# Patient Record
Sex: Male | Born: 1945 | Race: White | Hispanic: No | Marital: Married | Smoking: Former smoker
Health system: Southern US, Community
[De-identification: ages and names within clinical notes are randomized; demographics above are authoritative.]

## PROBLEM LIST (undated history)

## (undated) DIAGNOSIS — E291 Testicular hypofunction: Secondary | ICD-10-CM

## (undated) DIAGNOSIS — J449 Chronic obstructive pulmonary disease, unspecified: Secondary | ICD-10-CM

## (undated) DIAGNOSIS — K31819 Angiodysplasia of stomach and duodenum without bleeding: Secondary | ICD-10-CM

## (undated) DIAGNOSIS — R195 Other fecal abnormalities: Secondary | ICD-10-CM

## (undated) DIAGNOSIS — K559 Vascular disorder of intestine, unspecified: Secondary | ICD-10-CM

## (undated) DIAGNOSIS — N2889 Other specified disorders of kidney and ureter: Secondary | ICD-10-CM

## (undated) DIAGNOSIS — H9190 Unspecified hearing loss, unspecified ear: Secondary | ICD-10-CM

## (undated) DIAGNOSIS — I259 Chronic ischemic heart disease, unspecified: Secondary | ICD-10-CM

## (undated) DIAGNOSIS — E669 Obesity, unspecified: Secondary | ICD-10-CM

## (undated) DIAGNOSIS — D649 Anemia, unspecified: Secondary | ICD-10-CM

## (undated) DIAGNOSIS — I4821 Permanent atrial fibrillation: Secondary | ICD-10-CM

## (undated) DIAGNOSIS — D122 Benign neoplasm of ascending colon: Secondary | ICD-10-CM

## (undated) DIAGNOSIS — J189 Pneumonia, unspecified organism: Secondary | ICD-10-CM

## (undated) DIAGNOSIS — Z950 Presence of cardiac pacemaker: Secondary | ICD-10-CM

## (undated) DIAGNOSIS — I2510 Atherosclerotic heart disease of native coronary artery without angina pectoris: Secondary | ICD-10-CM

## (undated) DIAGNOSIS — N289 Disorder of kidney and ureter, unspecified: Secondary | ICD-10-CM

## (undated) DIAGNOSIS — G4730 Sleep apnea, unspecified: Secondary | ICD-10-CM

## (undated) DIAGNOSIS — E042 Nontoxic multinodular goiter: Secondary | ICD-10-CM

## (undated) DIAGNOSIS — E782 Mixed hyperlipidemia: Secondary | ICD-10-CM

## (undated) DIAGNOSIS — I10 Essential (primary) hypertension: Secondary | ICD-10-CM

## (undated) DIAGNOSIS — M1990 Unspecified osteoarthritis, unspecified site: Secondary | ICD-10-CM

## (undated) DIAGNOSIS — H269 Unspecified cataract: Secondary | ICD-10-CM

## (undated) DIAGNOSIS — K279 Peptic ulcer, site unspecified, unspecified as acute or chronic, without hemorrhage or perforation: Secondary | ICD-10-CM

## (undated) DIAGNOSIS — K5520 Angiodysplasia of colon without hemorrhage: Secondary | ICD-10-CM

## (undated) HISTORY — PX: SHOULDER SURGERY: SHX246

## (undated) HISTORY — DX: Chronic obstructive pulmonary disease, unspecified: J44.9

## (undated) HISTORY — DX: Disorder of kidney and ureter, unspecified: N28.9

## (undated) HISTORY — DX: Atherosclerotic heart disease of native coronary artery without angina pectoris: I25.10

## (undated) HISTORY — DX: Testicular hypofunction: E29.1

## (undated) HISTORY — PX: EYE SURGERY: SHX253

## (undated) HISTORY — DX: Benign neoplasm of ascending colon: D12.2

## (undated) HISTORY — PX: CHOLECYSTECTOMY: SHX55

## (undated) HISTORY — DX: Angiodysplasia of stomach and duodenum without bleeding: K31.819

## (undated) HISTORY — PX: ESOPHAGOGASTRODUODENOSCOPY: SHX1529

## (undated) HISTORY — DX: Other fecal abnormalities: R19.5

## (undated) HISTORY — DX: Other specified disorders of kidney and ureter: N28.89

## (undated) HISTORY — DX: Obesity, unspecified: E66.9

## (undated) HISTORY — DX: Essential (primary) hypertension: I10

## (undated) HISTORY — DX: Angiodysplasia of colon without hemorrhage: K55.20

## (undated) HISTORY — DX: Nontoxic multinodular goiter: E04.2

## (undated) HISTORY — DX: Mixed hyperlipidemia: E78.2

## (undated) HISTORY — DX: Peptic ulcer, site unspecified, unspecified as acute or chronic, without hemorrhage or perforation: K27.9

## (undated) HISTORY — PX: KNEE DEBRIDEMENT: SHX1894

## (undated) HISTORY — DX: Vascular disorder of intestine, unspecified: K55.9

## (undated) HISTORY — DX: Chronic ischemic heart disease, unspecified: I25.9

## (undated) HISTORY — PX: TONSILECTOMY, ADENOIDECTOMY, BILATERAL MYRINGOTOMY AND TUBES: SHX2538

## (undated) HISTORY — PX: WISDOM TOOTH EXTRACTION: SHX21

## (undated) HISTORY — DX: Anemia, unspecified: D64.9

## (undated) HISTORY — DX: Permanent atrial fibrillation: I48.21

---

## 2005-12-24 HISTORY — PX: CORONARY ARTERY BYPASS GRAFT: SHX141

## 2007-01-20 ENCOUNTER — Inpatient Hospital Stay (HOSPITAL_BASED_OUTPATIENT_CLINIC_OR_DEPARTMENT_OTHER): Admission: RE | Admit: 2007-01-20 | Discharge: 2007-01-20 | Payer: Self-pay | Admitting: Cardiology

## 2007-01-30 ENCOUNTER — Ambulatory Visit: Payer: Self-pay | Admitting: Cardiothoracic Surgery

## 2007-02-04 ENCOUNTER — Inpatient Hospital Stay (HOSPITAL_COMMUNITY): Admission: RE | Admit: 2007-02-04 | Discharge: 2007-02-11 | Payer: Self-pay | Admitting: Cardiothoracic Surgery

## 2007-02-04 ENCOUNTER — Ambulatory Visit: Payer: Self-pay | Admitting: Cardiothoracic Surgery

## 2007-02-24 ENCOUNTER — Emergency Department (HOSPITAL_COMMUNITY): Admission: EM | Admit: 2007-02-24 | Discharge: 2007-02-24 | Payer: Self-pay | Admitting: Emergency Medicine

## 2007-03-05 ENCOUNTER — Encounter: Admission: RE | Admit: 2007-03-05 | Discharge: 2007-03-05 | Payer: Self-pay | Admitting: Cardiothoracic Surgery

## 2007-03-05 ENCOUNTER — Ambulatory Visit: Payer: Self-pay | Admitting: Cardiothoracic Surgery

## 2008-01-21 ENCOUNTER — Ambulatory Visit: Payer: Self-pay | Admitting: Cardiothoracic Surgery

## 2008-01-28 ENCOUNTER — Observation Stay (HOSPITAL_COMMUNITY): Admission: AD | Admit: 2008-01-28 | Discharge: 2008-01-29 | Payer: Self-pay | Admitting: Cardiothoracic Surgery

## 2008-01-28 ENCOUNTER — Ambulatory Visit: Payer: Self-pay | Admitting: Cardiothoracic Surgery

## 2008-02-19 ENCOUNTER — Ambulatory Visit: Payer: Self-pay | Admitting: Cardiothoracic Surgery

## 2010-05-04 ENCOUNTER — Ambulatory Visit: Payer: Self-pay | Admitting: Cardiovascular Disease

## 2010-05-18 ENCOUNTER — Telehealth: Payer: Self-pay | Admitting: Cardiovascular Disease

## 2010-05-23 ENCOUNTER — Telehealth: Payer: Self-pay | Admitting: Cardiovascular Disease

## 2010-05-30 ENCOUNTER — Telehealth: Payer: Self-pay | Admitting: Cardiovascular Disease

## 2010-08-10 ENCOUNTER — Telehealth (INDEPENDENT_AMBULATORY_CARE_PROVIDER_SITE_OTHER): Payer: Self-pay | Admitting: *Deleted

## 2010-08-10 ENCOUNTER — Ambulatory Visit: Payer: Self-pay | Admitting: Cardiovascular Disease

## 2010-08-14 ENCOUNTER — Telehealth (INDEPENDENT_AMBULATORY_CARE_PROVIDER_SITE_OTHER): Payer: Self-pay | Admitting: *Deleted

## 2010-08-15 ENCOUNTER — Ambulatory Visit: Payer: Self-pay

## 2010-08-15 ENCOUNTER — Encounter (INDEPENDENT_AMBULATORY_CARE_PROVIDER_SITE_OTHER): Payer: Self-pay | Admitting: *Deleted

## 2010-08-15 ENCOUNTER — Encounter: Payer: Self-pay | Admitting: Cardiovascular Disease

## 2010-08-15 ENCOUNTER — Ambulatory Visit: Payer: Self-pay | Admitting: Cardiovascular Disease

## 2010-08-15 ENCOUNTER — Encounter (HOSPITAL_COMMUNITY): Admission: RE | Admit: 2010-08-15 | Discharge: 2010-09-14 | Payer: Self-pay | Admitting: Dermatopathology

## 2010-10-25 ENCOUNTER — Telehealth (INDEPENDENT_AMBULATORY_CARE_PROVIDER_SITE_OTHER): Payer: Self-pay | Admitting: *Deleted

## 2011-01-23 NOTE — Assessment & Plan Note (Signed)
Summary: Cardiology Nuclear Testing  Nuclear Med Background Indications for Stress Test: Evaluation for Ischemia, Graft Patency   History: CABG, Heart Catheterization, Myocardial Perfusion Study  History Comments: '08 [id number], inferior ischemia>CABG x 4  Symptoms: DOE, Palpitations    Nuclear Pre-Procedure Cardiac Risk Factors: History of Smoking, Hypertension, Lipids, Obesity Caffeine/Decaff Intake: None NPO After: 8:00 PM Lungs: Clear IV 0.9% NS with Angio Cath: 22g     IV Site: (R) AC IV Started by: Irven Baltimore, RN Chest Size (in) 52     Height (in): 72 Weight (lb): 258 BMI: 35.12 Tech Comments: Held metoprolol x 24 hours.  Nuclear Med Study 1 or 2 day study:  1 day     Stress Test Type:  Stress Reading MD:  Jenkins Rouge, MD     Referring MD:  Annia Belt, MD Resting Radionuclide:  Technetium 4mTetrofosmin     Resting Radionuclide Dose:  10.8 mCi  Stress Radionuclide:  Technetium 91metrofosmin     Stress Radionuclide Dose:  33 mCi   Stress Protocol Exercise Time (min):  6:00 min     Max HR:  133 bpm     Predicted Max HR:  15353pm  Max Systolic BP: 23912m Hg     Percent Max HR:  85.26 %     METS: 6.6 Rate Pressure Product:  [id number]  Stress Test Technologist:  ShValetta FullerCMA-N     Nuclear Technologist:  StCharlton AmorCNMT  Rest Procedure  Myocardial perfusion imaging was performed at rest 45 minutes following the intravenous administration of Technetium 9952mtrofosmin.  Stress Procedure  The patient exercised for six minutes.  The patient stopped due to significant DOE with a peak O2 Sat of 98%.  He  denied any chest pain.  There were ST-T wave changes, nonsustained V-tach and occasional PVC's with couplets.  He also had a hypertensive response to exercise, 231/88.  Technetium 57m43mrofosmin was injected at peak exercise and myocardial perfusion imaging was performed after a brief delay.  QPS Raw Data Images:  Normal; no motion artifact; normal  heart/lung ratio. Stress Images:  Normal homogeneous uptake in all areas of the myocardium. Rest Images:  Normal homogeneous uptake in all areas of the myocardium. Subtraction (SDS):  Normal Transient Ischemic Dilatation:  0.93  (Normal <1.22)  Lung/Heart Ratio:  0.46  (Normal <0.45)  Quantitative Gated Spect Images QGS EDV:  98 ml QGS ESV:  44 ml QGS EF:  55 % QGS cine images:  normal  Findings Normal nuclear study      Overall Impression  Exercise Capacity: Poor exercise capacity. BP Response: Hypertensive blood pressure response. Clinical Symptoms: Dysnpnea ECG Impression: 1mm 59mloping ST segment changes in lateral leads not thought to be significant Overall Impression: Normal perfusion.  HTN

## 2011-01-23 NOTE — Progress Notes (Signed)
  Stress Test faxed to Christy/Dr.Redding's Office @ [phone] [facility]  October 25, 2010 11:34 AM

## 2011-01-23 NOTE — Progress Notes (Signed)
  Phone Note Call from Patient   Caller: Patient Summary of Call: Patient stopped by office-requested Viagra 100mg  pills instead of 50mg .  States the 50mg  does not do anything.  Uses [facility] in [place]. Initial call taken by: Dessie Coma,  May 23, 2010 1:56 PM

## 2011-01-23 NOTE — Progress Notes (Signed)
  Phone Note Call from Patient   Summary of Call: Patient stopped by office after receiving a message on answering machine.  Spoke with Dr. Freida Busman, potassium level high.  Needs to have repeat BMP done.  Advised to stop taking the additional potassium supplement.

## 2011-01-23 NOTE — Progress Notes (Signed)
----   Converted from flag ---- ---- 08/10/2010 10:49 AM, Charm Rings, CMA, AAMA wrote: Pt has Medicare and [employer], no precert required.  ---- 08/10/2010 10:11 AM, Rhett Bannister  LPN wrote: Exercise Nuclear scheduled for Tuesday 08/15/10 at [facility]. with Dx: 786.09.  Thanks, Anderson Malta ------------------------------

## 2011-01-23 NOTE — Progress Notes (Signed)
  Phone Note Outgoing Call   Call placed by: Dessie Coma,  May 30, 2010 2:24 PM Call placed to: Patient Summary of Call: Patient notified per Dr. Freida Busman, labs showed improvement in kidney function compared to last time. Initial call taken by: Dessie Coma,  May 30, 2010 2:25 PM

## 2011-01-23 NOTE — Progress Notes (Signed)
Summary: Nuclear pre procedure  Phone Note Outgoing Call Call back at [facility] Phone [phone]   Call placed by: Valetta Fuller, [place],  August 14, 2010 4:21 PM Call placed to: Patient Summary of Call: Reviewed information on [employer] Information Sheet (see scanned document for further details).  Spoke with patient's wife.      Nuclear Med Background Indications for Stress Test: Evaluation for Ischemia, Graft Patency   History: CABG, Heart Catheterization, Myocardial Perfusion Study  History Comments: '08 [id number], inferior ischemia>CABG x 4  Symptoms: DOE    Nuclear Pre-Procedure Cardiac Risk Factors: Hypertension, Lipids

## 2011-01-23 NOTE — Letter (Signed)
Summary: Outpatient Coinsurance Notice  Outpatient Coinsurance Notice   Imported By: Marylou Mccoy 08/25/2010 10:41:28  _____________________________________________________________________  External Attachment:    Type:   Image     Comment:   External Document

## 2011-02-08 ENCOUNTER — Ambulatory Visit (INDEPENDENT_AMBULATORY_CARE_PROVIDER_SITE_OTHER): Payer: Medicare Other | Admitting: Cardiovascular Disease

## 2011-02-08 DIAGNOSIS — E7800 Pure hypercholesterolemia, unspecified: Secondary | ICD-10-CM

## 2011-02-08 DIAGNOSIS — I2510 Atherosclerotic heart disease of native coronary artery without angina pectoris: Secondary | ICD-10-CM

## 2011-02-08 DIAGNOSIS — I10 Essential (primary) hypertension: Secondary | ICD-10-CM

## 2011-02-16 DIAGNOSIS — Z0279 Encounter for issue of other medical certificate: Secondary | ICD-10-CM

## 2011-03-16 NOTE — Assessment & Plan Note (Signed)
[facility] OFFICE NOTE  Damione, [place] MAXIMILIEN HAYASHI                       MRN:          [id number] DATE:02/08/2011                            DOB:          1946-04-22   Mr. Luczak is a 65 year old gentleman who is here today for a followup visit.  He has the following problem list:  1. Coronary artery disease status post 4-vessel coronary artery bypass     graft surgery in 2007. 2. Hypertension. 3. Recently diagnosed type 2 diabetes. 4. Hyperlipidemia. 5. Obesity. 6. Erectile dysfunction.  INTERVAL HISTORY:  Mr. Beeney has been doing reasonably well.  Since his last visit, he was diagnosed with type 2 diabetes when he presented with polydipsia.  He was since started on a glipizide and a metformin.  He has been exercising more than before.  He goes to the [facility] 3 times a week, where he does treadmill, swimming, and weightlifting.  He has not had any recent chest pain.  His dyspnea and exercise capacity has improved significantly over the last 6 months.  PHYSICAL EXAMINATION:  VITAL SIGNS:  Weight is 252.4 pounds, blood pressure is 140/82, pulse is 50, oxygen saturation is 98% on room air. NECK:  Reveals no JVD or carotid bruits. LUNGS:  Clear to auscultation. HEART:  Regular rate and rhythm with no gallops or murmurs. ABDOMEN:  Benign, nontender, nondistended. EXTREMITIES:  With no clubbing, cyanosis, or edema.  IMPRESSION: 1. Coronary artery disease status post CABG in 2007:  He is currently     not having any symptoms of angina, heart failure, or arrhythmia.     His most recent stress test was done in August 2011.  Overall, it     was negative for ischemia with an ejection fraction of 55%.  I will     continue with medical therapy at this time. 2. Hypertension:  Blood pressure is reasonably controlled.  He is     being treated with metoprolol 50 mg twice daily,     hydrochlorothiazide 25 mg once daily, and  amlodipine 5 mg once     daily.  He is not on an ACE inhibitor as it caused worsening of     renal function. 3. Hyperlipidemia:  We will continue with pravastatin as well as     Zetia, he is on 80 mg of pravastatin.  His most recent lipid     profile was acceptable except for low HDL.  Hopefully,     this will improve with exercise.  I will plan on repeating his     lipid profile in 6 months.  I will     consider stopping these 2 medications and switching to 1 potent     statin instead such as simvastatin or atorvastatin.  The patient     will follow up in 6 months or earlier if needed.    Kathlyn Sacramento, MD Electronically Signed   MA/MedQ  DD: 02/08/2011  DT: 02/08/2011  Job #: [id number]

## 2011-04-25 ENCOUNTER — Other Ambulatory Visit: Payer: Self-pay | Admitting: *Deleted

## 2011-05-07 ENCOUNTER — Other Ambulatory Visit: Payer: Self-pay | Admitting: *Deleted

## 2011-05-07 MED ORDER — SILDENAFIL CITRATE 50 MG PO TABS: 50.0000 mg | ORAL_TABLET | Freq: Every day | ORAL | Status: DC | PRN

## 2011-05-08 NOTE — Assessment & Plan Note (Signed)
OFFICE VISIT   Lawrence, [name]  DOB:  July 04, 1946                                        February 19, 2008  CHART #:  [id number]   The patient returns to the office today in follow-up after his recent  incisional/ventral hernia repair, status post coronary artery bypass  grafting done on January 29, 2008.  He has done extremely well  postoperatively.  He is very pleased with the result.  He notes there is  marked decrease in tenderness.  He notes that he is sleeping better and  has not needed any pain medication.   PHYSICAL EXAMINATION:  VITAL SIGNS:  Blood pressure 155/97, he did not  take his blood pressure medicine this morning.  Pulse 67, respiratory  rate 18, O2 saturations 98%.  CHEST:  His sternum is healed.  The new incision over the lower portion  of his sternum is completely healed.  In fact it is almost  indistinguishable from his previous sternotomy incision.  On palpation,  the repair is intact.  There is no palpable fascial defect or evidence  of reherniation.   Overall, he is making very good progress postoperatively.  I have warned  him about any heavy lifting for at least several months and also  reminded him to make sure he takes his blood pressure medication as  indicated.  He notes that he is to see Lawrence [name], M.D. in early  June.  I have not made him a return appointment to see, but would be  glad to see him at Dr. Thurman [name] request or p.r.n.   Lanelle Bal, MD  Electronically Signed   EG/MEDQ  D:  02/19/2008  T:  02/20/2008  Job:  [id number]   cc:   Lawrence [name], M.D.

## 2011-05-08 NOTE — Assessment & Plan Note (Signed)
[facility] OFFICE NOTE   Lawrence, Husmann LAJUAN [name]                       MRN:          [id number]  DATE:08/10/2010                            DOB:          November 03, 1946    This is a followup visit.   PROBLEM LIST:  1. Coronary artery disease status post four-vessel coronary artery      bypass graft in 2007.  2. Hypertension.  3. Hyperlipidemia.  4. Obesity.  5. Erectile dysfunction.   INTERIM HISTORY:  [name] is here today for a followup visit.  Overall, he is doing reasonably well.  He denies any chest pain.  However, has been having episodes of exertional dyspnea which happens  with the moderate activities.  He gets occasional palpitations.  There  is no dizziness, presyncope, or syncope.  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  He  has been taking his medications as prescribed.   Medications include:  1. Prilosec 40 mg twice daily.  2. Zetia 10 mg daily.  3. Pravastatin, he is taking both 10 mg at bedtime and 80 mg at      bedtime.  I asked him to stop that 10 mg.  4. Spiriva once daily.  5. Aspirin 81 mg daily.  6. Fish oil 1000 mg once daily.  7. Testosterone.  8. Metoprolol 50 mg twice daily.  9. Hydrochlorothiazide 25 mg once daily.  10.Amlodipine 5 mg once daily.  11.Nitroglycerin as needed which he has not used.  12.Viagra as needed.   ALLERGIES:  No known drug allergies.   PHYSICAL EXAMINATION:  VITAL SIGNS:  Weight is 260.6 pounds, blood  pressure is 138/81, pulse is 55, oxygen saturation is 94% on room air.  NECK:  No JVD or carotid bruits.  LUNGS:  Clear to auscultation.  HEART:  Regular rate and rhythm with no gallops or murmurs.  ABDOMEN:  Benign, nontender, nondistended.  EXTREMITIES:  With no clubbing, cyanosis, or edema.   IMPRESSION:  1. Coronary artery disease status post coronary artery bypass surgery      in 2007.   Currently, he is having symptoms of exertional dyspnea      and will need to rule out underlying ischemia.  Interestingly, he      never had any chest pain at all even before his bypass surgery.      Most of his symptoms were fatigue and dyspnea.  We will thus      proceed with treadmill nuclear stress test.  In the meanwhile, we      will continue with aggressive treatment of risk factors with      aspirin, metoprolol, ACE inhibitor, and pravastatin.  2. Hypertension.  Blood pressure is better controlled today.  He was      actually taken off ramipril due to worsening renal function.  We      will continue with metoprolol, hydrochlorothiazide, and amlodipine.      His most recent creatinine was 1.34 which is an improvement from      before.  3. Hyperlipidemia.  Most recent lipid profile showed a total      cholesterol of 103, HDL of 33, triglyceride 93, and then LDL of 51.      His LDL is at target, however, HDL is low.  I asked him to increase      his exercise.  4. Erectile dysfunction.  Will continue with Viagra 100 mg as needed.      If he does not get good results with this, we might consider      switching him to another medication.     Lawrence Sacramento, MD  Electronically Signed    MA/MedQ  DD: 08/10/2010  DT: 08/11/2010  Job #: [id number]

## 2011-05-08 NOTE — Letter (Signed)
May 04, 2010    Lovette Cliche, M.D.  [address]  [place], [place] [address]   RE:  Lawrence, [name]  MRN:  [id number]  /  DOB:  [birth year]   Dear Dr. Lin Landsman:   I had the pleasure of seeing Lawrence [name] in clinic this morning.  As you  know he is a 65 year old male with a history of coronary artery disease  status post four-vessel bypass surgery in 2007.  The patient is doing  reasonably well from a cardiovascular standpoint.  He is not having any  symptoms consistent with angina, heart failure or arrhythmia.  Unfortunately he has had struggles with his weight and remains obese.  His blood pressure is also elevated.   Today in clinic I have added hydrochlorothiazide 25 mg daily to his  medical regimen.  I will follow up with a BMP and fasting lipid profile  in one weeks' time.  I have encouraged him and advised him on dietary  modifications for weight loss, glucose and cholesterol control.  The  patient also endorses some issues with maintaining an erection.  He has  not used his nitroglycerin since the bypass surgery and is currently not  having any symptoms consistent with angina.  For this reason I think it  reasonably safe to cautiously continue Viagra use, and I have given him  a prescription for Viagra 50 mg to use p.r.n.  He is aware that he  should call 9-1-1 if he develops any chest discomfort during sexual  activity.  I thank you for the referral of this patient and I look  forward to following him along with you.    Sincerely,     Arlee Muslim, MD  Electronically Signed   SGA/MedQ  DD: 05/04/2010  DT: 05/04/2010  Job #: [id number]

## 2011-05-08 NOTE — Op Note (Signed)
NAME:  Lawrence [name], Lawrence [name] NO.:  [id number]   MEDICAL RECORD NO.:  [id number]          PATIENT TYPE:  INP   LOCATION:  2041                         FACILITY:  [facility]   PHYSICIAN:  Lanelle Bal, MD    DATE OF BIRTH:  [birth year]   DATE OF PROCEDURE:  01/28/2008  DATE OF DISCHARGE:  01/29/2008                               OPERATIVE REPORT   PREOPERATIVE DIAGNOSIS:  Incisional/ventral hernia status post coronary  artery bypass grafting.   POSTOPERATIVE DIAGNOSIS:  Incisional/ventral hernia status post coronary  artery bypass grafting.   PROCEDURE:  Repair of incisional hernia with Marlex mesh repair.   SURGEON:  Lanelle Bal, MD   FIRST ASSISTANT:  Darlin Coco, [place]   BRIEF HISTORY:  The patient is a 65 year old male who approximately one  year previously had undergone coronary artery bypass grafting.  Over the  past several months, he had noticed increasing bulge at the lower end of  his sternal incision.  On physical examination, he had an easily  reducible ventral/incisional hernia at the lower pole of the sternal  incision with an easily palpable fascial defect approximately 2 cm.  The  patient had noticed that over several months that he had noticed that it  had been gradually enlarging, and it was becoming uncomfortable for him.  Surgical repair was recommended.  The patient agreed and signed informed  consent.   DESCRIPTION OF PROCEDURE:  The patient underwent general endotracheal  anesthesia without incident.  The skin of the lower chest and upper  abdomen was prepped with Betadine and draped in the usual sterile  manner.  A lower portion of the old incision was excised, and dissection  was carried down through the subcutaneous tissue to the fascial defect.  Obviously, there was no bowel content in the defect.  An identifiable  fascial plane was developed, and then using a portion of Marlex graft  folded to make four layers fit the defect well.   Horizontal mattress 2-0  interrupted Prolene sutures were placed circumferentially around the  fascial defect and through the fascia and secured in place with the  defect closed with mesh.  Overlying fascia was then reapproximated with  interrupted 0 Vicryl sutures.  Subcutaneous tissue closed with running 2-  0 Vicryl and a 3-0 subcuticular stitch in the skin edges.  Dry dressings  were applied.  The patient was awakened and extubated in the operating  room and transferred to the recovery room for postoperative care.  He  tolerated the procedure without complications.  Sponge and needle counts  were reported as correct.  Blood loss was minimal.  No obvious  complications.      Lanelle Bal, MD  Electronically Signed     EG/MEDQ  D:  01/29/2008  T:  01/30/2008  Job:  [id number]   cc:   Ezzard Standing, M.D.

## 2011-05-08 NOTE — Assessment & Plan Note (Signed)
[facility] OFFICE NOTE   Lawrence [name]                       MRN:          [id number]  DATE:05/04/2010                            DOB:          December 15, 1946    CHIEF COMPLAINT:  Coronary artery disease, establishing cardiovascular  care.   HISTORY OF PRESENT ILLNESS:  The patient is a 65 year old white male  with past medical history significant for coronary artery disease status  post four-vessel CABG in 2007, hypertension, hyperlipidemia, obesity who  is presenting to establish cardiovascular care.  The patient states that  since his bypass surgery, he has been getting along quite well.  He has  been struggling with some weight issues but has remained very active.  He denies any episodes of chest discomfort, shortness of breath, change  in dyspnea on exertion or lower extremity edema.  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  He is compliant with his  medications.   PAST MEDICAL HISTORY:  As above in HPI.   SOCIAL HISTORY:  History of tobacco use, but no longer smokes.  Does not  drink significant amount of alcohol.   FAMILY HISTORY:  Negative for premature coronary artery disease.   ALLERGIES:  No known drug allergies.   MEDICATIONS:  1. Aspirin 81 mg daily.  2. Metoprolol succinate 25 b.i.d.  3. Ramipril 10 mg b.i.d.  4. Zetia 10 mg daily.  5. Pravastatin 10 mg every evening.  6. Prilosec 20 mg b.i.d.  7. Fish oil 1000 mg daily.  8. Testosterone.  9. He has prescription for nitroglycerin, but has not had to take it      since his bypass surgery.  10.He also has recently been taking Viagra p.r.n.   REVIEW OF SYSTEMS:  Positive for difficulty in maintaining erection,  although he does state he has a desire and is able to achieve an  erection.  The patient also states that he has been losing inches off  his waist, but has not lost significant amount of  weight.   REVIEW OF SYSTEMS:  As in HPI, otherwise negative.   PHYSICAL EXAMINATION:  VITAL SIGNS:  Blood pressure is 147/87, rechecked  is 149/80; pulse is 64; he weighs 263 pounds; and he is satting 96% on  room air.  GENERAL:  No acute distress.  HEENT:  Normocephalic, atraumatic.  NECK:  Supple.  There is no JVD.  No carotid bruits.  HEART:  Regular rate and rhythm without murmur, rub or gallop.  LUNGS:  Clear bilaterally.  ABDOMEN:  Soft, nontender, nondistended.  There are no bruits  auscultated.  EXTREMITIES:  Without edema.  SKIN:  Warm and dry.  MUSCULOSKELETAL:  5/5 bilateral upper and lower extremity strength.  NEURO:  Nonfocal.   EKG taken today in the office independently reviewed by myself  demonstrates normal sinus rhythm without any significant ST or T-wave  abnormalities.  Review of the patient's recent lab work dated May 4; BMP  showed a sodium of 144, potassium of 4.7, chloride of 104, CO2 of 28,  BUN 24,  creatinine 1.2, glucose 109, white count of 11, hemoglobin 15.6,  hematocrit 46, platelet count 220.   ASSESSMENT AND PLAN:  1. Coronary artery disease.  The patient is not having any symptoms      consistent with angina, heart failure or arrhythmia.  He should      continue on aspirin, beta-blocker, statin, and fish oil.  2. Hyperlipidemia.  We do not have a current fasting lipid profile.      We will order this today.  For now, he should continue on the      pravastatin and Zetia.  3. Hypertension.  The patient's blood pressure is not yet at goal.      Today, we will institute therapy with hydrochlorothiazide 25 mg      daily in addition to the ramipril and metoprolol.  The patient is      encouraged to contact our office if he cannot tolerate the      hydrochlorothiazide for any reason, at which point we can try      calcium channel blocker.  4. Erectile dysfunction.  The patient has not need to take a      nitroglycerin in the past.  He was taking the  Viagra with success      and we will give him a prescription today for a 50 mg tablet.  He      is reminded that this cannot be taken with nitroglycerin that he      were to experience any chest discomfort during sexual activity that      he should call 911.  5. History of tobacco use.  The patient currently no longer uses      tobacco.  At the next visit, we will discuss a one-time screening      ultrasound for abdominal aortic aneurysm.     Arlee Muslim, MD  Electronically Signed    SGA/MedQ  DD: 05/04/2010  DT: 05/05/2010  Job #: [id number]
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
## 2011-05-11 NOTE — Op Note (Signed)
NAME:  Lawrence [name], Lawrence [name] NO.:  [id number]   MEDICAL RECORD NO.:  [id number]          PATIENT TYPE:  INP   LOCATION:  2303                         FACILITY:  [facility]   PHYSICIAN:  Lanelle Bal, MD    DATE OF BIRTH:  17-Apr-1946   DATE OF PROCEDURE:  02/04/2007  DATE OF DISCHARGE:                               OPERATIVE REPORT   PREOPERATIVE DIAGNOSIS:  Coronary occlusive disease.   POSTOPERATIVE DIAGNOSIS:  Coronary occlusive disease.   SURGICAL PROCEDURE:  Coronary artery bypass grafting x4 with a left  internal mammary artery to the left anterior descending coronary artery,  reverse saphenous vein graft to the diagonal coronary artery, reverse  saphenous vein graft to the circumflex, reverse saphenous vein graft to  the posterior descending with right leg endo vein harvesting.   SURGEON:  Lanelle Bal, MD   FIRST ASSISTANT:  Ivin Poot, M.D.   SECOND ASSISTANT:  Suzzanne Cloud, P.A.   BRIEF HISTORY:  Patient is a 65 year old who is followed by Dr. Tollie Eth, who underwent cardiac catheterization.  Was found to have  significant three vessel coronary artery disease, including total  occlusion of his right coronary artery, high grade stenosis of the LAD,  and 50-60% stenosis of the first circumflex.  He also had disease of the  diagonal, greater than 80%.  Because of this, coronary artery bypass  grafting was recommended.  The patient agreed and signed informed  consent.   DESCRIPTION OF PROCEDURE:  With Swan-Ganz and arterial line monitoring  placed, the patient underwent general endotracheal anesthesia without  incidence.  The skin of the chest and legs was prepped with Betadine and  draped in the usual sterile manner.  Using the [employer] endo vein  harvesting system, a vein was harvested from the right thigh and calf.  The left internal mammary artery was dissected down as a pedicle graft.  The distal artery was divided, had good free flow.   The pericardium was  opened.  Overall ventricular function appeared preserved.  The patient  was systemically heparinized.  The ascending aorta and the right atrium  were cannulated.  An aortic root bent cardioplegia needle was introduced  into the ascending aorta.  The patient was placed on cardiopulmonary  bypass at 2.4 L/min/m2.  Sites of anastomosis were selected and  dissected down to the epicardium.  The patient's body temperature was  cooled to 38 degrees.  An aortic cross clamp was applied, and 500 cc of  cold blood potassium cardioplegia was administered with rapid diastolic  arrest of the heart.  Myocardial septal temperature was monitored  throughout the cross clamp.   Attention was turned first to the posterior descending coronary artery,  which was opened and admitted a 1.5 mm probe.  Using running 7-0  Prolene, distal anastomosis was performed.  Attention was then turned to  the circumflex coronary artery, which was opened and admitted a 1.5 mm  probe.  Using a running 8-0 Prolene, the left internal mammary artery  was anastomosed to the left anterior descending coronary artery.  Attention was then turned  to the diagonal coronary artery, which was  opened and admitted a 1 mm probe distally.  Using a running 7-0 Prolene,  distal anastomosis was performed with a segment of reverse saphenous  vein graft.  Attention was turned to the left anterior descending  coronary artery, between the mid and distal third, a vessel was opened  using a running 8-0 Prolene, the left internal mammary artery was  anastomosed to the left anterior descending coronary artery.  With  release of the Edwards bulldog on the mammary artery, there was rise in  myocardial septal temperature.  The bulldog was placed back on the  mammary artery.  Additional cold blood cardioplegia was administered.  With an aortic cross clamp still in place, three punch aortotomies were  performed.  Each of the three vein  grafts were anastomosed to the  ascending aorta.  Air was evacuated from the grafts, and the aortic  cross clamp was removed.  Total cross clamp time was 77 minutes.  The  patient spontaneously converted to a sinus rhythm.  He did require  atrial pacing to increase the rate.  With the body temperature rewarmed  to 37 degrees, he was then ventilated and weaned from cardiopulmonary  bypass without difficulty.  He remained hemodynamically stable.  Total  pump time was 111 minutes.  Atrial and ventricular pacing wires were  applied.  __________ applied.  The pericardium was reapproximated.  The  left pleural tube and single mediastinal drain were left in place.  The  sternum was closed with a #6 stainless steel wire.  The fascia was  closed with an interrupted 0 Vicryl, running 3-0 Vicryl to the  subcutaneous tissue, a 4-0 subcuticular stitch in the skin edges.  Dry  dressings were applied.  Sponge and needle count was reported as correct  at the completion of the procedure.  The patient tolerated the procedure  without obvious complications and was transferred to the surgical  intensive care unit for further postoperative care.      Lanelle Bal, MD  Electronically Signed     EG/MEDQ  D:  02/05/2007  T:  02/05/2007  Job:  [id number]   cc:   Ezzard Standing, M.D.

## 2011-05-11 NOTE — Discharge Summary (Signed)
NAME:  Lawrence [name], Lawrence [name] NO.:  [id number]   MEDICAL RECORD NO.:  [id number]          PATIENT TYPE:  INP   LOCATION:  2040                         FACILITY:  [facility]   PHYSICIAN:  Lanelle Bal, MD    DATE OF BIRTH:  02-13-1946   DATE OF ADMISSION:  02/04/2007  DATE OF DISCHARGE:  02/11/2007                               DISCHARGE SUMMARY   __________ status post coronary artery bypass graft.  Postoperative __________  Paroxysmal atrial fibrillation with rapid ventricular response.  __________.  Hyperlipidemia.  Hypertension.  Peptic ulcer disease.  History of obesity.  Post traumatic stress disorder.  History of tobacco abuse.  Postoperative leukocytosis, negative urine culture (work up still  pending at the time of dictation)  Postoperative acute blood loss anemia.  Postoperative respiratory and metabolic acidosis, improved.  Postoperative acute renal insufficiency with peak creatinine of 1.9  improving to creatinine of 1.36 at time of this dictation.   ALLERGIES:  No known drug allergies.   PROCEDURE:  February 04, 2007 coronary artery bypass grafting times four  using left ventricular mammary arteries to the left anterior descending,  reverse saphenous vein graft to the diagonal, first saphenous vein graft  to the circumflex, reverse saphenous vein graft to  the posterior  descending, left leg endoscopic vein harvest surgery by Dr. Lanelle Bal.   BRIEF HISTORY:  The patient is a 65 year old Caucasian male followed by  Dr. Tollie Eth who underwent cardiac catheterization and was found  to have significant 2-vessel coronary artery disease including total  occlusion of his right coronary artery, high grade stenosis at LAD and  50-60% stenosis of the left circumflex.  He also had disease at the  diagonal greater than 80%.  Because of this coronary artery bypass  grafting was recommended.  He was seen by Dr. Lanelle Bal who  discussed with him  the risks, benefits and alternatives.  The patient  agreed to proceed.   HOSPITAL COURSE:  The patient was electively admitted to [facility] on February 04, 2007 and underwent coronary artery bypass graft  surgery as discussed above.  Postoperatively he was transferred to the  surgical intensive care unit in hemodynamically stable condition.  He  did require IV Dopamine and Neo-Synephrine postoperatively.  There was  no obvious postoperative bleeding and chest tube discontinued in the  usual fashion.  Initially, he also required atrial pacing short term.  He was extubated by postoperative day 2 with EKG showing respiratory and  metabolic acidosis which was treated with __________ bicarbonate and a  decrease in his narcotics.  As well as aggressive pulmonary toilet. On  postoperative day 3 he was felt appropriate to transfer out to the floor  as he had been weaned off his Dopamine.  His respiratory effort was also  improving and his creatinine which had been peaked at 1.9  postoperatively, was now stable at 1.4.  He was now off of his external  pacer maintaining normal sinus rhythm.  At the time of dictation the  patient remains on telemetry unit 2000.  He has made  stable progress  although on postoperative day 6 he had 2 freak episodes of atrial  fibrillation with rapid ventricular response up to 140.  The initial  episode was only for about 20 minutes and he was treated with IV  Lopressor.  His second episode lasted for approximately 90 minutes and  again, IV Lopressor was ordered but was not given as the patient  converted to sinus rhythm on his own.  His beta-blocker therapy was  increased at that time.  The patient also required supplemental oxygen  initially but currently saturations have been around 99% on room air.  He still has bibasilar crackles with a chest x-ray showing no  significant change in his small bowel or lateral effusion, aspirated to  the right with  improving aeration.  His chest x-ray prior to that had  showed less basilar atelectasis versus infiltrate with no significant  change from his chest x-ray on February 10, 2007.  He did have results  that were monitored for postoperative leukocytosis with a white count up  to 17.6000.  He had a urine culture which so far is negative.  There is  no obvious signs of infection.  A followup CBC has been ordered.  It has  continued to rise.  Anticipate starting him on a p.o. antibiotic for  questionable left basilar infiltrate.  His other labs have remained  stable although with mild acute blood loss anemia his hemoglobin and  hematocrit were around 9 and 28 but has not required a postoperative  transfusion.  His creatinine has been stable at 1.3.  He has been  tolerating low dose diuretic for postoperative volume access.  At the  time of dictation his weight is still up approximately 6 kg and he has  1+ ankle edema.  Otherwise, his physical exam shows __________  regular  rate and rhythm.  Lung sounds diminished at the bases.  __________. The  incisions are healing without infection.  His bowel and bladder have  been functioning appropriately.  He has been ambulating for cardiac  rehab and has been making steady progress.  His __________ test remained  stable.  We have been monitoring his blood sugars per cardiac surgery  protocol and his sugars have been __________ maintained sinus rhythm.  His leukocytosis is improving. It is anticipated he will be ready for  discharge home in the next 1-2 days on February 19 or 20, 2008.   At the time of dictation the patient has his CBC pending but his most  recent labs show a white blood count elevated at 17.6, hemoglobin 9.9  and hematocrit 27.7.  Platelet count 268, sodium 137, potassium 4.4,  chloride 104, CO2 29, blood glucose  91, BUN 46, creatinine 1.36. Preoperative liver function test showed a total bilirubin of 1.0,  alkaline phosphatase 89, AST  20, ALT 14 with total protein of 6.4,  __________  3.6.   DISCHARGE MEDICATIONS:  1. Aspirin 325 mg p.o. daily.  2. Lopressor 25 mg p.o. b.i.d.  3. Crestor 10 mg p.o. daily.  4. Prilosec 40 mg p.o. daily p.r.n.  5. Lasix 40 mg daily times one week.  6. Potassium chloride 20 mEq p.o. daily times one week.  7. Oxycodone 5 mg 1-2 tablets p.o. q 4 hours p.r.n. pain.   DISCHARGE INSTRUCTIONS:  He is to avoid driving, heavy lifting more than  10 pounds.  He is encouraged to continue to daily walks __________ .  He  is to follow a low  fat, low salt diet.  He may shower and clean his  incision gently with soap and water __________.  He has been given  information on smoking cessation class.  Instructed to call [phone]  for the class.   FOLLOWUP:  1. He is to have a two week followup with Dr. Tollie Eth and      should have a chest x-ray taken at this      appointment as well.  2. He is to followup with Dr. Lanelle Bal as well as he has      followup with __________ appointment day and time.      Jacinta Shoe, P.A.      Lanelle Bal, MD  Electronically Signed    AWZ/MEDQ  D:  02/10/2007  T:  02/11/2007  Job:  [id number]   cc:   Ezzard Standing, M.D.  Lovette Cliche II, M.D.

## 2011-05-11 NOTE — Consult Note (Signed)
NAME:  Lawrence [name], Lawrence [name] NO.:  [id number]   MEDICAL RECORD NO.:  [id number]          PATIENT TYPE:  EMS   LOCATION:  MAJO                         FACILITY:  [facility]   PHYSICIAN:  Ezzard Standing, M.D.DATE OF BIRTH:  1946-02-08   DATE OF CONSULTATION:  02/24/2007  DATE OF DISCHARGE:                                 CONSULTATION   HISTORY:  A 65 year old male who has a history of coronary artery bypass  grafting several weeks ago.  He had some COPD and some respiratory  distress previously.  He had been discharged home and doing well but had  some mild dizziness when he got in the morning.  He notes a weight loss  from 252 to 219 pounds since he went home, having been on Lasix as well  as potassium.  This morning instead of following his usual routine,  where he would get up and sit on the side of the bed and then have some  help from his wife, he had a nosebleed and then went into the bathroom  quickly.  He called his wife who found him somewhat dizzy and he became  dizzy and near syncopal, although he never did completely loose  consciousness.  He was weak and was assisted back to bed and EMS was  called, and he was transported to the emergency room.  He has been  evaluated by the emergency room physician and a set of laboratory data  was done showing a hemoglobin of 11.3, hematocrit of 33.8, a white count  of 13,800.  Sodium is 138, potassium 5.2, BUN is 53, and a creatinine is  2.02.  His albumin is 3.2.  Lipase was 34.  He has not had any chest  pain or shortness of breath.  He has had significant weakness as noted  previously.  He was brought into the hospital, and he was brought to the  emergency room and has been stable since he has been here, although his  pressure has been borderline low.  His most recent pressures have been  629 systolic.   He is currently on aspirin, metoprolol tartrate, Lasix, potassium,  Micardis, and Lipitor.   PHYSICAL EXAMINATION:   GENERAL:  He is a pleasant male who is sitting up  on the bed wearing and is currently in no acute distress.  VITAL SIGNS:  Blood pressure is 110/70, pulse is currently 56 and  regular.  SKIN:  Warm and dry somewhat pale.  LUNGS:  Clear.  CARDIOVASCULAR:  Showed normal S1 S2.  There was no S3.  ABDOMEN:  Soft and nontender.   LABORATORY:  Noted as above.   EKG shows mild diffuse ST elevation consistent with pericarditis.  Chest  x-ray was clear.  Oxygen saturations were 94-99%.   IMPRESSION:  1. Significant weakness likely due to hypotension and maybe somewhat      dehydrated from over diuresis.  2. Renal insufficiency possibly over diuresis.  3. Recent coronary artery bypass grafting.  4. Chronic obstructive pulmonary disease.  5. History of hypertension.   RECOMMENDATIONS:  The patient clinically is better  and his blood  pressure is better.  I would recommend that he stop his furosemide,  potassium, and Micardis at the present time.  He may continue aspirin,  metoprolol, and Lipitor.  I suspect he is over diuresed and recommend  that he be able to go home.  I will see him back in the office in one  week.      Ezzard Standing, M.D.  Electronically Signed     WST/MEDQ  D:  02/24/2007  T:  02/24/2007  Job:  [id number]   cc:   Lanelle Bal, MD

## 2011-05-11 NOTE — Cardiovascular Report (Signed)
NAME:  Lawrence [name], Lawrence [name] NO.:  [id number]   MEDICAL RECORD NO.:  [id number]          PATIENT TYPE:  OIB   LOCATION:  1963                         FACILITY:  [facility]   PHYSICIAN:  Ezzard Standing, M.D.DATE OF BIRTH:  [birth year]   DATE OF PROCEDURE:  01/20/2007  DATE OF DISCHARGE:                            CARDIAC CATHETERIZATION   HISTORY:  A 65 year old male with hypertension, hyperlipidemia who has  had some mild dyspnea.  He presented with severe coronary calcification  on cardiac CT and has an abnormal Cardiolite stress test with reversible  infero and distal anterior ischemia.   PROCEDURE:  Left heart catheterization with coronary angiograms and left  ventriculogram.   COMMENDS ABOUT PROCEDURE:  The procedure was done in the outpatient  diagnostic laboratory using 4-French catheters.  A 30 mL ventriculogram  was performed and he tolerated the procedure well.  The sheath was  removed with good hemostasis following the procedure.   HEMODYNAMIC DATA:  Aorta post contrast 141/70, LV postcontrast 141/10-  15.   ANGIOGRAPHIC DATA:  Left ventriculogram:  Performed in the 30 degrees  RAO projection.  The aortic valve is normal.  The mitral valve is  normal.  The left ventricle appears normal in size.  The estimated  ejection fraction is 55-60%.   CORONARY ARTERIES:  There is severe heavy calcification involving the  proximal LAD and left coronary system as well as the right coronary  artery.  The left main coronary is calcified with a 20% distal stenosis.  The left anterior descending is heavily calcified proximally with  moderate irregularity.  There is an eccentric 70-90% stenosis from the  midportion of the vessel noted.  A large diagonal branch arises and has  an area of severe 70-80% segmental narrowing in the midportion.  The  circumflex has an intermediate branch that has mild irregularities.  There appears to be a 40% midvessel stenosis in the  circumflex.  The  right coronary artery is completely occluded following a large acute  marginal branch.  The acute marginal branch has a subtotal 99% stenosis  with somewhat slow antegrade flow.  The distal vessel of the right  coronary artery fills by collaterals in the left coronary system.   IMPRESSION:  Significant coronary artery disease with severe stenosis in  the proximal LAD after the diagonal, severe diagonal stenosis, occluded  right coronary artery severe stenosis and acute marginal and moderate  stenosis in circumflex.   RECOMMENDATIONS:  Consideration of coronary bypass grafting, strict risk  factor modification.  Will have interventional cardiologist review the  case but suspect with the diffuseness of the disease and the  calcification it would best be served with bypass grafting.      Ezzard Standing, M.D.  Electronically Signed     WST/MEDQ  D:  01/20/2007  T:  01/20/2007  Job:  [id number]   cc:   Neta Ehlers, M.D.
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
## 2011-05-31 ENCOUNTER — Other Ambulatory Visit: Payer: Self-pay | Admitting: *Deleted

## 2011-05-31 MED ORDER — HYDROCHLOROTHIAZIDE 25 MG PO TABS: 25.0000 mg | ORAL_TABLET | Freq: Every day | ORAL | Status: DC

## 2011-06-19 ENCOUNTER — Encounter: Payer: Self-pay | Admitting: Cardiovascular Disease

## 2011-07-16 ENCOUNTER — Encounter: Payer: Self-pay | Admitting: Cardiovascular Disease

## 2011-07-16 ENCOUNTER — Ambulatory Visit (INDEPENDENT_AMBULATORY_CARE_PROVIDER_SITE_OTHER): Payer: Medicare Other | Admitting: Cardiovascular Disease

## 2011-07-16 VITALS — BP 153/77 | HR 52 | Ht 71.0 in | Wt 268.0 lb

## 2011-07-16 DIAGNOSIS — I10 Essential (primary) hypertension: Secondary | ICD-10-CM | POA: Insufficient documentation

## 2011-07-16 DIAGNOSIS — I2510 Atherosclerotic heart disease of native coronary artery without angina pectoris: Secondary | ICD-10-CM

## 2011-07-16 DIAGNOSIS — E782 Mixed hyperlipidemia: Secondary | ICD-10-CM

## 2011-07-16 NOTE — Assessment & Plan Note (Signed)
The patient is stable overall. He has no symptoms suggestive of angina. Continue medical therapy. I discussed with him the importance of controlling his weight.

## 2011-07-16 NOTE — Assessment & Plan Note (Signed)
He is on pravastatin and Zetia. He did not tolerate Crestor due to myalgia. Continue current medications. This is being managed by his primary care physician.

## 2011-07-16 NOTE — Assessment & Plan Note (Signed)
His blood pressure is mildly elevated. This is likely related to his weight gain. We'll continue to monitor this after his thyroid situation is that settled.

## 2011-07-16 NOTE — Progress Notes (Signed)
HPI  This is a 65 year old gentleman who is here today for a followup visit. He has a history of coronary artery disease status post coronary artery bypass graft surgery in 2008. Overall, he has been doing reasonably well from a cardiac standpoint. He denies any chest pain. His dyspnea is stable. He was recently diagnosed with thyroid nodules. He is supposed to have thyroidectomy. He gained significant weight since last visit which he attributes to the thyroid situation. The nodules are not thought to be cancerous.  Not on File   Current Outpatient Prescriptions on File Prior to Visit  Medication Sig Dispense Refill  . amLODipine (NORVASC) 5 MG tablet Take 5 mg by mouth daily.        Marland Kitchen aspirin 81 MG chewable tablet Chew 81 mg by mouth daily.        . celecoxib (CELEBREX) 200 MG capsule Take 200 mg by mouth 2 (two) times daily as needed.        . Coenzyme Q10 (CO Q-10) 200 MG CAPS Take 2 capsules by mouth daily.        Marland Kitchen ezetimibe (ZETIA) 10 MG tablet Take 10 mg by mouth daily.        . fish oil-omega-3 fatty acids 1000 MG capsule Take 1 capsule by mouth daily.        Marland Kitchen gabapentin (NEURONTIN) 300 MG capsule Take 300 mg by mouth 3 (three) times daily.        Marland Kitchen glipiZIDE (GLUCOTROL) 5 MG tablet Take 5 mg by mouth daily.        . hydrochlorothiazide 25 MG tablet Take 1 tablet (25 mg total) by mouth daily.  30 tablet  4  . metFORMIN (GLUCOPHAGE) 500 MG tablet Take 500 mg by mouth 2 (two) times daily with a meal.        . metoprolol (LOPRESSOR) 50 MG tablet Take 50 mg by mouth 2 (two) times daily.        . nitroGLYCERIN (NITROSTAT) 0.4 MG SL tablet Place 0.4 mg under the tongue every 5 (five) minutes as needed.        Marland Kitchen omeprazole (PRILOSEC) 40 MG capsule 1 capsule by mouth two times daily before meals       . orlistat (ALLI) 60 MG capsule Take 60 mg by mouth 2 (two) times daily.        . pravastatin (PRAVACHOL) 80 MG tablet Take 80 mg by mouth at bedtime.        . sildenafil (VIAGRA) 100 MG tablet  Take 100 mg by mouth as directed.        . sildenafil (VIAGRA) 50 MG tablet Take 50 mg by mouth as directed.        . tacrolimus (PROTOPIC) 0.1 % ointment Apply topically daily as needed.        . tiotropium (SPIRIVA) 18 MCG inhalation capsule Place 18 mcg into inhaler and inhale daily.        . vitamin B-12 (CYANOCOBALAMIN) 1000 MCG tablet Take 1,000 mcg by mouth daily.           Past Medical History  Diagnosis Date  . Chronic ischemic heart disease, unspecified   . Other testicular hypofunction   . Chronic airway obstruction, not elsewhere classified   . Renal insufficiency, mild     hx of  . PUD (peptic ulcer disease)   . Obesity   . PUD (peptic ulcer disease)   . Nontoxic multinodular goiter   . Diabetes mellitus   .  Coronary artery disease   . Hyperlipidemia, mixed   . Essential hypertension, benign      Past Surgical History  Procedure Date  . Cholecystectomy   . Shoulder surgery     left shoulder  . Coronary artery bypass graft 2008    x 4     History reviewed. No pertinent family history.   History   Social History  . Marital Status: Married    Spouse Name: N/A    Number of Children: N/A  . Years of Education: N/A   Occupational History  . Retired    Social History Main Topics  . Smoking status: Former Smoker -- 1.5 packs/day for 30 years    Quit date: 12/24/2005  . Smokeless tobacco: Not on file  . Alcohol Use: Not on file  . Drug Use: Not on file  . Sexually Active: Not on file   Other Topics Concern  . Not on file   Social History Narrative  . No narrative on file       PHYSICAL EXAM   BP 153/77  Pulse 52  Ht 5' 11"  (1.803 m)  Wt 268 lb (121.564 kg)  BMI 37.38 kg/m2  SpO2 95%  Constitutional: He is oriented to person, place, and time. He appears well-developed and well-nourished. No distress.  HENT: No nasal discharge.  Head: Normocephalic and atraumatic.  Eyes: Pupils are equal, round, and reactive to light. Right eye  exhibits no discharge. Left eye exhibits no discharge.  Neck: Normal range of motion. Neck supple. No JVD present. No thyromegaly present.  Cardiovascular: Normal rate, regular rhythm, normal heart sounds and intact distal pulses. Exam reveals no gallop and no friction rub.  No murmur heard.  Pulmonary/Chest: Effort normal and breath sounds normal. No stridor. No respiratory distress. He has no wheezes. He has no rales. He exhibits no tenderness.  Abdominal: Soft. Bowel sounds are normal. He exhibits no distension. There is no tenderness. There is no rebound and no guarding.  Musculoskeletal: Normal range of motion. He exhibits no edema and no tenderness.  Neurological: He is alert and oriented to person, place, and time. Coordination normal.  Skin: Skin is warm and dry. No rash noted. He is not diaphoretic. No erythema. No pallor.  Psychiatric: He has a normal mood and affect. His behavior is normal. Judgment and thought content normal.      EKG: Sinus bradycardia with a heart rate of 51 beats per minute. No significant ST or T wave changes.   ASSESSMENT AND PLAN

## 2011-07-20 ENCOUNTER — Ambulatory Visit: Payer: Medicare Other | Admitting: Endocrinology

## 2011-09-13 LAB — CBC
HCT: 42.7
Hemoglobin: 14.6
MCHC: 34.2
MCV: 89
Platelets: 266
RBC: 4.79
RDW: 13.1
WBC: 12.3 — ABNORMAL HIGH

## 2011-09-13 LAB — BASIC METABOLIC PANEL
BUN: 23
CO2: 26
Calcium: 9
Chloride: 108
Creatinine, Ser: 1.22
GFR calc Af Amer: >60
GFR calc non Af Amer: >60
Glucose, Bld: 108 — ABNORMAL HIGH
Potassium: 4.6
Sodium: 140

## 2011-09-13 LAB — URINALYSIS, ROUTINE W REFLEX MICROSCOPIC
Glucose, UA: NEGATIVE
Hgb urine dipstick: NEGATIVE
Ketones, ur: 15 — AB
Nitrite: NEGATIVE
Protein, ur: NEGATIVE
Specific Gravity, Urine: 1.035 — ABNORMAL HIGH
Urobilinogen, UA: 1
pH: 5.5

## 2011-09-13 LAB — APTT: aPTT: 30

## 2011-09-13 LAB — PROTIME-INR
INR: 1.1
Prothrombin Time: 13.9

## 2011-09-14 LAB — BASIC METABOLIC PANEL
BUN: 22
CO2: 25
Calcium: 8.4
Chloride: 100
Creatinine, Ser: 1.09
GFR calc Af Amer: >60
GFR calc non Af Amer: >60
Glucose, Bld: 98
Potassium: 3.7
Sodium: 131 — ABNORMAL LOW

## 2011-09-14 LAB — CBC
HCT: 38.2 — ABNORMAL LOW
Hemoglobin: 13.1
MCHC: 34.4
MCV: 88.2
Platelets: 221
RBC: 4.33
RDW: 13.5
WBC: 14.4 — ABNORMAL HIGH

## 2012-01-15 DIAGNOSIS — Z951 Presence of aortocoronary bypass graft: Secondary | ICD-10-CM | POA: Diagnosis not present

## 2012-01-15 DIAGNOSIS — E785 Hyperlipidemia, unspecified: Secondary | ICD-10-CM | POA: Diagnosis not present

## 2012-01-15 DIAGNOSIS — E119 Type 2 diabetes mellitus without complications: Secondary | ICD-10-CM | POA: Diagnosis not present

## 2012-01-15 DIAGNOSIS — Z7982 Long term (current) use of aspirin: Secondary | ICD-10-CM | POA: Diagnosis not present

## 2012-01-15 DIAGNOSIS — Z79899 Other long term (current) drug therapy: Secondary | ICD-10-CM | POA: Diagnosis not present

## 2012-01-15 DIAGNOSIS — R296 Repeated falls: Secondary | ICD-10-CM | POA: Diagnosis not present

## 2012-01-15 DIAGNOSIS — I2510 Atherosclerotic heart disease of native coronary artery without angina pectoris: Secondary | ICD-10-CM | POA: Diagnosis not present

## 2012-01-15 DIAGNOSIS — S42009A Fracture of unspecified part of unspecified clavicle, initial encounter for closed fracture: Secondary | ICD-10-CM | POA: Diagnosis not present

## 2012-01-15 DIAGNOSIS — I10 Essential (primary) hypertension: Secondary | ICD-10-CM | POA: Diagnosis not present

## 2012-01-21 DIAGNOSIS — M79609 Pain in unspecified limb: Secondary | ICD-10-CM | POA: Diagnosis not present

## 2012-01-21 DIAGNOSIS — M109 Gout, unspecified: Secondary | ICD-10-CM | POA: Diagnosis not present

## 2012-02-12 DIAGNOSIS — Z6836 Body mass index (BMI) 36.0-36.9, adult: Secondary | ICD-10-CM | POA: Diagnosis not present

## 2012-02-12 DIAGNOSIS — S42023A Displaced fracture of shaft of unspecified clavicle, initial encounter for closed fracture: Secondary | ICD-10-CM | POA: Diagnosis not present

## 2012-03-12 DIAGNOSIS — S42023A Displaced fracture of shaft of unspecified clavicle, initial encounter for closed fracture: Secondary | ICD-10-CM | POA: Diagnosis not present

## 2012-03-14 DIAGNOSIS — M6580 Other synovitis and tenosynovitis, unspecified site: Secondary | ICD-10-CM | POA: Diagnosis not present

## 2012-04-02 DIAGNOSIS — J069 Acute upper respiratory infection, unspecified: Secondary | ICD-10-CM | POA: Diagnosis not present

## 2012-04-02 DIAGNOSIS — I259 Chronic ischemic heart disease, unspecified: Secondary | ICD-10-CM | POA: Diagnosis not present

## 2012-04-22 DIAGNOSIS — E7800 Pure hypercholesterolemia, unspecified: Secondary | ICD-10-CM | POA: Diagnosis not present

## 2012-04-22 DIAGNOSIS — J449 Chronic obstructive pulmonary disease, unspecified: Secondary | ICD-10-CM | POA: Diagnosis not present

## 2012-04-22 DIAGNOSIS — Z23 Encounter for immunization: Secondary | ICD-10-CM | POA: Diagnosis not present

## 2012-04-22 DIAGNOSIS — I10 Essential (primary) hypertension: Secondary | ICD-10-CM | POA: Diagnosis not present

## 2012-04-23 DIAGNOSIS — S42023A Displaced fracture of shaft of unspecified clavicle, initial encounter for closed fracture: Secondary | ICD-10-CM | POA: Diagnosis not present

## 2012-04-30 DIAGNOSIS — J42 Unspecified chronic bronchitis: Secondary | ICD-10-CM | POA: Diagnosis not present

## 2012-04-30 DIAGNOSIS — I259 Chronic ischemic heart disease, unspecified: Secondary | ICD-10-CM | POA: Diagnosis not present

## 2012-05-01 DIAGNOSIS — E119 Type 2 diabetes mellitus without complications: Secondary | ICD-10-CM | POA: Diagnosis not present

## 2012-05-01 DIAGNOSIS — K219 Gastro-esophageal reflux disease without esophagitis: Secondary | ICD-10-CM | POA: Diagnosis not present

## 2012-05-01 DIAGNOSIS — E785 Hyperlipidemia, unspecified: Secondary | ICD-10-CM | POA: Diagnosis not present

## 2012-05-01 DIAGNOSIS — I10 Essential (primary) hypertension: Secondary | ICD-10-CM | POA: Diagnosis not present

## 2012-05-27 DIAGNOSIS — M79609 Pain in unspecified limb: Secondary | ICD-10-CM | POA: Diagnosis not present

## 2012-05-29 DIAGNOSIS — R0602 Shortness of breath: Secondary | ICD-10-CM | POA: Diagnosis not present

## 2012-05-29 DIAGNOSIS — I2510 Atherosclerotic heart disease of native coronary artery without angina pectoris: Secondary | ICD-10-CM | POA: Diagnosis not present

## 2012-06-09 DIAGNOSIS — E119 Type 2 diabetes mellitus without complications: Secondary | ICD-10-CM | POA: Diagnosis not present

## 2012-06-09 DIAGNOSIS — I2789 Other specified pulmonary heart diseases: Secondary | ICD-10-CM | POA: Diagnosis not present

## 2012-06-09 DIAGNOSIS — I10 Essential (primary) hypertension: Secondary | ICD-10-CM | POA: Diagnosis not present

## 2012-06-09 DIAGNOSIS — I739 Peripheral vascular disease, unspecified: Secondary | ICD-10-CM | POA: Diagnosis not present

## 2012-07-02 DIAGNOSIS — R0609 Other forms of dyspnea: Secondary | ICD-10-CM | POA: Diagnosis not present

## 2012-07-03 DIAGNOSIS — G4733 Obstructive sleep apnea (adult) (pediatric): Secondary | ICD-10-CM | POA: Diagnosis not present

## 2012-07-16 DIAGNOSIS — M1990 Unspecified osteoarthritis, unspecified site: Secondary | ICD-10-CM | POA: Diagnosis not present

## 2012-07-16 DIAGNOSIS — J449 Chronic obstructive pulmonary disease, unspecified: Secondary | ICD-10-CM | POA: Diagnosis not present

## 2012-07-16 DIAGNOSIS — H9209 Otalgia, unspecified ear: Secondary | ICD-10-CM | POA: Diagnosis not present

## 2012-07-16 DIAGNOSIS — M79609 Pain in unspecified limb: Secondary | ICD-10-CM | POA: Diagnosis not present

## 2012-08-18 DIAGNOSIS — J449 Chronic obstructive pulmonary disease, unspecified: Secondary | ICD-10-CM | POA: Diagnosis not present

## 2012-08-18 DIAGNOSIS — E782 Mixed hyperlipidemia: Secondary | ICD-10-CM | POA: Diagnosis not present

## 2012-09-29 DIAGNOSIS — I10 Essential (primary) hypertension: Secondary | ICD-10-CM | POA: Diagnosis not present

## 2012-09-29 DIAGNOSIS — I739 Peripheral vascular disease, unspecified: Secondary | ICD-10-CM | POA: Diagnosis not present

## 2012-09-29 DIAGNOSIS — I2789 Other specified pulmonary heart diseases: Secondary | ICD-10-CM | POA: Diagnosis not present

## 2012-09-29 DIAGNOSIS — I2510 Atherosclerotic heart disease of native coronary artery without angina pectoris: Secondary | ICD-10-CM | POA: Diagnosis not present

## 2012-10-01 DIAGNOSIS — D539 Nutritional anemia, unspecified: Secondary | ICD-10-CM | POA: Diagnosis not present

## 2012-10-01 DIAGNOSIS — R1013 Epigastric pain: Secondary | ICD-10-CM | POA: Diagnosis not present

## 2012-10-01 DIAGNOSIS — N529 Male erectile dysfunction, unspecified: Secondary | ICD-10-CM | POA: Diagnosis not present

## 2012-10-01 DIAGNOSIS — M1990 Unspecified osteoarthritis, unspecified site: Secondary | ICD-10-CM | POA: Diagnosis not present

## 2012-10-01 DIAGNOSIS — M109 Gout, unspecified: Secondary | ICD-10-CM | POA: Diagnosis not present

## 2012-10-03 DIAGNOSIS — K219 Gastro-esophageal reflux disease without esophagitis: Secondary | ICD-10-CM | POA: Diagnosis not present

## 2012-10-03 DIAGNOSIS — D509 Iron deficiency anemia, unspecified: Secondary | ICD-10-CM | POA: Diagnosis not present

## 2012-10-05 DIAGNOSIS — G4733 Obstructive sleep apnea (adult) (pediatric): Secondary | ICD-10-CM | POA: Diagnosis not present

## 2012-10-06 DIAGNOSIS — Z951 Presence of aortocoronary bypass graft: Secondary | ICD-10-CM | POA: Diagnosis not present

## 2012-10-06 DIAGNOSIS — D500 Iron deficiency anemia secondary to blood loss (chronic): Secondary | ICD-10-CM | POA: Diagnosis not present

## 2012-10-06 DIAGNOSIS — Z7902 Long term (current) use of antithrombotics/antiplatelets: Secondary | ICD-10-CM | POA: Diagnosis not present

## 2012-10-06 DIAGNOSIS — Z7982 Long term (current) use of aspirin: Secondary | ICD-10-CM | POA: Diagnosis not present

## 2012-10-06 DIAGNOSIS — I2510 Atherosclerotic heart disease of native coronary artery without angina pectoris: Secondary | ICD-10-CM | POA: Diagnosis not present

## 2012-10-06 DIAGNOSIS — K449 Diaphragmatic hernia without obstruction or gangrene: Secondary | ICD-10-CM | POA: Diagnosis not present

## 2012-10-06 DIAGNOSIS — D62 Acute posthemorrhagic anemia: Secondary | ICD-10-CM | POA: Diagnosis not present

## 2012-10-06 DIAGNOSIS — R1013 Epigastric pain: Secondary | ICD-10-CM | POA: Diagnosis not present

## 2012-10-06 DIAGNOSIS — K921 Melena: Secondary | ICD-10-CM | POA: Diagnosis not present

## 2012-10-06 DIAGNOSIS — I10 Essential (primary) hypertension: Secondary | ICD-10-CM | POA: Diagnosis not present

## 2012-10-06 DIAGNOSIS — I252 Old myocardial infarction: Secondary | ICD-10-CM | POA: Diagnosis not present

## 2012-10-06 DIAGNOSIS — E119 Type 2 diabetes mellitus without complications: Secondary | ICD-10-CM | POA: Diagnosis not present

## 2012-10-06 DIAGNOSIS — K2970 Gastritis, unspecified, without bleeding: Secondary | ICD-10-CM | POA: Diagnosis not present

## 2012-10-06 DIAGNOSIS — Z79899 Other long term (current) drug therapy: Secondary | ICD-10-CM | POA: Diagnosis not present

## 2012-10-06 DIAGNOSIS — J449 Chronic obstructive pulmonary disease, unspecified: Secondary | ICD-10-CM | POA: Diagnosis not present

## 2012-10-06 DIAGNOSIS — K219 Gastro-esophageal reflux disease without esophagitis: Secondary | ICD-10-CM | POA: Diagnosis not present

## 2012-10-06 DIAGNOSIS — G4730 Sleep apnea, unspecified: Secondary | ICD-10-CM | POA: Diagnosis not present

## 2012-10-24 DIAGNOSIS — G4733 Obstructive sleep apnea (adult) (pediatric): Secondary | ICD-10-CM | POA: Diagnosis not present

## 2012-10-24 DIAGNOSIS — I10 Essential (primary) hypertension: Secondary | ICD-10-CM | POA: Diagnosis not present

## 2012-10-24 DIAGNOSIS — I2789 Other specified pulmonary heart diseases: Secondary | ICD-10-CM | POA: Diagnosis not present

## 2012-10-24 DIAGNOSIS — J342 Deviated nasal septum: Secondary | ICD-10-CM | POA: Diagnosis not present

## 2012-10-28 DIAGNOSIS — M19049 Primary osteoarthritis, unspecified hand: Secondary | ICD-10-CM | POA: Diagnosis not present

## 2012-11-25 DIAGNOSIS — D509 Iron deficiency anemia, unspecified: Secondary | ICD-10-CM | POA: Diagnosis not present

## 2012-12-01 DIAGNOSIS — R0989 Other specified symptoms and signs involving the circulatory and respiratory systems: Secondary | ICD-10-CM | POA: Diagnosis not present

## 2012-12-01 DIAGNOSIS — K439 Ventral hernia without obstruction or gangrene: Secondary | ICD-10-CM | POA: Diagnosis not present

## 2012-12-01 DIAGNOSIS — N289 Disorder of kidney and ureter, unspecified: Secondary | ICD-10-CM | POA: Diagnosis not present

## 2012-12-01 DIAGNOSIS — K5730 Diverticulosis of large intestine without perforation or abscess without bleeding: Secondary | ICD-10-CM | POA: Diagnosis not present

## 2012-12-01 DIAGNOSIS — R0609 Other forms of dyspnea: Secondary | ICD-10-CM | POA: Diagnosis not present

## 2012-12-01 DIAGNOSIS — R1013 Epigastric pain: Secondary | ICD-10-CM | POA: Diagnosis not present

## 2012-12-01 DIAGNOSIS — D509 Iron deficiency anemia, unspecified: Secondary | ICD-10-CM | POA: Diagnosis not present

## 2012-12-01 DIAGNOSIS — I10 Essential (primary) hypertension: Secondary | ICD-10-CM | POA: Diagnosis not present

## 2012-12-11 ENCOUNTER — Telehealth: Payer: Self-pay | Admitting: Oncology

## 2012-12-11 DIAGNOSIS — D649 Anemia, unspecified: Secondary | ICD-10-CM | POA: Diagnosis not present

## 2012-12-11 NOTE — Telephone Encounter (Signed)
S/W PT IN REF TO NP APPT. ON 12/19/12@1 :00 REFERRING DR Lyndel Safe DX-SEVERE ANEMIA MAILED NP PACKET

## 2012-12-11 NOTE — Telephone Encounter (Signed)
C/D 12/11/12 for appt. 12/19/12

## 2012-12-16 ENCOUNTER — Encounter: Payer: Self-pay | Admitting: Oncology

## 2012-12-16 DIAGNOSIS — D649 Anemia, unspecified: Secondary | ICD-10-CM | POA: Insufficient documentation

## 2012-12-18 DIAGNOSIS — J440 Chronic obstructive pulmonary disease with acute lower respiratory infection: Secondary | ICD-10-CM | POA: Diagnosis not present

## 2012-12-19 ENCOUNTER — Ambulatory Visit (HOSPITAL_BASED_OUTPATIENT_CLINIC_OR_DEPARTMENT_OTHER): Payer: Medicare Other | Admitting: Oncology

## 2012-12-19 ENCOUNTER — Telehealth: Payer: Self-pay | Admitting: Oncology

## 2012-12-19 ENCOUNTER — Ambulatory Visit (HOSPITAL_BASED_OUTPATIENT_CLINIC_OR_DEPARTMENT_OTHER): Payer: Medicare Other

## 2012-12-19 ENCOUNTER — Encounter: Payer: Self-pay | Admitting: Oncology

## 2012-12-19 ENCOUNTER — Other Ambulatory Visit (HOSPITAL_BASED_OUTPATIENT_CLINIC_OR_DEPARTMENT_OTHER): Payer: Medicare Other

## 2012-12-19 VITALS — BP 139/72 | HR 66 | Temp 96.8°F | Resp 20 | Ht 71.0 in | Wt 268.8 lb

## 2012-12-19 DIAGNOSIS — D509 Iron deficiency anemia, unspecified: Secondary | ICD-10-CM

## 2012-12-19 DIAGNOSIS — D649 Anemia, unspecified: Secondary | ICD-10-CM | POA: Diagnosis not present

## 2012-12-19 LAB — CBC & DIFF AND RETIC
BASO%: 0.3 % (ref 0.0–2.0)
HCT: 32.2 % — ABNORMAL LOW (ref 38.4–49.9)
Immature Retic Fract: 26.3 % — ABNORMAL HIGH (ref 3.00–10.60)
MCHC: 30.7 g/dL — ABNORMAL LOW (ref 32.0–36.0)
MONO#: 0.4 10*3/uL (ref 0.1–0.9)
NEUT%: 89.9 % — ABNORMAL HIGH (ref 39.0–75.0)
RBC: 4.04 10*6/uL — ABNORMAL LOW (ref 4.20–5.82)
RDW: 17.3 % — ABNORMAL HIGH (ref 11.0–14.6)
Retic %: 2.46 % — ABNORMAL HIGH (ref 0.80–1.80)
Retic Ct Abs: 99.38 10*3/uL — ABNORMAL HIGH (ref 34.80–93.90)
WBC: 12.1 10*3/uL — ABNORMAL HIGH (ref 4.0–10.3)
lymph#: 0.8 10*3/uL — ABNORMAL LOW (ref 0.9–3.3)

## 2012-12-19 LAB — CHCC SMEAR

## 2012-12-19 MED ORDER — SODIUM CHLORIDE 0.9 % IV SOLN
1020.0000 mg | Freq: Once | INTRAVENOUS | Status: AC
  Administered 2012-12-19: 1020 mg via INTRAVENOUS
  Filled 2012-12-19: qty 34

## 2012-12-19 NOTE — Telephone Encounter (Signed)
gv and printed appt schedule for pt for Jan thru June 2014

## 2012-12-19 NOTE — Progress Notes (Signed)
[facility]  [phone] [phone]     INITIAL HEMATOLOGY CONSULTATION    Referral MD:  Dr. Jackquline Denmark, M.D.  Reason for Referral:  Anemia.   HPI:  [name] is a 66 year-old man with many medical issues.  He did not have anemia in the past.  He was recently found to have anemia by his GI physician.  His EGD recently did not show active bleeding.  He had a reportedly negative colonoscopy by the [facility] in 2012.  He had a reportedly negative CT abdomen (I'm awaiting for report).  Dr. Lyndel Safe has recommended to proceed with repeat colonoscopy and possibly even capsule endoscopsy.  He was kindly referred to the [facility] for evaluation.   He presented to the Clinic for the first time today with his wife.  He reported that for the last few months, he has been feeling tired. He has dizziness, DOE.  He has lost about 20-lb non intentionally.  He has decreased appetite.  He possibly also has ice picca.  He had melena for about 2 weeks in November 2013 which spontaneously resolved.  He has not seen any gross hematuria.   Patient denies fever, headache, visual changes, confusion, drenching night sweats, palpable lymph node swelling, mucositis, odynophagia, dysphagia, nausea vomiting, jaundice, chest pain, palpitation, shortness of breath, dyspnea on exertion, productive cough, gum bleeding, epistaxis, hematemesis, hemoptysis, abdominal pain, abdominal swelling, early satiety, hematochezia, skin rash, spontaneous bleeding, joint swelling, joint pain, heat or cold intolerance, bowel bladder incontinence, back pain, focal motor weakness, paresthesia, depression, suicidal or homicidal ideation, feeling hopelessness.     Past Medical History  Diagnosis Date  . Chronic ischemic heart disease, unspecified   . Other testicular hypofunction   . Chronic airway obstruction, not elsewhere classified   . Renal insufficiency, mild     hx of  . PUD (peptic ulcer  disease)   . Obesity   . Nontoxic multinodular goiter   . Diabetes mellitus     type II, neuropathy,   . Coronary artery disease   . Hyperlipidemia, mixed   . Essential hypertension, benign   . Anemia   . Nodule of kidney     incidentally found on CT abdomen 11/2012.  Pending Urology evaluation.   :    Past Surgical History  Procedure Date  . Cholecystectomy   . Shoulder surgery     left shoulder  . Coronary artery bypass graft 2007    x 4  . Tonsilectomy, adenoidectomy, bilateral myringotomy and tubes   . Esophagogastroduodenoscopy     10/06/2012:  hiatal hernia, moderate gastritis.  2012: Colonoscopy at [facility]:  one polyp.  Due next 2017  :   CURRENT MEDS: Current Outpatient Prescriptions  Medication Sig Dispense Refill  . allopurinol (ZYLOPRIM) 100 MG tablet Take 100 mg by mouth 2 (two) times daily.      Marland Kitchen amLODipine (NORVASC) 5 MG tablet Take 5 mg by mouth daily.        Marland Kitchen aspirin 81 MG chewable tablet Chew 81 mg by mouth daily.        . clopidogrel (PLAVIX) 75 MG tablet Take 75 mg by mouth daily.      Marland Kitchen ipratropium-albuterol (DUONEB) 0.5-2.5 (3) MG/3ML SOLN Take 3 mLs by nebulization every 4 (four) hours as needed.      Marland Kitchen lisinopril (PRINIVIL,ZESTRIL) 5 MG tablet Take 5 mg by mouth daily.      . meloxicam (MOBIC) 15 MG tablet  Take 15 mg by mouth daily. As needed      . metFORMIN (GLUCOPHAGE) 500 MG tablet Take 850 mg by mouth 2 (two) times daily with a meal.       . nebivolol (BYSTOLIC) 5 MG tablet Take 5 mg by mouth daily.      Marland Kitchen omeprazole (PRILOSEC) 40 MG capsule 1 capsule by mouth two times daily before meals       . pravastatin (PRAVACHOL) 80 MG tablet Take 80 mg by mouth at bedtime.        . roflumilast (DALIRESP) 500 MCG TABS tablet Take 500 mcg by mouth daily.      . tacrolimus (PROTOPIC) 0.1 % ointment Apply topically daily as needed.        . tadalafil (CIALIS) 20 MG tablet Take 20 mg by mouth daily as needed.      . tiotropium (SPIRIVA) 18 MCG inhalation  capsule Place 18 mcg into inhaler and inhale daily.        . nitroGLYCERIN (NITROSTAT) 0.4 MG SL tablet Place 0.4 mg under the tongue every 5 (five) minutes as needed.            No Known Allergies:  Family History  Problem Relation Age of Onset  . Cancer Mother     colon  . Cancer Father     esophageal  :  History   Social History  . Marital Status: Married    Spouse Name: N/A    Number of Children: 44  . Years of Education: N/A   Occupational History  . Retired     Medical illustrator; retired Chiropodist   Social History Main Topics  . Smoking status: Former Smoker -- 1.5 packs/day for 30 years    Quit date: 12/24/2005  . Smokeless tobacco: Never Used  . Alcohol Use: No  . Drug Use: No  . Sexually Active: Not on file   Other Topics Concern  . Not on file   Social History Narrative  . No narrative on file  :  REVIEW OF SYSTEM:  The rest of the 14-point review of sytem was negative.   Exam: ECOG 1  General: mildly obese man, in no acute distress.  Eyes:  no scleral icterus.  ENT:  There were no oropharyngeal lesions.  Neck was without thyromegaly.  Lymphatics:  Negative cervical, supraclavicular or axillary adenopathy.  Respiratory: lungs were clear bilaterally without wheezing or crackles.  Cardiovascular:  Regular rate and rhythm, S1/S2, without murmur, rub or gallop.  There was no pedal edema.  GI:  abdomen was soft, flat, nontender, nondistended, without organomegaly.  He deferred rectal exam due to impending colonoscopy next week.  Muscoloskeletal:  no spinal tenderness of palpation of vertebral spine.  Skin exam was without echymosis, petichae.  Neuro exam was nonfocal.  Patient was able to get on and off exam table without assistance.  Gait was normal.  Patient was alerted and oriented.  Attention was good.   Language was appropriate.  Mood was normal without depression.  Speech was not pressured.  Thought content was not tangential.    LABS:  Lab Results   Component Value Date   WBC 12.1* 12/19/2012   HGB 9.9* 12/19/2012   HCT 32.2* 12/19/2012   PLT 210 12/19/2012   GLUCOSE 98 01/29/2008   NA 131* 01/29/2008   K 3.7 01/29/2008   CL 100 01/29/2008   CREATININE 1.09 01/29/2008   BUN 22 01/29/2008   CO2 25 01/29/2008  INR 1.1 01/21/2008    Blood smear review:   I personally reviewed the patient's peripheral blood smear today.  There was anisocytosis.  There was no peripheral blast.  There was no schistocytosis, spherocytosis, target cell, rouleaux formation, tear drop cell.  There was microcytosis and slight increased in central palor.  There was no giant platelets or platelet clumps.     ASSESSMENT AND PLAN:   1.  Gout:  He is on allopurinol. 2.  Hypertension: He is on amlodipine, Lisinopril, nebivolol.  3.  Hyperlipidemia:  He is on pravastatin. 4.  COPD:  On inhalers. 5.  CAD: on aspirin, Plavix, nebivolol, lisinopril; pravastatin. 6.  Microcytic anemia with iron deficiency: - Cause:  Unknown  (malabsorption vs small bowel AVM?)   Pending GI work up.   Reportedly negative EGD and CT scan.  Pending repeat colonoscopy and possibly even capsule endoscopy.  -  Treatment:  *  He could not tolerate oral iron due to severe dyspepsia.   *  I recommend IV iron Feraheme.  I discussed with him a slight chance of infusion reaction.  He expressed informed understanding and wished to receive IV iron today.   7.  Renal nodule:  Incidental finding on CT scan.  Pending Urology work up.   9.  Follow up:   *  Lab check monthly here at the [facility].  *  Return visit in about 6 months.         The length of time of the face-to-face encounter was 45 minutes. More than 50% of time was spent counseling and coordination of care.     Thank you for this referral.

## 2012-12-19 NOTE — Patient Instructions (Addendum)
  FERUMOXYTOL is an iron complex. Iron is used to make healthy red blood cells, which carry oxygen and nutrients throughout the body. This medicine is used to treat iron deficiency anemia in people with chronic kidney disease. This medicine may be used for other purposes; ask your health care provider or pharmacist if you have questions. What should I tell my health care provider before I take this medicine? They need to know if you have any of these conditions: -anemia not caused by low iron levels -high levels of iron in the blood -magnetic resonance imaging (MRI) test scheduled -an unusual or allergic reaction to iron, other medicines, foods, dyes, or preservatives -pregnant or trying to get pregnant -breast-feeding How should I use this medicine? This medicine is for infusion into a vein. It is given by a health care professional in a hospital or clinic setting. Talk to your pediatrician regarding the use of this medicine in children. Special care may be needed. Overdosage: If you think you've taken too much of this medicine contact a poison control center or emergency room at once. Overdosage: If you think you have taken too much of this medicine contact a poison control center or emergency room at once. NOTE: This medicine is only for you. Do not share this medicine with others. What if I miss a dose? It is important not to miss your dose. Call your doctor or health care professional if you are unable to keep an appointment. What may interact with this medicine? This medicine may interact with the following medications: -other iron products This list may not describe all possible interactions. Give your health care provider a list of all the medicines, herbs, non-prescription drugs, or dietary supplements you use. Also tell them if you smoke, drink alcohol, or use illegal drugs. Some items may interact with your medicine. What should I watch for while using this medicine? Visit your  doctor or healthcare professional regularly. Tell your doctor or healthcare professional if your symptoms do not start to get better or if they get worse. You may need blood work done while you are taking this medicine. You may need to follow a special diet. Talk to your doctor. Foods that contain iron include: whole grains/cereals, dried fruits, beans, or peas, leafy green vegetables, and organ meats (liver, kidney). What side effects may I notice from receiving this medicine? Side effects that you should report to your doctor or health care professional as soon as possible: -allergic reactions like skin rash, itching or hives, swelling of the face, lips, or tongue -breathing problems -changes in blood pressure -feeling faint or lightheaded, falls -fever or chills -flushing, sweating, or hot feelings -swelling of the ankles or feet Side effects that usually do not require medical attention (Report these to your doctor or health care professional if they continue or are bothersome.): -diarrhea -headache -nausea, vomiting -stomach pain This list may not describe all possible side effects. Call your doctor for medical advice about side effects. You may report side effects to FDA at 1-800-FDA-1088. Where should I keep my medicine? This drug is given in a hospital or clinic and will not be stored at home. NOTE: This sheet is a summary. It may not cover all possible information. If you have questions about this medicine, talk to your doctor, pharmacist, or health care provider.  2012, Elsevier/Gold Standard. (09/01/2008 9:48:25 PM) 

## 2012-12-19 NOTE — Progress Notes (Signed)
1615 Post 30 minute observation comopleted. VSS. Patient with no complaints and tolerated treatment well. Discharged ambulating with spouse. AVS provided.

## 2012-12-19 NOTE — Patient Instructions (Addendum)
1.  Iron deficiency anemia. 2.  Cause:  Unknown.  Pending GI work up.   3.  Treatment:  *  Continue oral iron as tolerated.  *  I recommend IV iron Feraheme to improve Hgb and your strength faster.   4.  Follow up:   *  Lab check monthly here at the [facility].  *  Return visit in about 6 months.  *  I agree with Dr. Urban Gibson recommendation that if no improvement with iron repletion, we may need to consider CT scan and bone marrow biopsy.

## 2012-12-22 DIAGNOSIS — K5730 Diverticulosis of large intestine without perforation or abscess without bleeding: Secondary | ICD-10-CM | POA: Diagnosis not present

## 2012-12-22 DIAGNOSIS — E119 Type 2 diabetes mellitus without complications: Secondary | ICD-10-CM | POA: Diagnosis not present

## 2012-12-22 DIAGNOSIS — I10 Essential (primary) hypertension: Secondary | ICD-10-CM | POA: Diagnosis not present

## 2012-12-22 DIAGNOSIS — Z7982 Long term (current) use of aspirin: Secondary | ICD-10-CM | POA: Diagnosis not present

## 2012-12-22 DIAGNOSIS — D509 Iron deficiency anemia, unspecified: Secondary | ICD-10-CM | POA: Diagnosis not present

## 2012-12-22 DIAGNOSIS — I252 Old myocardial infarction: Secondary | ICD-10-CM | POA: Diagnosis not present

## 2012-12-22 DIAGNOSIS — D126 Benign neoplasm of colon, unspecified: Secondary | ICD-10-CM | POA: Diagnosis not present

## 2012-12-22 DIAGNOSIS — Z1211 Encounter for screening for malignant neoplasm of colon: Secondary | ICD-10-CM | POA: Diagnosis not present

## 2012-12-22 DIAGNOSIS — K648 Other hemorrhoids: Secondary | ICD-10-CM | POA: Diagnosis not present

## 2012-12-22 DIAGNOSIS — F17200 Nicotine dependence, unspecified, uncomplicated: Secondary | ICD-10-CM | POA: Diagnosis not present

## 2012-12-22 DIAGNOSIS — Z87891 Personal history of nicotine dependence: Secondary | ICD-10-CM | POA: Diagnosis not present

## 2012-12-23 DIAGNOSIS — K31819 Angiodysplasia of stomach and duodenum without bleeding: Secondary | ICD-10-CM | POA: Diagnosis not present

## 2012-12-23 DIAGNOSIS — K921 Melena: Secondary | ICD-10-CM | POA: Diagnosis not present

## 2012-12-23 DIAGNOSIS — D509 Iron deficiency anemia, unspecified: Secondary | ICD-10-CM | POA: Diagnosis not present

## 2012-12-23 DIAGNOSIS — D62 Acute posthemorrhagic anemia: Secondary | ICD-10-CM | POA: Diagnosis not present

## 2012-12-29 DIAGNOSIS — R1901 Right upper quadrant abdominal swelling, mass and lump: Secondary | ICD-10-CM | POA: Diagnosis not present

## 2012-12-29 DIAGNOSIS — N289 Disorder of kidney and ureter, unspecified: Secondary | ICD-10-CM | POA: Diagnosis not present

## 2013-01-03 DIAGNOSIS — G4733 Obstructive sleep apnea (adult) (pediatric): Secondary | ICD-10-CM | POA: Diagnosis not present

## 2013-01-08 DIAGNOSIS — G4733 Obstructive sleep apnea (adult) (pediatric): Secondary | ICD-10-CM | POA: Diagnosis not present

## 2013-01-13 ENCOUNTER — Telehealth: Payer: Self-pay | Admitting: Oncology

## 2013-01-13 NOTE — Telephone Encounter (Signed)
pt wanted later lab due to the weather....Marland KitchenMarland KitchenDone

## 2013-01-14 ENCOUNTER — Institutional Professional Consult (permissible substitution): Payer: Medicare Other | Admitting: Internal Medicine

## 2013-01-16 ENCOUNTER — Other Ambulatory Visit (HOSPITAL_BASED_OUTPATIENT_CLINIC_OR_DEPARTMENT_OTHER): Payer: Medicare Other

## 2013-01-16 ENCOUNTER — Other Ambulatory Visit: Payer: Medicare Other | Admitting: Lab

## 2013-01-16 DIAGNOSIS — D649 Anemia, unspecified: Secondary | ICD-10-CM | POA: Diagnosis not present

## 2013-01-16 DIAGNOSIS — D509 Iron deficiency anemia, unspecified: Secondary | ICD-10-CM | POA: Diagnosis not present

## 2013-01-16 LAB — CBC WITH DIFFERENTIAL/PLATELET
BASO%: 0.5 % (ref 0.0–2.0)
EOS%: 1.4 % (ref 0.0–7.0)
MCHC: 32.8 g/dL (ref 32.0–36.0)
MONO#: 0.9 10*3/uL (ref 0.1–0.9)
RBC: 4.85 10*6/uL (ref 4.20–5.82)
RDW: 24.5 % — ABNORMAL HIGH (ref 11.0–14.6)
WBC: 10.1 10*3/uL (ref 4.0–10.3)
lymph#: 1.5 10*3/uL (ref 0.9–3.3)

## 2013-01-22 ENCOUNTER — Encounter: Payer: Self-pay | Admitting: Internal Medicine

## 2013-01-22 ENCOUNTER — Ambulatory Visit (INDEPENDENT_AMBULATORY_CARE_PROVIDER_SITE_OTHER): Payer: Medicare Other | Admitting: Internal Medicine

## 2013-01-22 VITALS — BP 136/80 | HR 75 | Temp 98.8°F | Ht 72.0 in | Wt 264.8 lb

## 2013-01-22 DIAGNOSIS — J449 Chronic obstructive pulmonary disease, unspecified: Secondary | ICD-10-CM | POA: Insufficient documentation

## 2013-01-22 DIAGNOSIS — I10 Essential (primary) hypertension: Secondary | ICD-10-CM | POA: Diagnosis not present

## 2013-01-22 HISTORY — DX: Chronic obstructive pulmonary disease, unspecified: J44.9

## 2013-01-22 NOTE — Assessment & Plan Note (Signed)
Note on Acei with no obvious problem I can identify at present to preclude it's use  However, worth noting that ACE inhibitors are problematic in  pts with airway complaints because  even experienced pulmonologists can't always distinguish ace effects from copd/asthma.  By themselves they don't actually cause a problem, much like oxygen can't by itself start a fire, but they certainly serve as a powerful catalyst or enhancer for any "fire"  or inflammatory process in the upper airway, be it caused by an ET  tube or more commonly reflux (especially in the obese or pts with known GERD or who are on biphoshonates).    In the era of ARB near equivalency  Would have a low threshold to change to ARB's if his respiratory symptoms become difficult to manage as I really don't think at this point that his copd is more than mild base on the work of [employer] (see copd a/p) though pft's pending at this point.

## 2013-01-22 NOTE — Progress Notes (Signed)
  Subjective:    Patient ID: Lawrence [name], male    DOB: January 26, 1946  MRN: [id number]  HPI  35 yowm quit smoking 2006 @  cabg 100% better p this in terms of breathing s cough or need for resp meds  at  [facility] about 260 referred 01/22/2013 by Dr Lin Landsman for copd eval  01/22/2013 1st pulmonary cc fatigue > sob much better since received treatment for anemia and really can't name any activity he's prevented from doing due to sob at this point on duoneb qid and spiriva.( which he admits to stopping 10 days prior to OV  s adverse effect on breathing)  No obvious daytime variabilty or assoc chronic cough or cp or chest tightness, subjective wheeze overt sinus or hb symptoms. No unusual exp hx or h/o childhood pna/ asthma or premature birth to his knowledge.   Sleeping ok without nocturnal  or early am exacerbation  of respiratory  c/o's or need for noct saba. Also denies any obvious fluctuation of symptoms with weather or environmental changes or other aggravating or alleviating factors except as outlined above    Review of Systems  Constitutional: Positive for appetite change and unexpected weight change. Negative for fever.  HENT: Positive for ear pain and sore throat. Negative for nosebleeds, congestion, rhinorrhea, sneezing, trouble swallowing, dental problem, postnasal drip and sinus pressure.   Eyes: Negative for redness and itching.  Respiratory: Positive for shortness of breath. Negative for cough, chest tightness and wheezing.   Cardiovascular: Negative for palpitations and leg swelling.  Gastrointestinal: Negative for nausea and vomiting.  Genitourinary: Negative for dysuria.  Musculoskeletal: Negative for joint swelling.  Skin: Negative for rash.  Neurological: Negative for headaches.  Hematological: Does not bruise/bleed easily.  Psychiatric/Behavioral: Negative for dysphoric mood. The patient is not nervous/anxious.        Objective:   Physical Exam  amb obese wm nad Wt Readings from  Last 3 Encounters:  01/22/13 264 lb 12.8 oz (120.112 kg)  12/19/12 268 lb 12.8 oz (121.927 kg)  07/16/11 268 lb (121.564 kg)   HEENT mild turbinate edema.  Oropharynx no thrush or excess pnd or cobblestoning.  No JVD or cervical adenopathy. Mild accessory muscle hypertrophy. Trachea midline, nl thryroid. Chest was hyperinflated by percussion with diminished breath sounds and moderate increased exp time without wheeze. Hoover sign positive at mid inspiration. Regular rate and rhythm without murmur gallop or rub or increase P2 or edema.  Abd: no hsm, nl excursion. Ext warm without cyanosis or clubbing.         Assessment & Plan:

## 2013-01-22 NOTE — Patient Instructions (Addendum)
Ok to leave off spiriva  Ok to use duoneb up to 4 x per day if trouble breathing, otherwise don't use it at all  [facility] to get back to regular activity  Please schedule a follow up office visit in 4 weeks, sooner if needed with pft's

## 2013-01-22 NOTE — Assessment & Plan Note (Signed)
  When respiratory symptoms begin or become refractory well after a patient reports complete smoking cessation,  Especially when this wasn't the case while they were smoking or after quitting, which is what this pt reports, a red flag is raised based on the work of Dr Kris Mouton which states:  if you quit smoking when your best day FEV1 is still well preserved it is highly unlikely you will progress to severe disease.  That is to say, once the smoking stops,  the symptoms should not suddenly erupt or markedly worsen.  If so, the differential diagnosis should include  obesity/deconditioning,  LPR/Reflux/Aspiration syndromes,  occult CHF, or  especially side effect of medications commonly used in this population - esp ACE inhibitors and non-specific beta blockers.  In his case it appears the anemia and obesity deconditioning also played a role but in any case he is so much better now he can't actually name a desired activity he's prevented from doing due to sob and so until he returns for pft's rec just use the duoneb qid prn rather than as a maint rx (the reverse of a therapeutic trial) and leave off the spiriva since he isn't using it consistently anyway.

## 2013-01-27 ENCOUNTER — Other Ambulatory Visit: Payer: Self-pay | Admitting: Oncology

## 2013-02-13 ENCOUNTER — Other Ambulatory Visit (HOSPITAL_BASED_OUTPATIENT_CLINIC_OR_DEPARTMENT_OTHER): Payer: Medicare Other | Admitting: Lab

## 2013-02-13 ENCOUNTER — Telehealth: Payer: Self-pay | Admitting: *Deleted

## 2013-02-13 DIAGNOSIS — D649 Anemia, unspecified: Secondary | ICD-10-CM

## 2013-02-13 DIAGNOSIS — D509 Iron deficiency anemia, unspecified: Secondary | ICD-10-CM | POA: Diagnosis not present

## 2013-02-13 LAB — CBC WITH DIFFERENTIAL/PLATELET
BASO%: 0.9 % (ref 0.0–2.0)
Basophils Absolute: 0.1 10*3/uL (ref 0.0–0.1)
HCT: 38.3 % — ABNORMAL LOW (ref 38.4–49.9)
HGB: 12.8 g/dL — ABNORMAL LOW (ref 13.0–17.1)
MONO#: 1 10*3/uL — ABNORMAL HIGH (ref 0.1–0.9)
NEUT%: 75.9 % — ABNORMAL HIGH (ref 39.0–75.0)
RDW: 19.9 % — ABNORMAL HIGH (ref 11.0–14.6)
WBC: 10.4 10*3/uL — ABNORMAL HIGH (ref 4.0–10.3)
lymph#: 1.3 10*3/uL (ref 0.9–3.3)

## 2013-02-13 NOTE — Telephone Encounter (Signed)
Message copied by Wende Mott on Fri Feb 13, 2013  3:02 PM ------      Message from: Lawrence [name]      Created: Fri Feb 13, 2013 12:24 PM       Please call pt.  His Hgb is stable.  Continue observation.  Thanks. ------

## 2013-03-10 ENCOUNTER — Ambulatory Visit (INDEPENDENT_AMBULATORY_CARE_PROVIDER_SITE_OTHER): Payer: Medicare Other | Admitting: Internal Medicine

## 2013-03-10 ENCOUNTER — Encounter: Payer: Self-pay | Admitting: Internal Medicine

## 2013-03-10 VITALS — BP 136/80 | HR 72 | Temp 97.9°F | Ht 70.0 in | Wt 264.0 lb

## 2013-03-10 DIAGNOSIS — J449 Chronic obstructive pulmonary disease, unspecified: Secondary | ICD-10-CM

## 2013-03-10 LAB — PULMONARY FUNCTION TEST

## 2013-03-10 MED ORDER — IPRATROPIUM-ALBUTEROL 20-100 MCG/ACT IN AERS: 1.0000 | INHALATION_SPRAY | Freq: Four times a day (QID) | RESPIRATORY_TRACT | Status: DC

## 2013-03-10 NOTE — Patient Instructions (Addendum)
You do not have significant copd and very unlikely you ever will unless you start smoking again.  The major finding on your lung function tests is the effect of your abdomen on diaphragm which will only improve with improve with weight loss.  Only use your albuterol/ipatropium as a rescue medication (plan A is combivent plan B is your duoneb,  Plan C = Call the doctor-me )  to be used if you can't catch your breath by resting or doing a relaxed purse lip breathing pattern. The less you use it, the better it will work when you need it. You could use it up to every 4 hours while waiting appt).   If you develop worse cough we will need to consider to taking you off lisinopril at some point.

## 2013-03-10 NOTE — Assessment & Plan Note (Signed)
-   PFTs 03/10/2013 FEV1  2.61 (86%) ratio 76 and no better p B2 with DLCO 66 corrects to 86%  He does not have significant copd and never will.  I reviewed the Flethcher curve with patient that basically indicates  if you quit smoking when your best day FEV1 is still well preserved it is highly unlikely you will progress to severe disease and informed the patient there was no medication on the market that has proven to change the curve or the likelihood of progression.  Therefore stopping smoking and maintaining abstinence is the most important aspect of care, not choice of inhalers or for that matter, doctors.    Ok to change to prn combivent - See instructions for specific recommendations which were reviewed directly with the patient who was given a copy with highlighter outlining the key components.   NB  When respiratory symptoms begin or become refractory well after a patient reports complete smoking cessation,  Especially when this wasn't the case while they were smoking, a red flag is raised based on the work of Dr Kris Mouton which states:  if you quit smoking when your best day FEV1 is still well preserved it is highly unlikely you will progress to severe disease.  That is to say, once the smoking stops,  the symptoms should not suddenly erupt or markedly worsen.  If so, the differential diagnosis should include  obesity/deconditioning,  LPR/Reflux/Aspiration syndromes,  occult CHF, or  especially side effect of medications commonly used in this population, esp acei, which he is on but tolerating well so far.  Obesity/deconditioning also a conern long term.

## 2013-03-10 NOTE — Assessment & Plan Note (Signed)
Target wt for BMI < 30  = 209 - pft's 03/10/2013 with ERV 39%  Reviewed impact of obesity on ERV,  A classic finding on his pft's.

## 2013-03-10 NOTE — Progress Notes (Signed)
PFT done today. 

## 2013-03-10 NOTE — Progress Notes (Signed)
  Subjective:    Patient ID: Lawrence [name], male    DOB: [birth year]  MRN: [id number]   Brief patient profile:  76 yowm quit smoking 2006 @  cabg 100% better p this in terms of breathing s cough or need for resp meds  at  [facility] about 260 referred 01/22/2013 by Dr Lin Landsman for copd eval.  01/22/2013 1st pulmonary cc fatigue > sob much better since received treatment for anemia and really can't name any activity he's prevented from doing due to sob at this point on duoneb qid and spiriva.( which he admits to stopping 10 days prior to OV  s adverse effect on breathing). rec Ok to leave off spiriva Ok to use duoneb up to 4 x per day if trouble breathing, otherwise don't use it at all [facility] to get back to regular activity   03/10/2013 f/u ov/Oris Calmes cc better overall off spiriva, avg use of duoneb 2-3 x per week when "overdoes it", seems to help. Minimal cough > white mucus, comes up better p neb - not really limited from any desired activity   No obvious daytime variabilty or assoc c cp or chest tightness, subjective wheeze overt sinus or hb symptoms. No unusual exp hx or h/o childhood pna/ asthma or premature birth to his knowledge.   Sleeping ok on cpap without nocturnal  or early am exacerbation  of respiratory  c/o's or need for noct saba. Also denies any obvious fluctuation of symptoms with weather or environmental changes or other aggravating or alleviating factors except as outlined above      ROS  The following are not active complaints unless bolded sore throat, dysphagia, dental problems, itching, sneezing,  nasal congestion or excess/ purulent secretions, ear ache,   fever, chills, sweats, unintended wt loss, pleuritic or exertional cp, hemoptysis,  orthopnea pnd or leg swelling, presyncope, palpitations, heartburn, abdominal pain, anorexia, nausea, vomiting, diarrhea  or change in bowel or urinary habits, change in stools or urine, dysuria,hematuria,  rash, arthralgias, visual complaints, headache,  numbness weakness or ataxia or problems with walking or coordination,  change in mood/affect or memory.        Objective:   Physical Exam  amb obese wm nad 03/10/2013       264 Wt Readings from Last 3 Encounters:  01/22/13 264 lb 12.8 oz (120.112 kg)  12/19/12 268 lb 12.8 oz (121.927 kg)  07/16/11 268 lb (121.564 kg)   HEENT mild turbinate edema.  Oropharynx no thrush or excess pnd or cobblestoning.  No JVD or cervical adenopathy. Mild accessory muscle hypertrophy. Trachea midline, nl thryroid. Chest was min hyperinflated by percussion with slt diminished breath sounds and no sign increased exp time without wheeze. Hoover sign positive at end of inspiration. Regular rate and rhythm without murmur gallop or rub or increase P2 or edema.  Abd: no hsm, nl excursion. Ext warm without cyanosis or clubbing.    cxr per Dr Lin Landsman ok w/in last sev months       Assessment & Plan:

## 2013-03-13 ENCOUNTER — Other Ambulatory Visit (HOSPITAL_BASED_OUTPATIENT_CLINIC_OR_DEPARTMENT_OTHER): Payer: Medicare Other | Admitting: Lab

## 2013-03-13 DIAGNOSIS — D509 Iron deficiency anemia, unspecified: Secondary | ICD-10-CM | POA: Diagnosis not present

## 2013-03-13 DIAGNOSIS — D649 Anemia, unspecified: Secondary | ICD-10-CM

## 2013-03-13 LAB — CBC WITH DIFFERENTIAL/PLATELET
Basophils Absolute: 0.1 10*3/uL (ref 0.0–0.1)
EOS%: 1.5 % (ref 0.0–7.0)
Eosinophils Absolute: 0.1 10*3/uL (ref 0.0–0.5)
HCT: 38.2 % — ABNORMAL LOW (ref 38.4–49.9)
HGB: 12.7 g/dL — ABNORMAL LOW (ref 13.0–17.1)
MCH: 29.2 pg (ref 27.2–33.4)
MCV: 88 fL (ref 79.3–98.0)
NEUT#: 7.4 10*3/uL — ABNORMAL HIGH (ref 1.5–6.5)
NEUT%: 75 % (ref 39.0–75.0)
RDW: 16.1 % — ABNORMAL HIGH (ref 11.0–14.6)
lymph#: 1.2 10*3/uL (ref 0.9–3.3)

## 2013-03-13 LAB — IRON AND TIBC
%SAT: 15 % — ABNORMAL LOW (ref 20–55)
Iron: 51 ug/dL (ref 42–165)
TIBC: 351 ug/dL (ref 215–435)
UIBC: 300 ug/dL (ref 125–400)

## 2013-03-17 ENCOUNTER — Telehealth: Payer: Self-pay | Admitting: *Deleted

## 2013-03-17 NOTE — Telephone Encounter (Signed)
Spoke w/ wife, gave lab results per Dr. Gaylyn Rong and informed anemia and iron improved since his past iron treatment.  Will mail labs to pt as requested.  Wife verbalized understanding.

## 2013-03-31 ENCOUNTER — Encounter: Payer: Self-pay | Admitting: Internal Medicine

## 2013-04-06 DIAGNOSIS — I10 Essential (primary) hypertension: Secondary | ICD-10-CM | POA: Diagnosis not present

## 2013-04-06 DIAGNOSIS — D485 Neoplasm of uncertain behavior of skin: Secondary | ICD-10-CM | POA: Diagnosis not present

## 2013-04-08 ENCOUNTER — Telehealth: Payer: Self-pay | Admitting: *Deleted

## 2013-04-08 NOTE — Telephone Encounter (Signed)
Patient called stated that Dr. Gaylyn Rong called him and canceled lab appt for tomorrow. Desk RN notified. JWM

## 2013-04-10 ENCOUNTER — Other Ambulatory Visit: Payer: Medicare Other | Admitting: Lab

## 2013-04-15 DIAGNOSIS — E785 Hyperlipidemia, unspecified: Secondary | ICD-10-CM | POA: Diagnosis not present

## 2013-04-15 DIAGNOSIS — I739 Peripheral vascular disease, unspecified: Secondary | ICD-10-CM | POA: Diagnosis not present

## 2013-04-15 DIAGNOSIS — E119 Type 2 diabetes mellitus without complications: Secondary | ICD-10-CM | POA: Diagnosis not present

## 2013-04-15 DIAGNOSIS — G4733 Obstructive sleep apnea (adult) (pediatric): Secondary | ICD-10-CM | POA: Diagnosis not present

## 2013-04-15 DIAGNOSIS — I10 Essential (primary) hypertension: Secondary | ICD-10-CM | POA: Diagnosis not present

## 2013-04-15 DIAGNOSIS — I2789 Other specified pulmonary heart diseases: Secondary | ICD-10-CM | POA: Diagnosis not present

## 2013-05-08 ENCOUNTER — Other Ambulatory Visit: Payer: Medicare Other | Admitting: Lab

## 2013-05-27 ENCOUNTER — Telehealth: Payer: Self-pay | Admitting: Oncology

## 2013-06-05 ENCOUNTER — Other Ambulatory Visit: Payer: Medicare Other | Admitting: Lab

## 2013-06-05 ENCOUNTER — Ambulatory Visit: Payer: Medicare Other | Admitting: Oncology

## 2013-06-09 ENCOUNTER — Ambulatory Visit (HOSPITAL_BASED_OUTPATIENT_CLINIC_OR_DEPARTMENT_OTHER): Payer: Medicare Other | Admitting: Oncology

## 2013-06-09 ENCOUNTER — Encounter: Payer: Self-pay | Admitting: Oncology

## 2013-06-09 ENCOUNTER — Telehealth: Payer: Self-pay | Admitting: Oncology

## 2013-06-09 ENCOUNTER — Other Ambulatory Visit (HOSPITAL_BASED_OUTPATIENT_CLINIC_OR_DEPARTMENT_OTHER): Payer: Medicare Other | Admitting: Lab

## 2013-06-09 VITALS — BP 152/66 | HR 59 | Temp 97.5°F | Resp 18 | Ht 70.0 in | Wt 263.7 lb

## 2013-06-09 DIAGNOSIS — D649 Anemia, unspecified: Secondary | ICD-10-CM | POA: Diagnosis not present

## 2013-06-09 DIAGNOSIS — D509 Iron deficiency anemia, unspecified: Secondary | ICD-10-CM | POA: Diagnosis not present

## 2013-06-09 LAB — CBC WITH DIFFERENTIAL/PLATELET
BASO%: 1.4 % (ref 0.0–2.0)
Basophils Absolute: 0.1 10*3/uL (ref 0.0–0.1)
EOS%: 2.6 % (ref 0.0–7.0)
HCT: 37.6 % — ABNORMAL LOW (ref 38.4–49.9)
HGB: 12.4 g/dL — ABNORMAL LOW (ref 13.0–17.1)
LYMPH%: 17.2 % (ref 14.0–49.0)
MCH: 28.4 pg (ref 27.2–33.4)
MCHC: 33.1 g/dL (ref 32.0–36.0)
MCV: 85.7 fL (ref 79.3–98.0)
MONO%: 12.3 % (ref 0.0–14.0)
NEUT%: 66.5 % (ref 39.0–75.0)
Platelets: 229 10*3/uL (ref 140–400)

## 2013-06-09 LAB — COMPREHENSIVE METABOLIC PANEL (CC13)
Albumin: 3.4 g/dL — ABNORMAL LOW (ref 3.5–5.0)
Alkaline Phosphatase: 136 U/L (ref 40–150)
BUN: 30.4 mg/dL — ABNORMAL HIGH (ref 7.0–26.0)
Calcium: 9.2 mg/dL (ref 8.4–10.4)
Chloride: 108 mEq/L — ABNORMAL HIGH (ref 98–107)
Glucose: 235 mg/dl — ABNORMAL HIGH (ref 70–99)
Potassium: 4.6 mEq/L (ref 3.5–5.1)
Sodium: 140 mEq/L (ref 136–145)
Total Protein: 6.7 g/dL (ref 6.4–8.3)

## 2013-06-09 LAB — IRON AND TIBC: TIBC: 378 ug/dL (ref 215–435)

## 2013-06-09 NOTE — Patient Instructions (Signed)
Results for SHERVIN, CYPERT (MRN [id number]) as of 06/09/2013 13:08  Ref. Range 06/09/2013 12:18  WBC No range found 8.7  RBC No range found 4.38  Hemoglobin No range found 12.4 (L)  HCT No range found 37.6 (L)  MCV No range found 85.7  MCH Latest Range: 27.2-33.4 pg 28.4  MCHC No range found 33.1  RDW No range found 15.8 (H)  Platelets No range found 229

## 2013-06-09 NOTE — Progress Notes (Signed)
[facility]  Telephone:([phone]) [phone] [phone]   OFFICE PROGRESS NOTE   Cc:  Durenda Hurt F., MD  DIAGNOSIS: Iron deficiency anemia  PAST THERAPY: Feraheme 1,020 mg IV given on 12/19/2012  CURRENT THERAPY: Watchful observation  INTERVAL HISTORY: Lawrence [name] 67 y.o. male returns for routine followup with his wife. Received IV iron in December 2013 and had a significant improvement in his fatigue, dizziness, and dyspnea on exertion. States that he continues to do well. Denies chest pain, shortness of breath, abdominal pain, nausea, vomiting. He no longer has ice pica. Denies epistaxis, hemoptysis, hematemesis, hematuria, melena, and hematochezia.  Past Medical History  Diagnosis Date  . Chronic ischemic heart disease, unspecified   . Other testicular hypofunction   . Chronic airway obstruction, not elsewhere classified   . Renal insufficiency, mild     hx of  . PUD (peptic ulcer disease)   . Obesity   . Nontoxic multinodular goiter   . Diabetes mellitus     type II, neuropathy,   . Coronary artery disease   . Hyperlipidemia, mixed   . Essential hypertension, benign   . Anemia   . Nodule of kidney     incidentally found on CT abdomen 11/2012.  Pending Urology evaluation.     Past Surgical History  Procedure Laterality Date  . Cholecystectomy    . Shoulder surgery      left shoulder  . Coronary artery bypass graft  2007    x 4  . Tonsilectomy, adenoidectomy, bilateral myringotomy and tubes    . Esophagogastroduodenoscopy      10/06/2012:  hiatal hernia, moderate gastritis.  2012: Colonoscopy at [facility]:  one polyp.  Due next 2017    Current Outpatient Prescriptions  Medication Sig Dispense Refill  . metFORMIN (GLUCOPHAGE) 850 MG tablet Take 850 mg by mouth 2 (two) times daily with a meal.      . allopurinol (ZYLOPRIM) 100 MG tablet Take 100 mg by mouth 2 (two) times daily.      Marland Kitchen amLODipine (NORVASC) 5 MG tablet Take 5 mg by mouth daily.         . clopidogrel (PLAVIX) 75 MG tablet Take 75 mg by mouth daily.      Marland Kitchen gabapentin (NEURONTIN) 300 MG capsule Take 300 mg by mouth 2 (two) times daily. 4 tablets twice a day      . Ipratropium-Albuterol (COMBIVENT RESPIMAT) 20-100 MCG/ACT AERS respimat Inhale 1 puff into the lungs every 6 (six) hours.  1 Inhaler  11  . ipratropium-albuterol (DUONEB) 0.5-2.5 (3) MG/3ML SOLN Take 3 mLs by nebulization every 4 (four) hours as needed.      Marland Kitchen lisinopril (PRINIVIL,ZESTRIL) 5 MG tablet Take 5 mg by mouth daily.      . meloxicam (MOBIC) 15 MG tablet Take 15 mg by mouth daily. As needed      . nebivolol (BYSTOLIC) 5 MG tablet Take 5 mg by mouth daily.      . nitroGLYCERIN (NITROSTAT) 0.4 MG SL tablet Place 0.4 mg under the tongue every 5 (five) minutes as needed.        Marland Kitchen omeprazole (PRILOSEC) 40 MG capsule 20 mg. 1 capsule by mouth two times daily before meals      . pravastatin (PRAVACHOL) 80 MG tablet Take 80 mg by mouth at bedtime.        . tacrolimus (PROTOPIC) 0.1 % ointment Apply topically daily as needed.        . tadalafil (  CIALIS) 20 MG tablet Take 20 mg by mouth daily as needed.       No current facility-administered medications for this visit.    ALLERGIES:  has No Known Allergies.  REVIEW OF SYSTEMS:  The rest of the 14-point review of system was negative.   Filed Vitals:   06/09/13 1310  BP: 152/66  Pulse: 59  Temp: 97.5 F (36.4 C)  Resp: 18   Wt Readings from Last 3 Encounters:  06/09/13 263 lb 11.2 oz (119.614 kg)  03/10/13 264 lb (119.75 kg)  01/22/13 264 lb 12.8 oz (120.112 kg)   ECOG Performance status: 1  PHYSICAL EXAMINATION:   General:  well-nourished in no acute distress.  Eyes:  no scleral icterus.  ENT:  There were no oropharyngeal lesions.  Neck was without thyromegaly.  Lymphatics:  Negative cervical, supraclavicular or axillary adenopathy.  Respiratory: lungs were clear bilaterally without wheezing or crackles.  Cardiovascular:  Regular rate and rhythm,  S1/S2, without murmur, rub or gallop.  There was no pedal edema.  GI:  abdomen was soft, flat, nontender, nondistended, without organomegaly.  Muscoloskeletal:  no spinal tenderness of palpation of vertebral spine.  Skin exam was without echymosis, petichae.  Neuro exam was nonfocal.  Patient was able to get on and off exam table without assistance.  Gait was normal.  Patient was alerted and oriented.  Attention was good.   Language was appropriate.  Mood was normal without depression.  Speech was not pressured.  Thought content was not tangential.     LABORATORY/RADIOLOGY DATA:  Lab Results  Component Value Date   WBC 8.7 06/09/2013   HGB 12.4* 06/09/2013   HCT 37.6* 06/09/2013   PLT 229 06/09/2013   GLUCOSE 235* 06/09/2013   ALKPHOS 136 06/09/2013   ALT 14 06/09/2013   AST 19 06/09/2013   NA 140 06/09/2013   K 4.6 06/09/2013   CL 108* 06/09/2013   CREATININE 1.4* 06/09/2013   BUN 30.4* 06/09/2013   CO2 21* 06/09/2013   INR 1.1 01/21/2008    ASSESSMENT AND PLAN:   1. Gout: He is on allopurinol.  2. Hypertension: He is on amlodipine, Lisinopril, nebivolol.  3. Hyperlipidemia: He is on pravastatin.  4. COPD: On inhalers.  5. CAD: on aspirin, Plavix, nebivolol, lisinopril; pravastatin.  6. Microcytic anemia with iron deficiency:  - Cause: Unknown (malabsorption vs small bowel AVM?) Pending GI work up. Reportedly negative EGD and CT scan. Capsule endoscopy was negative.  - Treatment: Status post Feraheme with significant improvement in his hemoglobin. Iron studies are pending today. He could not tolerate oral iron due to severe dyspepsia. Recommend continued observation. We will check a CBC every other month. 7. Renal nodule: Incidental finding on CT scan. Pending Urology work up.  8. Follow up: Lab every other month and a return visit in about 6 months.      The length of time of the face-to-face encounter was 15 minutes. More than 50% of time was spent counseling and coordination of care.

## 2013-06-09 NOTE — Telephone Encounter (Signed)
gvv and printed appt sched and avs for pt.

## 2013-06-11 ENCOUNTER — Telehealth: Payer: Self-pay | Admitting: *Deleted

## 2013-06-11 ENCOUNTER — Other Ambulatory Visit: Payer: Self-pay | Admitting: Oncology

## 2013-06-11 ENCOUNTER — Telehealth: Payer: Self-pay | Admitting: Oncology

## 2013-06-11 ENCOUNTER — Telehealth: Payer: Self-pay

## 2013-06-11 NOTE — Telephone Encounter (Signed)
Per staff message and POF I have scheduled appts.  JMW  

## 2013-06-11 NOTE — Telephone Encounter (Signed)
s.w. pt and advised on 6.25.14 appt....ok and aware

## 2013-06-11 NOTE — Telephone Encounter (Signed)
Message copied by Kallie Locks on Thu Jun 11, 2013  4:39 PM ------      Message from: Myrtis Ser      Created: Thu Jun 11, 2013  1:27 PM       Dorene Grebe            Can you please let patient know that Dr Gaylyn Rong wants him to receive IV iron again. Ferritin was 15. I have sent POF for scheduling to call him with appt.            Thanks      ----- Message -----         From: Exie Parody, MD         Sent: 06/11/2013  11:32 AM           To: Myrtis Ser, NP            Please arrange for him to get IV Feraheme again.  Thanks.       ------

## 2013-06-12 ENCOUNTER — Telehealth: Payer: Self-pay

## 2013-06-12 NOTE — Telephone Encounter (Signed)
Message copied by Kallie Locks on Fri Jun 12, 2013  9:12 AM ------      Message from: Clenton Pare R      Created: Thu Jun 11, 2013  1:27 PM       Dorene Grebe            Can you please let patient know that Dr Gaylyn Rong wants him to receive IV iron again. Ferritin was 15. I have sent POF for scheduling to call him with appt.            Thanks      ----- Message -----         From: Exie Parody, MD         Sent: 06/11/2013  11:32 AM           To: Myrtis Ser, NP            Please arrange for him to get IV Feraheme again.  Thanks.       ------

## 2013-06-17 ENCOUNTER — Ambulatory Visit (HOSPITAL_BASED_OUTPATIENT_CLINIC_OR_DEPARTMENT_OTHER): Payer: Medicare Other

## 2013-06-17 VITALS — BP 162/67 | HR 65 | Temp 98.0°F | Resp 20

## 2013-06-17 DIAGNOSIS — D649 Anemia, unspecified: Secondary | ICD-10-CM

## 2013-06-17 DIAGNOSIS — D509 Iron deficiency anemia, unspecified: Secondary | ICD-10-CM

## 2013-06-17 MED ORDER — SODIUM CHLORIDE 0.9 % IV SOLN
Freq: Once | INTRAVENOUS | Status: AC
  Administered 2013-06-17: 16:00:00 via INTRAVENOUS

## 2013-06-17 MED ORDER — SODIUM CHLORIDE 0.9 % IV SOLN
1020.0000 mg | Freq: Once | INTRAVENOUS | Status: AC
  Administered 2013-06-17: 1020 mg via INTRAVENOUS
  Filled 2013-06-17: qty 34

## 2013-06-17 NOTE — Patient Instructions (Addendum)
Ferumoxytol injection What is this medicine? FERUMOXYTOL is an iron complex. Iron is used to make healthy red blood cells, which carry oxygen and nutrients throughout the body. This medicine is used to treat iron deficiency anemia in people with chronic kidney disease. This medicine may be used for other purposes; ask your health care provider or pharmacist if you have questions. What should I tell my health care provider before I take this medicine? They need to know if you have any of these conditions: -anemia not caused by low iron levels -high levels of iron in the blood -magnetic resonance imaging (MRI) test scheduled -an unusual or allergic reaction to iron, other medicines, foods, dyes, or preservatives -pregnant or trying to get pregnant -breast-feeding How should I use this medicine? This medicine is for infusion into a vein. It is given by a health care professional in a hospital or clinic setting. Talk to your pediatrician regarding the use of this medicine in children. Special care may be needed. Overdosage: If you think you've taken too much of this medicine contact a poison control center or emergency room at once. Overdosage: If you think you have taken too much of this medicine contact a poison control center or emergency room at once. NOTE: This medicine is only for you. Do not share this medicine with others. What if I miss a dose? It is important not to miss your dose. Call your doctor or health care professional if you are unable to keep an appointment. What may interact with this medicine? This medicine may interact with the following medications: -other iron products This list may not describe all possible interactions. Give your health care provider a list of all the medicines, herbs, non-prescription drugs, or dietary supplements you use. Also tell them if you smoke, drink alcohol, or use illegal drugs. Some items may interact with your medicine. What should I watch  for while using this medicine? Visit your doctor or healthcare professional regularly. Tell your doctor or healthcare professional if your symptoms do not start to get better or if they get worse. You may need blood work done while you are taking this medicine. You may need to follow a special diet. Talk to your doctor. Foods that contain iron include: whole grains/cereals, dried fruits, beans, or peas, leafy green vegetables, and organ meats (liver, kidney). What side effects may I notice from receiving this medicine? Side effects that you should report to your doctor or health care professional as soon as possible: -allergic reactions like skin rash, itching or hives, swelling of the face, lips, or tongue -breathing problems -changes in blood pressure -feeling faint or lightheaded, falls -fever or chills -flushing, sweating, or hot feelings -swelling of the ankles or feet Side effects that usually do not require medical attention (Report these to your doctor or health care professional if they continue or are bothersome.): -diarrhea -headache -nausea, vomiting -stomach pain This list may not describe all possible side effects. Call your doctor for medical advice about side effects. You may report side effects to FDA at 1-800-FDA-1088. Where should I keep my medicine? This drug is given in a hospital or clinic and will not be stored at home. NOTE: This sheet is a summary. It may not cover all possible information. If you have questions about this medicine, talk to your doctor, pharmacist, or health care provider.  2012, [employer]vier/Gold Standard. (09/01/2008 9:48:25 PM) 

## 2013-06-18 ENCOUNTER — Ambulatory Visit: Payer: Medicare Other

## 2013-08-04 ENCOUNTER — Other Ambulatory Visit: Payer: Medicare Other

## 2013-09-14 ENCOUNTER — Telehealth: Payer: Self-pay | Admitting: Internal Medicine

## 2013-09-14 NOTE — Telephone Encounter (Signed)
, °

## 2013-09-15 ENCOUNTER — Telehealth: Payer: Self-pay | Admitting: Oncology

## 2013-09-15 ENCOUNTER — Other Ambulatory Visit (HOSPITAL_BASED_OUTPATIENT_CLINIC_OR_DEPARTMENT_OTHER): Payer: Medicare Other | Admitting: Lab

## 2013-09-15 DIAGNOSIS — D649 Anemia, unspecified: Secondary | ICD-10-CM | POA: Diagnosis not present

## 2013-09-15 LAB — CBC WITH DIFFERENTIAL/PLATELET
Basophils Absolute: 0.1 10*3/uL (ref 0.0–0.1)
Eosinophils Absolute: 0.1 10*3/uL (ref 0.0–0.5)
HGB: 14.1 g/dL (ref 13.0–17.1)
MCV: 90.5 fL (ref 79.3–98.0)
MONO#: 0.9 10*3/uL (ref 0.1–0.9)
MONO%: 9 % (ref 0.0–14.0)
NEUT#: 7.2 10*3/uL — ABNORMAL HIGH (ref 1.5–6.5)
RBC: 4.61 10*6/uL (ref 4.20–5.82)
RDW: 15.2 % — ABNORMAL HIGH (ref 11.0–14.6)
WBC: 10 10*3/uL (ref 4.0–10.3)
lymph#: 1.6 10*3/uL (ref 0.9–3.3)

## 2013-09-15 NOTE — Telephone Encounter (Signed)
pt had labs today.Marland KitchenMarland KitchenMarland KitchenMarland Kitchenper Dr. Fawn Kirk pt needs to com every other mth....deleted OCT appt.Marland KitchenMarland Kitchen

## 2013-09-25 ENCOUNTER — Telehealth: Payer: Self-pay | Admitting: Medical Oncology

## 2013-09-25 NOTE — Telephone Encounter (Signed)
Pt called to inform him that his Hgb was normal 09/15/13. We will continue to observe him. He voiced understanding.

## 2013-09-29 ENCOUNTER — Other Ambulatory Visit: Payer: Medicare Other

## 2013-11-09 DIAGNOSIS — I10 Essential (primary) hypertension: Secondary | ICD-10-CM | POA: Diagnosis not present

## 2013-11-09 DIAGNOSIS — I2789 Other specified pulmonary heart diseases: Secondary | ICD-10-CM | POA: Diagnosis not present

## 2013-11-09 DIAGNOSIS — E785 Hyperlipidemia, unspecified: Secondary | ICD-10-CM | POA: Diagnosis not present

## 2013-11-09 DIAGNOSIS — G4733 Obstructive sleep apnea (adult) (pediatric): Secondary | ICD-10-CM | POA: Diagnosis not present

## 2013-11-09 DIAGNOSIS — I739 Peripheral vascular disease, unspecified: Secondary | ICD-10-CM | POA: Diagnosis not present

## 2013-11-09 DIAGNOSIS — E119 Type 2 diabetes mellitus without complications: Secondary | ICD-10-CM | POA: Diagnosis not present

## 2013-11-24 ENCOUNTER — Telehealth: Payer: Self-pay | Admitting: Internal Medicine

## 2013-11-24 ENCOUNTER — Telehealth: Payer: Self-pay | Admitting: *Deleted

## 2013-11-24 ENCOUNTER — Other Ambulatory Visit (HOSPITAL_BASED_OUTPATIENT_CLINIC_OR_DEPARTMENT_OTHER): Payer: Medicare Other | Admitting: Lab

## 2013-11-24 ENCOUNTER — Ambulatory Visit (HOSPITAL_BASED_OUTPATIENT_CLINIC_OR_DEPARTMENT_OTHER): Payer: Medicare Other | Admitting: Internal Medicine

## 2013-11-24 ENCOUNTER — Other Ambulatory Visit: Payer: Self-pay | Admitting: Internal Medicine

## 2013-11-24 VITALS — BP 160/65 | HR 63 | Temp 97.7°F | Resp 18 | Ht 70.0 in | Wt 267.2 lb

## 2013-11-24 DIAGNOSIS — D649 Anemia, unspecified: Secondary | ICD-10-CM

## 2013-11-24 DIAGNOSIS — I2510 Atherosclerotic heart disease of native coronary artery without angina pectoris: Secondary | ICD-10-CM

## 2013-11-24 DIAGNOSIS — J449 Chronic obstructive pulmonary disease, unspecified: Secondary | ICD-10-CM

## 2013-11-24 DIAGNOSIS — D509 Iron deficiency anemia, unspecified: Secondary | ICD-10-CM

## 2013-11-24 DIAGNOSIS — N289 Disorder of kidney and ureter, unspecified: Secondary | ICD-10-CM | POA: Diagnosis not present

## 2013-11-24 DIAGNOSIS — I10 Essential (primary) hypertension: Secondary | ICD-10-CM | POA: Diagnosis not present

## 2013-11-24 LAB — CBC WITH DIFFERENTIAL/PLATELET
Basophils Absolute: 0.1 10*3/uL (ref 0.0–0.1)
EOS%: 2.3 % (ref 0.0–7.0)
Eosinophils Absolute: 0.2 10*3/uL (ref 0.0–0.5)
HCT: 40 % (ref 38.4–49.9)
HGB: 13.1 g/dL (ref 13.0–17.1)
MCH: 30.7 pg (ref 27.2–33.4)
MCV: 93.5 fL (ref 79.3–98.0)
MONO%: 8.1 % (ref 0.0–14.0)
NEUT#: 7.4 10*3/uL — ABNORMAL HIGH (ref 1.5–6.5)
NEUT%: 76 % — ABNORMAL HIGH (ref 39.0–75.0)
RBC: 4.28 10*6/uL (ref 4.20–5.82)
RDW: 14.1 % (ref 11.0–14.6)

## 2013-11-24 LAB — COMPREHENSIVE METABOLIC PANEL (CC13)
Albumin: 3.6 g/dL (ref 3.5–5.0)
Alkaline Phosphatase: 148 U/L (ref 40–150)
BUN: 23.5 mg/dL (ref 7.0–26.0)
Calcium: 9.3 mg/dL (ref 8.4–10.4)
Chloride: 105 mEq/L (ref 98–109)
Creatinine: 1.3 mg/dL (ref 0.7–1.3)
Glucose: 397 mg/dl — ABNORMAL HIGH (ref 70–140)
Potassium: 4.4 mEq/L (ref 3.5–5.1)

## 2013-11-24 LAB — IRON AND TIBC CHCC
%SAT: 17 % — ABNORMAL LOW (ref 20–55)
Iron: 58 ug/dL (ref 42–163)
TIBC: 339 ug/dL (ref 202–409)
UIBC: 280 ug/dL (ref 117–376)

## 2013-11-24 LAB — FERRITIN CHCC: Ferritin: 74 ng/mL (ref 22–316)

## 2013-11-24 MED ORDER — FERUMOXYTOL INJECTION 510 MG/17 ML
510.0000 mg | Freq: Once | INTRAVENOUS | Status: AC
  Administered 2013-11-24: 510 mg via INTRAVENOUS
  Filled 2013-11-24: qty 17

## 2013-11-24 NOTE — Patient Instructions (Signed)
Ferumoxytol injection What is this medicine? FERUMOXYTOL is an iron complex. Iron is used to make healthy red blood cells, which carry oxygen and nutrients throughout the body. This medicine is used to treat iron deficiency anemia in people with chronic kidney disease. This medicine may be used for other purposes; ask your health care provider or pharmacist if you have questions. COMMON BRAND NAME(S): Feraheme  What should I tell my health care provider before I take this medicine? They need to know if you have any of these conditions: -anemia not caused by low iron levels -high levels of iron in the blood -magnetic resonance imaging (MRI) test scheduled -an unusual or allergic reaction to iron, other medicines, foods, dyes, or preservatives -pregnant or trying to get pregnant -breast-feeding How should I use this medicine? This medicine is for injection into a vein. It is given by a health care professional in a hospital or clinic setting. Talk to your pediatrician regarding the use of this medicine in children. Special care may be needed. Overdosage: If you think you've taken too much of this medicine contact a poison control center or emergency room at once. Overdosage: If you think you have taken too much of this medicine contact a poison control center or emergency room at once. NOTE: This medicine is only for you. Do not share this medicine with others. What if I miss a dose? It is important not to miss your dose. Call your doctor or health care professional if you are unable to keep an appointment. What may interact with this medicine? This medicine may interact with the following medications: -other iron products This list may not describe all possible interactions. Give your health care provider a list of all the medicines, herbs, non-prescription drugs, or dietary supplements you use. Also tell them if you smoke, drink alcohol, or use illegal drugs. Some items may interact with your  medicine. What should I watch for while using this medicine? Visit your doctor or healthcare professional regularly. Tell your doctor or healthcare professional if your symptoms do not start to get better or if they get worse. You may need blood work done while you are taking this medicine. You may need to follow a special diet. Talk to your doctor. Foods that contain iron include: whole grains/cereals, dried fruits, beans, or peas, leafy green vegetables, and organ meats (liver, kidney). What side effects may I notice from receiving this medicine? Side effects that you should report to your doctor or health care professional as soon as possible: -allergic reactions like skin rash, itching or hives, swelling of the face, lips, or tongue -breathing problems -changes in blood pressure -feeling faint or lightheaded, falls -fever or chills -flushing, sweating, or hot feelings -swelling of the ankles or feet Side effects that usually do not require medical attention (Report these to your doctor or health care professional if they continue or are bothersome.): -diarrhea -headache -nausea, vomiting -stomach pain This list may not describe all possible side effects. Call your doctor for medical advice about side effects. You may report side effects to FDA at 1-800-FDA-1088. Where should I keep my medicine? This drug is given in a hospital or clinic and will not be stored at home. NOTE: This sheet is a summary. It may not cover all possible information. If you have questions about this medicine, talk to your doctor, pharmacist, or health care provider.  2014, Elsevier/Gold Standard. (2012-07-25 15:23:36) Ferritin This is done to test for anemia. Anemia occurs when the amount  of hemoglobin (found in the red blood cells) drops below normal. Hemoglobin is necessary for the transportation of oxygen throughout the body. Blood tests may show a variety of common, treatable abnormalities that can lead to  problems associated with anemia. Iron deficiency anemia is the most common of the anemias. It is usually due to bleeding. In women, iron deficiency may be due to heavy menstrual periods. In older women and in men, the bleeding is usually from disease of the intestines. In children and in pregnant women, the body needs more iron. Iron deficiency may be due to simply not eating enough iron in the diet. Iron deficiency may also result from some extreme diets. Treatment of iron deficiency usually involves iron supplements. In older women and in men, there is usually some further testing needed to determine why the person is iron deficient.  PREPARATION FOR TEST A blood sample is obtained by inserting a needle into a vein in the arm. NORMAL FINDINGS Male: 12-300ng/ml or 12-300  g/L (SI units) Male:  10-150 ng/ml or 10-150  g/L (SI units) Children/adolescent:  Newborn: 25-200 ng/ml  1 month: 200-600 ng/ml  2-5 months: 50-200 ng/ml  6 months-15 years: 7-142 ng/ml Ranges for normal findings may vary among different laboratories and hospitals. You should always check with your doctor after having lab work or other tests done to discuss the meaning of your test results and whether your values are considered within normal limits. MEANING OF TEST  Your caregiver will go over the test results with you and discuss the importance and meaning of your results, as well as treatment options and the need for additional tests if necessary. OBTAINING THE TEST RESULTS  It is your responsibility to obtain your test results. Ask the lab or department performing the test when and how you will get your results. Document Released: 01/02/2005 Document Revised: 03/03/2012 Document Reviewed: 11/19/2008 [facility] Patient Information 2014 [place], [place]. Anemia, Nonspecific Anemia is a condition in which the concentration of red blood cells or hemoglobin in the blood is below normal. Hemoglobin is a substance in red blood  cells that carries oxygen to the tissues of the body. Anemia results in not enough oxygen reaching these tissues.  CAUSES  Common causes of anemia include:   Excessive bleeding. Bleeding may be internal or external. This includes excessive bleeding from periods (in women) or from the intestine.   Poor nutrition.   Chronic kidney, thyroid, and liver disease.  Bone marrow disorders that decrease red blood cell production.  Cancer and treatments for cancer.  HIV, AIDS, and their treatments.  Spleen problems that increase red blood cell destruction.  Blood disorders.  Excess destruction of red blood cells due to infection, medicines, and autoimmune disorders. SIGNS AND SYMPTOMS   Minor weakness.   Dizziness.   Headache.  Palpitations.   Shortness of breath, especially with exercise.   Paleness.  Cold sensitivity.  Indigestion.  Nausea.  Difficulty sleeping.  Difficulty concentrating. Symptoms may occur suddenly or they may develop slowly.  DIAGNOSIS  Additional blood tests are often needed. These help your health care provider determine the best treatment. Your health care provider will check your stool for blood and look for other causes of blood loss.  TREATMENT  Treatment varies depending on the cause of the anemia. Treatment can include:   Supplements of iron, vitamin B12, or folic acid.   Hormone medicines.   A blood transfusion. This may be needed if blood loss is severe.   Hospitalization.  This may be needed if there is significant continual blood loss.   Dietary changes.  Spleen removal. HOME CARE INSTRUCTIONS Keep all follow-up appointments. It often takes many weeks to correct anemia, and having your health care provider check on your condition and your response to treatment is very important. SEEK IMMEDIATE MEDICAL CARE IF:   You develop extreme weakness, shortness of breath, or chest pain.   You become dizzy or have trouble  concentrating.  You develop heavy vaginal bleeding.   You develop a rash.   You have bloody or black, tarry stools.   You faint.   You vomit up blood.   You vomit repeatedly.   You have abdominal pain.  You have a fever or persistent symptoms for more than 2 3 days.   You have a fever and your symptoms suddenly get worse.   You are dehydrated.  MAKE SURE YOU:  Understand these instructions.  Will watch your condition.  Will get help right away if you are not doing well or get worse. Document Released: 01/17/2005 Document Revised: 08/12/2013 Document Reviewed: 06/05/2013 [facility] Patient Information 2014 [place], [place].

## 2013-11-24 NOTE — Telephone Encounter (Signed)
Gave pt appt for lab and MD, emailed michelle regarding IV Iron after MD vsiit

## 2013-11-24 NOTE — Telephone Encounter (Signed)
Per staff message and POF I have scheduled appts.  JMW  

## 2013-11-25 ENCOUNTER — Telehealth: Payer: Self-pay | Admitting: Internal Medicine

## 2013-11-25 NOTE — Progress Notes (Signed)
[facility] OFFICE PROGRESS NOTE  Noni Saupe., MD [address] [place] [address]  DIAGNOSIS: Anemia - Plan: CBC with Differential, Comprehensive metabolic panel, Ferritin, Iron and TIBC, ferumoxytol (FERAHEME) injection 510 mg  Coronary artery disease  Morbid obesity  COPD (chronic obstructive pulmonary disease)  Chief Complaint  Patient presents with  . Anemia    PAST THERAPY: Feraheme 1,020 mg IV given on 12/19/2012, 06/17/2013  CURRENT THERAPY: Watchful observation.  INTERVAL HISTORY: Lawrence [name] 66 y.o. male returns for routine followup for his anemia with his wife Lawrence [name]. Received IV iron in December 2013 and on 05/28/2013 and had a significant improvement in his fatigue, dizziness, and dyspnea on exertion. States that he continues to do well but feeling more tired and states "I feel I might need somemore iron."  He denies chest pain, shortness of breath, abdominal pain, nausea,  vomiting. He no longer has ice pica. Denies epistaxis, hemoptysis, hematemesis, hematuria, melena, and hematochezia.   MEDICAL HISTORY: Past Medical History  Diagnosis Date  . Chronic ischemic heart disease, unspecified   . Other testicular hypofunction   . Chronic airway obstruction, not elsewhere classified   . Renal insufficiency, mild     hx of  . PUD (peptic ulcer disease)   . Obesity   . Nontoxic multinodular goiter   . Diabetes mellitus     type II, neuropathy,   . Coronary artery disease   . Hyperlipidemia, mixed   . Essential hypertension, benign   . Anemia   . Nodule of kidney     incidentally found on CT abdomen 11/2012.  Pending Urology evaluation.     INTERIM HISTORY: has Coronary artery disease; Hyperlipidemia, mixed; Essential hypertension, benign; Anemia; COPD (chronic obstructive pulmonary disease); and Morbid obesity on his problem list.    ALLERGIES:  has No Known Allergies.  MEDICATIONS: has a current medication list which includes  the following prescription(s): allopurinol, amlodipine, clopidogrel, gabapentin, glipizide, ipratropium-albuterol, ipratropium-albuterol, lisinopril, meloxicam, metformin, nebivolol, nitroglycerin, omeprazole, pravastatin, tacrolimus, and tadalafil.  SURGICAL HISTORY:  Past Surgical History  Procedure Laterality Date  . Cholecystectomy    . Shoulder surgery      left shoulder  . Coronary artery bypass graft  2007    x 4  . Tonsilectomy, adenoidectomy, bilateral myringotomy and tubes    . Esophagogastroduodenoscopy      10/06/2012:  hiatal hernia, moderate gastritis.  2012: Colonoscopy at [facility]:  one polyp.  Due next 2017    REVIEW OF SYSTEMS:   Constitutional: Denies fevers, chills or abnormal weight loss Eyes: Denies blurriness of vision Ears, nose, mouth, throat, and face: Denies mucositis or sore throat Respiratory: Denies cough, dyspnea or wheezes Cardiovascular: Denies palpitation, chest discomfort or lower extremity swelling Gastrointestinal:  Denies nausea, heartburn or change in bowel habits Skin: Denies abnormal skin rashes Lymphatics: Denies new lymphadenopathy or easy bruising Neurological:Denies numbness, tingling or new weaknesses Behavioral/Psych: Mood is stable, no new changes  All other systems were reviewed with the patient and are negative.  PHYSICAL EXAMINATION: ECOG PERFORMANCE STATUS: 0 - Asymptomatic  Blood pressure 160/65, pulse 63, temperature 97.7 F (36.5 C), temperature source Oral, resp. rate 18, height 5\' 10"  (1.778 m), weight 267 lb 3.2 oz (121.201 kg).  GENERAL:alert, no distress and comfortable; Morbidly obese but full ambulatory.  SKIN: skin color, texture, turgor are normal, no rashes or significant lesions EYES: normal, Conjunctiva are pink and non-injected, sclera clear OROPHARYNX:no exudate, no erythema and lips, buccal mucosa, and tongue  normal  NECK: supple, thyroid normal size, non-tender, without nodularity LYMPH:  no palpable  lymphadenopathy in the cervical, axillary or supraclavicular LUNGS: clear to auscultation and percussion with normal breathing effort HEART: regular rate & rhythm and no murmurs and no lower extremity edema ABDOMEN:abdomen soft, non-tender and normal bowel sounds Musculoskeletal:no cyanosis of digits and no clubbing  NEURO: alert & oriented x 3 with fluent speech, no focal motor/sensory deficits  LABORATORY DATA: Results for orders placed in visit on 11/24/13 (from the past 48 hour(s))  CBC WITH DIFFERENTIAL     Status: Abnormal   Collection Time    11/24/13 12:22 PM      Result Value Range   WBC 9.8  4.0 - 10.3 10e3/uL   NEUT# 7.4 (*) 1.5 - 6.5 10e3/uL   HGB 13.1  13.0 - 17.1 g/dL   HCT 29.5  62.1 - 30.8 %   Platelets 194  140 - 400 10e3/uL   MCV 93.5  79.3 - 98.0 fL   MCH 30.7  27.2 - 33.4 pg   MCHC 32.8  32.0 - 36.0 g/dL   RBC 6.57  8.46 - 9.62 10e6/uL   RDW 14.1  11.0 - 14.6 %   lymph# 1.3  0.9 - 3.3 10e3/uL   MONO# 0.8  0.1 - 0.9 10e3/uL   Eosinophils Absolute 0.2  0.0 - 0.5 10e3/uL   Basophils Absolute 0.1  0.0 - 0.1 10e3/uL   NEUT% 76.0 (*) 39.0 - 75.0 %   LYMPH% 12.9 (*) 14.0 - 49.0 %   MONO% 8.1  0.0 - 14.0 %   EOS% 2.3  0.0 - 7.0 %   BASO% 0.7  0.0 - 2.0 %  FERRITIN [facility]     Status: None   Collection Time    11/24/13 12:22 PM      Result Value Range   Ferritin 74  22 - 316 ng/ml  IRON AND TIBC [facility]     Status: Abnormal   Collection Time    11/24/13 12:22 PM      Result Value Range   Iron 58  42 - 163 ug/dL   TIBC 952  841 - 324 ug/dL   UIBC 401  027 - 253 ug/dL   %SAT 17 (*) 20 - 55 %  COMPREHENSIVE METABOLIC PANEL ([facility]13)     Status: Abnormal   Collection Time    11/24/13 12:22 PM      Result Value Range   Sodium 140  136 - 145 mEq/L   Potassium 4.4  3.5 - 5.1 mEq/L   Chloride 105  98 - 109 mEq/L   CO2 23  22 - 29 mEq/L   Glucose 397 (*) 70 - 140 mg/dl   BUN 66.4  7.0 - 40.3 mg/dL   Creatinine 1.3  0.7 - 1.3 mg/dL   Total Bilirubin 4.74  0.20 - 1.20  mg/dL   Alkaline Phosphatase 148  40 - 150 U/L   AST 26  5 - 34 U/L   ALT 17  0 - 55 U/L   Total Protein 6.8  6.4 - 8.3 g/dL   Albumin 3.6  3.5 - 5.0 g/dL   Calcium 9.3  8.4 - 25.9 mg/dL   Anion Gap 11  3 - 11 mEq/L    Labs:  Lab Results  Component Value Date   WBC 9.8 11/24/2013   HGB 13.1 11/24/2013   HCT 40.0 11/24/2013   MCV 93.5 11/24/2013   PLT 194 11/24/2013   NEUTROABS 7.4*  11/24/2013      Chemistry      Component Value Date/Time   NA 140 11/24/2013 1222   NA 131* 01/29/2008 0340   K 4.4 11/24/2013 1222   K 3.7 01/29/2008 0340   CL 108* 06/09/2013 1218   CL 100 01/29/2008 0340   CO2 23 11/24/2013 1222   CO2 25 01/29/2008 0340   BUN 23.5 11/24/2013 1222   BUN 22 01/29/2008 0340   CREATININE 1.3 11/24/2013 1222   CREATININE 1.09 01/29/2008 0340      Component Value Date/Time   CALCIUM 9.3 11/24/2013 1222   CALCIUM 8.4 01/29/2008 0340   ALKPHOS 148 11/24/2013 1222   AST 26 11/24/2013 1222   ALT 17 11/24/2013 1222   BILITOT 0.64 11/24/2013 1222     Basic Metabolic Panel:  Recent Labs Lab 11/24/13 1222  NA 140  K 4.4  CO2 23  GLUCOSE 397*  BUN 23.5  CREATININE 1.3  CALCIUM 9.3   GFR Estimated Creatinine Clearance: 71 ml/min (by C-G formula based on Cr of 1.3). Liver Function Tests:  Recent Labs Lab 11/24/13 1222  AST 26  ALT 17  ALKPHOS 148  BILITOT 0.64  PROT 6.8  ALBUMIN 3.6   CBC:  Recent Labs Lab 11/24/13 1222  WBC 9.8  NEUTROABS 7.4*  HGB 13.1  HCT 40.0  MCV 93.5  PLT 194   Anemia work up  Recent Labs  11/24/13 1222  FERRITIN 74  TIBC 339  IRON 58   Microbiology No results found for this or any previous visit (from the past 240 hour(s)).  Studies:  No results found.   RADIOGRAPHIC STUDIES: No results found.  ASSESSMENT: Lawrence [name] 67 y.o. male with a history of Anemia - Plan: CBC with Differential, Comprehensive metabolic panel, Ferritin, Iron and TIBC, ferumoxytol (FERAHEME) injection 510 mg  Coronary artery disease  Morbid  obesity  COPD (chronic obstructive pulmonary disease)   PLAN:  1. Gout: He is on allopurinol.  2. Hypertension: He is on amlodipine, Lisinopril, nebivolol.  3. Hyperlipidemia: He is on pravastatin.  4. COPD: On inhalers.  5. CAD: on aspirin, Plavix, nebivolol, lisinopril; pravastatin.  6. Microcytic anemia with iron deficiency:  - Cause: Unknown (malabsorption vs small bowel AVM?) GI work up unrevealing [name] far. Reportedly negative EGD and CT scan. Capsule endoscopy was negative.  - Treatment: Status post Feraheme with significant improvement in his hemoglobin. Iron studies are pending with mild decrease.  We gave him 510 mg of feraheme in the clinic.  Time spent setting up and arranging for his feraheme based on his symptoms of fatigue. He could not tolerate oral iron due to severe dyspepsia. We will check a CBC every other month.  7. Renal nodule: Incidental finding on CT scan. Pending Urology work up.  8. Follow up: Lab every other month (locally and results faxed to me) and a return visit in about 6 months.   All questions were answered. The patient knows to call the clinic with any problems, questions or concerns. We can certainly see the patient much sooner if necessary.  I spent 25 minutes counseling the patient face to face. The total time spent in the appointment was 40 minutes.    Cassy Sprowl, MD 11/25/2013 4:47 PM

## 2013-11-25 NOTE — Telephone Encounter (Signed)
Called pt and left mjessage regarding IV Iron on 12/5 and June 2015

## 2013-11-26 ENCOUNTER — Telehealth: Payer: Self-pay | Admitting: *Deleted

## 2013-11-26 NOTE — Telephone Encounter (Signed)
Patient's wife called regarding his appt for Friday. Per the desk RN the appt can be canceled. Treatment was given with MD on 12/3

## 2013-11-27 ENCOUNTER — Ambulatory Visit: Payer: Medicare Other

## 2013-12-21 DIAGNOSIS — I2789 Other specified pulmonary heart diseases: Secondary | ICD-10-CM | POA: Diagnosis not present

## 2013-12-25 DIAGNOSIS — D239 Other benign neoplasm of skin, unspecified: Secondary | ICD-10-CM | POA: Diagnosis not present

## 2013-12-25 DIAGNOSIS — M109 Gout, unspecified: Secondary | ICD-10-CM | POA: Diagnosis not present

## 2013-12-25 DIAGNOSIS — Z125 Encounter for screening for malignant neoplasm of prostate: Secondary | ICD-10-CM | POA: Diagnosis not present

## 2013-12-25 DIAGNOSIS — G4730 Sleep apnea, unspecified: Secondary | ICD-10-CM | POA: Diagnosis not present

## 2013-12-25 DIAGNOSIS — IMO0001 Reserved for inherently not codable concepts without codable children: Secondary | ICD-10-CM | POA: Diagnosis not present

## 2013-12-25 DIAGNOSIS — I10 Essential (primary) hypertension: Secondary | ICD-10-CM | POA: Diagnosis not present

## 2013-12-29 DIAGNOSIS — D485 Neoplasm of uncertain behavior of skin: Secondary | ICD-10-CM | POA: Diagnosis not present

## 2013-12-29 DIAGNOSIS — D239 Other benign neoplasm of skin, unspecified: Secondary | ICD-10-CM | POA: Diagnosis not present

## 2014-02-15 DIAGNOSIS — D539 Nutritional anemia, unspecified: Secondary | ICD-10-CM | POA: Diagnosis not present

## 2014-03-29 DIAGNOSIS — N529 Male erectile dysfunction, unspecified: Secondary | ICD-10-CM | POA: Diagnosis not present

## 2014-03-29 DIAGNOSIS — I10 Essential (primary) hypertension: Secondary | ICD-10-CM | POA: Diagnosis not present

## 2014-03-29 DIAGNOSIS — D539 Nutritional anemia, unspecified: Secondary | ICD-10-CM | POA: Diagnosis not present

## 2014-03-29 DIAGNOSIS — J309 Allergic rhinitis, unspecified: Secondary | ICD-10-CM | POA: Diagnosis not present

## 2014-05-05 ENCOUNTER — Telehealth: Payer: Self-pay | Admitting: Internal Medicine

## 2014-05-05 NOTE — Telephone Encounter (Signed)
returned pt call and lvm advised on first available appt...advised pt to call back to r/s

## 2014-05-20 DIAGNOSIS — E785 Hyperlipidemia, unspecified: Secondary | ICD-10-CM | POA: Diagnosis not present

## 2014-05-20 DIAGNOSIS — I2789 Other specified pulmonary heart diseases: Secondary | ICD-10-CM | POA: Diagnosis not present

## 2014-05-20 DIAGNOSIS — E119 Type 2 diabetes mellitus without complications: Secondary | ICD-10-CM | POA: Diagnosis not present

## 2014-05-20 DIAGNOSIS — G4733 Obstructive sleep apnea (adult) (pediatric): Secondary | ICD-10-CM | POA: Diagnosis not present

## 2014-05-20 DIAGNOSIS — I10 Essential (primary) hypertension: Secondary | ICD-10-CM | POA: Diagnosis not present

## 2014-05-25 ENCOUNTER — Telehealth: Payer: Self-pay | Admitting: Internal Medicine

## 2014-05-25 ENCOUNTER — Ambulatory Visit: Payer: Medicare Other

## 2014-05-25 ENCOUNTER — Other Ambulatory Visit (HOSPITAL_BASED_OUTPATIENT_CLINIC_OR_DEPARTMENT_OTHER): Payer: Medicare Other

## 2014-05-25 ENCOUNTER — Ambulatory Visit (HOSPITAL_BASED_OUTPATIENT_CLINIC_OR_DEPARTMENT_OTHER): Payer: Medicare Other | Admitting: Internal Medicine

## 2014-05-25 VITALS — BP 172/78 | HR 52 | Temp 97.0°F | Resp 18 | Ht 70.0 in | Wt 262.9 lb

## 2014-05-25 DIAGNOSIS — D649 Anemia, unspecified: Secondary | ICD-10-CM

## 2014-05-25 DIAGNOSIS — D509 Iron deficiency anemia, unspecified: Secondary | ICD-10-CM

## 2014-05-25 DIAGNOSIS — I10 Essential (primary) hypertension: Secondary | ICD-10-CM | POA: Diagnosis not present

## 2014-05-25 DIAGNOSIS — N289 Disorder of kidney and ureter, unspecified: Secondary | ICD-10-CM | POA: Insufficient documentation

## 2014-05-25 HISTORY — DX: Disorder of kidney and ureter, unspecified: N28.9

## 2014-05-25 LAB — COMPREHENSIVE METABOLIC PANEL (CC13)
ALBUMIN: 3.9 g/dL (ref 3.5–5.0)
ALK PHOS: 110 U/L (ref 40–150)
ALT: 15 U/L (ref 0–55)
AST: 21 U/L (ref 5–34)
Anion Gap: 14 mEq/L — ABNORMAL HIGH (ref 3–11)
BILIRUBIN TOTAL: 0.86 mg/dL (ref 0.20–1.20)
BUN: 22.8 mg/dL (ref 7.0–26.0)
CO2: 18 mEq/L — ABNORMAL LOW (ref 22–29)
CREATININE: 1.2 mg/dL (ref 0.7–1.3)
Calcium: 9.6 mg/dL (ref 8.4–10.4)
Chloride: 106 mEq/L (ref 98–109)
Glucose: 102 mg/dl (ref 70–140)
POTASSIUM: 5 meq/L (ref 3.5–5.1)
Sodium: 139 mEq/L (ref 136–145)
Total Protein: 7.2 g/dL (ref 6.4–8.3)

## 2014-05-25 LAB — CBC WITH DIFFERENTIAL/PLATELET
BASO%: 0.9 % (ref 0.0–2.0)
Basophils Absolute: 0.1 10*3/uL (ref 0.0–0.1)
EOS ABS: 0.4 10*3/uL (ref 0.0–0.5)
EOS%: 3.3 % (ref 0.0–7.0)
HCT: 45.5 % (ref 38.4–49.9)
HGB: 15.1 g/dL (ref 13.0–17.1)
LYMPH#: 1.3 10*3/uL (ref 0.9–3.3)
LYMPH%: 11.6 % — ABNORMAL LOW (ref 14.0–49.0)
MCH: 29.6 pg (ref 27.2–33.4)
MCHC: 33.1 g/dL (ref 32.0–36.0)
MCV: 89.5 fL (ref 79.3–98.0)
MONO#: 1.2 10*3/uL — ABNORMAL HIGH (ref 0.1–0.9)
MONO%: 10.9 % (ref 0.0–14.0)
NEUT%: 73.3 % (ref 39.0–75.0)
NEUTROS ABS: 8.4 10*3/uL — AB (ref 1.5–6.5)
Platelets: 196 10*3/uL (ref 140–400)
RBC: 5.09 10*6/uL (ref 4.20–5.82)
RDW: 15.2 % — AB (ref 11.0–14.6)
WBC: 11.4 10*3/uL — ABNORMAL HIGH (ref 4.0–10.3)

## 2014-05-25 LAB — IRON AND TIBC CHCC
%SAT: 18 % — AB (ref 20–55)
IRON: 65 ug/dL (ref 42–163)
TIBC: 355 ug/dL (ref 202–409)
UIBC: 290 ug/dL (ref 117–376)

## 2014-05-25 LAB — FERRITIN CHCC: FERRITIN: 70 ng/mL (ref 22–316)

## 2014-05-25 NOTE — Progress Notes (Signed)
[place] OFFICE PROGRESS NOTE  Lin Landsman [name]., MD [address] [place] [address]  DIAGNOSIS: Anemia - Plan: CBC with Differential, Comprehensive metabolic panel (Cmet) - [facility], Ferritin, Iron and TIBC [facility], CT Abdomen Pelvis W Contrast  Renal lesion - Plan: Ambulatory referral to Urology, CT Abdomen Pelvis W Contrast  Chief Complaint  Patient presents with  . Anemia    PAST THERAPY: Feraheme 1,020 mg IV given on 12/19/2012, 06/17/2013, 11/24/2013  CURRENT THERAPY: Watchful observation.  INTERVAL HISTORY: Lawrence [name] 68 y.o. male returns for routine followup for his anemia. Received IV iron in December 2013 and on 05/28/2013, 11/24/2013 and had a significant improvement in his fatigue, dizziness, and dyspnea on exertion. He has been more active in the gym.  He reports some intended weight lost. He denies chest pain, shortness of breath, abdominal pain, nausea,  vomiting. He no longer has ice pica. Denies epistaxis, hemoptysis, hematemesis, hematuria, melena, and hematochezia. He uses CPAP at night.  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  He also notes that he is working out three times a week.   MEDICAL HISTORY: Past Medical History  Diagnosis Date  . Chronic ischemic heart disease, unspecified   . Other testicular hypofunction   . Chronic airway obstruction, not elsewhere classified   . Renal insufficiency, mild     hx of  . PUD (peptic ulcer disease)   . Obesity   . Nontoxic multinodular goiter   . Diabetes mellitus     type II, neuropathy,   . Coronary artery disease   . Hyperlipidemia, mixed   . Essential hypertension, benign   . Anemia   . Nodule of kidney     incidentally found on CT abdomen 11/2012.  Pending Urology evaluation.     INTERIM HISTORY: has Coronary artery disease; Hyperlipidemia, mixed; Essential hypertension, benign; Anemia; COPD (chronic obstructive pulmonary disease); Morbid obesity; and Renal lesion on  his problem list.    ALLERGIES:  has No Known Allergies.  MEDICATIONS: has a current medication list which includes the following prescription(s): allopurinol, amlodipine, clopidogrel, gabapentin, glipizide, ipratropium-albuterol, ipratropium-albuterol, lisinopril, meloxicam, metformin, nebivolol, nitroglycerin, omeprazole, pravastatin, tacrolimus, and tadalafil.  SURGICAL HISTORY:  Past Surgical History  Procedure Laterality Date  . Cholecystectomy    . Shoulder surgery      left shoulder  . Coronary artery bypass graft  2007    x 4  . Tonsilectomy, adenoidectomy, bilateral myringotomy and tubes    . Esophagogastroduodenoscopy      10/06/2012:  hiatal hernia, moderate gastritis.  2012: Colonoscopy at [facility]:  one polyp.  Due next 2017    REVIEW OF SYSTEMS:   Constitutional: Denies fevers, chills or abnormal weight loss Eyes: Denies blurriness of vision Ears, nose, mouth, throat, and face: Denies mucositis or sore throat Respiratory: Denies cough, dyspnea or wheezes Cardiovascular: Denies palpitation, chest discomfort or lower extremity swelling Gastrointestinal:  Denies nausea, heartburn or change in bowel habits Skin: Denies abnormal skin rashes Lymphatics: Denies new lymphadenopathy or easy bruising Neurological:Denies numbness, tingling or new weaknesses Behavioral/Psych: Mood is stable, no new changes  All other systems were reviewed with the patient and are negative.  PHYSICAL EXAMINATION: ECOG PERFORMANCE STATUS: 0 - Asymptomatic  Blood pressure 172/78, pulse 52, temperature 97 F (36.1 C), temperature source Oral, resp. rate 18, height 5\' 10"  (1.778 m), weight 262 lb 14.4 oz (119.251 kg).  GENERAL:alert, no distress and comfortable; Morbidly obese but full ambulatory.  SKIN: skin color,  texture, turgor are normal, no rashes or significant lesions EYES: normal, Conjunctiva are pink and non-injected, sclera clear OROPHARYNX:no exudate, no erythema and lips, buccal mucosa,  and tongue normal  NECK: supple, thyroid normal size, non-tender, without nodularity LYMPH:  no palpable lymphadenopathy in the cervical, axillary or supraclavicular LUNGS: clear to auscultation and percussion with normal breathing effort HEART: regular rate & rhythm and no murmurs and no lower extremity edema ABDOMEN:abdomen soft, non-tender and normal bowel sounds Musculoskeletal:no cyanosis of digits and no clubbing  NEURO: alert & oriented x 3 with fluent speech, no focal motor/sensory deficits  LABORATORY DATA: Results for orders placed in visit on 05/25/14 (from the past 48 hour(s))  CBC WITH DIFFERENTIAL     Status: Abnormal   Collection Time    05/25/14  9:51 AM      Result Value Ref Range   WBC 11.4 (*) 4.0 - 10.3 10e3/uL   NEUT# 8.4 (*) 1.5 - 6.5 10e3/uL   HGB 15.1  13.0 - 17.1 g/dL   HCT 45.5  38.4 - 49.9 %   Platelets 196  140 - 400 10e3/uL   MCV 89.5  79.3 - 98.0 fL   MCH 29.6  27.2 - 33.4 pg   MCHC 33.1  32.0 - 36.0 g/dL   RBC 5.09  4.20 - 5.82 10e6/uL   RDW 15.2 (*) 11.0 - 14.6 %   lymph# 1.3  0.9 - 3.3 10e3/uL   MONO# 1.2 (*) 0.1 - 0.9 10e3/uL   Eosinophils Absolute 0.4  0.0 - 0.5 10e3/uL   Basophils Absolute 0.1  0.0 - 0.1 10e3/uL   NEUT% 73.3  39.0 - 75.0 %   LYMPH% 11.6 (*) 14.0 - 49.0 %   MONO% 10.9  0.0 - 14.0 %   EOS% 3.3  0.0 - 7.0 %   BASO% 0.9  0.0 - 2.0 %  FERRITIN [facility]     Status: None   Collection Time    05/25/14  9:51 AM      Result Value Ref Range   Ferritin 70  22 - 316 ng/ml  IRON AND TIBC [facility]     Status: Abnormal   Collection Time    05/25/14  9:51 AM      Result Value Ref Range   Iron 65  42 - 163 ug/dL   TIBC 355  202 - 409 ug/dL   UIBC 290  117 - 376 ug/dL   %SAT 18 (*) 20 - 55 %  COMPREHENSIVE METABOLIC PANEL (WE99)     Status: Abnormal   Collection Time    05/25/14  9:51 AM      Result Value Ref Range   Sodium 139  136 - 145 mEq/L   Potassium 5.0  3.5 - 5.1 mEq/L   Chloride 106  98 - 109 mEq/L   CO2 18 (*) 22 - 29 mEq/L    Glucose 102  70 - 140 mg/dl   BUN 22.8  7.0 - 26.0 mg/dL   Creatinine 1.2  0.7 - 1.3 mg/dL   Total Bilirubin 0.86  0.20 - 1.20 mg/dL   Alkaline Phosphatase 110  40 - 150 U/L   AST 21  5 - 34 U/L   ALT 15  0 - 55 U/L   Total Protein 7.2  6.4 - 8.3 g/dL   Albumin 3.9  3.5 - 5.0 g/dL   Calcium 9.6  8.4 - 10.4 mg/dL   Anion Gap 14 (*) 3 - 11 mEq/L    Labs:  Lab Results  Component Value Date   WBC 11.4* 05/25/2014   HGB 15.1 05/25/2014   HCT 45.5 05/25/2014   MCV 89.5 05/25/2014   PLT 196 05/25/2014   NEUTROABS 8.4* 05/25/2014      Chemistry      Component Value Date/Time   NA 139 05/25/2014 0951   NA 131* 01/29/2008 0340   K 5.0 05/25/2014 0951   K 3.7 01/29/2008 0340   CL 108* 06/09/2013 1218   CL 100 01/29/2008 0340   CO2 18* 05/25/2014 0951   CO2 25 01/29/2008 0340   BUN 22.8 05/25/2014 0951   BUN 22 01/29/2008 0340   CREATININE 1.2 05/25/2014 0951   CREATININE 1.09 01/29/2008 0340      Component Value Date/Time   CALCIUM 9.6 05/25/2014 0951   CALCIUM 8.4 01/29/2008 0340   ALKPHOS 110 05/25/2014 0951   AST 21 05/25/2014 0951   ALT 15 05/25/2014 0951   BILITOT 0.86 05/25/2014 0951     Basic Metabolic Panel:  Recent Labs Lab 05/25/14 0951  NA 139  K 5.0  CO2 18*  GLUCOSE 102  BUN 22.8  CREATININE 1.2  CALCIUM 9.6   GFR Estimated Creatinine Clearance: 76.3 ml/min (by C-G formula based on Cr of 1.2). Liver Function Tests:  Recent Labs Lab 05/25/14 0951  AST 21  ALT 15  ALKPHOS 110  BILITOT 0.86  PROT 7.2  ALBUMIN 3.9   CBC:  Recent Labs Lab 05/25/14 0951  WBC 11.4*  NEUTROABS 8.4*  HGB 15.1  HCT 45.5  MCV 89.5  PLT 196   Anemia work up  Recent Labs  05/25/14 0951  FERRITIN 70  TIBC 355  IRON 65   Microbiology No results found for this or any previous visit (from the past 240 hour(s)).  Studies:  No results found.   RADIOGRAPHIC STUDIES: No results found.  ASSESSMENT: Lawrence [name] 68 y.o. male with a history of Anemia - Plan: CBC with Differential, Comprehensive  metabolic panel (Cmet) - [facility], Ferritin, Iron and TIBC [facility], CT Abdomen Pelvis W Contrast  Renal lesion - Plan: Ambulatory referral to Urology, CT Abdomen Pelvis W Contrast   PLAN:  1. Gout: He is on allopurinol.  2. Hypertension: He is on amlodipine, Lisinopril, nebivolol.  3. Hyperlipidemia: He is on pravastatin.  4. COPD: On inhalers.  5. CAD: on aspirin, Plavix, nebivolol, lisinopril; pravastatin.  6. Microcytic anemia with iron deficiency:  - Cause: Unknown (malabsorption vs small bowel AVM?) GI work up unrevealing so far. Reportedly negative EGD and CT scan. Capsule endoscopy was negative.  - Treatment: Status post Feraheme with significant improvement in his hemoglobin. Iron studies are as noted above.  We gave him 510 mg of feraheme in the clinic six ago.  He cannot tolerate oral iron due to severe dyspepsia. We will check a CBC every three months.  7. Renal nodule: Incidental finding on CT scan. Referral to urology and repeat CT of abdomen and pelvis to ensure stability 8. Follow up: Lab every three months (locally and results faxed to me) and a return visit in about 6 months.   All questions were answered. The patient knows to call the clinic with any problems, questions or concerns. We can certainly see the patient much sooner if necessary.  I spent 15 minutes counseling the patient face to face. The total time spent in the appointment was 25 minutes.    Concha Norway, MD 05/25/2014 1:31 PM

## 2014-05-25 NOTE — Telephone Encounter (Signed)
, °

## 2014-05-27 DIAGNOSIS — N529 Male erectile dysfunction, unspecified: Secondary | ICD-10-CM | POA: Diagnosis not present

## 2014-05-27 DIAGNOSIS — N289 Disorder of kidney and ureter, unspecified: Secondary | ICD-10-CM | POA: Diagnosis not present

## 2014-05-28 ENCOUNTER — Encounter (HOSPITAL_COMMUNITY): Payer: Self-pay

## 2014-05-28 ENCOUNTER — Ambulatory Visit (HOSPITAL_COMMUNITY)
Admission: RE | Admit: 2014-05-28 | Discharge: 2014-05-28 | Disposition: A | Discharge status: Home / Self Care | Payer: Medicare Other | Source: Ambulatory Visit | Attending: Internal Medicine | Admitting: Internal Medicine

## 2014-05-28 DIAGNOSIS — D649 Anemia, unspecified: Secondary | ICD-10-CM

## 2014-05-28 DIAGNOSIS — N281 Cyst of kidney, acquired: Secondary | ICD-10-CM | POA: Diagnosis not present

## 2014-05-28 DIAGNOSIS — K4090 Unilateral inguinal hernia, without obstruction or gangrene, not specified as recurrent: Secondary | ICD-10-CM | POA: Insufficient documentation

## 2014-05-28 DIAGNOSIS — K439 Ventral hernia without obstruction or gangrene: Secondary | ICD-10-CM | POA: Diagnosis not present

## 2014-05-28 DIAGNOSIS — N289 Disorder of kidney and ureter, unspecified: Secondary | ICD-10-CM | POA: Diagnosis not present

## 2014-05-28 MED ORDER — IOHEXOL 300 MG/ML  SOLN
100.0000 mL | Freq: Once | INTRAMUSCULAR | Status: AC | PRN
  Administered 2014-05-28: 100 mL via INTRAVENOUS

## 2014-06-07 DIAGNOSIS — I10 Essential (primary) hypertension: Secondary | ICD-10-CM | POA: Diagnosis not present

## 2014-06-07 DIAGNOSIS — J029 Acute pharyngitis, unspecified: Secondary | ICD-10-CM | POA: Diagnosis not present

## 2014-06-07 DIAGNOSIS — N289 Disorder of kidney and ureter, unspecified: Secondary | ICD-10-CM | POA: Diagnosis not present

## 2014-06-07 DIAGNOSIS — K140 Glossitis: Secondary | ICD-10-CM | POA: Diagnosis not present

## 2014-07-05 DIAGNOSIS — N529 Male erectile dysfunction, unspecified: Secondary | ICD-10-CM | POA: Insufficient documentation

## 2014-07-05 DIAGNOSIS — N281 Cyst of kidney, acquired: Secondary | ICD-10-CM

## 2014-07-05 DIAGNOSIS — Q619 Cystic kidney disease, unspecified: Secondary | ICD-10-CM | POA: Diagnosis not present

## 2014-07-05 HISTORY — DX: Cyst of kidney, acquired: N28.1

## 2014-07-05 HISTORY — DX: Male erectile dysfunction, unspecified: N52.9

## 2014-09-17 DIAGNOSIS — M1990 Unspecified osteoarthritis, unspecified site: Secondary | ICD-10-CM | POA: Diagnosis not present

## 2014-09-17 DIAGNOSIS — R609 Edema, unspecified: Secondary | ICD-10-CM | POA: Diagnosis not present

## 2014-09-17 DIAGNOSIS — I10 Essential (primary) hypertension: Secondary | ICD-10-CM | POA: Diagnosis not present

## 2014-09-24 DIAGNOSIS — E1165 Type 2 diabetes mellitus with hyperglycemia: Secondary | ICD-10-CM | POA: Diagnosis not present

## 2014-09-24 DIAGNOSIS — D649 Anemia, unspecified: Secondary | ICD-10-CM | POA: Diagnosis not present

## 2014-09-24 DIAGNOSIS — M109 Gout, unspecified: Secondary | ICD-10-CM | POA: Diagnosis not present

## 2014-09-24 DIAGNOSIS — I10 Essential (primary) hypertension: Secondary | ICD-10-CM | POA: Diagnosis not present

## 2014-11-03 DIAGNOSIS — E1165 Type 2 diabetes mellitus with hyperglycemia: Secondary | ICD-10-CM | POA: Diagnosis not present

## 2014-11-03 DIAGNOSIS — I10 Essential (primary) hypertension: Secondary | ICD-10-CM | POA: Diagnosis not present

## 2014-11-03 DIAGNOSIS — M109 Gout, unspecified: Secondary | ICD-10-CM | POA: Diagnosis not present

## 2014-11-17 ENCOUNTER — Telehealth: Payer: Self-pay | Admitting: Hematology

## 2014-11-17 NOTE — Telephone Encounter (Signed)
Lm to confirm appt for 12/09/14. Mailed cal.

## 2014-11-24 ENCOUNTER — Other Ambulatory Visit: Payer: Medicare Other

## 2014-11-24 ENCOUNTER — Ambulatory Visit: Payer: Medicare Other

## 2014-11-29 DIAGNOSIS — E785 Hyperlipidemia, unspecified: Secondary | ICD-10-CM | POA: Diagnosis not present

## 2014-11-29 DIAGNOSIS — R0602 Shortness of breath: Secondary | ICD-10-CM | POA: Diagnosis not present

## 2014-11-29 DIAGNOSIS — Z951 Presence of aortocoronary bypass graft: Secondary | ICD-10-CM | POA: Diagnosis not present

## 2014-11-29 DIAGNOSIS — I739 Peripheral vascular disease, unspecified: Secondary | ICD-10-CM | POA: Diagnosis not present

## 2014-11-29 DIAGNOSIS — E119 Type 2 diabetes mellitus without complications: Secondary | ICD-10-CM | POA: Diagnosis not present

## 2014-11-29 DIAGNOSIS — I272 Other secondary pulmonary hypertension: Secondary | ICD-10-CM | POA: Diagnosis not present

## 2014-11-29 DIAGNOSIS — I10 Essential (primary) hypertension: Secondary | ICD-10-CM | POA: Diagnosis not present

## 2014-11-29 DIAGNOSIS — G4733 Obstructive sleep apnea (adult) (pediatric): Secondary | ICD-10-CM | POA: Diagnosis not present

## 2014-12-01 ENCOUNTER — Telehealth: Payer: Self-pay | Admitting: Hematology

## 2014-12-01 NOTE — Telephone Encounter (Signed)
Called and r/s appt for 12/09/14 to 12/28/14 pt confirm. Mailed new cal.

## 2014-12-09 ENCOUNTER — Other Ambulatory Visit: Payer: Medicare Other

## 2014-12-09 ENCOUNTER — Ambulatory Visit: Payer: Medicare Other | Admitting: Hematology

## 2014-12-09 DIAGNOSIS — I272 Other secondary pulmonary hypertension: Secondary | ICD-10-CM | POA: Diagnosis not present

## 2014-12-09 DIAGNOSIS — I2510 Atherosclerotic heart disease of native coronary artery without angina pectoris: Secondary | ICD-10-CM | POA: Diagnosis not present

## 2014-12-09 DIAGNOSIS — E785 Hyperlipidemia, unspecified: Secondary | ICD-10-CM | POA: Diagnosis not present

## 2014-12-09 DIAGNOSIS — Z0000 Encounter for general adult medical examination without abnormal findings: Secondary | ICD-10-CM | POA: Diagnosis not present

## 2014-12-09 DIAGNOSIS — I10 Essential (primary) hypertension: Secondary | ICD-10-CM | POA: Diagnosis not present

## 2014-12-23 DIAGNOSIS — Z0000 Encounter for general adult medical examination without abnormal findings: Secondary | ICD-10-CM | POA: Diagnosis not present

## 2014-12-23 DIAGNOSIS — M7672 Peroneal tendinitis, left leg: Secondary | ICD-10-CM | POA: Diagnosis not present

## 2014-12-23 DIAGNOSIS — M779 Enthesopathy, unspecified: Secondary | ICD-10-CM | POA: Diagnosis not present

## 2014-12-23 DIAGNOSIS — I10 Essential (primary) hypertension: Secondary | ICD-10-CM | POA: Diagnosis not present

## 2014-12-23 DIAGNOSIS — E1142 Type 2 diabetes mellitus with diabetic polyneuropathy: Secondary | ICD-10-CM | POA: Diagnosis not present

## 2014-12-23 DIAGNOSIS — E785 Hyperlipidemia, unspecified: Secondary | ICD-10-CM | POA: Diagnosis not present

## 2014-12-27 ENCOUNTER — Other Ambulatory Visit: Payer: Self-pay | Admitting: *Deleted

## 2014-12-27 DIAGNOSIS — D509 Iron deficiency anemia, unspecified: Secondary | ICD-10-CM

## 2014-12-28 ENCOUNTER — Other Ambulatory Visit (HOSPITAL_BASED_OUTPATIENT_CLINIC_OR_DEPARTMENT_OTHER): Payer: Medicare Other

## 2014-12-28 ENCOUNTER — Telehealth: Payer: Self-pay | Admitting: Hematology

## 2014-12-28 ENCOUNTER — Telehealth: Payer: Self-pay | Admitting: *Deleted

## 2014-12-28 ENCOUNTER — Encounter: Payer: Self-pay | Admitting: Hematology

## 2014-12-28 ENCOUNTER — Ambulatory Visit (HOSPITAL_BASED_OUTPATIENT_CLINIC_OR_DEPARTMENT_OTHER): Payer: Medicare Other | Admitting: Hematology

## 2014-12-28 VITALS — BP 172/77 | HR 53 | Temp 97.6°F | Resp 20 | Ht 70.0 in | Wt 270.5 lb

## 2014-12-28 DIAGNOSIS — E785 Hyperlipidemia, unspecified: Secondary | ICD-10-CM

## 2014-12-28 DIAGNOSIS — I10 Essential (primary) hypertension: Secondary | ICD-10-CM | POA: Diagnosis not present

## 2014-12-28 DIAGNOSIS — N289 Disorder of kidney and ureter, unspecified: Secondary | ICD-10-CM | POA: Diagnosis not present

## 2014-12-28 DIAGNOSIS — J449 Chronic obstructive pulmonary disease, unspecified: Secondary | ICD-10-CM | POA: Diagnosis not present

## 2014-12-28 DIAGNOSIS — D509 Iron deficiency anemia, unspecified: Secondary | ICD-10-CM

## 2014-12-28 DIAGNOSIS — M109 Gout, unspecified: Secondary | ICD-10-CM | POA: Diagnosis not present

## 2014-12-28 HISTORY — DX: Iron deficiency anemia, unspecified: D50.9

## 2014-12-28 LAB — CBC WITH DIFFERENTIAL/PLATELET
BASO%: 0.5 % (ref 0.0–2.0)
BASOS ABS: 0 10*3/uL (ref 0.0–0.1)
EOS%: 2.5 % (ref 0.0–7.0)
Eosinophils Absolute: 0.2 10*3/uL (ref 0.0–0.5)
HEMATOCRIT: 42.3 % (ref 38.4–49.9)
HGB: 13.7 g/dL (ref 13.0–17.1)
LYMPH%: 13.8 % — AB (ref 14.0–49.0)
MCH: 29.4 pg (ref 27.2–33.4)
MCHC: 32.4 g/dL (ref 32.0–36.0)
MCV: 90.8 fL (ref 79.3–98.0)
MONO#: 1 10*3/uL — AB (ref 0.1–0.9)
MONO%: 11 % (ref 0.0–14.0)
NEUT#: 6.3 10*3/uL (ref 1.5–6.5)
NEUT%: 72.2 % (ref 39.0–75.0)
PLATELETS: 208 10*3/uL (ref 140–400)
RBC: 4.66 10*6/uL (ref 4.20–5.82)
RDW: 14.7 % — ABNORMAL HIGH (ref 11.0–14.6)
WBC: 8.8 10*3/uL (ref 4.0–10.3)
lymph#: 1.2 10*3/uL (ref 0.9–3.3)

## 2014-12-28 LAB — COMPREHENSIVE METABOLIC PANEL (CC13)
ALBUMIN: 3.6 g/dL (ref 3.5–5.0)
ALK PHOS: 141 U/L (ref 40–150)
ALT: 23 U/L (ref 0–55)
AST: 22 U/L (ref 5–34)
Anion Gap: 10 mEq/L (ref 3–11)
BUN: 29.5 mg/dL — ABNORMAL HIGH (ref 7.0–26.0)
CO2: 26 meq/L (ref 22–29)
Calcium: 9.8 mg/dL (ref 8.4–10.4)
Chloride: 105 mEq/L (ref 98–109)
Creatinine: 1.4 mg/dL — ABNORMAL HIGH (ref 0.7–1.3)
EGFR: 52 mL/min/{1.73_m2} — ABNORMAL LOW (ref >90)
GLUCOSE: 180 mg/dL — AB (ref 70–140)
Potassium: 4.8 mEq/L (ref 3.5–5.1)
SODIUM: 140 meq/L (ref 136–145)
TOTAL PROTEIN: 6.9 g/dL (ref 6.4–8.3)
Total Bilirubin: 0.5 mg/dL (ref 0.20–1.20)

## 2014-12-28 LAB — IRON AND TIBC CHCC
%SAT: 15 % — ABNORMAL LOW (ref 20–55)
Iron: 52 ug/dL (ref 42–163)
TIBC: 348 ug/dL (ref 202–409)
UIBC: 296 ug/dL (ref 117–376)

## 2014-12-28 LAB — FERRITIN CHCC: Ferritin: 61 ng/ml (ref 22–316)

## 2014-12-28 NOTE — Progress Notes (Signed)
[place] OFFICE PROGRESS NOTE  [name]., MD [place] [address]  DIAGNOSIS: Anemia, iron deficiency  CHIEF COMPLAIN: Follow up  PAST THERAPY: Feraheme 1,020 mg IV given on 12/19/2012, 06/17/2013, 11/24/2013  CURRENT THERAPY: Watchful observation.  INTERVAL HISTORY: Lawrence [name] 69 y.o. male returns for routine followup for his anemia. Received IV iron in December 2013 and on 05/28/2013, 11/24/2013 and had a significant improvement in his fatigue, dizziness, and dyspnea on exertion.  tochezia.   He noticed black dark stools since 2 month ago, no diarrhea, it happened 3-4 times intermittently, lasts  2-3 days then resolved. It recurred twice 2 days ago, no dizziness, he again noticed (+) fatigue, but able to function well at home. No chest pain or dyspnea or other symptoms.  He denied any bleeding episodes including hematochezia, melana, hemoptysis, hematuria or epitaxis. No mucosal bleeding or easy bruising.    MEDICAL HISTORY: Past Medical History  Diagnosis Date  . Chronic ischemic heart disease, unspecified   . Other testicular hypofunction   . Chronic airway obstruction, not elsewhere classified   . Renal insufficiency, mild     hx of  . PUD (peptic ulcer disease)   . Obesity   . Nontoxic multinodular goiter   . Diabetes mellitus     type II, neuropathy,   . Coronary artery disease   . Hyperlipidemia, mixed   . Essential hypertension, benign   . Anemia   . Nodule of kidney     incidentally found on CT abdomen 11/2012.  Pending Urology evaluation.     INTERIM HISTORY: has Coronary artery disease; Hyperlipidemia, mixed; Essential hypertension, benign; Anemia; COPD (chronic obstructive pulmonary disease); Morbid obesity; Renal lesion; and Anemia, iron deficiency on his problem list.    ALLERGIES:  has No Known Allergies.  MEDICATIONS: has a current medication list which includes the following prescription(s): glucose blood,  ipratropium-albuterol, metformin, nebivolol, allopurinol, clopidogrel, gabapentin, glipizide, ipratropium-albuterol, nitroglycerin, omeprazole, pravastatin, and tacrolimus.  SURGICAL HISTORY:  Past Surgical History  Procedure Laterality Date  . Cholecystectomy    . Shoulder surgery      left shoulder  . Coronary artery bypass graft  2007    x 4  . Tonsilectomy, adenoidectomy, bilateral myringotomy and tubes    . Esophagogastroduodenoscopy      10/06/2012:  hiatal hernia, moderate gastritis.  2012: Colonoscopy at [facility]:  one polyp.  Due next 2017    REVIEW OF SYSTEMS:   Constitutional: Denies fevers, chills or abnormal weight loss Eyes: Denies blurriness of vision Ears, nose, mouth, throat, and face: Denies mucositis or sore throat Respiratory: Denies cough, dyspnea or wheezes Cardiovascular: Denies palpitation, chest discomfort or lower extremity swelling Gastrointestinal:  Denies nausea, heartburn or change in bowel habits Skin: Denies abnormal skin rashes Lymphatics: Denies new lymphadenopathy or easy bruising Neurological:Denies numbness, tingling or new weaknesses Behavioral/Psych: Mood is stable, no new changes  All other systems were reviewed with the patient and are negative.  PHYSICAL EXAMINATION: ECOG PERFORMANCE STATUS: 0 - Asymptomatic  Blood pressure 172/77, pulse 53, temperature 97.6 F (36.4 C), temperature source Oral, resp. rate 20, height 5' 10"  (1.778 m), weight 270 lb 8 oz (122.698 kg).  GENERAL:alert, no distress and comfortable; Morbidly obese but full ambulatory.  SKIN: skin color, texture, turgor are normal, no rashes or significant lesions EYES: normal, Conjunctiva are pink and non-injected, sclera clear OROPHARYNX:no exudate, no erythema and lips, buccal mucosa, and tongue normal  NECK: supple, thyroid normal size,  non-tender, without nodularity LYMPH:  no palpable lymphadenopathy in the cervical, axillary or supraclavicular LUNGS: clear to auscultation  and percussion with normal breathing effort HEART: regular rate & rhythm and no murmurs and no lower extremity edema ABDOMEN:abdomen soft, non-tender and normal bowel sounds Musculoskeletal:no cyanosis of digits and no clubbing  NEURO: alert & oriented x 3 with fluent speech, no focal motor/sensory deficits  LABORATORY DATA: CBC Latest Ref Rng 12/28/2014 05/25/2014 11/24/2013  WBC 4.0 - 10.3 10e3/uL 8.8 11.4(H) 9.8  Hemoglobin 13.0 - 17.1 g/dL 13.7 15.1 13.1  Hematocrit 38.4 - 49.9 % 42.3 45.5 40.0  Platelets 140 - 400 10e3/uL 208 196 194    CMP Latest Ref Rng 12/28/2014 05/25/2014 11/24/2013  Glucose 70 - 140 mg/dl 180(H) 102 397(H)  BUN 7.0 - 26.0 mg/dL 29.5(H) 22.8 23.5  Creatinine 0.7 - 1.3 mg/dL 1.4(H) 1.2 1.3  Sodium 136 - 145 mEq/L 140 139 140  Potassium 3.5 - 5.1 mEq/L 4.8 5.0 4.4  Chloride 98 - 107 mEq/L - - -  CO2 22 - 29 mEq/L 26 18(L) 23  Calcium 8.4 - 10.4 mg/dL 9.8 9.6 9.3  Total Protein 6.4 - 8.3 g/dL 6.9 7.2 6.8  Total Bilirubin 0.20 - 1.20 mg/dL 0.50 0.86 0.64  Alkaline Phos 40 - 150 U/L 141 110 148  AST 5 - 34 U/L 22 21 26   ALT 0 - 55 U/L 23 15 17      Studies:  No results found.   RADIOGRAPHIC STUDIES: No results found.  ASSESSMENT: Lawrence [name] 69 y.o. male with a history of Anemia, iron deficiency   PLAN:  1. Microcytic anemia with iron deficiency:  - Cause: Unknown (malabsorption vs small bowel AVM?) GI work up unrevealing so far. Reportedly negative EGD and CT scan. Capsule endoscopy was negative.  - Treatment: Status post Feraheme with significant improvement in his hemoglobin. -he now has intermittent black stool and H/H slightly lower than before, possible GI bleeding and iron deficiency again  -I encourage him to call Dr. Lyndel Safe for repeated endoscopy  -If ferritin low, will set up iv feraheme. He prefers IV iron, but agrees to take maintenance oral iron if needed    2. Gout: He is on allopurinol.   3.  Hypertension: He is on amlodipine,  Lisinopril, nebivolol.   4. Hyperlipidemia: He is on pravastatin.   5. COPD: On inhalers.   6. CAD: on aspirin, Plavix, nebivolol, lisinopril; pravastatin.  .  7. Renal nodule: Incidental finding on CT scan. Referral to urology and repeat CT of abdomen and pelvis to ensure stability  8. Follow up: Lab every three months (locally and results faxed to me) and a return visit in about 6 months.    Plan -I will call you tomorrow for iron study result, and set up  IV feraheme if needed  -RTC 3 monthwith outside lab (CBC, CMP, FERRITIN, iron+TIBC) -call your GI Dr. Lyndel Safe for endoscopy  All questions were answered. The patient knows to call the clinic with any problems, questions or concerns. We can certainly see the patient much sooner if necessary.  I spent 15 minutes counseling the patient face to face. The total time spent in the appointment was 25 minutes.    Truitt Merle, MD 12/28/2014 10:31 AM

## 2014-12-28 NOTE — Telephone Encounter (Signed)
Spoke with pt at home and informed pt re:  Ferritin level  61 today.  Pt does not need IV Iron as per Dr. Burr Medico.  Pt voiced understanding.

## 2014-12-28 NOTE — Telephone Encounter (Signed)
Pt confirmed MD visit per 01/05 POF, gave pt AVS..... KJ, pt was given a prescription by MD to have labs done by PCP before coming back in April...Marland KitchenMarland Kitchen

## 2014-12-30 DIAGNOSIS — E785 Hyperlipidemia, unspecified: Secondary | ICD-10-CM | POA: Diagnosis not present

## 2014-12-30 DIAGNOSIS — E119 Type 2 diabetes mellitus without complications: Secondary | ICD-10-CM | POA: Diagnosis not present

## 2014-12-30 DIAGNOSIS — I739 Peripheral vascular disease, unspecified: Secondary | ICD-10-CM | POA: Diagnosis not present

## 2014-12-30 DIAGNOSIS — I10 Essential (primary) hypertension: Secondary | ICD-10-CM | POA: Diagnosis not present

## 2014-12-30 DIAGNOSIS — Z951 Presence of aortocoronary bypass graft: Secondary | ICD-10-CM | POA: Diagnosis not present

## 2014-12-30 DIAGNOSIS — I272 Other secondary pulmonary hypertension: Secondary | ICD-10-CM | POA: Diagnosis not present

## 2014-12-30 DIAGNOSIS — G4733 Obstructive sleep apnea (adult) (pediatric): Secondary | ICD-10-CM | POA: Diagnosis not present

## 2015-01-06 DIAGNOSIS — R0602 Shortness of breath: Secondary | ICD-10-CM | POA: Diagnosis not present

## 2015-01-06 DIAGNOSIS — I272 Other secondary pulmonary hypertension: Secondary | ICD-10-CM | POA: Diagnosis not present

## 2015-01-06 DIAGNOSIS — I2510 Atherosclerotic heart disease of native coronary artery without angina pectoris: Secondary | ICD-10-CM | POA: Diagnosis not present

## 2015-01-18 DIAGNOSIS — Z79899 Other long term (current) drug therapy: Secondary | ICD-10-CM | POA: Diagnosis not present

## 2015-01-18 DIAGNOSIS — R5383 Other fatigue: Secondary | ICD-10-CM | POA: Diagnosis not present

## 2015-01-18 DIAGNOSIS — M109 Gout, unspecified: Secondary | ICD-10-CM | POA: Diagnosis not present

## 2015-01-18 DIAGNOSIS — I10 Essential (primary) hypertension: Secondary | ICD-10-CM | POA: Diagnosis not present

## 2015-02-22 DIAGNOSIS — I509 Heart failure, unspecified: Secondary | ICD-10-CM | POA: Diagnosis not present

## 2015-02-22 DIAGNOSIS — I2510 Atherosclerotic heart disease of native coronary artery without angina pectoris: Secondary | ICD-10-CM | POA: Diagnosis not present

## 2015-03-01 DIAGNOSIS — E784 Other hyperlipidemia: Secondary | ICD-10-CM | POA: Diagnosis not present

## 2015-03-01 DIAGNOSIS — I10 Essential (primary) hypertension: Secondary | ICD-10-CM | POA: Diagnosis not present

## 2015-03-28 ENCOUNTER — Other Ambulatory Visit: Payer: Self-pay | Admitting: *Deleted

## 2015-03-28 DIAGNOSIS — D509 Iron deficiency anemia, unspecified: Secondary | ICD-10-CM

## 2015-03-28 DIAGNOSIS — D649 Anemia, unspecified: Secondary | ICD-10-CM

## 2015-03-29 ENCOUNTER — Telehealth: Payer: Self-pay | Admitting: Hematology

## 2015-03-29 ENCOUNTER — Ambulatory Visit (HOSPITAL_BASED_OUTPATIENT_CLINIC_OR_DEPARTMENT_OTHER): Payer: Medicare Other | Admitting: Hematology

## 2015-03-29 ENCOUNTER — Encounter: Payer: Self-pay | Admitting: Hematology

## 2015-03-29 ENCOUNTER — Telehealth: Payer: Self-pay | Admitting: *Deleted

## 2015-03-29 ENCOUNTER — Other Ambulatory Visit (HOSPITAL_BASED_OUTPATIENT_CLINIC_OR_DEPARTMENT_OTHER): Payer: Medicare Other

## 2015-03-29 VITALS — BP 183/68 | HR 51 | Temp 97.7°F | Resp 22 | Ht 70.0 in | Wt 273.8 lb

## 2015-03-29 DIAGNOSIS — M109 Gout, unspecified: Secondary | ICD-10-CM | POA: Diagnosis not present

## 2015-03-29 DIAGNOSIS — J449 Chronic obstructive pulmonary disease, unspecified: Secondary | ICD-10-CM | POA: Diagnosis not present

## 2015-03-29 DIAGNOSIS — E785 Hyperlipidemia, unspecified: Secondary | ICD-10-CM | POA: Diagnosis not present

## 2015-03-29 DIAGNOSIS — I10 Essential (primary) hypertension: Secondary | ICD-10-CM | POA: Diagnosis not present

## 2015-03-29 DIAGNOSIS — D509 Iron deficiency anemia, unspecified: Secondary | ICD-10-CM

## 2015-03-29 DIAGNOSIS — D649 Anemia, unspecified: Secondary | ICD-10-CM

## 2015-03-29 LAB — COMPREHENSIVE METABOLIC PANEL (CC13)
ALBUMIN: 3.5 g/dL (ref 3.5–5.0)
ALK PHOS: 136 U/L (ref 40–150)
ALT: 17 U/L (ref 0–55)
ANION GAP: 11 meq/L (ref 3–11)
AST: 20 U/L (ref 5–34)
BILIRUBIN TOTAL: 0.63 mg/dL (ref 0.20–1.20)
BUN: 25.8 mg/dL (ref 7.0–26.0)
CALCIUM: 8.9 mg/dL (ref 8.4–10.4)
CO2: 24 meq/L (ref 22–29)
CREATININE: 1.2 mg/dL (ref 0.7–1.3)
Chloride: 104 mEq/L (ref 98–109)
EGFR: 60 mL/min/{1.73_m2} — AB (ref >90)
Glucose: 239 mg/dl — ABNORMAL HIGH (ref 70–140)
Potassium: 4.4 mEq/L (ref 3.5–5.1)
SODIUM: 139 meq/L (ref 136–145)
Total Protein: 6.5 g/dL (ref 6.4–8.3)

## 2015-03-29 LAB — CBC WITH DIFFERENTIAL/PLATELET
BASO%: 0.6 % (ref 0.0–2.0)
Basophils Absolute: 0.1 10*3/uL (ref 0.0–0.1)
EOS%: 3.5 % (ref 0.0–7.0)
Eosinophils Absolute: 0.3 10*3/uL (ref 0.0–0.5)
HCT: 39.4 % (ref 38.4–49.9)
HGB: 12.8 g/dL — ABNORMAL LOW (ref 13.0–17.1)
LYMPH%: 11.7 % — ABNORMAL LOW (ref 14.0–49.0)
MCH: 29.4 pg (ref 27.2–33.4)
MCHC: 32.5 g/dL (ref 32.0–36.0)
MCV: 90.4 fL (ref 79.3–98.0)
MONO#: 1 10*3/uL — ABNORMAL HIGH (ref 0.1–0.9)
MONO%: 10.9 % (ref 0.0–14.0)
NEUT#: 6.6 10*3/uL — ABNORMAL HIGH (ref 1.5–6.5)
NEUT%: 73.3 % (ref 39.0–75.0)
Platelets: 167 10*3/uL (ref 140–400)
RBC: 4.36 10*6/uL (ref 4.20–5.82)
RDW: 15.1 % — AB (ref 11.0–14.6)
WBC: 9 10*3/uL (ref 4.0–10.3)
lymph#: 1.1 10*3/uL (ref 0.9–3.3)

## 2015-03-29 LAB — IRON AND TIBC CHCC
%SAT: 13 % — ABNORMAL LOW (ref 20–55)
Iron: 50 ug/dL (ref 42–163)
TIBC: 380 ug/dL (ref 202–409)
UIBC: 330 ug/dL (ref 117–376)

## 2015-03-29 LAB — FERRITIN CHCC: Ferritin: 32 ng/ml (ref 22–316)

## 2015-03-29 NOTE — Telephone Encounter (Signed)
Gave avs & calendar for July. Sent message to schedule IV.

## 2015-03-29 NOTE — Telephone Encounter (Signed)
Per staff message and POF I have scheduled appts. Advised scheduler of appts. JMW  

## 2015-03-29 NOTE — Telephone Encounter (Signed)
Left message to confirm appointment for April

## 2015-03-29 NOTE — Progress Notes (Signed)
[facility] OFFICE PROGRESS NOTE  [name]., MD [address] [place] [address]  DIAGNOSIS: Anemia, iron deficiency  CHIEF COMPLAIN: Follow up  PAST THERAPY: Feraheme 1,020 mg IV given on 12/19/2012, 06/17/2013, 11/24/2013  CURRENT THERAPY: Watchful observation, IV iron as needed  INTERVAL HISTORY: Lawrence [name] 69 y.o. male returns for routine followup for his anemia.  He has been feeling more fatigued lately, able tolerate light daily activities but not much else, spends much time sitting during the day due to the fatigue, no chest pain or dyspnea or other complains. He has been compliant with iron pill twice daily, no significant constipation or other issues.  MEDICAL HISTORY: Past Medical History  Diagnosis Date  . Chronic ischemic heart disease, unspecified   . Other testicular hypofunction   . Chronic airway obstruction, not elsewhere classified   . Renal insufficiency, mild     hx of  . PUD (peptic ulcer disease)   . Obesity   . Nontoxic multinodular goiter   . Diabetes mellitus     type II, neuropathy,   . Coronary artery disease   . Hyperlipidemia, mixed   . Essential hypertension, benign   . Anemia   . Nodule of kidney     incidentally found on CT abdomen 11/2012.  Pending Urology evaluation.     INTERIM HISTORY: has Coronary artery disease; Hyperlipidemia, mixed; Essential hypertension, benign; Anemia; COPD (chronic obstructive pulmonary disease); Morbid obesity; Renal lesion; and Anemia, iron deficiency on his problem list.    ALLERGIES:  has No Known Allergies.  MEDICATIONS: has a current medication list which includes the following prescription(s): allopurinol, clopidogrel, furosemide, gabapentin, glipizide, glucose blood, ipratropium-albuterol, ipratropium-albuterol, metformin, nebivolol, tacrolimus, and nitroglycerin.  SURGICAL HISTORY:  Past Surgical History  Procedure Laterality Date  . Cholecystectomy    . Shoulder  surgery      left shoulder  . Coronary artery bypass graft  2007    x 4  . Tonsilectomy, adenoidectomy, bilateral myringotomy and tubes    . Esophagogastroduodenoscopy      10/06/2012:  hiatal hernia, moderate gastritis.  2012: Colonoscopy at [facility]:  one polyp.  Due next 2017    REVIEW OF SYSTEMS:   Constitutional: Denies fevers, chills or abnormal weight loss Eyes: Denies blurriness of vision Ears, nose, mouth, throat, and face: Denies mucositis or sore throat Respiratory: Denies cough, dyspnea or wheezes Cardiovascular: Denies palpitation, chest discomfort or lower extremity swelling Gastrointestinal:  Denies nausea, heartburn or change in bowel habits Skin: Denies abnormal skin rashes Lymphatics: Denies new lymphadenopathy or easy bruising Neurological:Denies numbness, tingling or new weaknesses Behavioral/Psych: Mood is stable, no new changes  All other systems were reviewed with the patient and are negative.  PHYSICAL EXAMINATION: ECOG PERFORMANCE STATUS: 0 - Asymptomatic  Blood pressure 183/68, pulse 51, temperature 97.7 F (36.5 C), temperature source Oral, resp. rate 22, height 5' 10"  (1.778 m), weight 273 lb 12.8 oz (124.195 kg), SpO2 97 %.  GENERAL:alert, no distress and comfortable; Morbidly obese but full ambulatory.  SKIN: skin color, texture, turgor are normal, no rashes or significant lesions EYES: normal, Conjunctiva are pink and non-injected, sclera clear OROPHARYNX:no exudate, no erythema and lips, buccal mucosa, and tongue normal  NECK: supple, thyroid normal size, non-tender, without nodularity LYMPH:  no palpable lymphadenopathy in the cervical, axillary or supraclavicular LUNGS: clear to auscultation and percussion with normal breathing effort HEART: regular rate & rhythm and no murmurs and no lower extremity edema ABDOMEN:abdomen soft, non-tender and  normal bowel sounds Musculoskeletal:no cyanosis of digits and no clubbing  NEURO: alert & oriented x 3 with  fluent speech, no focal motor/sensory deficits  LABORATORY DATA: CBC Latest Ref Rng 03/29/2015 12/28/2014 05/25/2014  WBC 4.0 - 10.3 10e3/uL 9.0 8.8 11.4(H)  Hemoglobin 13.0 - 17.1 g/dL 12.8(L) 13.7 15.1  Hematocrit 38.4 - 49.9 % 39.4 42.3 45.5  Platelets 140 - 400 10e3/uL 167 208 196    CMP Latest Ref Rng 12/28/2014 05/25/2014 11/24/2013  Glucose 70 - 140 mg/dl 180(H) 102 397(H)  BUN 7.0 - 26.0 mg/dL 29.5(H) 22.8 23.5  Creatinine 0.7 - 1.3 mg/dL 1.4(H) 1.2 1.3  Sodium 136 - 145 mEq/L 140 139 140  Potassium 3.5 - 5.1 mEq/L 4.8 5.0 4.4  Chloride 98 - 107 mEq/L - - -  CO2 22 - 29 mEq/L 26 18(L) 23  Calcium 8.4 - 10.4 mg/dL 9.8 9.6 9.3  Total Protein 6.4 - 8.3 g/dL 6.9 7.2 6.8  Total Bilirubin 0.20 - 1.20 mg/dL 0.50 0.86 0.64  Alkaline Phos 40 - 150 U/L 141 110 148  AST 5 - 34 U/L 22 21 26   ALT 0 - 55 U/L 23 15 17      Studies:  No results found.   RADIOGRAPHIC STUDIES: No results found.  ASSESSMENT: TRACIE LINDBLOOM 69 y.o. male with a history of Anemia, iron deficiency   PLAN:  1. Microcytic anemia with iron deficiency:  - Cause: Unknown (malabsorption vs small bowel AVM?) GI work up unrevealing so far. Reportedly negative EGD and CT scan. Capsule endoscopy was negative.  - Treatment: Status post Feraheme with significant improvement in his hemoglobin. -His hemoglobin is slightly lower than before, 12.8 today. He is clinically more fatigue, ferritin was 61 3 months ago, and iron saturation was low at 15, today's iron and ferritin level are still pending. -I'll set up IV Feraheme 510 mg infusion twice in the next few weeks.   2. Gout: He is on allopurinol.   3.  Hypertension: He is on amlodipine, Lisinopril, nebivolol. His blood pressure is pretty high today, he did not take his medication today. He will take his medication as soon as he gets home.  4. Hyperlipidemia: He is on pravastatin.   5. COPD: On inhalers.   6. CAD: on aspirin, Plavix, nebivolol, lisinopril; pravastatin.   .   Plan -IV Feraheme twice in the next few weeks -Return to clinic in 3 months with lab.  All questions were answered. The patient knows to call the clinic with any problems, questions or concerns. We can certainly see the patient much sooner if necessary.  I spent 20 minutes counseling the patient face to face. The total time spent in the appointment was 25 minutes.    Truitt Merle, MD 03/29/2015 10:06 AM

## 2015-03-31 ENCOUNTER — Other Ambulatory Visit: Payer: Self-pay | Admitting: Hematology

## 2015-04-01 ENCOUNTER — Ambulatory Visit (HOSPITAL_BASED_OUTPATIENT_CLINIC_OR_DEPARTMENT_OTHER): Payer: Medicare Other

## 2015-04-01 DIAGNOSIS — D509 Iron deficiency anemia, unspecified: Secondary | ICD-10-CM

## 2015-04-01 DIAGNOSIS — D649 Anemia, unspecified: Secondary | ICD-10-CM

## 2015-04-01 MED ORDER — FERUMOXYTOL INJECTION 510 MG/17 ML: 510.0000 mg | Freq: Once | INTRAVENOUS | Status: DC

## 2015-04-01 MED ORDER — SODIUM CHLORIDE 0.9 % IV SOLN
510.0000 mg | Freq: Once | INTRAVENOUS | Status: AC
  Administered 2015-04-01: 510 mg via INTRAVENOUS
  Filled 2015-04-01: qty 17

## 2015-04-01 MED ORDER — SODIUM CHLORIDE 0.9 % IV SOLN
Freq: Once | INTRAVENOUS | Status: AC
  Administered 2015-04-01: 13:00:00 via INTRAVENOUS

## 2015-04-01 NOTE — Patient Instructions (Signed)

## 2015-04-05 ENCOUNTER — Other Ambulatory Visit: Payer: Self-pay | Admitting: Hematology

## 2015-04-08 ENCOUNTER — Ambulatory Visit (HOSPITAL_BASED_OUTPATIENT_CLINIC_OR_DEPARTMENT_OTHER): Payer: Medicare Other

## 2015-04-08 VITALS — BP 150/57 | HR 54 | Temp 99.3°F | Resp 20

## 2015-04-08 DIAGNOSIS — D649 Anemia, unspecified: Secondary | ICD-10-CM

## 2015-04-08 DIAGNOSIS — D509 Iron deficiency anemia, unspecified: Secondary | ICD-10-CM | POA: Diagnosis present

## 2015-04-08 MED ORDER — FERUMOXYTOL INJECTION 510 MG/17 ML: 510.0000 mg | Freq: Once | INTRAVENOUS | Status: DC

## 2015-04-08 MED ORDER — SODIUM CHLORIDE 0.9 % IV SOLN
510.0000 mg | Freq: Once | INTRAVENOUS | Status: AC
  Administered 2015-04-08: 510 mg via INTRAVENOUS
  Filled 2015-04-08: qty 17

## 2015-04-08 MED ORDER — SODIUM CHLORIDE 0.9 % IV SOLN
Freq: Once | INTRAVENOUS | Status: AC
  Administered 2015-04-08: 11:00:00 via INTRAVENOUS

## 2015-04-08 NOTE — Progress Notes (Signed)
Pt completed 30 minute observation period without incident.  Discharged to home ambulatory with wife.

## 2015-04-08 NOTE — Patient Instructions (Signed)

## 2015-04-19 DIAGNOSIS — D500 Iron deficiency anemia secondary to blood loss (chronic): Secondary | ICD-10-CM | POA: Diagnosis not present

## 2015-04-19 DIAGNOSIS — Z86010 Personal history of colonic polyps: Secondary | ICD-10-CM | POA: Diagnosis not present

## 2015-05-11 DIAGNOSIS — E669 Obesity, unspecified: Secondary | ICD-10-CM | POA: Diagnosis not present

## 2015-05-11 DIAGNOSIS — J449 Chronic obstructive pulmonary disease, unspecified: Secondary | ICD-10-CM | POA: Diagnosis not present

## 2015-05-11 DIAGNOSIS — E119 Type 2 diabetes mellitus without complications: Secondary | ICD-10-CM | POA: Diagnosis not present

## 2015-05-11 DIAGNOSIS — D500 Iron deficiency anemia secondary to blood loss (chronic): Secondary | ICD-10-CM | POA: Diagnosis not present

## 2015-05-11 DIAGNOSIS — Z6835 Body mass index (BMI) 35.0-35.9, adult: Secondary | ICD-10-CM | POA: Diagnosis not present

## 2015-05-11 DIAGNOSIS — Z87891 Personal history of nicotine dependence: Secondary | ICD-10-CM | POA: Diagnosis not present

## 2015-05-11 DIAGNOSIS — Z951 Presence of aortocoronary bypass graft: Secondary | ICD-10-CM | POA: Diagnosis not present

## 2015-05-11 DIAGNOSIS — K219 Gastro-esophageal reflux disease without esophagitis: Secondary | ICD-10-CM | POA: Diagnosis not present

## 2015-05-11 DIAGNOSIS — Z7902 Long term (current) use of antithrombotics/antiplatelets: Secondary | ICD-10-CM | POA: Diagnosis not present

## 2015-05-11 DIAGNOSIS — K648 Other hemorrhoids: Secondary | ICD-10-CM | POA: Diagnosis not present

## 2015-05-11 DIAGNOSIS — E785 Hyperlipidemia, unspecified: Secondary | ICD-10-CM | POA: Diagnosis not present

## 2015-05-11 DIAGNOSIS — Z9049 Acquired absence of other specified parts of digestive tract: Secondary | ICD-10-CM | POA: Diagnosis not present

## 2015-05-11 DIAGNOSIS — K5730 Diverticulosis of large intestine without perforation or abscess without bleeding: Secondary | ICD-10-CM | POA: Diagnosis not present

## 2015-05-11 DIAGNOSIS — Z86010 Personal history of colonic polyps: Secondary | ICD-10-CM | POA: Diagnosis not present

## 2015-05-11 DIAGNOSIS — I2510 Atherosclerotic heart disease of native coronary artery without angina pectoris: Secondary | ICD-10-CM | POA: Diagnosis not present

## 2015-05-11 DIAGNOSIS — I10 Essential (primary) hypertension: Secondary | ICD-10-CM | POA: Diagnosis not present

## 2015-05-11 DIAGNOSIS — I252 Old myocardial infarction: Secondary | ICD-10-CM | POA: Diagnosis not present

## 2015-05-11 DIAGNOSIS — D125 Benign neoplasm of sigmoid colon: Secondary | ICD-10-CM | POA: Diagnosis not present

## 2015-05-11 HISTORY — PX: COLONOSCOPY: SHX174

## 2015-05-25 DIAGNOSIS — D638 Anemia in other chronic diseases classified elsewhere: Secondary | ICD-10-CM | POA: Diagnosis not present

## 2015-05-25 DIAGNOSIS — M79673 Pain in unspecified foot: Secondary | ICD-10-CM | POA: Diagnosis not present

## 2015-06-07 ENCOUNTER — Telehealth: Payer: Self-pay | Admitting: Hematology

## 2015-06-07 NOTE — Telephone Encounter (Signed)
Faxed pt medical records to [facility] [phone]

## 2015-06-30 DIAGNOSIS — N189 Chronic kidney disease, unspecified: Secondary | ICD-10-CM | POA: Diagnosis not present

## 2015-06-30 DIAGNOSIS — I2510 Atherosclerotic heart disease of native coronary artery without angina pectoris: Secondary | ICD-10-CM | POA: Diagnosis not present

## 2015-06-30 DIAGNOSIS — I129 Hypertensive chronic kidney disease with stage 1 through stage 4 chronic kidney disease, or unspecified chronic kidney disease: Secondary | ICD-10-CM | POA: Diagnosis not present

## 2015-06-30 DIAGNOSIS — Z79899 Other long term (current) drug therapy: Secondary | ICD-10-CM | POA: Diagnosis not present

## 2015-06-30 DIAGNOSIS — E785 Hyperlipidemia, unspecified: Secondary | ICD-10-CM | POA: Diagnosis not present

## 2015-06-30 DIAGNOSIS — J449 Chronic obstructive pulmonary disease, unspecified: Secondary | ICD-10-CM | POA: Diagnosis not present

## 2015-06-30 DIAGNOSIS — E119 Type 2 diabetes mellitus without complications: Secondary | ICD-10-CM | POA: Diagnosis not present

## 2015-06-30 DIAGNOSIS — D500 Iron deficiency anemia secondary to blood loss (chronic): Secondary | ICD-10-CM | POA: Diagnosis not present

## 2015-06-30 DIAGNOSIS — Z7902 Long term (current) use of antithrombotics/antiplatelets: Secondary | ICD-10-CM | POA: Diagnosis not present

## 2015-07-05 ENCOUNTER — Telehealth: Payer: Self-pay | Admitting: *Deleted

## 2015-07-05 ENCOUNTER — Other Ambulatory Visit: Payer: TRICARE For Life (TFL)

## 2015-07-05 ENCOUNTER — Ambulatory Visit: Payer: TRICARE For Life (TFL) | Admitting: Hematology

## 2015-07-05 ENCOUNTER — Other Ambulatory Visit: Payer: Self-pay | Admitting: *Deleted

## 2015-07-05 NOTE — Telephone Encounter (Signed)
Pt was NO SHOW for appts today.   Called pt on home phone unsuccessfully -  Cell phone not accepting calls.  Left message on voice mail requesting a call back to nurse for rescheduled appts.

## 2015-07-06 ENCOUNTER — Telehealth: Payer: Self-pay | Admitting: Hematology

## 2015-07-06 NOTE — Telephone Encounter (Signed)
lvm for pt regarding to July appt.

## 2015-07-06 NOTE — Telephone Encounter (Signed)
returned call and s.w. pt wife and she wants to cx all appts...done.Marland KitchenMarland Kitchenpt will no longer be comming here he will be going to [place].

## 2015-07-11 ENCOUNTER — Other Ambulatory Visit: Payer: TRICARE For Life (TFL)

## 2015-07-11 ENCOUNTER — Ambulatory Visit: Payer: TRICARE For Life (TFL) | Admitting: Hematology

## 2015-07-20 DIAGNOSIS — I10 Essential (primary) hypertension: Secondary | ICD-10-CM | POA: Diagnosis not present

## 2015-07-20 DIAGNOSIS — Q6100 Congenital renal cyst, unspecified: Secondary | ICD-10-CM | POA: Diagnosis not present

## 2015-07-20 DIAGNOSIS — N281 Cyst of kidney, acquired: Secondary | ICD-10-CM | POA: Diagnosis not present

## 2015-07-20 DIAGNOSIS — N5201 Erectile dysfunction due to arterial insufficiency: Secondary | ICD-10-CM | POA: Insufficient documentation

## 2015-07-20 DIAGNOSIS — E119 Type 2 diabetes mellitus without complications: Secondary | ICD-10-CM | POA: Diagnosis not present

## 2015-07-20 DIAGNOSIS — N486 Induration penis plastica: Secondary | ICD-10-CM | POA: Insufficient documentation

## 2015-07-20 HISTORY — DX: Erectile dysfunction due to arterial insufficiency: N52.01

## 2015-07-20 HISTORY — DX: Induration penis plastica: N48.6

## 2015-07-21 DIAGNOSIS — E1165 Type 2 diabetes mellitus with hyperglycemia: Secondary | ICD-10-CM | POA: Diagnosis not present

## 2015-07-21 DIAGNOSIS — M109 Gout, unspecified: Secondary | ICD-10-CM | POA: Diagnosis not present

## 2015-07-21 DIAGNOSIS — I10 Essential (primary) hypertension: Secondary | ICD-10-CM | POA: Diagnosis not present

## 2015-07-21 DIAGNOSIS — G4730 Sleep apnea, unspecified: Secondary | ICD-10-CM | POA: Diagnosis not present

## 2015-07-27 DIAGNOSIS — I6523 Occlusion and stenosis of bilateral carotid arteries: Secondary | ICD-10-CM | POA: Diagnosis not present

## 2015-07-27 DIAGNOSIS — R0989 Other specified symptoms and signs involving the circulatory and respiratory systems: Secondary | ICD-10-CM | POA: Diagnosis not present

## 2015-08-30 DIAGNOSIS — D225 Melanocytic nevi of trunk: Secondary | ICD-10-CM | POA: Diagnosis not present

## 2015-08-30 DIAGNOSIS — L821 Other seborrheic keratosis: Secondary | ICD-10-CM | POA: Diagnosis not present

## 2015-09-14 DIAGNOSIS — Z9989 Dependence on other enabling machines and devices: Secondary | ICD-10-CM | POA: Diagnosis not present

## 2015-09-14 DIAGNOSIS — G4733 Obstructive sleep apnea (adult) (pediatric): Secondary | ICD-10-CM | POA: Diagnosis not present

## 2015-09-30 DIAGNOSIS — D500 Iron deficiency anemia secondary to blood loss (chronic): Secondary | ICD-10-CM | POA: Diagnosis not present

## 2015-09-30 DIAGNOSIS — R0602 Shortness of breath: Secondary | ICD-10-CM | POA: Diagnosis not present

## 2015-09-30 DIAGNOSIS — Q2730 Arteriovenous malformation, site unspecified: Secondary | ICD-10-CM | POA: Diagnosis not present

## 2015-10-20 DIAGNOSIS — D509 Iron deficiency anemia, unspecified: Secondary | ICD-10-CM | POA: Diagnosis not present

## 2015-10-27 DIAGNOSIS — D500 Iron deficiency anemia secondary to blood loss (chronic): Secondary | ICD-10-CM | POA: Diagnosis not present

## 2015-11-21 DIAGNOSIS — G4733 Obstructive sleep apnea (adult) (pediatric): Secondary | ICD-10-CM | POA: Diagnosis not present

## 2015-11-21 DIAGNOSIS — Z9989 Dependence on other enabling machines and devices: Secondary | ICD-10-CM | POA: Diagnosis not present

## 2015-12-27 DIAGNOSIS — E1165 Type 2 diabetes mellitus with hyperglycemia: Secondary | ICD-10-CM | POA: Diagnosis not present

## 2015-12-27 DIAGNOSIS — M109 Gout, unspecified: Secondary | ICD-10-CM | POA: Diagnosis not present

## 2016-01-03 DIAGNOSIS — Z0000 Encounter for general adult medical examination without abnormal findings: Secondary | ICD-10-CM | POA: Diagnosis not present

## 2016-01-03 DIAGNOSIS — Z1389 Encounter for screening for other disorder: Secondary | ICD-10-CM | POA: Diagnosis not present

## 2016-01-03 DIAGNOSIS — M109 Gout, unspecified: Secondary | ICD-10-CM | POA: Diagnosis not present

## 2016-01-03 DIAGNOSIS — Z2821 Immunization not carried out because of patient refusal: Secondary | ICD-10-CM | POA: Diagnosis not present

## 2016-01-03 DIAGNOSIS — E1165 Type 2 diabetes mellitus with hyperglycemia: Secondary | ICD-10-CM | POA: Diagnosis not present

## 2016-01-09 DIAGNOSIS — I272 Other secondary pulmonary hypertension: Secondary | ICD-10-CM | POA: Diagnosis not present

## 2016-01-09 DIAGNOSIS — E785 Hyperlipidemia, unspecified: Secondary | ICD-10-CM | POA: Diagnosis not present

## 2016-01-09 DIAGNOSIS — Z951 Presence of aortocoronary bypass graft: Secondary | ICD-10-CM | POA: Insufficient documentation

## 2016-01-09 DIAGNOSIS — I2510 Atherosclerotic heart disease of native coronary artery without angina pectoris: Secondary | ICD-10-CM | POA: Insufficient documentation

## 2016-01-09 DIAGNOSIS — R5383 Other fatigue: Secondary | ICD-10-CM

## 2016-01-09 DIAGNOSIS — D500 Iron deficiency anemia secondary to blood loss (chronic): Secondary | ICD-10-CM | POA: Diagnosis not present

## 2016-01-09 DIAGNOSIS — G4733 Obstructive sleep apnea (adult) (pediatric): Secondary | ICD-10-CM | POA: Insufficient documentation

## 2016-01-09 DIAGNOSIS — I5042 Chronic combined systolic (congestive) and diastolic (congestive) heart failure: Secondary | ICD-10-CM | POA: Diagnosis not present

## 2016-01-09 DIAGNOSIS — R0609 Other forms of dyspnea: Secondary | ICD-10-CM

## 2016-01-09 DIAGNOSIS — I739 Peripheral vascular disease, unspecified: Secondary | ICD-10-CM

## 2016-01-09 DIAGNOSIS — E611 Iron deficiency: Secondary | ICD-10-CM

## 2016-01-09 DIAGNOSIS — R0600 Dyspnea, unspecified: Secondary | ICD-10-CM | POA: Diagnosis not present

## 2016-01-09 HISTORY — DX: Pulmonary hypertension, unspecified: I27.20

## 2016-01-09 HISTORY — DX: Atherosclerotic heart disease of native coronary artery without angina pectoris: I25.10

## 2016-01-09 HISTORY — DX: Obstructive sleep apnea (adult) (pediatric): G47.33

## 2016-01-09 HISTORY — DX: Peripheral vascular disease, unspecified: I73.9

## 2016-01-09 HISTORY — DX: Hyperlipidemia, unspecified: E78.5

## 2016-01-09 HISTORY — DX: Presence of aortocoronary bypass graft: Z95.1

## 2016-01-10 DIAGNOSIS — I272 Other secondary pulmonary hypertension: Secondary | ICD-10-CM | POA: Diagnosis not present

## 2016-01-17 DIAGNOSIS — I2510 Atherosclerotic heart disease of native coronary artery without angina pectoris: Secondary | ICD-10-CM | POA: Diagnosis not present

## 2016-01-26 DIAGNOSIS — M109 Gout, unspecified: Secondary | ICD-10-CM | POA: Diagnosis not present

## 2016-02-01 DIAGNOSIS — E1165 Type 2 diabetes mellitus with hyperglycemia: Secondary | ICD-10-CM | POA: Diagnosis not present

## 2016-02-01 DIAGNOSIS — I10 Essential (primary) hypertension: Secondary | ICD-10-CM | POA: Diagnosis not present

## 2016-03-13 DIAGNOSIS — I2510 Atherosclerotic heart disease of native coronary artery without angina pectoris: Secondary | ICD-10-CM | POA: Diagnosis not present

## 2016-03-13 DIAGNOSIS — I739 Peripheral vascular disease, unspecified: Secondary | ICD-10-CM | POA: Diagnosis not present

## 2016-03-13 DIAGNOSIS — I272 Other secondary pulmonary hypertension: Secondary | ICD-10-CM | POA: Diagnosis not present

## 2016-03-13 DIAGNOSIS — G4733 Obstructive sleep apnea (adult) (pediatric): Secondary | ICD-10-CM | POA: Diagnosis not present

## 2016-03-13 DIAGNOSIS — E785 Hyperlipidemia, unspecified: Secondary | ICD-10-CM | POA: Diagnosis not present

## 2016-03-13 DIAGNOSIS — Z951 Presence of aortocoronary bypass graft: Secondary | ICD-10-CM | POA: Diagnosis not present

## 2016-03-23 DIAGNOSIS — E1165 Type 2 diabetes mellitus with hyperglycemia: Secondary | ICD-10-CM | POA: Diagnosis not present

## 2016-03-26 DIAGNOSIS — I2510 Atherosclerotic heart disease of native coronary artery without angina pectoris: Secondary | ICD-10-CM | POA: Diagnosis not present

## 2016-03-30 DIAGNOSIS — G629 Polyneuropathy, unspecified: Secondary | ICD-10-CM | POA: Diagnosis not present

## 2016-03-30 DIAGNOSIS — I951 Orthostatic hypotension: Secondary | ICD-10-CM | POA: Diagnosis not present

## 2016-03-30 DIAGNOSIS — M109 Gout, unspecified: Secondary | ICD-10-CM | POA: Diagnosis not present

## 2016-03-30 DIAGNOSIS — E1165 Type 2 diabetes mellitus with hyperglycemia: Secondary | ICD-10-CM | POA: Diagnosis not present

## 2016-04-09 DIAGNOSIS — D500 Iron deficiency anemia secondary to blood loss (chronic): Secondary | ICD-10-CM | POA: Diagnosis not present

## 2016-04-09 DIAGNOSIS — I509 Heart failure, unspecified: Secondary | ICD-10-CM | POA: Diagnosis not present

## 2016-04-26 ENCOUNTER — Other Ambulatory Visit: Payer: Self-pay | Admitting: *Deleted

## 2016-04-26 ENCOUNTER — Ambulatory Visit (INDEPENDENT_AMBULATORY_CARE_PROVIDER_SITE_OTHER): Payer: Medicare Other | Admitting: Neurology

## 2016-04-26 ENCOUNTER — Encounter: Payer: Self-pay | Admitting: Neurology

## 2016-04-26 VITALS — BP 148/70 | HR 76 | Ht 70.0 in | Wt 248.1 lb

## 2016-04-26 DIAGNOSIS — G609 Hereditary and idiopathic neuropathy, unspecified: Secondary | ICD-10-CM | POA: Diagnosis not present

## 2016-04-26 DIAGNOSIS — E1142 Type 2 diabetes mellitus with diabetic polyneuropathy: Secondary | ICD-10-CM

## 2016-04-26 NOTE — Progress Notes (Signed)
Patient here for EMG.  See procedure note.

## 2016-04-26 NOTE — Procedures (Signed)
[facility]  [address]  [place], [place] [address] Tel: [phone] Fax:  [phone] Test Date:  04/26/2016  Patient: Lawrence [name] DOB: 10/31/1946 Physician: Narda Amber, DO  Sex: Male Height: 6' " Ref Phys: Neta Ehlers  ID#: [id number] Temp: 35.9C Technician:    Patient Complaints: This is a 70 year-old gentleman referred for evaluation of generalized pain and paresthesias of the hands and feet.  NCV & EMG Findings: Extensive electrodiagnostic testing of the right upper and lower extremities shows: 1. Right median and radial sensory responses show reduced amplitude (R7.4, R5.9 V).  Right ulnar sensory responses within normal limits. 2. Right sural and superficial peroneal sensory responses are absent. 3. Right median and ulnar motor responses are within normal limits. 4. Right peroneal motor response is absent at the extensor digitorum brevis.  Peroneal motor response recording at the tibialis anterior is within normal limits. Right tibial motor response also shows markedly reduced amplitude. 5. Right tibial H reflex is absent. 6. In the upper extremity, chronic motor axon loss changes are seen isolated to the abductor pollicis brevis. 7. In the lower extremity, chronic motor axon loss changes are seen affecting the muscles below the knee, without accompanied active denervation.  Impression: 1. The electrophysiologic findings are most consistent with a chronic and generalized sensorimotor polyneuropathy, axon loss in type, affecting the upper and lower extremities.   2. There is no evidence of a cervical and lumbosacral radiculopathy affecting the right side.   ___________________________ Narda Amber, DO    Nerve Conduction Studies Anti Sensory Summary Table   Site NR Peak (ms) Norm Peak (ms) P-T Amp (V) Norm P-T Amp  Right Median Anti Sensory (2nd Digit)  Wrist    3.8 <3.8 7.4 >10  Right Radial Anti Sensory (Base 1st Digit)  Wrist    2.6  <2.8 5.9 >10  Right Sup Peroneal Anti Sensory (Ant Lat Mall)  12 cm NR  <4.6  >3  Right Sural Anti Sensory (Lat Mall)  Calf NR  <4.6  >3  Right Ulnar Anti Sensory (5th Digit)  Wrist    3.0 <3.2 6.6 >5   Motor Summary Table   Site NR Onset (ms) Norm Onset (ms) O-P Amp (mV) Norm O-P Amp Site1 Site2 Delta-0 (ms) Dist (cm) Vel (m/s) Norm Vel (m/s)  Right Median Motor (Abd Poll Brev)  Wrist    3.4 <4.0 9.8 >5 Elbow Wrist 5.6 31.0 55 >50  Elbow    9.0  9.2         Right Peroneal Motor (Ext Dig Brev)  Ankle NR  <6.0  >2.5 B Fib Ankle  0.0  >40  B Fib NR     Poplt B Fib  0.0  >40  Poplt NR            Right Peroneal TA Motor (Tib Ant)  Fib Head    3.1 <4.5 5.2 >3 Poplit Fib Head 1.6 9.0 56 >40  Poplit    4.7  5.2         Right Tibial Motor (Abd Hall Brev)  Ankle    4.0 <6.0 1.1 >4 Knee Ankle 10.2 42.0 41 >40  Knee    14.2  1.1         Right Ulnar Motor (Abd Dig Minimi)  Wrist    2.4 <3.1 8.3 >7 B Elbow Wrist 4.5 26.0 58 >50  B Elbow    6.9  7.5  A Elbow B Elbow 1.8  10.0 56 >50  A Elbow    8.7  7.3          H Reflex Studies   NR H-Lat (ms) Lat Norm (ms) L-R H-Lat (ms)  Right Tibial (Gastroc)  NR  <35    EMG   Side Muscle Ins Act Fibs Psw Fasc Number Recrt Dur Dur. Amp Amp. Poly Poly. Comment  Right AntTibialis Nml Nml Nml Nml 1- Rapid Some 1+ Some 1+ Nml Nml N/A  Right Gastroc Nml Nml Nml Nml 1- Rapid Some 1+ Some 1+ Nml Nml N/A  Right Flex Dig Long Nml Nml Nml Nml 2- Rapid Some 1+ Some 1+ Nml Nml N/A  Right RectFemoris Nml Nml Nml Nml Nml Nml Nml Nml Nml Nml Nml Nml N/A  Right BicepsFemS Nml Nml Nml Nml Nml Nml Nml Nml Nml Nml Nml Nml N/A  Right GluteusMed Nml Nml Nml Nml Nml Nml Nml Nml Nml Nml Nml Nml N/A  Right 1stDorInt Nml Nml Nml Nml Nml Nml Nml Nml Nml Nml Nml Nml N/A  Right Abd Poll Brev Nml Nml Nml Nml 1- Rapid Few 1+ Few 1+ Nml Nml N/A  Right Ext Indicis Nml Nml Nml Nml Nml Nml Nml Nml Nml Nml Nml Nml N/A  Right PronatorTeres Nml Nml Nml Nml Nml Nml Nml Nml Nml Nml Nml  Nml N/A  Right Biceps Nml Nml Nml Nml Nml Nml Nml Nml Nml Nml Nml Nml N/A  Right Triceps Nml Nml Nml Nml Nml Nml Nml Nml Nml Nml Nml Nml N/A  Right Deltoid Nml Nml Nml Nml Nml Nml Nml Nml Nml Nml Nml Nml N/A      Waveforms:

## 2016-04-30 DIAGNOSIS — H9202 Otalgia, left ear: Secondary | ICD-10-CM | POA: Diagnosis not present

## 2016-04-30 DIAGNOSIS — G629 Polyneuropathy, unspecified: Secondary | ICD-10-CM | POA: Diagnosis not present

## 2016-06-19 DIAGNOSIS — G4733 Obstructive sleep apnea (adult) (pediatric): Secondary | ICD-10-CM | POA: Diagnosis not present

## 2016-06-19 DIAGNOSIS — E785 Hyperlipidemia, unspecified: Secondary | ICD-10-CM | POA: Diagnosis not present

## 2016-06-19 DIAGNOSIS — Z951 Presence of aortocoronary bypass graft: Secondary | ICD-10-CM | POA: Diagnosis not present

## 2016-06-19 DIAGNOSIS — I739 Peripheral vascular disease, unspecified: Secondary | ICD-10-CM | POA: Diagnosis not present

## 2016-06-19 DIAGNOSIS — I272 Other secondary pulmonary hypertension: Secondary | ICD-10-CM | POA: Diagnosis not present

## 2016-06-19 DIAGNOSIS — I2510 Atherosclerotic heart disease of native coronary artery without angina pectoris: Secondary | ICD-10-CM | POA: Diagnosis not present

## 2016-06-25 DIAGNOSIS — M109 Gout, unspecified: Secondary | ICD-10-CM | POA: Diagnosis not present

## 2016-06-25 DIAGNOSIS — E1165 Type 2 diabetes mellitus with hyperglycemia: Secondary | ICD-10-CM | POA: Diagnosis not present

## 2016-07-03 DIAGNOSIS — E1165 Type 2 diabetes mellitus with hyperglycemia: Secondary | ICD-10-CM | POA: Diagnosis not present

## 2016-07-03 DIAGNOSIS — M79671 Pain in right foot: Secondary | ICD-10-CM | POA: Diagnosis not present

## 2016-07-03 DIAGNOSIS — M109 Gout, unspecified: Secondary | ICD-10-CM | POA: Diagnosis not present

## 2016-07-03 DIAGNOSIS — N189 Chronic kidney disease, unspecified: Secondary | ICD-10-CM | POA: Diagnosis not present

## 2016-07-04 DIAGNOSIS — D509 Iron deficiency anemia, unspecified: Secondary | ICD-10-CM | POA: Diagnosis not present

## 2016-07-13 DIAGNOSIS — I10 Essential (primary) hypertension: Secondary | ICD-10-CM | POA: Diagnosis not present

## 2016-07-17 DIAGNOSIS — I10 Essential (primary) hypertension: Secondary | ICD-10-CM | POA: Diagnosis not present

## 2016-08-03 DIAGNOSIS — I10 Essential (primary) hypertension: Secondary | ICD-10-CM | POA: Diagnosis not present

## 2016-08-03 DIAGNOSIS — M109 Gout, unspecified: Secondary | ICD-10-CM | POA: Diagnosis not present

## 2016-08-03 DIAGNOSIS — E1165 Type 2 diabetes mellitus with hyperglycemia: Secondary | ICD-10-CM | POA: Diagnosis not present

## 2016-08-03 DIAGNOSIS — Z9181 History of falling: Secondary | ICD-10-CM | POA: Diagnosis not present

## 2016-09-17 DIAGNOSIS — M545 Low back pain: Secondary | ICD-10-CM | POA: Diagnosis not present

## 2016-09-17 DIAGNOSIS — I10 Essential (primary) hypertension: Secondary | ICD-10-CM | POA: Diagnosis not present

## 2016-09-24 DIAGNOSIS — R0600 Dyspnea, unspecified: Secondary | ICD-10-CM | POA: Diagnosis not present

## 2016-09-24 DIAGNOSIS — R531 Weakness: Secondary | ICD-10-CM | POA: Diagnosis not present

## 2016-09-24 DIAGNOSIS — K922 Gastrointestinal hemorrhage, unspecified: Secondary | ICD-10-CM | POA: Diagnosis not present

## 2016-09-24 DIAGNOSIS — D500 Iron deficiency anemia secondary to blood loss (chronic): Secondary | ICD-10-CM | POA: Diagnosis not present

## 2016-09-24 DIAGNOSIS — E538 Deficiency of other specified B group vitamins: Secondary | ICD-10-CM | POA: Diagnosis not present

## 2016-09-25 DIAGNOSIS — I739 Peripheral vascular disease, unspecified: Secondary | ICD-10-CM | POA: Diagnosis not present

## 2016-09-25 DIAGNOSIS — E785 Hyperlipidemia, unspecified: Secondary | ICD-10-CM | POA: Diagnosis not present

## 2016-09-25 DIAGNOSIS — I272 Pulmonary hypertension, unspecified: Secondary | ICD-10-CM | POA: Diagnosis not present

## 2016-09-25 DIAGNOSIS — I2510 Atherosclerotic heart disease of native coronary artery without angina pectoris: Secondary | ICD-10-CM | POA: Diagnosis not present

## 2016-09-25 DIAGNOSIS — Z951 Presence of aortocoronary bypass graft: Secondary | ICD-10-CM | POA: Diagnosis not present

## 2016-10-15 DIAGNOSIS — J189 Pneumonia, unspecified organism: Secondary | ICD-10-CM | POA: Diagnosis not present

## 2016-10-22 DIAGNOSIS — Q2730 Arteriovenous malformation, site unspecified: Secondary | ICD-10-CM | POA: Diagnosis not present

## 2016-10-22 DIAGNOSIS — D500 Iron deficiency anemia secondary to blood loss (chronic): Secondary | ICD-10-CM | POA: Diagnosis not present

## 2016-10-22 DIAGNOSIS — Z0001 Encounter for general adult medical examination with abnormal findings: Secondary | ICD-10-CM | POA: Diagnosis not present

## 2016-10-22 DIAGNOSIS — Q2733 Arteriovenous malformation of digestive system vessel: Secondary | ICD-10-CM | POA: Diagnosis not present

## 2016-10-22 DIAGNOSIS — D649 Anemia, unspecified: Secondary | ICD-10-CM | POA: Diagnosis not present

## 2016-10-29 DIAGNOSIS — D500 Iron deficiency anemia secondary to blood loss (chronic): Secondary | ICD-10-CM | POA: Diagnosis not present

## 2016-11-20 DIAGNOSIS — I10 Essential (primary) hypertension: Secondary | ICD-10-CM | POA: Diagnosis not present

## 2016-11-20 DIAGNOSIS — Z2821 Immunization not carried out because of patient refusal: Secondary | ICD-10-CM | POA: Diagnosis not present

## 2016-11-20 DIAGNOSIS — R5383 Other fatigue: Secondary | ICD-10-CM | POA: Diagnosis not present

## 2016-11-20 DIAGNOSIS — I499 Cardiac arrhythmia, unspecified: Secondary | ICD-10-CM | POA: Diagnosis not present

## 2016-11-20 DIAGNOSIS — D518 Other vitamin B12 deficiency anemias: Secondary | ICD-10-CM | POA: Diagnosis not present

## 2016-11-20 DIAGNOSIS — I259 Chronic ischemic heart disease, unspecified: Secondary | ICD-10-CM | POA: Diagnosis not present

## 2016-11-20 DIAGNOSIS — E1165 Type 2 diabetes mellitus with hyperglycemia: Secondary | ICD-10-CM | POA: Diagnosis not present

## 2016-11-26 DIAGNOSIS — I480 Paroxysmal atrial fibrillation: Secondary | ICD-10-CM

## 2016-11-26 DIAGNOSIS — Z122 Encounter for screening for malignant neoplasm of respiratory organs: Secondary | ICD-10-CM | POA: Diagnosis not present

## 2016-11-26 DIAGNOSIS — Z87891 Personal history of nicotine dependence: Secondary | ICD-10-CM | POA: Diagnosis not present

## 2016-11-26 DIAGNOSIS — I25810 Atherosclerosis of coronary artery bypass graft(s) without angina pectoris: Secondary | ICD-10-CM | POA: Diagnosis not present

## 2016-11-26 DIAGNOSIS — I700 Atherosclerosis of aorta: Secondary | ICD-10-CM | POA: Diagnosis not present

## 2016-11-26 DIAGNOSIS — G4733 Obstructive sleep apnea (adult) (pediatric): Secondary | ICD-10-CM | POA: Diagnosis not present

## 2016-11-26 DIAGNOSIS — I739 Peripheral vascular disease, unspecified: Secondary | ICD-10-CM | POA: Diagnosis not present

## 2016-11-26 DIAGNOSIS — Z951 Presence of aortocoronary bypass graft: Secondary | ICD-10-CM | POA: Diagnosis not present

## 2016-11-26 DIAGNOSIS — I272 Pulmonary hypertension, unspecified: Secondary | ICD-10-CM | POA: Diagnosis not present

## 2016-11-26 DIAGNOSIS — E785 Hyperlipidemia, unspecified: Secondary | ICD-10-CM | POA: Diagnosis not present

## 2016-11-26 DIAGNOSIS — J439 Emphysema, unspecified: Secondary | ICD-10-CM | POA: Diagnosis not present

## 2016-11-26 DIAGNOSIS — I2510 Atherosclerotic heart disease of native coronary artery without angina pectoris: Secondary | ICD-10-CM | POA: Diagnosis not present

## 2016-11-26 HISTORY — DX: Paroxysmal atrial fibrillation: I48.0

## 2016-12-18 DIAGNOSIS — E785 Hyperlipidemia, unspecified: Secondary | ICD-10-CM | POA: Diagnosis not present

## 2016-12-18 DIAGNOSIS — I272 Pulmonary hypertension, unspecified: Secondary | ICD-10-CM | POA: Diagnosis not present

## 2016-12-18 DIAGNOSIS — I480 Paroxysmal atrial fibrillation: Secondary | ICD-10-CM | POA: Diagnosis not present

## 2016-12-18 DIAGNOSIS — I2510 Atherosclerotic heart disease of native coronary artery without angina pectoris: Secondary | ICD-10-CM | POA: Diagnosis not present

## 2016-12-18 DIAGNOSIS — G4733 Obstructive sleep apnea (adult) (pediatric): Secondary | ICD-10-CM | POA: Diagnosis not present

## 2016-12-18 DIAGNOSIS — I739 Peripheral vascular disease, unspecified: Secondary | ICD-10-CM | POA: Diagnosis not present

## 2016-12-18 DIAGNOSIS — Z951 Presence of aortocoronary bypass graft: Secondary | ICD-10-CM | POA: Diagnosis not present

## 2016-12-21 DIAGNOSIS — M25512 Pain in left shoulder: Secondary | ICD-10-CM | POA: Diagnosis not present

## 2016-12-21 DIAGNOSIS — J441 Chronic obstructive pulmonary disease with (acute) exacerbation: Secondary | ICD-10-CM | POA: Diagnosis not present

## 2016-12-29 DIAGNOSIS — M7542 Impingement syndrome of left shoulder: Secondary | ICD-10-CM | POA: Diagnosis not present

## 2016-12-29 DIAGNOSIS — M25512 Pain in left shoulder: Secondary | ICD-10-CM | POA: Diagnosis not present

## 2016-12-29 DIAGNOSIS — G8929 Other chronic pain: Secondary | ICD-10-CM | POA: Diagnosis not present

## 2017-01-08 DIAGNOSIS — M25512 Pain in left shoulder: Secondary | ICD-10-CM | POA: Diagnosis not present

## 2017-01-08 DIAGNOSIS — M6281 Muscle weakness (generalized): Secondary | ICD-10-CM | POA: Diagnosis not present

## 2017-01-08 DIAGNOSIS — M7542 Impingement syndrome of left shoulder: Secondary | ICD-10-CM | POA: Diagnosis not present

## 2017-01-11 DIAGNOSIS — M6281 Muscle weakness (generalized): Secondary | ICD-10-CM | POA: Diagnosis not present

## 2017-01-11 DIAGNOSIS — M7542 Impingement syndrome of left shoulder: Secondary | ICD-10-CM | POA: Diagnosis not present

## 2017-01-11 DIAGNOSIS — M25512 Pain in left shoulder: Secondary | ICD-10-CM | POA: Diagnosis not present

## 2017-01-15 DIAGNOSIS — M7542 Impingement syndrome of left shoulder: Secondary | ICD-10-CM | POA: Diagnosis not present

## 2017-01-15 DIAGNOSIS — M25512 Pain in left shoulder: Secondary | ICD-10-CM | POA: Diagnosis not present

## 2017-01-15 DIAGNOSIS — M6281 Muscle weakness (generalized): Secondary | ICD-10-CM | POA: Diagnosis not present

## 2017-01-16 DIAGNOSIS — I480 Paroxysmal atrial fibrillation: Secondary | ICD-10-CM | POA: Diagnosis not present

## 2017-01-16 DIAGNOSIS — G4733 Obstructive sleep apnea (adult) (pediatric): Secondary | ICD-10-CM | POA: Diagnosis not present

## 2017-01-16 DIAGNOSIS — I739 Peripheral vascular disease, unspecified: Secondary | ICD-10-CM | POA: Diagnosis not present

## 2017-01-16 DIAGNOSIS — E785 Hyperlipidemia, unspecified: Secondary | ICD-10-CM | POA: Diagnosis not present

## 2017-01-16 DIAGNOSIS — Z951 Presence of aortocoronary bypass graft: Secondary | ICD-10-CM | POA: Diagnosis not present

## 2017-01-16 DIAGNOSIS — I272 Pulmonary hypertension, unspecified: Secondary | ICD-10-CM | POA: Diagnosis not present

## 2017-01-16 DIAGNOSIS — I2510 Atherosclerotic heart disease of native coronary artery without angina pectoris: Secondary | ICD-10-CM | POA: Diagnosis not present

## 2017-01-17 DIAGNOSIS — M25512 Pain in left shoulder: Secondary | ICD-10-CM | POA: Diagnosis not present

## 2017-01-17 DIAGNOSIS — M6281 Muscle weakness (generalized): Secondary | ICD-10-CM | POA: Diagnosis not present

## 2017-01-17 DIAGNOSIS — M7542 Impingement syndrome of left shoulder: Secondary | ICD-10-CM | POA: Diagnosis not present

## 2017-01-18 DIAGNOSIS — M7542 Impingement syndrome of left shoulder: Secondary | ICD-10-CM | POA: Diagnosis not present

## 2017-01-18 DIAGNOSIS — M25512 Pain in left shoulder: Secondary | ICD-10-CM | POA: Diagnosis not present

## 2017-01-18 DIAGNOSIS — M6281 Muscle weakness (generalized): Secondary | ICD-10-CM | POA: Diagnosis not present

## 2017-01-21 DIAGNOSIS — M6281 Muscle weakness (generalized): Secondary | ICD-10-CM | POA: Diagnosis not present

## 2017-01-21 DIAGNOSIS — M25512 Pain in left shoulder: Secondary | ICD-10-CM | POA: Diagnosis not present

## 2017-01-21 DIAGNOSIS — D509 Iron deficiency anemia, unspecified: Secondary | ICD-10-CM | POA: Diagnosis not present

## 2017-01-21 DIAGNOSIS — M7542 Impingement syndrome of left shoulder: Secondary | ICD-10-CM | POA: Diagnosis not present

## 2017-01-21 DIAGNOSIS — Z510 Encounter for antineoplastic radiation therapy: Secondary | ICD-10-CM | POA: Diagnosis not present

## 2017-01-21 DIAGNOSIS — C9110 Chronic lymphocytic leukemia of B-cell type not having achieved remission: Secondary | ICD-10-CM | POA: Diagnosis not present

## 2017-01-23 DIAGNOSIS — M25512 Pain in left shoulder: Secondary | ICD-10-CM | POA: Diagnosis not present

## 2017-01-23 DIAGNOSIS — M6281 Muscle weakness (generalized): Secondary | ICD-10-CM | POA: Diagnosis not present

## 2017-01-23 DIAGNOSIS — M7542 Impingement syndrome of left shoulder: Secondary | ICD-10-CM | POA: Diagnosis not present

## 2017-01-25 DIAGNOSIS — M7542 Impingement syndrome of left shoulder: Secondary | ICD-10-CM | POA: Diagnosis not present

## 2017-01-25 DIAGNOSIS — M6281 Muscle weakness (generalized): Secondary | ICD-10-CM | POA: Diagnosis not present

## 2017-01-25 DIAGNOSIS — M25512 Pain in left shoulder: Secondary | ICD-10-CM | POA: Diagnosis not present

## 2017-01-28 DIAGNOSIS — M25512 Pain in left shoulder: Secondary | ICD-10-CM | POA: Diagnosis not present

## 2017-01-28 DIAGNOSIS — M7542 Impingement syndrome of left shoulder: Secondary | ICD-10-CM | POA: Diagnosis not present

## 2017-01-28 DIAGNOSIS — M6281 Muscle weakness (generalized): Secondary | ICD-10-CM | POA: Diagnosis not present

## 2017-01-30 DIAGNOSIS — Z79899 Other long term (current) drug therapy: Secondary | ICD-10-CM | POA: Diagnosis not present

## 2017-01-30 DIAGNOSIS — I480 Paroxysmal atrial fibrillation: Secondary | ICD-10-CM | POA: Diagnosis not present

## 2017-01-30 DIAGNOSIS — J449 Chronic obstructive pulmonary disease, unspecified: Secondary | ICD-10-CM | POA: Diagnosis not present

## 2017-01-30 DIAGNOSIS — I4891 Unspecified atrial fibrillation: Secondary | ICD-10-CM | POA: Diagnosis not present

## 2017-01-30 DIAGNOSIS — Z7901 Long term (current) use of anticoagulants: Secondary | ICD-10-CM | POA: Diagnosis not present

## 2017-01-30 DIAGNOSIS — I2510 Atherosclerotic heart disease of native coronary artery without angina pectoris: Secondary | ICD-10-CM | POA: Diagnosis not present

## 2017-01-30 DIAGNOSIS — I4892 Unspecified atrial flutter: Secondary | ICD-10-CM | POA: Diagnosis not present

## 2017-01-30 DIAGNOSIS — G4733 Obstructive sleep apnea (adult) (pediatric): Secondary | ICD-10-CM | POA: Diagnosis not present

## 2017-01-30 DIAGNOSIS — E785 Hyperlipidemia, unspecified: Secondary | ICD-10-CM | POA: Diagnosis not present

## 2017-01-30 DIAGNOSIS — I272 Pulmonary hypertension, unspecified: Secondary | ICD-10-CM | POA: Diagnosis not present

## 2017-02-01 DIAGNOSIS — M25512 Pain in left shoulder: Secondary | ICD-10-CM | POA: Diagnosis not present

## 2017-02-01 DIAGNOSIS — M6281 Muscle weakness (generalized): Secondary | ICD-10-CM | POA: Diagnosis not present

## 2017-02-01 DIAGNOSIS — M7542 Impingement syndrome of left shoulder: Secondary | ICD-10-CM | POA: Diagnosis not present

## 2017-02-04 DIAGNOSIS — M6281 Muscle weakness (generalized): Secondary | ICD-10-CM | POA: Diagnosis not present

## 2017-02-04 DIAGNOSIS — M25512 Pain in left shoulder: Secondary | ICD-10-CM | POA: Diagnosis not present

## 2017-02-04 DIAGNOSIS — M7542 Impingement syndrome of left shoulder: Secondary | ICD-10-CM | POA: Diagnosis not present

## 2017-02-06 DIAGNOSIS — M25512 Pain in left shoulder: Secondary | ICD-10-CM | POA: Diagnosis not present

## 2017-02-06 DIAGNOSIS — M7542 Impingement syndrome of left shoulder: Secondary | ICD-10-CM | POA: Diagnosis not present

## 2017-02-06 DIAGNOSIS — G8929 Other chronic pain: Secondary | ICD-10-CM | POA: Diagnosis not present

## 2017-02-08 DIAGNOSIS — M7542 Impingement syndrome of left shoulder: Secondary | ICD-10-CM | POA: Diagnosis not present

## 2017-02-08 DIAGNOSIS — M25512 Pain in left shoulder: Secondary | ICD-10-CM | POA: Diagnosis not present

## 2017-02-08 DIAGNOSIS — M6281 Muscle weakness (generalized): Secondary | ICD-10-CM | POA: Diagnosis not present

## 2017-02-11 DIAGNOSIS — M25512 Pain in left shoulder: Secondary | ICD-10-CM | POA: Diagnosis not present

## 2017-02-11 DIAGNOSIS — M6281 Muscle weakness (generalized): Secondary | ICD-10-CM | POA: Diagnosis not present

## 2017-02-11 DIAGNOSIS — M7542 Impingement syndrome of left shoulder: Secondary | ICD-10-CM | POA: Diagnosis not present

## 2017-02-13 DIAGNOSIS — M25512 Pain in left shoulder: Secondary | ICD-10-CM | POA: Diagnosis not present

## 2017-02-13 DIAGNOSIS — M6281 Muscle weakness (generalized): Secondary | ICD-10-CM | POA: Diagnosis not present

## 2017-02-13 DIAGNOSIS — M7542 Impingement syndrome of left shoulder: Secondary | ICD-10-CM | POA: Diagnosis not present

## 2017-02-15 DIAGNOSIS — M7542 Impingement syndrome of left shoulder: Secondary | ICD-10-CM | POA: Diagnosis not present

## 2017-02-15 DIAGNOSIS — M25512 Pain in left shoulder: Secondary | ICD-10-CM | POA: Diagnosis not present

## 2017-02-15 DIAGNOSIS — M6281 Muscle weakness (generalized): Secondary | ICD-10-CM | POA: Diagnosis not present

## 2017-02-18 DIAGNOSIS — M25512 Pain in left shoulder: Secondary | ICD-10-CM | POA: Diagnosis not present

## 2017-02-18 DIAGNOSIS — M6281 Muscle weakness (generalized): Secondary | ICD-10-CM | POA: Diagnosis not present

## 2017-02-18 DIAGNOSIS — M7542 Impingement syndrome of left shoulder: Secondary | ICD-10-CM | POA: Diagnosis not present

## 2017-02-20 DIAGNOSIS — M6281 Muscle weakness (generalized): Secondary | ICD-10-CM | POA: Diagnosis not present

## 2017-02-20 DIAGNOSIS — M25512 Pain in left shoulder: Secondary | ICD-10-CM | POA: Diagnosis not present

## 2017-02-20 DIAGNOSIS — M7542 Impingement syndrome of left shoulder: Secondary | ICD-10-CM | POA: Diagnosis not present

## 2017-02-22 DIAGNOSIS — M25512 Pain in left shoulder: Secondary | ICD-10-CM | POA: Diagnosis not present

## 2017-02-22 DIAGNOSIS — M6281 Muscle weakness (generalized): Secondary | ICD-10-CM | POA: Diagnosis not present

## 2017-02-22 DIAGNOSIS — M7542 Impingement syndrome of left shoulder: Secondary | ICD-10-CM | POA: Diagnosis not present

## 2017-02-27 DIAGNOSIS — M25512 Pain in left shoulder: Secondary | ICD-10-CM | POA: Diagnosis not present

## 2017-02-27 DIAGNOSIS — M6281 Muscle weakness (generalized): Secondary | ICD-10-CM | POA: Diagnosis not present

## 2017-02-27 DIAGNOSIS — M7542 Impingement syndrome of left shoulder: Secondary | ICD-10-CM | POA: Diagnosis not present

## 2017-04-05 DIAGNOSIS — I4891 Unspecified atrial fibrillation: Secondary | ICD-10-CM | POA: Diagnosis not present

## 2017-04-05 DIAGNOSIS — Z6839 Body mass index (BMI) 39.0-39.9, adult: Secondary | ICD-10-CM | POA: Diagnosis not present

## 2017-04-08 DIAGNOSIS — I480 Paroxysmal atrial fibrillation: Secondary | ICD-10-CM | POA: Diagnosis not present

## 2017-04-08 DIAGNOSIS — I4892 Unspecified atrial flutter: Secondary | ICD-10-CM | POA: Diagnosis not present

## 2017-04-08 DIAGNOSIS — Z7901 Long term (current) use of anticoagulants: Secondary | ICD-10-CM | POA: Diagnosis not present

## 2017-04-15 DIAGNOSIS — E611 Iron deficiency: Secondary | ICD-10-CM | POA: Diagnosis not present

## 2017-04-15 DIAGNOSIS — K5521 Angiodysplasia of colon with hemorrhage: Secondary | ICD-10-CM | POA: Diagnosis not present

## 2017-04-15 DIAGNOSIS — D500 Iron deficiency anemia secondary to blood loss (chronic): Secondary | ICD-10-CM | POA: Diagnosis not present

## 2017-04-19 DIAGNOSIS — D500 Iron deficiency anemia secondary to blood loss (chronic): Secondary | ICD-10-CM | POA: Diagnosis not present

## 2017-05-08 DIAGNOSIS — G4733 Obstructive sleep apnea (adult) (pediatric): Secondary | ICD-10-CM | POA: Diagnosis not present

## 2017-05-08 DIAGNOSIS — I739 Peripheral vascular disease, unspecified: Secondary | ICD-10-CM | POA: Diagnosis not present

## 2017-05-08 DIAGNOSIS — E785 Hyperlipidemia, unspecified: Secondary | ICD-10-CM | POA: Diagnosis not present

## 2017-05-08 DIAGNOSIS — I272 Pulmonary hypertension, unspecified: Secondary | ICD-10-CM | POA: Diagnosis not present

## 2017-05-08 DIAGNOSIS — I2510 Atherosclerotic heart disease of native coronary artery without angina pectoris: Secondary | ICD-10-CM | POA: Diagnosis not present

## 2017-05-08 DIAGNOSIS — I480 Paroxysmal atrial fibrillation: Secondary | ICD-10-CM | POA: Diagnosis not present

## 2017-05-08 DIAGNOSIS — Z951 Presence of aortocoronary bypass graft: Secondary | ICD-10-CM | POA: Diagnosis not present

## 2017-06-25 ENCOUNTER — Ambulatory Visit (INDEPENDENT_AMBULATORY_CARE_PROVIDER_SITE_OTHER): Payer: Medicare Other | Admitting: Cardiology

## 2017-06-25 ENCOUNTER — Encounter: Payer: Self-pay | Admitting: Cardiology

## 2017-06-25 ENCOUNTER — Encounter: Payer: Self-pay | Admitting: Neurology

## 2017-06-25 VITALS — BP 114/66 | HR 74 | Resp 10 | Ht 72.0 in | Wt 246.0 lb

## 2017-06-25 DIAGNOSIS — I10 Essential (primary) hypertension: Secondary | ICD-10-CM | POA: Diagnosis not present

## 2017-06-25 DIAGNOSIS — E782 Mixed hyperlipidemia: Secondary | ICD-10-CM

## 2017-06-25 DIAGNOSIS — I2510 Atherosclerotic heart disease of native coronary artery without angina pectoris: Secondary | ICD-10-CM | POA: Diagnosis not present

## 2017-06-25 DIAGNOSIS — I480 Paroxysmal atrial fibrillation: Secondary | ICD-10-CM

## 2017-06-25 DIAGNOSIS — D649 Anemia, unspecified: Secondary | ICD-10-CM

## 2017-06-25 NOTE — Progress Notes (Signed)
Cardiology Office Note:    Date:  06/25/2017   ID:  Lawrence [name], DOB [birth year], MRN [id number]  PCP:  [name], MD  Cardiologist:  [name], MD    Referring MD: [name], MD   No chief complaint on file. Weakness and fatigue  History of Present Illness:    Lawrence [name] is a 71 y.o. male  coronary artery disease, proximal mitral fibrillation, hypertension. He came immediately overall doing well but described Of weakness and fatigue with blood pressure dropping to 90 systolic. His wife did EKG while he was having it I located the rhythm strips and I can't tell for sure if this is atrial fibrillation.. Rate appears to be fairly regular but I do not see P waves. We'll do an EKG today to check the rhythm. I asked them to discontinue these for [place] tonight and watch his weight. I'll also ask him to repeat EKG when he had some symptoms. We still debating about potentially restarting amiodarone. I will ask him to have CBC done today to check for anemia. I see him back in my office in one month.  Past Medical History:  Diagnosis Date  . Anemia   . Chronic airway obstruction, not elsewhere classified ([place])   . Chronic ischemic heart disease, unspecified   . Coronary artery disease   . Diabetes mellitus    type II, neuropathy,   . Essential hypertension, benign   . Hyperlipidemia, mixed   . Nodule of kidney    incidentally found on CT abdomen 11/2012.  Pending Urology evaluation.   . Nontoxic multinodular goiter   . Obesity   . Other testicular hypofunction   . PUD (peptic ulcer disease)   . Renal insufficiency, mild    hx of    Past Surgical History:  Procedure Laterality Date  . CHOLECYSTECTOMY    . CORONARY ARTERY BYPASS GRAFT  2007   x 4  . ESOPHAGOGASTRODUODENOSCOPY     10/06/2012:  hiatal hernia, moderate gastritis.  2012: Colonoscopy at [facility]:  one polyp.  Due next 2017  . SHOULDER SURGERY     left shoulder  . TONSILECTOMY, ADENOIDECTOMY,  BILATERAL MYRINGOTOMY AND TUBES      Current Medications: No outpatient prescriptions have been marked as taking for the 06/25/17 encounter (Office Visit) with Park Liter, MD.     Allergies:   Patient has no known allergies.   Social History   Social History  . Marital status: Married    Spouse name: N/A  . Number of children: 5  . Years of education: N/A   Occupational History  . Retired     Medical illustrator; retired Chiropodist   Social History Main Topics  . Smoking status: Former Smoker    Packs/day: 1.50    Years: 30.00    Types: Cigarettes    Quit date: 12/24/2005  . Smokeless tobacco: Never Used  . Alcohol use No  . Drug use: No  . Sexual activity: Not on file   Other Topics Concern  . Not on file   Social History Narrative   Lives with wife in a one story home.  Has 6 children.  Retired Nature conservation officer.  Education: college.     Family History: The patient's family history includes Asthma in his sister; Cancer in his father and mother. ROS:   Please see the history of present illness.     All other systems reviewed and are negative.  EKGs/Labs/Other Studies  Reviewed:      Recent Labs: No results found for requested labs within last 8760 hours.  Recent Lipid Panel No results found for: CHOL, TRIG, HDL, CHOLHDL, VLDL, LDLCALC, LDLDIRECT  Physical Exam:    VS:  BP 114/66   Pulse 74   Resp 10   Ht 6' (1.829 m)   Wt 246 lb (111.6 kg)   BMI 33.36 kg/m     Wt Readings from Last 3 Encounters:  06/25/17 246 lb (111.6 kg)  04/26/16 248 lb 2 oz (112.5 kg)  03/29/15 273 lb 12.8 oz (124.2 kg)     GEN:  Well nourished, well developed in no acute distress HEENT: Normal NECK: No JVD; No carotid bruits LYMPHATICS: No lymphadenopathy CARDIAC: RRR, no murmurs, no rubs, no gallops RESPIRATORY:  Clear to auscultation without rales, wheezing or rhonchi  ABDOMEN: Soft, non-tender, non-distended MUSCULOSKELETAL:  No edema; No deformity  SKIN: Warm and  dry LOWER EXTREMITIES: no swelling NEUROLOGIC:  Alert and oriented x 3 PSYCHIATRIC:  Normal affect   ASSESSMENT:    1. Coronary artery disease involving native coronary artery of native heart without angina pectoris   2. Paroxysmal atrial fibrillation (HCC)   3. Essential hypertension, benign   4. Hyperlipidemia, mixed   5. Anemia, unspecified type    PLAN:    In order of problems listed above:  1. Coronary artery disease: Stable, asymptomatic we'll continue present management. 2. Paroxysmal atrial fibrillation: We'll repeat EKG to check the rhythm.            EKGs done today showed atrial fibrillation with controlled ventricular rate. He is anticoagulated which I'll continue, we will check a CBC today, I lowered down his diastolic to only 5 mg daily and I starting back on amiodarone 200 mg twice a day. He will be back here in about one month to see if we need to schedule electrical cardioversion. 3. COPD: Will continue present management.   Medication Adjustments/Labs and Tests Ordered: Current medicines are reviewed at length with the patient today.  Concerns regarding medicines are outlined above.  No orders of the defined types were placed in this encounter.  No orders of the defined types were placed in this encounter.   Signed, [name], MD  06/25/2017 11:26 AM    [facility]

## 2017-06-25 NOTE — Patient Instructions (Signed)
Medication Instructions:  Your physician recommends that you continue on your current medications as directed. Please refer to the Current Medication list given to you today.   Labwork: Your physician recommends that you return for lab work in:  Today   Testing/Procedures: ECG performed in office today.   Follow-Up: Your physician recommends that you schedule a follow-up appointment in: 1 month    Any Other Special Instructions Will Be Listed Below (If Applicable).     If you need a refill on your cardiac medications before your next appointment, please call your pharmacy.

## 2017-06-26 LAB — CBC WITH DIFFERENTIAL/PLATELET
BASOS ABS: 0.1 10*3/uL (ref 0.0–0.2)
BASOS: 1 %
EOS (ABSOLUTE): 0.2 10*3/uL (ref 0.0–0.4)
Eos: 2 %
Hematocrit: 38.2 % (ref 37.5–51.0)
Hemoglobin: 12.6 g/dL — ABNORMAL LOW (ref 13.0–17.7)
IMMATURE GRANULOCYTES: 1 %
Immature Grans (Abs): 0.1 10*3/uL (ref 0.0–0.1)
Lymphocytes Absolute: 1.7 10*3/uL (ref 0.7–3.1)
Lymphs: 14 %
MCH: 31.7 pg (ref 26.6–33.0)
MCHC: 33 g/dL (ref 31.5–35.7)
MCV: 96 fL (ref 79–97)
MONOS ABS: 1.1 10*3/uL — AB (ref 0.1–0.9)
Monocytes: 9 %
NEUTROS PCT: 73 %
Neutrophils Absolute: 9 10*3/uL — ABNORMAL HIGH (ref 1.4–7.0)
PLATELETS: 251 10*3/uL (ref 150–379)
RBC: 3.97 x10E6/uL — ABNORMAL LOW (ref 4.14–5.80)
RDW: 14.5 % (ref 12.3–15.4)
WBC: 12.1 10*3/uL — ABNORMAL HIGH (ref 3.4–10.8)

## 2017-06-27 ENCOUNTER — Encounter: Payer: Self-pay | Admitting: Cardiology

## 2017-07-03 ENCOUNTER — Encounter: Payer: Self-pay | Admitting: Cardiology

## 2017-07-04 DIAGNOSIS — Z0000 Encounter for general adult medical examination without abnormal findings: Secondary | ICD-10-CM | POA: Diagnosis not present

## 2017-07-04 DIAGNOSIS — E1165 Type 2 diabetes mellitus with hyperglycemia: Secondary | ICD-10-CM | POA: Diagnosis not present

## 2017-07-04 DIAGNOSIS — N289 Disorder of kidney and ureter, unspecified: Secondary | ICD-10-CM | POA: Diagnosis not present

## 2017-07-04 DIAGNOSIS — Z6839 Body mass index (BMI) 39.0-39.9, adult: Secondary | ICD-10-CM | POA: Diagnosis not present

## 2017-07-04 DIAGNOSIS — D519 Vitamin B12 deficiency anemia, unspecified: Secondary | ICD-10-CM | POA: Diagnosis not present

## 2017-07-08 ENCOUNTER — Encounter: Payer: Self-pay | Admitting: Cardiology

## 2017-07-08 MED ORDER — NEBIVOLOL HCL 20 MG PO TABS: 10.0000 mg | ORAL_TABLET | Freq: Every day | ORAL | 3 refills | Status: DC

## 2017-07-12 DIAGNOSIS — D500 Iron deficiency anemia secondary to blood loss (chronic): Secondary | ICD-10-CM | POA: Diagnosis not present

## 2017-07-12 DIAGNOSIS — Q2733 Arteriovenous malformation of digestive system vessel: Secondary | ICD-10-CM | POA: Diagnosis not present

## 2017-07-12 DIAGNOSIS — K9421 Gastrostomy hemorrhage: Secondary | ICD-10-CM | POA: Diagnosis not present

## 2017-07-16 DIAGNOSIS — D500 Iron deficiency anemia secondary to blood loss (chronic): Secondary | ICD-10-CM | POA: Diagnosis not present

## 2017-07-17 DIAGNOSIS — Z6839 Body mass index (BMI) 39.0-39.9, adult: Secondary | ICD-10-CM | POA: Diagnosis not present

## 2017-07-17 DIAGNOSIS — J302 Other seasonal allergic rhinitis: Secondary | ICD-10-CM | POA: Diagnosis not present

## 2017-07-23 DIAGNOSIS — D500 Iron deficiency anemia secondary to blood loss (chronic): Secondary | ICD-10-CM | POA: Diagnosis not present

## 2017-07-26 ENCOUNTER — Encounter: Payer: Self-pay | Admitting: Cardiology

## 2017-07-26 ENCOUNTER — Ambulatory Visit (INDEPENDENT_AMBULATORY_CARE_PROVIDER_SITE_OTHER): Payer: Medicare Other | Admitting: Cardiology

## 2017-07-26 VITALS — BP 122/70 | HR 76 | Resp 12 | Ht 72.0 in | Wt 254.8 lb

## 2017-07-26 DIAGNOSIS — I10 Essential (primary) hypertension: Secondary | ICD-10-CM | POA: Diagnosis not present

## 2017-07-26 DIAGNOSIS — E782 Mixed hyperlipidemia: Secondary | ICD-10-CM

## 2017-07-26 DIAGNOSIS — I480 Paroxysmal atrial fibrillation: Secondary | ICD-10-CM | POA: Diagnosis not present

## 2017-07-26 DIAGNOSIS — I2510 Atherosclerotic heart disease of native coronary artery without angina pectoris: Secondary | ICD-10-CM

## 2017-07-26 NOTE — Progress Notes (Signed)
Cardiology Office Note:    Date:  07/26/2017   ID:  Lawrence [name], DOB 1946/11/29, MRN [id number]  PCP:  [name], MD  Cardiologist:  [name], MD    Referring MD: [name], MD   Chief Complaint  Patient presents with  . 1 month follow up  I'm doing well  History of Present Illness:    Lawrence [name] is a 71 y.o. male  with coronary artery disease as well as paroxysmal atrial fibrillation. He comes today to talk about potential cardioversion. He was on amiodarone previously and had cardioversion which was successful after that dose of amiodarone was decreased and then he showed up again with atrial fibrillation. I've seen him a few weeks ago and increased dose of amiodarone hoping that he will convert with a higher dose of amiodarone but that is not the case. He appears to be still in atrial fibrillation. He is anticoagulated which I will continue. The plan is to do EKG today to verify atrial fibrillation. Check amiodarone level and adjust the dose as needed if he is amiodarone level is therapeutic: We will arrange for cardioversion next week. Procedure was explained to him including all risks benefits as well as alternatives. He is willing to proceed.  Past Medical History:  Diagnosis Date  . Anemia   . Chronic airway obstruction, not elsewhere classified   . Chronic ischemic heart disease, unspecified   . Coronary artery disease   . Diabetes mellitus    type II, neuropathy,   . Diabetes mellitus without complication ([place])   . Essential hypertension, benign   . Hyperlipidemia   . Hyperlipidemia, mixed   . Hypertension   . Nodule of kidney    incidentally found on CT abdomen 11/2012.  Pending Urology evaluation.   . Nontoxic multinodular goiter   . Obesity   . Other testicular hypofunction   . PUD (peptic ulcer disease)   . Renal insufficiency, mild    hx of    Past Surgical History:  Procedure Laterality Date  . CHOLECYSTECTOMY    . CORONARY  ARTERY BYPASS GRAFT  2007   x 4  . CORONARY ARTERY BYPASS GRAFT    . ESOPHAGOGASTRODUODENOSCOPY     10/06/2012:  hiatal hernia, moderate gastritis.  2012: Colonoscopy at [facility]:  one polyp.  Due next 2017  . EYE SURGERY    . KNEE DEBRIDEMENT    . SHOULDER SURGERY     left shoulder  . SHOULDER SURGERY    . TONSILECTOMY, ADENOIDECTOMY, BILATERAL MYRINGOTOMY AND TUBES      Current Medications: Current Meds  Medication Sig  . apixaban (ELIQUIS) 5 MG TABS tablet Take 5 mg by mouth 2 (two) times daily.  . furosemide (LASIX) 20 MG tablet Take 20 mg by mouth daily.  Marland Kitchen gabapentin (NEURONTIN) 300 MG capsule Take 300 mg by mouth 3 (three) times daily.  Marland Kitchen glimepiride (AMARYL) 4 MG tablet Take 4 mg by mouth 2 (two) times daily.   Marland Kitchen liraglutide (VICTOZA) 18 MG/3ML SOPN Inject 1.8 mg into the skin.  . Nebivolol HCl 20 MG TABS Take 0.5 tablets (10 mg total) by mouth daily.  . nitroGLYCERIN (NITROSTAT) 0.4 MG SL tablet Place 0.4 mg under the tongue.  . pantoprazole (PROTONIX) 40 MG tablet Take 40 mg by mouth.  . potassium chloride SA (K-DUR,KLOR-CON) 20 MEQ tablet Take by mouth.  . simvastatin (ZOCOR) 20 MG tablet Take 20 mg by mouth.     Allergies:  Bactrim [sulfamethoxazole-trimethoprim]   Social History   Social History  . Marital status: Married    Spouse name: N/A  . Number of children: 5  . Years of education: N/A   Occupational History  . Retired     Medical illustrator; retired Chiropodist   Social History Main Topics  . Smoking status: Former Smoker    Packs/day: 1.50    Years: 30.00    Types: Cigarettes  . Smokeless tobacco: Former Systems developer    Types: Chew  . Alcohol use Yes  . Drug use: No  . Sexual activity: Not Asked   Other Topics Concern  . None   Social History Narrative   ** Merged History Encounter **       Lives with wife in a one story home.  Has 6 children.  Retired Nature conservation officer.  Education: college.     Family History: The patient's family history includes  Asthma in his sister; Cancer in his father and mother; Colon cancer in his mother; Emphysema in his unknown relative; Esophageal cancer in his father; Hypertension in his mother; Kidney cancer in his mother; Stroke in his father. ROS:   Please see the history of present illness.    All 14 point review of systems negative except as described per history of present illness  EKGs/Labs/Other Studies Reviewed:      Recent Labs: 06/25/2017: Hemoglobin 12.6; Platelets 251  Recent Lipid Panel No results found for: CHOL, TRIG, HDL, CHOLHDL, VLDL, LDLCALC, LDLDIRECT  Physical Exam:    VS:  BP 122/70   Pulse 76   Resp 12   Ht 6' (1.829 m)   Wt 254 lb 12.8 oz (115.6 kg)   BMI 34.56 kg/m     Wt Readings from Last 3 Encounters:  07/26/17 254 lb 12.8 oz (115.6 kg)  06/25/17 246 lb 6.4 oz (111.8 kg)  06/25/17 246 lb (111.6 kg)     GEN:  Well nourished, well developed in no acute distress HEENT: Normal NECK: No JVD; No carotid bruits LYMPHATICS: No lymphadenopathy CARDIAC: Irregularly irregular, no murmurs, no rubs, no gallops RESPIRATORY:  Clear to auscultation without rales, wheezing or rhonchi  ABDOMEN: Soft, non-tender, non-distended MUSCULOSKELETAL:  No edema; No deformity  SKIN: Warm and dry LOWER EXTREMITIES: no swelling NEUROLOGIC:  Alert and oriented x 3 PSYCHIATRIC:  Normal affect   ASSESSMENT:    1. Coronary artery disease involving native coronary artery of native heart without angina pectoris   2. Essential hypertension, benign   3. Paroxysmal atrial fibrillation (HCC)   4. Hyperlipidemia, mixed    PLAN:    In order of problems listed above:  1. Coronary artery disease: Seems to be fine from that point review of continue present management. 2. Essential hypertension: Well-controlled continue present management. 3. Paroxysmal atrial fibrillation: Anticoagulated which I will continue. Plan is outlined above. CHADS2Vas score is 3 4. Anemia: Followed by internal  medicine and hematology team.         Medication Adjustments/Labs and Tests Ordered: Current medicines are reviewed at length with the patient today.  Concerns regarding medicines are outlined above.  No orders of the defined types were placed in this encounter.  Medication changes: No orders of the defined types were placed in this encounter.   Signed, Park Liter, MD, [facility] 07/26/2017 11:13 AM    [place]

## 2017-07-26 NOTE — Addendum Note (Signed)
Addended by: Kathyrn Sheriff on: 07/26/2017 11:47 AM   Modules accepted: Orders

## 2017-07-26 NOTE — Patient Instructions (Addendum)
Medication Instructions:  Your physician recommends that you continue on your current medications as directed. Please refer to the Current Medication list given to you today.  Labwork: Your physician recommends that you have preoperative lab work today for your cardioversion next week: CBC; CMP; PT/INR; Amiodarone Level    Testing/Procedures: Your physician has requested that you have a Cardioversion. Electrical Cardioversion uses a jolt of electricity to your heart either through paddles or wired patches attached to your chest. This is a controlled, usually prescheduled, procedure. This procedure is done at the hospital and you are not awake during the procedure. You usually go home the day of the procedure. Please see the instruction sheet given to you today for more information.  Please ensure that you follow the Nothing to eat or Drink Guidelines after midnight the night before your procedure. Please make sure you take your Eliquis as directed. This does not need to be held. Please hold any diabetic medications prior to procedure. Your procedure will be set up for [facility] at which you will be receiving a phone call from pre-admitting.    Follow-Up: Your physician recommends that you schedule a follow-up appointment in: 4 weeks  Any Other Special Instructions Will Be Listed Below (If Applicable).     If you need a refill on your cardiac medications before your next appointment, please call your pharmacy.

## 2017-07-30 ENCOUNTER — Other Ambulatory Visit: Payer: Self-pay

## 2017-07-30 LAB — CBC WITH DIFFERENTIAL/PLATELET
Basophils Absolute: 0.1 10*3/uL (ref 0.0–0.2)
Basos: 1 %
EOS (ABSOLUTE): 0.2 10*3/uL (ref 0.0–0.4)
Eos: 2 %
Hematocrit: 37.2 % — ABNORMAL LOW (ref 37.5–51.0)
Hemoglobin: 12.2 g/dL — ABNORMAL LOW (ref 13.0–17.7)
IMMATURE GRANULOCYTES: 0 %
Immature Grans (Abs): 0 10*3/uL (ref 0.0–0.1)
Lymphocytes Absolute: 1.4 10*3/uL (ref 0.7–3.1)
Lymphs: 14 %
MCH: 31.3 pg (ref 26.6–33.0)
MCHC: 32.8 g/dL (ref 31.5–35.7)
MCV: 95 fL (ref 79–97)
MONOS ABS: 0.9 10*3/uL (ref 0.1–0.9)
Monocytes: 9 %
NEUTROS PCT: 74 %
Neutrophils Absolute: 7.4 10*3/uL — ABNORMAL HIGH (ref 1.4–7.0)
PLATELETS: 185 10*3/uL (ref 150–379)
RBC: 3.9 x10E6/uL — AB (ref 4.14–5.80)
RDW: 15 % (ref 12.3–15.4)
WBC: 10 10*3/uL (ref 3.4–10.8)

## 2017-07-30 LAB — COMPREHENSIVE METABOLIC PANEL
A/G RATIO: 1.6 (ref 1.2–2.2)
ALK PHOS: 131 IU/L — AB (ref 39–117)
ALT: 14 IU/L (ref 0–44)
AST: 19 IU/L (ref 0–40)
Albumin: 4.6 g/dL (ref 3.5–4.8)
BUN / CREAT RATIO: 19 (ref 10–24)
BUN: 34 mg/dL — ABNORMAL HIGH (ref 8–27)
Bilirubin Total: 0.7 mg/dL (ref 0.0–1.2)
CHLORIDE: 100 mmol/L (ref 96–106)
CO2: 26 mmol/L (ref 20–29)
CREATININE: 1.83 mg/dL — AB (ref 0.76–1.27)
Calcium: 9.9 mg/dL (ref 8.6–10.2)
GFR calc Af Amer: 42 mL/min/{1.73_m2} — ABNORMAL LOW (ref >59)
GFR calc non Af Amer: 36 mL/min/{1.73_m2} — ABNORMAL LOW (ref >59)
GLOBULIN, TOTAL: 2.9 g/dL (ref 1.5–4.5)
Glucose: 236 mg/dL — ABNORMAL HIGH (ref 65–99)
Potassium: 5 mmol/L (ref 3.5–5.2)
Sodium: 143 mmol/L (ref 134–144)
Total Protein: 7.5 g/dL (ref 6.0–8.5)

## 2017-07-30 LAB — AMIODARONE LEVEL
AMIODARONE LVL: 0.6 ug/mL — AB (ref 1.0–2.5)
N-DESETHYL-AMIODARONE: 0.5 ug/mL — AB (ref 1.0–2.5)

## 2017-07-30 LAB — PROTIME-INR
INR: 1.1 (ref 0.8–1.2)
Prothrombin Time: 11.3 s (ref 9.1–12.0)

## 2017-07-30 MED ORDER — AMIODARONE HCL 400 MG PO TABS: 400.0000 mg | ORAL_TABLET | Freq: Two times a day (BID) | ORAL | 0 refills | Status: DC

## 2017-08-05 ENCOUNTER — Other Ambulatory Visit: Payer: Self-pay

## 2017-08-05 MED ORDER — AMIODARONE HCL 400 MG PO TABS: 400.0000 mg | ORAL_TABLET | Freq: Two times a day (BID) | ORAL | 1 refills | Status: DC

## 2017-08-07 ENCOUNTER — Ambulatory Visit (INDEPENDENT_AMBULATORY_CARE_PROVIDER_SITE_OTHER): Payer: Medicare Other

## 2017-08-07 ENCOUNTER — Other Ambulatory Visit: Payer: Self-pay | Admitting: Cardiology

## 2017-08-07 DIAGNOSIS — Z79899 Other long term (current) drug therapy: Secondary | ICD-10-CM

## 2017-08-07 DIAGNOSIS — I4891 Unspecified atrial fibrillation: Secondary | ICD-10-CM

## 2017-08-07 DIAGNOSIS — Z5181 Encounter for therapeutic drug level monitoring: Secondary | ICD-10-CM | POA: Diagnosis not present

## 2017-08-07 NOTE — Progress Notes (Signed)
Pt presents to the office today for an EKG and amiodarone level. We increased the pts amiodarone to 400 mg BID due to recent lab work. Today we will repeat the Amiodarone level and determine when we need to set the patient up for ECV again. Pt was not in visual acute distress upon today's assessment.

## 2017-08-13 LAB — AMIODARONE LEVEL
AMIODARONE LVL: 1.9 ug/mL (ref 1.0–2.5)
N-DESETHYL-AMIODARONE: 0.8 ug/mL — AB (ref 1.0–2.5)

## 2017-08-15 ENCOUNTER — Encounter: Payer: Self-pay | Admitting: Cardiology

## 2017-08-20 DIAGNOSIS — G4733 Obstructive sleep apnea (adult) (pediatric): Secondary | ICD-10-CM | POA: Diagnosis not present

## 2017-08-20 DIAGNOSIS — Z79899 Other long term (current) drug therapy: Secondary | ICD-10-CM | POA: Diagnosis not present

## 2017-08-20 DIAGNOSIS — I25810 Atherosclerosis of coronary artery bypass graft(s) without angina pectoris: Secondary | ICD-10-CM | POA: Diagnosis present

## 2017-08-20 DIAGNOSIS — J449 Chronic obstructive pulmonary disease, unspecified: Secondary | ICD-10-CM | POA: Diagnosis present

## 2017-08-20 DIAGNOSIS — Z6836 Body mass index (BMI) 36.0-36.9, adult: Secondary | ICD-10-CM | POA: Diagnosis not present

## 2017-08-20 DIAGNOSIS — E1140 Type 2 diabetes mellitus with diabetic neuropathy, unspecified: Secondary | ICD-10-CM | POA: Diagnosis present

## 2017-08-20 DIAGNOSIS — E785 Hyperlipidemia, unspecified: Secondary | ICD-10-CM | POA: Diagnosis not present

## 2017-08-20 DIAGNOSIS — I4891 Unspecified atrial fibrillation: Secondary | ICD-10-CM | POA: Diagnosis not present

## 2017-08-20 DIAGNOSIS — E1122 Type 2 diabetes mellitus with diabetic chronic kidney disease: Secondary | ICD-10-CM | POA: Diagnosis present

## 2017-08-20 DIAGNOSIS — Z951 Presence of aortocoronary bypass graft: Secondary | ICD-10-CM | POA: Diagnosis not present

## 2017-08-20 DIAGNOSIS — M109 Gout, unspecified: Secondary | ICD-10-CM | POA: Diagnosis present

## 2017-08-20 DIAGNOSIS — Z881 Allergy status to other antibiotic agents status: Secondary | ICD-10-CM | POA: Diagnosis not present

## 2017-08-20 DIAGNOSIS — I272 Pulmonary hypertension, unspecified: Secondary | ICD-10-CM | POA: Diagnosis not present

## 2017-08-20 DIAGNOSIS — I4892 Unspecified atrial flutter: Secondary | ICD-10-CM | POA: Diagnosis not present

## 2017-08-20 DIAGNOSIS — I2510 Atherosclerotic heart disease of native coronary artery without angina pectoris: Secondary | ICD-10-CM | POA: Diagnosis not present

## 2017-08-20 DIAGNOSIS — I495 Sick sinus syndrome: Secondary | ICD-10-CM | POA: Diagnosis present

## 2017-08-20 DIAGNOSIS — N184 Chronic kidney disease, stage 4 (severe): Secondary | ICD-10-CM | POA: Diagnosis present

## 2017-08-20 DIAGNOSIS — I129 Hypertensive chronic kidney disease with stage 1 through stage 4 chronic kidney disease, or unspecified chronic kidney disease: Secondary | ICD-10-CM | POA: Diagnosis present

## 2017-08-20 DIAGNOSIS — E875 Hyperkalemia: Secondary | ICD-10-CM | POA: Diagnosis present

## 2017-08-20 DIAGNOSIS — R001 Bradycardia, unspecified: Secondary | ICD-10-CM | POA: Diagnosis not present

## 2017-08-20 DIAGNOSIS — N179 Acute kidney failure, unspecified: Secondary | ICD-10-CM | POA: Diagnosis present

## 2017-08-20 DIAGNOSIS — Z7902 Long term (current) use of antithrombotics/antiplatelets: Secondary | ICD-10-CM | POA: Diagnosis not present

## 2017-08-21 ENCOUNTER — Inpatient Hospital Stay (HOSPITAL_COMMUNITY)
Admission: EM | Admit: 2017-08-21 | Discharge: 2017-08-23 | DRG: 244 | Disposition: A | Discharge status: Home / Self Care | Payer: Medicare Other | Source: Other Acute Inpatient Hospital | Attending: Cardiology | Admitting: Cardiology

## 2017-08-21 ENCOUNTER — Encounter (HOSPITAL_COMMUNITY): Payer: Self-pay

## 2017-08-21 ENCOUNTER — Encounter: Payer: Self-pay | Admitting: Cardiology

## 2017-08-21 DIAGNOSIS — E785 Hyperlipidemia, unspecified: Secondary | ICD-10-CM | POA: Diagnosis not present

## 2017-08-21 DIAGNOSIS — I495 Sick sinus syndrome: Principal | ICD-10-CM | POA: Diagnosis present

## 2017-08-21 DIAGNOSIS — Z951 Presence of aortocoronary bypass graft: Secondary | ICD-10-CM

## 2017-08-21 DIAGNOSIS — Z6834 Body mass index (BMI) 34.0-34.9, adult: Secondary | ICD-10-CM | POA: Diagnosis not present

## 2017-08-21 DIAGNOSIS — Z7901 Long term (current) use of anticoagulants: Secondary | ICD-10-CM | POA: Diagnosis not present

## 2017-08-21 DIAGNOSIS — Z794 Long term (current) use of insulin: Secondary | ICD-10-CM | POA: Diagnosis not present

## 2017-08-21 DIAGNOSIS — E782 Mixed hyperlipidemia: Secondary | ICD-10-CM | POA: Diagnosis not present

## 2017-08-21 DIAGNOSIS — Z23 Encounter for immunization: Secondary | ICD-10-CM

## 2017-08-21 DIAGNOSIS — Z9049 Acquired absence of other specified parts of digestive tract: Secondary | ICD-10-CM

## 2017-08-21 DIAGNOSIS — Z881 Allergy status to other antibiotic agents status: Secondary | ICD-10-CM | POA: Diagnosis not present

## 2017-08-21 DIAGNOSIS — R001 Bradycardia, unspecified: Secondary | ICD-10-CM | POA: Diagnosis not present

## 2017-08-21 DIAGNOSIS — I4892 Unspecified atrial flutter: Secondary | ICD-10-CM | POA: Diagnosis not present

## 2017-08-21 DIAGNOSIS — I480 Paroxysmal atrial fibrillation: Secondary | ICD-10-CM | POA: Diagnosis not present

## 2017-08-21 DIAGNOSIS — N189 Chronic kidney disease, unspecified: Secondary | ICD-10-CM | POA: Diagnosis present

## 2017-08-21 DIAGNOSIS — Z823 Family history of stroke: Secondary | ICD-10-CM | POA: Diagnosis not present

## 2017-08-21 DIAGNOSIS — J449 Chronic obstructive pulmonary disease, unspecified: Secondary | ICD-10-CM | POA: Diagnosis present

## 2017-08-21 DIAGNOSIS — Z8711 Personal history of peptic ulcer disease: Secondary | ICD-10-CM

## 2017-08-21 DIAGNOSIS — E1122 Type 2 diabetes mellitus with diabetic chronic kidney disease: Secondary | ICD-10-CM | POA: Diagnosis present

## 2017-08-21 DIAGNOSIS — E669 Obesity, unspecified: Secondary | ICD-10-CM | POA: Diagnosis present

## 2017-08-21 DIAGNOSIS — I4891 Unspecified atrial fibrillation: Secondary | ICD-10-CM | POA: Diagnosis not present

## 2017-08-21 DIAGNOSIS — Z95818 Presence of other cardiac implants and grafts: Secondary | ICD-10-CM

## 2017-08-21 DIAGNOSIS — Z79899 Other long term (current) drug therapy: Secondary | ICD-10-CM | POA: Diagnosis not present

## 2017-08-21 DIAGNOSIS — E1159 Type 2 diabetes mellitus with other circulatory complications: Secondary | ICD-10-CM | POA: Diagnosis not present

## 2017-08-21 DIAGNOSIS — G4733 Obstructive sleep apnea (adult) (pediatric): Secondary | ICD-10-CM | POA: Diagnosis present

## 2017-08-21 DIAGNOSIS — Z8249 Family history of ischemic heart disease and other diseases of the circulatory system: Secondary | ICD-10-CM

## 2017-08-21 DIAGNOSIS — Z87891 Personal history of nicotine dependence: Secondary | ICD-10-CM

## 2017-08-21 DIAGNOSIS — I129 Hypertensive chronic kidney disease with stage 1 through stage 4 chronic kidney disease, or unspecified chronic kidney disease: Secondary | ICD-10-CM | POA: Diagnosis not present

## 2017-08-21 DIAGNOSIS — Z800 Family history of malignant neoplasm of digestive organs: Secondary | ICD-10-CM

## 2017-08-21 DIAGNOSIS — I2510 Atherosclerotic heart disease of native coronary artery without angina pectoris: Secondary | ICD-10-CM | POA: Diagnosis not present

## 2017-08-21 DIAGNOSIS — R918 Other nonspecific abnormal finding of lung field: Secondary | ICD-10-CM | POA: Diagnosis not present

## 2017-08-21 DIAGNOSIS — Z8051 Family history of malignant neoplasm of kidney: Secondary | ICD-10-CM | POA: Diagnosis not present

## 2017-08-21 DIAGNOSIS — Z825 Family history of asthma and other chronic lower respiratory diseases: Secondary | ICD-10-CM | POA: Diagnosis not present

## 2017-08-21 DIAGNOSIS — Z45018 Encounter for adjustment and management of other part of cardiac pacemaker: Secondary | ICD-10-CM | POA: Diagnosis not present

## 2017-08-21 DIAGNOSIS — I272 Pulmonary hypertension, unspecified: Secondary | ICD-10-CM | POA: Diagnosis not present

## 2017-08-21 HISTORY — DX: Bradycardia, unspecified: R00.1

## 2017-08-21 MED ORDER — ASPIRIN 300 MG RE SUPP: 300.0000 mg | RECTAL | Status: AC

## 2017-08-21 MED ORDER — DOPAMINE-DEXTROSE 3.2-5 MG/ML-% IV SOLN
2.0000 ug/kg/min | INTRAVENOUS | Status: DC
  Administered 2017-08-22: 5 ug/kg/min via INTRAVENOUS
  Filled 2017-08-21: qty 250

## 2017-08-21 MED ORDER — FEBUXOSTAT 40 MG PO TABS
40.0000 mg | ORAL_TABLET | Freq: Every day | ORAL | Status: DC
  Administered 2017-08-22 – 2017-08-23 (×2): 40 mg via ORAL
  Filled 2017-08-21 (×2): qty 1

## 2017-08-21 MED ORDER — GLIMEPIRIDE 4 MG PO TABS
4.0000 mg | ORAL_TABLET | Freq: Two times a day (BID) | ORAL | Status: DC
  Administered 2017-08-22 – 2017-08-23 (×3): 4 mg via ORAL
  Filled 2017-08-21 (×4): qty 1

## 2017-08-21 MED ORDER — ASPIRIN 81 MG PO CHEW
324.0000 mg | CHEWABLE_TABLET | ORAL | Status: AC
  Administered 2017-08-22: 324 mg via ORAL
  Filled 2017-08-21: qty 4

## 2017-08-21 MED ORDER — POTASSIUM CHLORIDE CRYS ER 20 MEQ PO TBCR
20.0000 meq | EXTENDED_RELEASE_TABLET | Freq: Every day | ORAL | Status: DC
  Administered 2017-08-22 – 2017-08-23 (×2): 20 meq via ORAL
  Filled 2017-08-21 (×2): qty 1

## 2017-08-21 MED ORDER — ONDANSETRON HCL 4 MG/2ML IJ SOLN: 4.0000 mg | Freq: Four times a day (QID) | INTRAMUSCULAR | Status: DC | PRN

## 2017-08-21 MED ORDER — APIXABAN 5 MG PO TABS
5.0000 mg | ORAL_TABLET | Freq: Two times a day (BID) | ORAL | Status: DC
  Administered 2017-08-22 – 2017-08-23 (×3): 5 mg via ORAL
  Filled 2017-08-21 (×3): qty 1

## 2017-08-21 MED ORDER — ACETAMINOPHEN 325 MG PO TABS
650.0000 mg | ORAL_TABLET | ORAL | Status: DC | PRN
  Administered 2017-08-22: 650 mg via ORAL
  Filled 2017-08-21: qty 2

## 2017-08-21 MED ORDER — FUROSEMIDE 20 MG PO TABS
20.0000 mg | ORAL_TABLET | Freq: Every day | ORAL | Status: DC
  Administered 2017-08-22 – 2017-08-23 (×2): 20 mg via ORAL
  Filled 2017-08-21 (×2): qty 1

## 2017-08-21 MED ORDER — PANTOPRAZOLE SODIUM 40 MG PO TBEC
40.0000 mg | DELAYED_RELEASE_TABLET | Freq: Every day | ORAL | Status: DC
  Administered 2017-08-22 – 2017-08-23 (×2): 40 mg via ORAL
  Filled 2017-08-21 (×2): qty 1

## 2017-08-21 MED ORDER — NITROGLYCERIN 0.4 MG SL SUBL: 0.4000 mg | SUBLINGUAL_TABLET | SUBLINGUAL | Status: DC | PRN

## 2017-08-21 MED ORDER — GABAPENTIN 300 MG PO CAPS
300.0000 mg | ORAL_CAPSULE | Freq: Three times a day (TID) | ORAL | Status: DC
  Administered 2017-08-22 – 2017-08-23 (×4): 300 mg via ORAL
  Filled 2017-08-21 (×4): qty 1

## 2017-08-21 MED ORDER — SIMVASTATIN 20 MG PO TABS
20.0000 mg | ORAL_TABLET | Freq: Every day | ORAL | Status: DC
  Administered 2017-08-22: 19:00:00 20 mg via ORAL
  Filled 2017-08-21: qty 1

## 2017-08-21 MED ORDER — INSULIN ASPART 100 UNIT/ML ~~LOC~~ SOLN
0.0000 [IU] | Freq: Three times a day (TID) | SUBCUTANEOUS | Status: DC
  Administered 2017-08-22: 3 [IU] via SUBCUTANEOUS
  Administered 2017-08-22: 19:00:00 2 [IU] via SUBCUTANEOUS
  Administered 2017-08-22 – 2017-08-23 (×2): 3 [IU] via SUBCUTANEOUS

## 2017-08-21 MED ORDER — LINAGLIPTIN 5 MG PO TABS
5.0000 mg | ORAL_TABLET | Freq: Every day | ORAL | Status: DC
  Administered 2017-08-22 – 2017-08-23 (×2): 5 mg via ORAL
  Filled 2017-08-21 (×2): qty 1

## 2017-08-21 MED ORDER — ASPIRIN EC 81 MG PO TBEC
81.0000 mg | DELAYED_RELEASE_TABLET | Freq: Every day | ORAL | Status: DC
  Administered 2017-08-22 – 2017-08-23 (×2): 81 mg via ORAL
  Filled 2017-08-21 (×2): qty 1

## 2017-08-21 MED ORDER — ACETAMINOPHEN 325 MG PO TABS: 650.0000 mg | ORAL_TABLET | ORAL | Status: DC | PRN

## 2017-08-21 MED ORDER — IPRATROPIUM-ALBUTEROL 0.5-2.5 (3) MG/3ML IN SOLN: 3.0000 mL | Freq: Every day | RESPIRATORY_TRACT | Status: DC | PRN

## 2017-08-21 NOTE — Addendum Note (Signed)
Addended by: Kathyrn Sheriff on: 08/21/2017 10:41 AM   Modules accepted: Orders

## 2017-08-22 ENCOUNTER — Encounter (HOSPITAL_COMMUNITY)
Admission: EM | Disposition: A | Discharge status: Home / Self Care | Payer: Self-pay | Source: Other Acute Inpatient Hospital | Attending: Interventional Cardiology

## 2017-08-22 DIAGNOSIS — I495 Sick sinus syndrome: Secondary | ICD-10-CM

## 2017-08-22 DIAGNOSIS — I480 Paroxysmal atrial fibrillation: Secondary | ICD-10-CM

## 2017-08-22 HISTORY — PX: PACEMAKER IMPLANT: EP1218

## 2017-08-22 LAB — CBC
HEMATOCRIT: 34.3 % — AB (ref 39.0–52.0)
Hemoglobin: 11.2 g/dL — ABNORMAL LOW (ref 13.0–17.0)
MCH: 31.4 pg (ref 26.0–34.0)
MCHC: 32.7 g/dL (ref 30.0–36.0)
MCV: 96.1 fL (ref 78.0–100.0)
Platelets: 158 10*3/uL (ref 150–400)
RBC: 3.57 MIL/uL — ABNORMAL LOW (ref 4.22–5.81)
RDW: 14.3 % (ref 11.5–15.5)
WBC: 13.3 10*3/uL — AB (ref 4.0–10.5)

## 2017-08-22 LAB — BASIC METABOLIC PANEL
ANION GAP: 8 (ref 5–15)
BUN: 52 mg/dL — AB (ref 6–20)
CO2: 25 mmol/L (ref 22–32)
Calcium: 8.4 mg/dL — ABNORMAL LOW (ref 8.9–10.3)
Chloride: 102 mmol/L (ref 101–111)
Creatinine, Ser: 2.07 mg/dL — ABNORMAL HIGH (ref 0.61–1.24)
GFR calc Af Amer: 35 mL/min — ABNORMAL LOW (ref >60)
GFR, EST NON AFRICAN AMERICAN: 31 mL/min — AB (ref >60)
Glucose, Bld: 172 mg/dL — ABNORMAL HIGH (ref 65–99)
POTASSIUM: 4.1 mmol/L (ref 3.5–5.1)
SODIUM: 135 mmol/L (ref 135–145)

## 2017-08-22 LAB — MRSA PCR SCREENING: MRSA BY PCR: POSITIVE — AB

## 2017-08-22 LAB — TSH: TSH: 0.585 u[IU]/mL (ref 0.350–4.500)

## 2017-08-22 LAB — GLUCOSE, CAPILLARY
GLUCOSE-CAPILLARY: 158 mg/dL — AB (ref 65–99)
GLUCOSE-CAPILLARY: 179 mg/dL — AB (ref 65–99)
Glucose-Capillary: 167 mg/dL — ABNORMAL HIGH (ref 65–99)

## 2017-08-22 SURGERY — PACEMAKER IMPLANT

## 2017-08-22 MED ORDER — SODIUM CHLORIDE 0.9 % IR SOLN
80.0000 mg | Status: AC
  Administered 2017-08-22: 80 mg
  Filled 2017-08-22: qty 2

## 2017-08-22 MED ORDER — SODIUM CHLORIDE 0.9 % IR SOLN
Status: AC
  Filled 2017-08-22: qty 2

## 2017-08-22 MED ORDER — CEFAZOLIN SODIUM-DEXTROSE 2-4 GM/100ML-% IV SOLN
2.0000 g | INTRAVENOUS | Status: AC
  Administered 2017-08-22: 2 g via INTRAVENOUS
  Filled 2017-08-22: qty 100

## 2017-08-22 MED ORDER — MIDAZOLAM HCL 5 MG/5ML IJ SOLN
INTRAMUSCULAR | Status: AC
  Filled 2017-08-22: qty 5

## 2017-08-22 MED ORDER — CHLORHEXIDINE GLUCONATE 4 % EX LIQD: 60.0000 mL | Freq: Once | CUTANEOUS | Status: DC

## 2017-08-22 MED ORDER — CHLORHEXIDINE GLUCONATE 4 % EX LIQD
CUTANEOUS | Status: AC
  Filled 2017-08-22: qty 15

## 2017-08-22 MED ORDER — LIDOCAINE HCL (PF) 1 % IJ SOLN
INTRAMUSCULAR | Status: AC
  Filled 2017-08-22: qty 30

## 2017-08-22 MED ORDER — HEPARIN (PORCINE) IN NACL 2-0.9 UNIT/ML-% IJ SOLN
INTRAMUSCULAR | Status: AC
  Filled 2017-08-22: qty 500

## 2017-08-22 MED ORDER — MIDAZOLAM HCL 5 MG/5ML IJ SOLN
INTRAMUSCULAR | Status: DC | PRN
  Administered 2017-08-22 (×2): 1 mg via INTRAVENOUS

## 2017-08-22 MED ORDER — ATROPINE SULFATE 1 MG/10ML IJ SOSY
PREFILLED_SYRINGE | INTRAMUSCULAR | Status: AC
  Filled 2017-08-22: qty 10

## 2017-08-22 MED ORDER — SODIUM CHLORIDE 0.9 % IV SOLN: INTRAVENOUS | Status: DC

## 2017-08-22 MED ORDER — ONDANSETRON HCL 4 MG/2ML IJ SOLN: 4.0000 mg | Freq: Four times a day (QID) | INTRAMUSCULAR | Status: DC | PRN

## 2017-08-22 MED ORDER — SODIUM CHLORIDE 0.9% FLUSH
3.0000 mL | Freq: Two times a day (BID) | INTRAVENOUS | Status: DC
  Administered 2017-08-22: 19:00:00 3 mL via INTRAVENOUS

## 2017-08-22 MED ORDER — ACETAMINOPHEN 325 MG PO TABS
325.0000 mg | ORAL_TABLET | ORAL | Status: DC | PRN
  Administered 2017-08-22: 21:00:00 650 mg via ORAL
  Filled 2017-08-22: qty 2

## 2017-08-22 MED ORDER — PNEUMOCOCCAL VAC POLYVALENT 25 MCG/0.5ML IJ INJ
0.5000 mL | INJECTION | INTRAMUSCULAR | Status: AC
  Administered 2017-08-23: 10:00:00 0.5 mL via INTRAMUSCULAR
  Filled 2017-08-22: qty 0.5

## 2017-08-22 MED ORDER — LIDOCAINE HCL (PF) 1 % IJ SOLN
INTRAMUSCULAR | Status: DC | PRN
  Administered 2017-08-22: 45 mL

## 2017-08-22 MED ORDER — MAGNESIUM HYDROXIDE 400 MG/5ML PO SUSP
30.0000 mL | Freq: Every day | ORAL | Status: DC | PRN
  Administered 2017-08-22: 30 mL via ORAL
  Filled 2017-08-22 (×2): qty 30

## 2017-08-22 MED ORDER — FENTANYL CITRATE (PF) 100 MCG/2ML IJ SOLN
INTRAMUSCULAR | Status: DC | PRN
  Administered 2017-08-22: 25 ug via INTRAVENOUS

## 2017-08-22 MED ORDER — SODIUM CHLORIDE 0.9 % IV SOLN
INTRAVENOUS | Status: DC
  Administered 2017-08-22: 16:00:00 via INTRAVENOUS

## 2017-08-22 MED ORDER — MUPIROCIN 2 % EX OINT
1.0000 "application " | TOPICAL_OINTMENT | Freq: Two times a day (BID) | CUTANEOUS | Status: DC
  Administered 2017-08-22 – 2017-08-23 (×4): 1 via NASAL
  Filled 2017-08-22 (×2): qty 22

## 2017-08-22 MED ORDER — HEPARIN (PORCINE) IN NACL 2-0.9 UNIT/ML-% IJ SOLN
INTRAMUSCULAR | Status: AC | PRN
  Administered 2017-08-22: 500 mL

## 2017-08-22 MED ORDER — CEFAZOLIN SODIUM-DEXTROSE 1-4 GM/50ML-% IV SOLN
1.0000 g | Freq: Four times a day (QID) | INTRAVENOUS | Status: AC
  Administered 2017-08-22 – 2017-08-23 (×3): 1 g via INTRAVENOUS
  Filled 2017-08-22 (×4): qty 50

## 2017-08-22 MED ORDER — CHLORHEXIDINE GLUCONATE CLOTH 2 % EX PADS
6.0000 | MEDICATED_PAD | Freq: Every day | CUTANEOUS | Status: DC
  Administered 2017-08-22 – 2017-08-23 (×2): 6 via TOPICAL

## 2017-08-22 MED ORDER — YOU HAVE A PACEMAKER BOOK
Freq: Once | Status: AC
  Administered 2017-08-22: 20:00:00
  Filled 2017-08-22: qty 1

## 2017-08-22 MED ORDER — SODIUM CHLORIDE 0.9% FLUSH: 3.0000 mL | INTRAVENOUS | Status: DC | PRN

## 2017-08-22 MED ORDER — FENTANYL CITRATE (PF) 100 MCG/2ML IJ SOLN
INTRAMUSCULAR | Status: AC
  Filled 2017-08-22: qty 2

## 2017-08-22 MED ORDER — ORAL CARE MOUTH RINSE
15.0000 mL | Freq: Two times a day (BID) | OROMUCOSAL | Status: DC
  Administered 2017-08-22 – 2017-08-23 (×2): 15 mL via OROMUCOSAL

## 2017-08-22 MED ORDER — CEFAZOLIN SODIUM-DEXTROSE 2-4 GM/100ML-% IV SOLN
INTRAVENOUS | Status: AC
  Filled 2017-08-22: qty 100

## 2017-08-22 MED ORDER — SODIUM CHLORIDE 0.9 % IV SOLN: 250.0000 mL | INTRAVENOUS | Status: DC

## 2017-08-22 SURGICAL SUPPLY — 7 items
CABLE SURGICAL S-101-97-12 (CABLE) ×2 IMPLANT
LEAD TENDRIL MRI 52CM LPA1200M (Lead) ×2 IMPLANT
LEAD TENDRIL MRI 58CM LPA1200M (Lead) ×2 IMPLANT
PACEMAKER ASSURITY DR-RF (Pacemaker) ×2 IMPLANT
PAD DEFIB LIFELINK (PAD) ×2 IMPLANT
SHEATH CLASSIC 8F (SHEATH) ×4 IMPLANT
TRAY PACEMAKER INSERTION (PACKS) ×2 IMPLANT

## 2017-08-22 NOTE — Progress Notes (Signed)
Dopamine stopped per MD order. Pt up on side of bed. HR still in 40's. Will continue to monitor.  Lawrence [name]

## 2017-08-22 NOTE — Progress Notes (Signed)
Lawrence [name] presented to the hospital with fatigue, found to be in sinus bradycardia. Bystolic has since washed out. Remains bradycardic. Plan for pacemaker implant. Risks and benefits discussed. Risks include but not limited to bleeding, infection, tamponade, pneumothorax. The patient understands the risks and has agreed to the procedure.  Will Curt Bears, MD 08/22/2017 3:34 PM

## 2017-08-22 NOTE — Care Management Note (Signed)
Case Management Note Marvetta Gibbons RN, BSN Unit 4E-Case Manager-- [place] coverage [phone]  Patient Details  Name: Lawrence [name] MRN: [id number] Date of Birth: 02-27-1946  Subjective/Objective:  Pt s/p cardioversion yesterday. Today, was found to have a heart rate in the 30s. started on dopamine - may need PPM- will have EP to consult.                  Action/Plan: PTA pt lived at home with wife- independent- anticipate return home- CM to follow for d/c needs.   Expected Discharge Date:                  Expected Discharge Plan:  Home/Self Care  In-House Referral:     Discharge planning Services  CM Consult  Post Acute Care Choice:    Choice offered to:     DME Arranged:    DME Agency:     HH Arranged:    HH Agency:     Status of Service:  In process, will continue to follow  If discussed at Long Length of Stay Meetings, dates discussed:    Discharge Disposition:   Additional Comments:  Dawayne Patricia, RN 08/22/2017, 10:29 AM

## 2017-08-22 NOTE — Progress Notes (Signed)
Dopamine turned off at 1357. Ambulated patient with assistance 190 ft on 5 liters [place] with walker. HR got up to 56. HR at this time sitting up in the chair is 43. BP 144/60. 98% on 4 L [place]. Pt states he is" wobbly from being in the bed for several days." Will continue to monitor.   Lucius Conn, RN

## 2017-08-22 NOTE — Consult Note (Signed)
Cardiology Consultation:   Patient ID: Lawrence [name]; [id number]; 07/03/1946   Admit date: 08/21/2017 Date of Consult: 08/22/2017  Primary Care Provider: [name], MD Primary Cardiologist: Dr. [name] Primary Electrophysiologist:  none   Patient Profile:   Lawrence [name] is a 71 y.o. male with a hx of CAD, paroxysmal AFib, DM, HTN, HLD, morbid obesity, CRI, OSA/CPAP  who is being seen today for the evaluation of bradycardia at the request of Dr. Irish Lack.  History of Present Illness:   Lawrence [name] is followed out patient by Dr. [name] and planned for DCCV which he underwent 08/20/17 at [facility] by chart after which was bradycardic in the 30's admitted and placed on dopamine for HR support.   His Bystolic and amiodarone were held though remained brady when tried to wean dopa.  His Eliquis has been continued.  At rest the patient denies symptoms, c/o constipation only.  No CP, palpitations.  Patient had an echo done Jan 2017, I can not access the result, 03/13/16 note Dr. [name] remarks echocardiogram showed preserved EF and stress no evidence of ischemia.  Echo at [facility] noted LVEF 55%  LABS K+ 5.6 >> 4.1 BUN/Creat 53/2.40 >> 52/2.07 WBC 15.3 >> 13.3 H/H 12/37 plts 158   Home meds:  on amiodarone, as well as Bystolic Last dose of Bystolic was Monday morning (08/19/17) 1/2 life of bystolic 12 hours  Past Medical History:  Diagnosis Date  . Anemia   . Chronic airway obstruction, not elsewhere classified   . Chronic ischemic heart disease, unspecified   . Coronary artery disease   . Diabetes mellitus    type II, neuropathy,   . Diabetes mellitus without complication ([place])   . Essential hypertension, benign   . Hyperlipidemia   . Hyperlipidemia, mixed   . Hypertension   . Nodule of kidney    incidentally found on CT abdomen 11/2012.  Pending Urology evaluation.   . Nontoxic multinodular goiter   . Obesity   . Other testicular hypofunction     . PUD (peptic ulcer disease)   . Renal insufficiency, mild    hx of    Past Surgical History:  Procedure Laterality Date  . CHOLECYSTECTOMY    . CORONARY ARTERY BYPASS GRAFT  2007   x 4  . CORONARY ARTERY BYPASS GRAFT    . ESOPHAGOGASTRODUODENOSCOPY     10/06/2012:  hiatal hernia, moderate gastritis.  2012: Colonoscopy at [facility]:  one polyp.  Due next 2017  . EYE SURGERY    . KNEE DEBRIDEMENT    . SHOULDER SURGERY     left shoulder  . SHOULDER SURGERY    . TONSILECTOMY, ADENOIDECTOMY, BILATERAL MYRINGOTOMY AND TUBES         Inpatient Medications: Scheduled Meds: . apixaban  5 mg Oral BID  . aspirin EC  81 mg Oral Daily  . Chlorhexidine Gluconate Cloth  6 each Topical Q0600  . febuxostat  40 mg Oral Daily  . furosemide  20 mg Oral Daily  . gabapentin  300 mg Oral TID  . glimepiride  4 mg Oral BID WC  . insulin aspart  0-15 Units Subcutaneous TID WC  . linagliptin  5 mg Oral Daily  . mouth rinse  15 mL Mouth Rinse BID  . mupirocin ointment  1 application Nasal BID  . pantoprazole  40 mg Oral Daily  . [START ON 08/23/2017] pneumococcal 23 valent vaccine  0.5 mL Intramuscular Tomorrow-1000  . potassium chloride SA  20 mEq Oral Daily  . simvastatin  20 mg Oral q1800   Continuous Infusions: . DOPamine 3.009 mcg/kg/min (08/22/17 1000)   PRN Meds: acetaminophen, ipratropium-albuterol, magnesium hydroxide, nitroGLYCERIN, ondansetron (ZOFRAN) IV  Allergies:    Allergies  Allergen Reactions  . Bactrim [Sulfamethoxazole-Trimethoprim]     Social History:   Social History   Social History  . Marital status: Married    Spouse name: N/A  . Number of children: 5  . Years of education: N/A   Occupational History  . Retired     Medical illustrator; retired Chiropodist   Social History Main Topics  . Smoking status: Former Smoker    Packs/day: 1.50    Years: 30.00    Types: Cigarettes  . Smokeless tobacco: Former Systems developer    Types: Chew  . Alcohol use Yes  . Drug  use: No  . Sexual activity: Not on file   Other Topics Concern  . Not on file   Social History Narrative   ** Merged History Encounter **       Lives with wife in a one story home.  Has 6 children.  Retired Nature conservation officer.  Education: college.    Family History:    Family History  Problem Relation Age of Onset  . Colon cancer Mother   . Hypertension Mother   . Kidney cancer Mother   . Cancer Mother        colon  . Esophageal cancer Father   . Stroke Father   . Cancer Father        esophageal  . Asthma Sister   . Emphysema Unknown      ROS:  Please see the history of present illness.  ROS  All other ROS reviewed and negative.     Physical Exam/Data:   Vitals:   08/22/17 0945 08/22/17 1000 08/22/17 1015 08/22/17 1030  BP: (!) 145/57 (!) 139/53 (!) 143/58 (!) 141/51  Pulse: (!) 48 (!) 46 (!) 48 (!) 48  Resp: 16 18 19  (!) 24  Temp:      TempSrc:      SpO2: 91% 99% 93% 96%  Weight:      Height:        Intake/Output Summary (Last 24 hours) at 08/22/17 1037 Last data filed at 08/22/17 1000  Gross per 24 hour  Intake              368 ml  Output              300 ml  Net               68 ml   Filed Weights   08/22/17 0000  Weight: 254 lb (115.2 kg)   Body mass index is 34.45 kg/m.  General:  Well nourished, well developed, in no acute distress HEENT: normal Lymph: no adenopathy Neck: no JVD Endocrine:  No thryomegaly Vascular: No carotid bruits Cardiac:  RRR; no murmurs, gallops or rubs Lungs:  CTA b/l, no wheezing, rhonchi or rales  Abd: soft, nontender, obese Ext: no edema Musculoskeletal:  No deformities, BUE and BLE strength normal and equal Skin: warm and dry  Neuro:  no focal abnormalities noted Psych:  Normal affect   EKG:  The EKG was personally reviewed and demonstrates:   08/20/17 SB 54bpm, 1st degree AVblock 220m, QRS 984mPost shock single lead strip is SB 38bpm Here on dopamine EKG yesterday was SB 54bpm, [place] 22412mQRS 110m22mlemetry:   Telemetry was personally  reviewed and demonstrates:   SB 50's on dopamine this AM  Laboratory Data:  Chemistry Recent Labs Lab 08/22/17 0435  NA 135  K 4.1  CL 102  CO2 25  GLUCOSE 172*  BUN 52*  CREATININE 2.07*  CALCIUM 8.4*  GFRNONAA 31*  GFRAA 35*  ANIONGAP 8    No results for input(s): PROT, ALBUMIN, AST, ALT, ALKPHOS, BILITOT in the last 168 hours. Hematology Recent Labs Lab 08/22/17 0435  WBC 13.3*  RBC 3.57*  HGB 11.2*  HCT 34.3*  MCV 96.1  MCH 31.4  MCHC 32.7  RDW 14.3  PLT 158   Cardiac EnzymesNo results for input(s): TROPONINI in the last 168 hours. No results for input(s): TROPIPOC in the last 168 hours.  BNPNo results for input(s): BNP, PROBNP in the last 168 hours.  DDimer No results for input(s): DDIMER in the last 168 hours.  Radiology/Studies:  No results found.  Assessment and Plan:   1. Bradycardia     Bethsaida Siegenthaler try to wean off dopamine as Bystolic Frantz Quattrone wash out this afternoon     At rest he is asymptomatic, BP is good      May need pacing, Jane Birkel re-evaluate HR off dopamine (if able to wean)  2. Paroxysmal AFib     CHA2DS2Vasc is at least 4, on Eliquis  3. CAD     No anginal complaints     On ASA, statin  4. HTN     No changes   Signed, Baldwin Jamaica, PA-C  08/22/2017 10:37 AM  I have seen and examined this patient with Tommye Standard.  Agree with above, note added to reflect my findings.  On exam, RRR, no murmurs, lungs clear. Had cardioversion and is now bradycardic. On dopamine. Ghassan Coggeshall attempt to wean dopa and if HR not increased, Cindi Ghazarian plan for pacemaker.    Lezley Bedgood M. Lundon Verdejo MD 08/22/2017 12:00 PM

## 2017-08-22 NOTE — H&P (Addendum)
Admit date: 08/21/2017 Referring Physician Dr. [name] Primary Cardiologist Dr. [name] Chief complaint/reason for admission: Bradycardia  HPI: 71 year old man with a history of atrial fibrillation managed with amiodarone. He underwent cardioversion yesterday. Today, he was found to have a heart rate in the 30s. He denies any syncope. He did feel weak at times but no clear lightheadedness.  He was started on dopamine and the plan was made to transfer him here. He awaited a stepdown bed. Apparently, they tried to wean the dopamine and his heart rate would drop into the 30s. His blood pressure was stable.  Of note, he has been on amiodarone and beta blocker. He feels well. He is most upset that he cannot walk to the bathroom and that he has to stay on bed rest per instructions from the nurse.  No chest pain or shortness of breath.    PMH:    Past Medical History:  Diagnosis Date  . Anemia   . Chronic airway obstruction, not elsewhere classified   . Chronic ischemic heart disease, unspecified   . Coronary artery disease   . Diabetes mellitus    type II, neuropathy,   . Diabetes mellitus without complication ([place])   . Essential hypertension, benign   . Hyperlipidemia   . Hyperlipidemia, mixed   . Hypertension   . Nodule of kidney    incidentally found on CT abdomen 11/2012.  Pending Urology evaluation.   . Nontoxic multinodular goiter   . Obesity   . Other testicular hypofunction   . PUD (peptic ulcer disease)   . Renal insufficiency, mild    hx of    PSH:    Past Surgical History:  Procedure Laterality Date  . CHOLECYSTECTOMY    . CORONARY ARTERY BYPASS GRAFT  2007   x 4  . CORONARY ARTERY BYPASS GRAFT    . ESOPHAGOGASTRODUODENOSCOPY     10/06/2012:  hiatal hernia, moderate gastritis.  2012: Colonoscopy at [facility]:  one polyp.  Due next 2017  . EYE SURGERY    . KNEE DEBRIDEMENT    . SHOULDER SURGERY     left shoulder  . SHOULDER SURGERY    . TONSILECTOMY,  ADENOIDECTOMY, BILATERAL MYRINGOTOMY AND TUBES      ALLERGIES:   Bactrim [sulfamethoxazole-trimethoprim]  Prior to Admit Meds:   Prescriptions Prior to Admission  Medication Sig Dispense Refill Last Dose  . amiodarone (PACERONE) 400 MG tablet Take 1 tablet (400 mg total) by mouth 2 (two) times daily. 60 tablet 1   . apixaban (ELIQUIS) 5 MG TABS tablet Take 5 mg by mouth 2 (two) times daily.   Taking  . febuxostat (ULORIC) 40 MG tablet Take 40 mg by mouth daily.   Taking  . furosemide (LASIX) 20 MG tablet Take 20 mg by mouth daily.   Taking  . gabapentin (NEURONTIN) 300 MG capsule Take 300 mg by mouth 3 (three) times daily.   Taking  . glimepiride (AMARYL) 4 MG tablet Take 4 mg by mouth 2 (two) times daily.    Taking  . Ipratropium-Albuterol (COMBIVENT RESPIMAT) 20-100 MCG/ACT AERS respimat Inhale 1 puff into the lungs daily as needed for wheezing.   Taking  . ipratropium-albuterol (DUONEB) 0.5-2.5 (3) MG/3ML SOLN Take 3 mLs by nebulization as needed.   Taking  . linagliptin (TRADJENTA) 5 MG TABS tablet Take 5 mg by mouth daily.   Taking  . liraglutide (VICTOZA) 18 MG/3ML SOPN Inject 1.8 mg into the skin.   Taking  .  Nebivolol HCl 20 MG TABS Take 0.5 tablets (10 mg total) by mouth daily. 90 tablet 3 Taking  . nitroGLYCERIN (NITROSTAT) 0.4 MG SL tablet Place 0.4 mg under the tongue.   Taking  . pantoprazole (PROTONIX) 40 MG tablet Take 40 mg by mouth.   Taking  . potassium chloride SA (K-DUR,KLOR-CON) 20 MEQ tablet Take by mouth.   Taking  . simvastatin (ZOCOR) 20 MG tablet Take 20 mg by mouth.   Taking   Family HX:    Family History  Problem Relation Age of Onset  . Colon cancer Mother   . Hypertension Mother   . Kidney cancer Mother   . Cancer Mother        colon  . Esophageal cancer Father   . Stroke Father   . Cancer Father        esophageal  . Asthma Sister   . Emphysema Unknown    Social HX:    Social History   Social History  . Marital status: Married    Spouse name:  N/A  . Number of children: 5  . Years of education: N/A   Occupational History  . Retired     Medical illustrator; retired Chiropodist   Social History Main Topics  . Smoking status: Former Smoker    Packs/day: 1.50    Years: 30.00    Types: Cigarettes  . Smokeless tobacco: Former Systems developer    Types: Chew  . Alcohol use Yes  . Drug use: No  . Sexual activity: Not on file   Other Topics Concern  . Not on file   Social History Narrative   ** Merged History Encounter **       Lives with wife in a one story home.  Has 6 children.  Retired Nature conservation officer.  Education: college.     ROS:  All 11 ROS were addressed and are negative except what is stated in the HPI  PHYSICAL EXAM Vitals:   08/21/17 2238 08/21/17 2300  BP: (!) 144/62 (!) 144/80  Pulse: (!) 45 (!) 48  Resp: 15 (!) 22  Temp: 98.8 F (37.1 C)   SpO2: 100% 98%   General: Well developed, well nourished, in no acute distress Head: Eyes PERRLA, No xanthomas.   Normal cephalic and atramatic  Lungs:   Clear bilaterally to auscultation and percussion. Heart:  Bradycardic S1 S2 Pulses are 2+ & equal.            No carotid bruit. No JVD.  No abdominal bruits. No femoral bruits. Abdomen: Bowel sounds are positive, abdomen soft and non-tender without masses or                  Hernia's noted. Msk:  Back normal, normal gait. Normal strength and tone for age. Extremities:   No clubbing, cyanosis or edema.  DP +1 Neuro: Alert and oriented X 3. Psych:  Good affect, responds appropriately   Labs:   Lab Results  Component Value Date   WBC 10.0 07/26/2017   HGB 12.2 (L) 07/26/2017   HCT 37.2 (L) 07/26/2017   MCV 95 07/26/2017   PLT 185 07/26/2017   No results for input(s): NA, K, CL, CO2, BUN, CREATININE, CALCIUM, PROT, BILITOT, ALKPHOS, ALT, AST, GLUCOSE in the last 168 hours.  Invalid input(s): LABALBU No results found for: CKTOTAL, CKMB, CKMBINDEX, TROPONINI No results found for: PTT Lab Results  Component Value Date     INR 1.1 07/26/2017   INR 1.1 01/21/2008  No results found for: CHOL No results found for: HDL No results found for: LDLCALC No results found for: TRIG No results found for: CHOLHDL No results found for: LDLDIRECT    Radiology:  No results found.  EKG:  On August 15, atrial fibrillation with controlled ventricular response.  ECG from [facility] pending. Telemetry shows sinus bradycardia  ASSESSMENT: Atrial fibrillation/tachybradycardia syndrome, coronary artery disease  PLAN:  Atrial fibrillation: Holding rate slowing agents. Holding amiodarone as well. He may need a pacemaker. For now, continue low-dose dopamine to maintain heart rate. We'll have electrophysiology evaluation.  Coronary artery disease: No angina. No signs of ischemia. Continue statin.  Diabetes: Sliding scale insulin    Larae Grooms, MD  08/22/2017  12:23 AM

## 2017-08-23 ENCOUNTER — Encounter: Payer: Self-pay | Admitting: Cardiology

## 2017-08-23 ENCOUNTER — Inpatient Hospital Stay (HOSPITAL_COMMUNITY): Payer: Medicare Other

## 2017-08-23 ENCOUNTER — Encounter (HOSPITAL_COMMUNITY): Payer: Self-pay | Admitting: Cardiology

## 2017-08-23 ENCOUNTER — Other Ambulatory Visit: Payer: Self-pay | Admitting: Physician Assistant

## 2017-08-23 DIAGNOSIS — Z79899 Other long term (current) drug therapy: Secondary | ICD-10-CM

## 2017-08-23 LAB — GLUCOSE, CAPILLARY
GLUCOSE-CAPILLARY: 130 mg/dL — AB (ref 65–99)
Glucose-Capillary: 162 mg/dL — ABNORMAL HIGH (ref 65–99)

## 2017-08-23 MED ORDER — ACETAMINOPHEN 500 MG PO TABS: 1000.0000 mg | ORAL_TABLET | Freq: Four times a day (QID) | ORAL | Status: DC | PRN

## 2017-08-23 MED ORDER — MUPIROCIN 2 % EX OINT: 1.0000 "application " | TOPICAL_OINTMENT | Freq: Two times a day (BID) | CUTANEOUS | 0 refills | Status: AC

## 2017-08-23 NOTE — Care Management Note (Signed)
Case Management Note  Patient Details  Name: EFE FAZZINO MRN: [id number] Date of Birth: May 24, 1946  Subjective/Objective:  From home with wife, pta indep, s/p pacemaker insertion.                   Action/Plan: NCM will follow for dc needs.   Expected Discharge Date:  08/23/17               Expected Discharge Plan:  Home/Self Care  In-House Referral:     Discharge planning Services  CM Consult  Post Acute Care Choice:    Choice offered to:     DME Arranged:    DME Agency:     HH Arranged:    HH Agency:     Status of Service:  Completed, signed off  If discussed at [employer] of Stay Meetings, dates discussed:    Additional Comments:  Zenon Mayo, RN 08/23/2017, 9:23 AM

## 2017-08-23 NOTE — Discharge Summary (Signed)
ELECTROPHYSIOLOGY PROCEDURE DISCHARGE SUMMARY    Patient ID: Lawrence [name],  MRN: [id number], DOB/AGE: 72/29/1947 72 y.o.  Admit date: 08/21/2017 Discharge date: 08/23/2017  Primary Care Physician: [name], MD  Primary Cardiologist: Dr. Edyth Gunnels Electrophysiologist: new, Dr. Curt Bears  Primary Discharge Diagnosis:  1. Symptomatic bradycardia status post pacemaker implantation this admission 2. CRI  Secondary Discharge Diagnosis:  1. CAD 2. Paroxysmal AFib     CHA2DS2Vasc is at least on Eliquis 3. HTN 4. HLD  Allergies  Allergen Reactions  . Bactrim [Sulfamethoxazole-Trimethoprim]      Procedures This Admission:  1.  Implantation of a SJM dual chamber PPM on 08/22/17 by Dr Curt Bears.  The patient received a Com[employer] MRI  model [id number] (serial number  [id number] ) pacemaker a [employer] model [id number] (serial number  [id number]) right atrial lead and a [employer] model [id number] (serial number  [id number]) right ventricular lead There were no immediate post procedure complications. 2.  CXR on 08/23/17 demonstrated no pneumothorax status post device implantation.   Brief HPI: Lawrence [name] is a 71 y.o. male underwent elective DCCV at [facility], after which he was markedly bradycardic, persistently 30's and requiring dopamine to maintain HR.  His amiodarone and bystolic were held and despite this unable to wean off dopamine with bradycardia.  He was transferred to [facility] for further management and likely pacing.  Hospital Course:  The patient was admitted and 5 5/0PTWSF of his bystolic had passed, dopamine was titrated to off with persistent HR about 40 with feeliongs of weakness, and weakness, and underwent implantation of a PPM with details as outlined above.  He was monitored on telemetry overnight which demonstrated A pacing V sensing.  Left chest was without hematoma or ecchymosis.  The device was interrogated and found to be functioning normally.   CXR was obtained and demonstrated no pneumothorax status post device implantation. Patient is encouraged OOB, and instructed just for today to take his lasix BID, [name] BMET at his wound check visit, Creat was trending down yesterday with known CRI.  Noted elevated WBC at [place] also trending down,  patientwas afebrile, negative UA, and no symptoms of illness, felt likely to have been reactive 2/2 DCCV, +/- bradycardia. Wound care, arm mobility, and restrictions were reviewed with the patient.  The patient was examined by Dr. Curt Bears and considered stable for discharge to home.    Physical Exam: Vitals:   08/23/17 0535 08/23/17 0645 08/23/17 0647 08/23/17 0720  BP: (!) 173/70 (!) 163/60 (!) 150/56 (!) 158/56  Pulse: 68   62  Resp: 17 (!) 21 (!) 22 (!) 24  Temp: (!) 97 F (36.1 C)   99.4 F (37.4 C)  TempSrc: Axillary   Oral  SpO2: 97%   96%  Weight: 257 lb 15 oz (117 kg)     Height:        GEN- The patient is well appearing, alert and oriented x 3 today.   HEENT: normocephalic, atraumatic; sclera clear, conjunctiva pink; hearing intact; oropharynx clear; neck supple, no JVP Lungs- CTA b/l, normal work of breathing.  No wheezes, rales, rhonchi Heart- RRR, no murmurs, rubs or gallops, PMI not laterally displaced GI- soft, non-tender, non-distended, obese Extremities- no clubbing, cyanosis, or edemaMS- no significant deformity or atrophy Skin- warm and dry, no rash or lesion, left chest without hematoma/ecchymosis Psych- euthymic mood, full affect Neuro- no gross deficits   Labs:   Lab Results  Component Value Date   WBC 13.3 (H) 08/22/2017   HGB 11.2 (L) 08/22/2017   HCT 34.3 (L) 08/22/2017   MCV 96.1 08/22/2017   PLT 158 08/22/2017     Recent Labs Lab 08/22/17 0435  NA 135  K 4.1  CL 102  CO2 25  BUN 52*  CREATININE 2.07*  CALCIUM 8.4*  GLUCOSE 172*    Discharge Medications:  Allergies as of 08/23/2017      Reactions   Bactrim [sulfamethoxazole-trimethoprim]        Medication List    TAKE these medications   amiodarone 400 MG tablet Commonly known as:  PACERONE Take 1 tablet (400 mg total) by mouth 2 (two) times daily.   COMBIVENT RESPIMAT 20-100 MCG/ACT Aers respimat Generic drug:  Ipratropium-Albuterol Inhale 1 puff into the lungs daily as needed for wheezing.   DUONEB 0.5-2.5 (3) MG/3ML Soln Generic drug:  ipratropium-albuterol Take 3 mLs by nebulization as needed.   ELIQUIS 5 MG Tabs tablet Generic drug:  apixaban Take 5 mg by mouth 2 (two) times daily.   febuxostat 40 MG tablet Commonly known as:  ULORIC Take 40 mg by mouth daily.   furosemide 20 MG tablet Commonly known as:  LASIX Take 20 mg by mouth daily. Notes to patient:  Take twice today only, then resume daily   gabapentin 600 MG tablet Commonly known as:  NEURONTIN Take 600 mg by mouth 2 (two) times daily.   glimepiride 4 MG tablet Commonly known as:  AMARYL Take 4 mg by mouth 2 (two) times daily.   mupirocin ointment 2 % Commonly known as:  BACTROBAN Place 1 application into the nose 2 (two) times daily.   Nebivolol HCl 20 MG Tabs Take 0.5 tablets (10 mg total) by mouth daily.   nitroGLYCERIN 0.4 MG SL tablet Commonly known as:  NITROSTAT Place 0.4 mg under the tongue.   pantoprazole 40 MG tablet Commonly known as:  PROTONIX Take 40 mg by mouth daily.   simvastatin 20 MG tablet Commonly known as:  ZOCOR Take 20 mg by mouth at bedtime.   TRADJENTA 5 MG Tabs tablet Generic drug:  linagliptin Take 5 mg by mouth daily.   VICTOZA 18 MG/3ML Sopn Generic drug:  liraglutide Inject 1.8 mg into the skin daily.            Discharge Care Instructions        Start     Ordered   08/23/17 0000  Increase activity slowly     08/23/17 0835   08/23/17 0000  Diet - low sodium heart healthy     08/23/17 0835   08/23/17 0000  mupirocin ointment (BACTROBAN) 2 %  2 times daily    Question:  Supervising Provider  Answer:  Thompson Grayer   08/23/17 0921       Disposition:  Home Discharge Instructions    Diet - low sodium heart healthy    Complete by:  As directed    Increase activity slowly    Complete by:  As directed      Follow-up Information    [place] [facility] Follow up on 09/05/2017.   Specialty:  Cardiology Why:  11:00AM, wound check visit, lab draw Contact information: [address], [address] [place]       Constance Haw, MD Follow up on 11/27/2017.   Specialty:  Cardiology Why:  10:30AM Contact information: [address] [place] [address]  [phone]           Duration of Discharge Encounter: Greater than 30 minutes including physician time.  Signed, Tommye Standard, PA-C 08/23/2017 9:22 AM   I have seen and examined this patient with Tommye Standard.  Agree with above, note added to reflect my findings.  On exam, RRR, no murmurs, lungs clear. Had dual chamber pacemaker implanted for sick sinus syndrome. Was put on dopamine for HR control as bystolic washout occurred. HR in the low 40s without dopamine and increased to the low 50s with ambulation. CXR and interrogation without issue. Plan for discharge today with follow up in device clinic.    Alaynna Kerwood M. Barbee Mamula MD 08/24/2017 7:50 AM

## 2017-08-23 NOTE — Discharge Instructions (Addendum)
Supplemental Discharge Instructions for  Pacemaker/Defibrillator Patients  Activity No heavy lifting or vigorous activity with your left/right arm for 6 to 8 weeks.  Do not raise your left/right arm above your head for one week.  Gradually raise your affected arm as drawn below.              08/26/17                      08/27/17                      08/28/17                     08/29/17 __  NO DRIVING for 1 week ; you may begin driving on  05/26/32 .  WOUND CARE - Keep the wound area clean and dry.  Do not get this area wet, no showers untiil cleared to at your wound check visit . - The tape/steri-strips on your wound will fall off; do not pull them off.  No bandage is needed on the site.  DO  NOT apply any creams, oils, or ointments to the wound area. - If you notice any drainage or discharge from the wound, any swelling or bruising at the site, or you develop a fever > 101? F after you are discharged home, call the office at once.  Special Instructions - You are still able to use cellular telephones; use the ear opposite the side where you have your pacemaker/defibrillator.  Avoid carrying your cellular phone near your device. - When traveling through airports, show security personnel your identification card to avoid being screened in the metal detectors.  Ask the security personnel to use the hand wand. - Avoid arc welding equipment, MRI testing (magnetic resonance imaging), TENS units (transcutaneous nerve stimulators).  Call the office for questions about other devices. - Avoid electrical appliances that are in poor condition or are not properly grounded. - Microwave ovens are safe to be near or to operate.  Additional information for defibrillator patients should your device go off: - If your device goes off ONCE and you feel fine afterward, notify the device clinic nurses. - If your device goes off ONCE and you do not feel well afterward, call 911. - If your device goes off TWICE,  call 911. - If your device goes off THREE times in one day, call 911.  DO NOT DRIVE YOURSELF OR A FAMILY MEMBER WITH A DEFIBRILLATOR TO THE HOSPITAL--CALL 911.      Information on my medicine - ELIQUIS (apixaban)  Why was Eliquis prescribed for you? Eliquis was prescribed for you to reduce the risk of a blood clot forming that can cause a stroke if you have a medical condition called atrial fibrillation (a type of irregular heartbeat).  What do You need to know about Eliquis ? Take your Eliquis TWICE DAILY - one tablet in the morning and one tablet in the evening with or without food. If you have difficulty swallowing the tablet whole please discuss with your pharmacist how to take the medication safely.  Take Eliquis exactly as prescribed by your doctor and DO NOT stop taking Eliquis without talking to the doctor who prescribed the medication.  Stopping may increase your risk of developing a stroke.  Refill your prescription before you run out.  After discharge, you should have regular check-up appointments with your healthcare provider that is prescribing your Eliquis.  In the future your dose may need to be changed if your kidney function or weight changes by a significant amount or as you get older.  What do you do if you miss a dose? If you miss a dose, take it as soon as you remember on the same day and resume taking twice daily.  Do not take more than one dose of ELIQUIS at the same time to make up a missed dose.  Important Safety Information A possible side effect of Eliquis is bleeding. You should call your healthcare provider right away if you experience any of the following: ? Bleeding from an injury or your nose that does not stop. ? Unusual colored urine (red or dark brown) or unusual colored stools (red or black). ? Unusual bruising for unknown reasons. ? A serious fall or if you hit your head (even if there is no bleeding).  Some medicines may interact with  Eliquis and might increase your risk of bleeding or clotting while on Eliquis. To help avoid this, consult your healthcare provider or pharmacist prior to using any new prescription or non-prescription medications, including herbals, vitamins, non-steroidal anti-inflammatory drugs (NSAIDs) and supplements.  This website has more information on Eliquis (apixaban): http://www.eliquis.com/eliquis/home

## 2017-08-27 ENCOUNTER — Ambulatory Visit: Payer: Medicare Other | Admitting: Cardiology

## 2017-09-03 DIAGNOSIS — J309 Allergic rhinitis, unspecified: Secondary | ICD-10-CM | POA: Diagnosis not present

## 2017-09-03 DIAGNOSIS — J329 Chronic sinusitis, unspecified: Secondary | ICD-10-CM | POA: Diagnosis not present

## 2017-09-03 DIAGNOSIS — R5383 Other fatigue: Secondary | ICD-10-CM | POA: Diagnosis not present

## 2017-09-03 DIAGNOSIS — E1165 Type 2 diabetes mellitus with hyperglycemia: Secondary | ICD-10-CM | POA: Diagnosis not present

## 2017-09-03 DIAGNOSIS — R42 Dizziness and giddiness: Secondary | ICD-10-CM | POA: Diagnosis not present

## 2017-09-03 DIAGNOSIS — Z6841 Body Mass Index (BMI) 40.0 and over, adult: Secondary | ICD-10-CM | POA: Diagnosis not present

## 2017-09-05 ENCOUNTER — Ambulatory Visit (INDEPENDENT_AMBULATORY_CARE_PROVIDER_SITE_OTHER): Payer: Medicare Other | Admitting: *Deleted

## 2017-09-05 ENCOUNTER — Other Ambulatory Visit: Payer: Medicare Other | Admitting: *Deleted

## 2017-09-05 DIAGNOSIS — I4891 Unspecified atrial fibrillation: Secondary | ICD-10-CM

## 2017-09-05 DIAGNOSIS — Z79899 Other long term (current) drug therapy: Secondary | ICD-10-CM | POA: Diagnosis not present

## 2017-09-05 DIAGNOSIS — N289 Disorder of kidney and ureter, unspecified: Secondary | ICD-10-CM | POA: Diagnosis not present

## 2017-09-05 LAB — CUP PACEART INCLINIC DEVICE CHECK
Battery Remaining Longevity: 74 mo
Brady Statistic RA Percent Paced: 88 %
Brady Statistic RV Percent Paced: 80 %
Implantable Lead Implant Date: 20180830
Implantable Lead Location: 753859
Lead Channel Impedance Value: 462.5 Ohm
Lead Channel Impedance Value: 525 Ohm
Lead Channel Pacing Threshold Amplitude: 0.75 V
Lead Channel Pacing Threshold Pulse Width: 0.5 ms
Lead Channel Pacing Threshold Pulse Width: 0.5 ms
Lead Channel Pacing Threshold Pulse Width: 0.5 ms
Lead Channel Sensing Intrinsic Amplitude: 1.8 mV
Lead Channel Setting Pacing Amplitude: 3.5 V
MDC IDC LEAD IMPLANT DT: 20180830
MDC IDC LEAD LOCATION: 753860
MDC IDC MSMT BATTERY VOLTAGE: 3.04 V
MDC IDC MSMT LEADCHNL RA PACING THRESHOLD AMPLITUDE: 1 V
MDC IDC MSMT LEADCHNL RV PACING THRESHOLD AMPLITUDE: 0.75 V
MDC IDC MSMT LEADCHNL RV SENSING INTR AMPL: 10.3 mV
MDC IDC PG IMPLANT DT: 20180830
MDC IDC PG SERIAL: 8939815
MDC IDC SESS DTM: 20180913145125
MDC IDC SET LEADCHNL RA PACING AMPLITUDE: 2 V
MDC IDC SET LEADCHNL RV PACING PULSEWIDTH: 0.5 ms
MDC IDC SET LEADCHNL RV SENSING SENSITIVITY: 2 mV

## 2017-09-05 LAB — BASIC METABOLIC PANEL
BUN/Creatinine Ratio: 22 (ref 10–24)
BUN: 42 mg/dL — ABNORMAL HIGH (ref 8–27)
CALCIUM: 8.7 mg/dL (ref 8.6–10.2)
CHLORIDE: 104 mmol/L (ref 96–106)
CO2: 24 mmol/L (ref 20–29)
Creatinine, Ser: 1.94 mg/dL — ABNORMAL HIGH (ref 0.76–1.27)
GFR calc Af Amer: 39 mL/min/{1.73_m2} — ABNORMAL LOW (ref >59)
GFR calc non Af Amer: 34 mL/min/{1.73_m2} — ABNORMAL LOW (ref >59)
GLUCOSE: 272 mg/dL — AB (ref 65–99)
Potassium: 4.2 mmol/L (ref 3.5–5.2)
Sodium: 144 mmol/L (ref 134–144)

## 2017-09-05 NOTE — Progress Notes (Signed)
Wound check appointment. Steri-strips removed. Wound without redness or edema. Incision edges approximated, wound well healed. Normal device function. Thresholds, sensing, and impedances consistent with implant measurements. Device programmed at 3.5V/auto capture programmed on for extra safety margin until 3 month visit. Histogram distribution appropriate for patient and level of activity. No mode switches or high ventricular rates noted. Patient educated about wound care, arm mobility, lifting restrictions. ROV with WC 12/5.

## 2017-09-13 ENCOUNTER — Encounter: Payer: Self-pay | Admitting: Cardiology

## 2017-09-13 ENCOUNTER — Ambulatory Visit (INDEPENDENT_AMBULATORY_CARE_PROVIDER_SITE_OTHER): Payer: Medicare Other | Admitting: Cardiology

## 2017-09-13 ENCOUNTER — Ambulatory Visit (HOSPITAL_BASED_OUTPATIENT_CLINIC_OR_DEPARTMENT_OTHER)
Admission: RE | Admit: 2017-09-13 | Discharge: 2017-09-13 | Disposition: A | Discharge status: Home / Self Care | Payer: Medicare Other | Source: Ambulatory Visit | Attending: Cardiology | Admitting: Cardiology

## 2017-09-13 VITALS — BP 110/70 | HR 88 | Resp 10 | Ht 72.0 in | Wt 272.0 lb

## 2017-09-13 DIAGNOSIS — I2510 Atherosclerotic heart disease of native coronary artery without angina pectoris: Secondary | ICD-10-CM | POA: Diagnosis not present

## 2017-09-13 DIAGNOSIS — I480 Paroxysmal atrial fibrillation: Secondary | ICD-10-CM

## 2017-09-13 DIAGNOSIS — I10 Essential (primary) hypertension: Secondary | ICD-10-CM

## 2017-09-13 DIAGNOSIS — R0609 Other forms of dyspnea: Secondary | ICD-10-CM | POA: Insufficient documentation

## 2017-09-13 DIAGNOSIS — Z950 Presence of cardiac pacemaker: Secondary | ICD-10-CM

## 2017-09-13 DIAGNOSIS — E782 Mixed hyperlipidemia: Secondary | ICD-10-CM

## 2017-09-13 DIAGNOSIS — I517 Cardiomegaly: Secondary | ICD-10-CM | POA: Diagnosis not present

## 2017-09-13 DIAGNOSIS — R918 Other nonspecific abnormal finding of lung field: Secondary | ICD-10-CM | POA: Insufficient documentation

## 2017-09-13 DIAGNOSIS — R0602 Shortness of breath: Secondary | ICD-10-CM | POA: Diagnosis not present

## 2017-09-13 DIAGNOSIS — J410 Simple chronic bronchitis: Secondary | ICD-10-CM | POA: Diagnosis not present

## 2017-09-13 HISTORY — DX: Presence of cardiac pacemaker: Z95.0

## 2017-09-13 MED ORDER — FUROSEMIDE 20 MG PO TABS: 40.0000 mg | ORAL_TABLET | Freq: Every day | ORAL | 6 refills | Status: DC

## 2017-09-13 NOTE — Progress Notes (Signed)
Cardiology Office Note:    Date:  09/13/2017   ID:  Lawrence [name], DOB 13-Apr-1946, MRN [id number]  PCP:  [name], MD  Cardiologist:  [name], MD    Referring MD: [name], MD   Chief Complaint  Patient presents with  . Follow up Cardioversion and PPM  I'm not doing well  History of Present Illness:    Lawrence [name] is a 71 y.o. male  With the recent hospitalization. He does have paroxysmal mitral fibrillation, initially he was brought to the hospital for cardioversion which was successful but then later he showed up again in atrial fibrillation. He was reloaded with amiodarone and then had another cardioversion however after he converted to sinus rhythm his heart rate was very slow 25-35 and he required some chronotropic support with Isuprel.Eventually he was transferred to [facility] and dual-chamber pacemaker implanted. He is not doing well he started exhausted short of breath.Had difficulty sleeping because of shortness of breath.There is some sg of lower extremities as well.He gained a lot of weight as well. I think he simply overloaded with fluid.However, more about potential complmaker implantation.we will check he is proBNP as well as chem 12, chest x-ray will be done,pacemaker has been interrogated already been functioning properly. There are no episodes of atrial fibrillation.  Past Medical History:  Diagnosis Date  . Anemia   . Chronic airway obstruction, not elsewhere classified   . Chronic ischemic heart disease, unspecified   . Coronary artery disease   . Diabetes mellitus    type II, neuropathy,   . Diabetes mellitus without complication ([place])   . Essential hypertension, benign   . Hyperlipidemia   . Hyperlipidemia, mixed   . Hypertension   . Nodule of kidney    incidentally found on CT abdomen 11/2012.  Pending Urology evaluation.   . Nontoxic multinodular goiter   . Obesity   . Other testicular hypofunction   . PUD (peptic ulcer  disease)   . Renal insufficiency, mild    hx of    Past Surgical History:  Procedure Laterality Date  . CHOLECYSTECTOMY    . CORONARY ARTERY BYPASS GRAFT  2007   x 4  . CORONARY ARTERY BYPASS GRAFT    . ESOPHAGOGASTRODUODENOSCOPY     10/06/2012:  hiatal hernia, moderate gastritis.  2012: Colonoscopy at [facility]:  one polyp.  Due next 2017  . EYE SURGERY    . KNEE DEBRIDEMENT    . PACEMAKER IMPLANT N/A 08/22/2017   Procedure: Pacemaker Implant;  Surgeon: Constance Haw, MD;  Location: [place];  Service: Cardiovascular;  Laterality: N/A;  . SHOULDER SURGERY     left shoulder  . SHOULDER SURGERY    . TONSILECTOMY, ADENOIDECTOMY, BILATERAL MYRINGOTOMY AND TUBES      Current Medications: Current Meds  Medication Sig  . amiodarone (PACERONE) 400 MG tablet Take 1 tablet (400 mg total) by mouth 2 (two) times daily.  Marland Kitchen apixaban (ELIQUIS) 5 MG TABS tablet Take 5 mg by mouth 2 (two) times daily.  . febuxostat (ULORIC) 40 MG tablet Take 40 mg by mouth daily.  . furosemide (LASIX) 20 MG tablet Take 2 tablets (40 mg total) by mouth daily.  Marland Kitchen gabapentin (NEURONTIN) 600 MG tablet Take 600 mg by mouth 2 (two) times daily.   Marland Kitchen glimepiride (AMARYL) 4 MG tablet Take 4 mg by mouth 2 (two) times daily.   . Ipratropium-Albuterol (COMBIVENT RESPIMAT) 20-100 MCG/ACT AERS respimat Inhale  1 puff into the lungs daily as needed for wheezing.  Marland Kitchen ipratropium-albuterol (DUONEB) 0.5-2.5 (3) MG/3ML SOLN Take 3 mLs by nebulization as needed.  . linagliptin (TRADJENTA) 5 MG TABS tablet Take 5 mg by mouth daily.  . nebivolol (BYSTOLIC) 5 MG tablet Take 5 mg by mouth daily.  . nitroGLYCERIN (NITROSTAT) 0.4 MG SL tablet Place 0.4 mg under the tongue.  . pantoprazole (PROTONIX) 40 MG tablet Take 40 mg by mouth daily.   . simvastatin (ZOCOR) 20 MG tablet Take 20 mg by mouth at bedtime.   . [DISCONTINUED] furosemide (LASIX) 20 MG tablet Take 20 mg by mouth daily.  . [DISCONTINUED] furosemide (LASIX) 20 MG  tablet Take 2 tablets (40 mg total) by mouth daily.  . [DISCONTINUED] Nebivolol HCl 20 MG TABS Take 0.5 tablets (10 mg total) by mouth daily.     Allergies:   Bactrim [sulfamethoxazole-trimethoprim]   Social History   Social History  . Marital status: Married    Spouse name: N/A  . Number of children: 5  . Years of education: N/A   Occupational History  . Retired     Medical illustrator; retired Chiropodist   Social History Main Topics  . Smoking status: Former Smoker    Packs/day: 1.50    Years: 30.00    Types: Cigarettes  . Smokeless tobacco: Former Systems developer    Types: Chew  . Alcohol use Yes  . Drug use: No  . Sexual activity: Not Asked   Other Topics Concern  . None   Social History Narrative   ** Merged History Encounter **       Lives with wife in a one story home.  Has 6 children.  Retired Nature conservation officer.  Education: college.     Family History: The patient's family history includes Asthma in his sister; Cancer in his father and mother; Colon cancer in his mother; Emphysema in his unknown relative; Esophageal cancer in his father; Hypertension in his mother; Kidney cancer in his mother; Stroke in his father. ROS:   Please see the history of present illness.    All 14 point review of systems negative except as described per history of present illness  EKGs/Labs/Other Studies Reviewed:      Recent Labs: 07/26/2017: ALT 14 08/22/2017: Hemoglobin 11.2; Platelets 158; TSH 0.585 09/05/2017: BUN 42; Creatinine, Ser 1.94; Potassium 4.2; Sodium 144  Recent Lipid Panel No results found for: CHOL, TRIG, HDL, CHOLHDL, VLDL, LDLCALC, LDLDIRECT  Physical Exam:    VS:  BP 110/70   Pulse 88   Resp 10   Ht 6' (1.829 m)   Wt 272 lb (123.4 kg)   BMI 36.89 kg/m     Wt Readings from Last 3 Encounters:  09/13/17 272 lb (123.4 kg)  08/23/17 257 lb 15 oz (117 kg)  07/26/17 254 lb 12.8 oz (115.6 kg)     GEN:  Well nourished, well developed in no acute distress HEENT:  Normal NECK: No JVD; No carotid bruits LYMPHATICS: No lymphadenopathy CARDIAC: RRR, no murmurs, no rubs, no gallops RESPIRATORY:  Clear to auscultation without rales, wheezing or rhonchi  ABDOMEN: Soft, non-tender, non-distended MUSCULOSKELETAL:  No edema; No deformity  SKIN: Warm and dry LOWER EXTREMITIES: + swelling NEUROLOGIC:  Alert and oriented x 3 PSYCHIATRIC:  Normal affect   ASSESSMENT:    1. DOE (dyspnea on exertion)   2. S/P placement of cardiac pacemaker   3. Coronary artery disease involving native coronary artery of native heart without angina pectoris  4. Essential hypertension, benign   5. Paroxysmal atrial fibrillation (HCC)   6. Simple chronic bronchitis ([place])   7. Hyperlipidemia, mixed   8. Pacemaker    PLAN:    In order of problems listed above:  1. yspnea on exertion: Plan as outlined above. 2. Status post pacemaker implantation:  Wound looks good, minimal swelling, no discharge or redness. 3. Essential ypertension: Blood pressure appears to be well-controlled will continue present management. 4. I will ask him to double the dose of furosemide.I see him back ine week.   Medication Adjustments/Labs and Tests Ordered: Current medicines are reviewed at length with the patient today.  Concerns regarding medicines are outlined above.  Orders Placed This Encounter  Procedures  . DG Chest 2 View  . Comprehensive metabolic panel  . CBC with Differential/Platelet  . B Nat Peptide  . ECHOCARDIOGRAM COMPLETE   Medication changes:  Meds ordered this encounter  Medications  . DISCONTD: furosemide (LASIX) 20 MG tablet    Sig: Take 2 tablets (40 mg total) by mouth daily.    Dispense:  60 tablet    Refill:  6  . furosemide (LASIX) 20 MG tablet    Sig: Take 2 tablets (40 mg total) by mouth daily.    Dispense:  60 tablet    Refill:  6    Signed, Park Liter, MD, [facility] 09/13/2017 4:30 PM     [facility]

## 2017-09-13 NOTE — Patient Instructions (Signed)
Medication Instructions:  Your physician has recommended you make the following change in your medication:  1.) INCREASE lasix to 40 mg daily 2.)DECREASE Amiodarone to 200 mg twice daily.   Labwork: Your physician recommends that you have lab work today to check fluid build up, full metabolic panel, and complete blood cell count.  Testing/Procedures: Your physician has requested that you have an echocardiogram. Echocardiography is a painless test that uses sound waves to create images of your heart. It provides your doctor with information about the size and shape of your heart and how well your heart's chambers and valves are working. This procedure takes approximately one hour. There are no restrictions for this procedure.  Follow-Up: Your physician recommends that you schedule a follow-up appointment in: 1 week based on testing  Any Other Special Instructions Will Be Listed Below (If Applicable).  Please note that any paperwork needing to be filled out by the provider will need to be addressed at the front desk prior to seeing the provider. Please note that any paperwork FMLA, Disability or other documents regarding health condition is subject to a $25.00 charge that must be received prior to completion of paperwork in the form of a money order or check.     If you need a refill on your cardiac medications before your next appointment, please call your pharmacy.

## 2017-09-14 LAB — COMPREHENSIVE METABOLIC PANEL
A/G RATIO: 2 (ref 1.2–2.2)
ALT: 13 IU/L (ref 0–44)
AST: 22 IU/L (ref 0–40)
Albumin: 4.4 g/dL (ref 3.5–4.8)
Alkaline Phosphatase: 135 IU/L — ABNORMAL HIGH (ref 39–117)
BUN/Creatinine Ratio: 14 (ref 10–24)
BUN: 32 mg/dL — ABNORMAL HIGH (ref 8–27)
Bilirubin Total: 0.7 mg/dL (ref 0.0–1.2)
CALCIUM: 9.1 mg/dL (ref 8.6–10.2)
CO2: 27 mmol/L (ref 20–29)
CREATININE: 2.28 mg/dL — AB (ref 0.76–1.27)
Chloride: 103 mmol/L (ref 96–106)
GFR, EST AFRICAN AMERICAN: 32 mL/min/{1.73_m2} — AB (ref >59)
GFR, EST NON AFRICAN AMERICAN: 28 mL/min/{1.73_m2} — AB (ref >59)
GLOBULIN, TOTAL: 2.2 g/dL (ref 1.5–4.5)
Glucose: 181 mg/dL — ABNORMAL HIGH (ref 65–99)
POTASSIUM: 5.1 mmol/L (ref 3.5–5.2)
SODIUM: 146 mmol/L — AB (ref 134–144)
TOTAL PROTEIN: 6.6 g/dL (ref 6.0–8.5)

## 2017-09-14 LAB — CBC WITH DIFFERENTIAL/PLATELET
BASOS: 0 %
Basophils Absolute: 0 10*3/uL (ref 0.0–0.2)
EOS (ABSOLUTE): 0.3 10*3/uL (ref 0.0–0.4)
EOS: 3 %
HEMATOCRIT: 32.5 % — AB (ref 37.5–51.0)
Hemoglobin: 10.4 g/dL — ABNORMAL LOW (ref 13.0–17.7)
IMMATURE GRANS (ABS): 0 10*3/uL (ref 0.0–0.1)
IMMATURE GRANULOCYTES: 0 %
LYMPHS: 11 %
Lymphocytes Absolute: 1 10*3/uL (ref 0.7–3.1)
MCH: 31 pg (ref 26.6–33.0)
MCHC: 32 g/dL (ref 31.5–35.7)
MCV: 97 fL (ref 79–97)
MONOS ABS: 1.1 10*3/uL — AB (ref 0.1–0.9)
Monocytes: 12 %
NEUTROS ABS: 6.5 10*3/uL (ref 1.4–7.0)
NEUTROS PCT: 74 %
Platelets: 221 10*3/uL (ref 150–379)
RBC: 3.36 x10E6/uL — ABNORMAL LOW (ref 4.14–5.80)
RDW: 15.2 % (ref 12.3–15.4)
WBC: 8.9 10*3/uL (ref 3.4–10.8)

## 2017-09-14 LAB — BRAIN NATRIURETIC PEPTIDE: BNP: 614.3 pg/mL — ABNORMAL HIGH (ref 0.0–100.0)

## 2017-09-16 ENCOUNTER — Encounter: Payer: Self-pay | Admitting: Cardiology

## 2017-09-16 DIAGNOSIS — Z79899 Other long term (current) drug therapy: Secondary | ICD-10-CM

## 2017-09-17 ENCOUNTER — Ambulatory Visit (HOSPITAL_BASED_OUTPATIENT_CLINIC_OR_DEPARTMENT_OTHER)
Admission: RE | Admit: 2017-09-17 | Discharge: 2017-09-17 | Disposition: A | Discharge status: Home / Self Care | Payer: Medicare Other | Source: Ambulatory Visit | Attending: Cardiology | Admitting: Cardiology

## 2017-09-17 DIAGNOSIS — R0609 Other forms of dyspnea: Secondary | ICD-10-CM

## 2017-09-17 DIAGNOSIS — Z950 Presence of cardiac pacemaker: Secondary | ICD-10-CM | POA: Diagnosis not present

## 2017-09-17 DIAGNOSIS — I081 Rheumatic disorders of both mitral and tricuspid valves: Secondary | ICD-10-CM | POA: Diagnosis not present

## 2017-09-17 DIAGNOSIS — I420 Dilated cardiomyopathy: Secondary | ICD-10-CM | POA: Insufficient documentation

## 2017-09-17 DIAGNOSIS — Z79899 Other long term (current) drug therapy: Secondary | ICD-10-CM | POA: Diagnosis not present

## 2017-09-17 DIAGNOSIS — I5032 Chronic diastolic (congestive) heart failure: Secondary | ICD-10-CM | POA: Diagnosis not present

## 2017-09-17 LAB — ECHOCARDIOGRAM COMPLETE
CHL CUP DOP CALC LVOT VTI: 16.4 cm
EERAT: 10.82
EWDT: 271 ms
FS: 33 % (ref 28–44)
IV/PV OW: 1.12
LA ID, A-P, ES: 43 mm
LA diam end sys: 43 mm
LA diam index: 1.69 cm/m2
LA vol: 57.9 mL
LAVOLA4C: 51.1 mL
LAVOLIN: 22.7 mL/m2
LV E/e'average: 10.82
LV PW d: 10.6 mm — AB (ref 0.6–1.1)
LV TDI E'MEDIAL: 9.24
LV e' LATERAL: 9.89 cm/s
LVEEMED: 10.82
LVOT SV: 62 mL
LVOT area: 3.8 cm2
LVOT diameter: 22 mm
LVOTPV: 79.6 cm/s
Lateral S' vel: 7.3 cm/s
MV Dec: 271
MV Peak grad: 5 mmHg
MV pk E vel: 107 m/s
MVPKAVEL: 46.3 m/s
PV Reg vel dias: 71.9 cm/s
RV TAPSE: 18.6 mm
RV sys press: 32 mmHg
Reg peak vel: 244 cm/s
TDI e' lateral: 9.89
TRMAXVEL: 244 cm/s

## 2017-09-17 NOTE — Progress Notes (Signed)
  Echocardiogram 2D Echocardiogram has been performed.  Pauline Trainer T Merel Santoli 09/17/2017, 4:18 PM

## 2017-09-18 ENCOUNTER — Encounter: Payer: Self-pay | Admitting: Cardiology

## 2017-09-18 LAB — BASIC METABOLIC PANEL
BUN / CREAT RATIO: 15 (ref 10–24)
BUN: 38 mg/dL — ABNORMAL HIGH (ref 8–27)
CO2: 27 mmol/L (ref 20–29)
CREATININE: 2.49 mg/dL — AB (ref 0.76–1.27)
Calcium: 9.2 mg/dL (ref 8.6–10.2)
Chloride: 98 mmol/L (ref 96–106)
GFR calc Af Amer: 29 mL/min/{1.73_m2} — ABNORMAL LOW (ref >59)
GFR calc non Af Amer: 25 mL/min/{1.73_m2} — ABNORMAL LOW (ref >59)
GLUCOSE: 198 mg/dL — AB (ref 65–99)
POTASSIUM: 4.8 mmol/L (ref 3.5–5.2)
SODIUM: 141 mmol/L (ref 134–144)

## 2017-09-18 LAB — PRO B NATRIURETIC PEPTIDE: NT-PRO BNP: 6365 pg/mL — AB (ref 0–376)

## 2017-09-20 ENCOUNTER — Ambulatory Visit (INDEPENDENT_AMBULATORY_CARE_PROVIDER_SITE_OTHER): Payer: Medicare Other | Admitting: Cardiology

## 2017-09-20 ENCOUNTER — Encounter: Payer: Self-pay | Admitting: Cardiology

## 2017-09-20 VITALS — BP 130/74 | HR 64 | Resp 12 | Ht 72.0 in | Wt 273.0 lb

## 2017-09-20 DIAGNOSIS — I10 Essential (primary) hypertension: Secondary | ICD-10-CM | POA: Diagnosis not present

## 2017-09-20 DIAGNOSIS — Q2733 Arteriovenous malformation of digestive system vessel: Secondary | ICD-10-CM | POA: Diagnosis not present

## 2017-09-20 DIAGNOSIS — D500 Iron deficiency anemia secondary to blood loss (chronic): Secondary | ICD-10-CM

## 2017-09-20 DIAGNOSIS — I2510 Atherosclerotic heart disease of native coronary artery without angina pectoris: Secondary | ICD-10-CM

## 2017-09-20 DIAGNOSIS — I5032 Chronic diastolic (congestive) heart failure: Secondary | ICD-10-CM | POA: Diagnosis not present

## 2017-09-20 DIAGNOSIS — I480 Paroxysmal atrial fibrillation: Secondary | ICD-10-CM | POA: Diagnosis not present

## 2017-09-20 DIAGNOSIS — Z950 Presence of cardiac pacemaker: Secondary | ICD-10-CM

## 2017-09-20 DIAGNOSIS — K922 Gastrointestinal hemorrhage, unspecified: Secondary | ICD-10-CM

## 2017-09-20 HISTORY — DX: Chronic diastolic (congestive) heart failure: I50.32

## 2017-09-20 NOTE — Progress Notes (Signed)
Cardiology Office Note:    Date:  09/20/2017   ID:  Lawrence [name], DOB [birth year], MRN [id number]  PCP:  [name], MD  Cardiologist:  [name], MD    Referring MD: [name], MD   Chief Complaint  Patient presents with  . 1 week follow up  Doing better  History of Present Illness:    Lawrence [name] is a 71 y.o. male  With multiple medical problems, I saw him week ago with CC of SOB. He was in decompensated CHF. Diuresis was increased and he is much better. Echo was done looking for comlication of PPM implant. There was no complication. BNP was elevated. Chem 7 was followed, Creat increased, will recheck today and for now continue with present dose of meds. He is schedule to see hematologist today. I will see him back in 2 weeks  Past Medical History:  Diagnosis Date  . Anemia   . Chronic airway obstruction, not elsewhere classified   . Chronic ischemic heart disease, unspecified   . Coronary artery disease   . Diabetes mellitus    type II, neuropathy,   . Diabetes mellitus without complication ([place])   . Essential hypertension, benign   . Hyperlipidemia   . Hyperlipidemia, mixed   . Hypertension   . Nodule of kidney    incidentally found on CT abdomen 11/2012.  Pending Urology evaluation.   . Nontoxic multinodular goiter   . Obesity   . Other testicular hypofunction   . PUD (peptic ulcer disease)   . Renal insufficiency, mild    hx of    Past Surgical History:  Procedure Laterality Date  . CHOLECYSTECTOMY    . CORONARY ARTERY BYPASS GRAFT  2007   x 4  . CORONARY ARTERY BYPASS GRAFT    . ESOPHAGOGASTRODUODENOSCOPY     10/06/2012:  hiatal hernia, moderate gastritis.  2012: Colonoscopy at [facility]:  one polyp.  Due next 2017  . EYE SURGERY    . KNEE DEBRIDEMENT    . PACEMAKER IMPLANT N/A 08/22/2017   Procedure: Pacemaker Implant;  Surgeon: Constance Haw, MD;  Location: [place];  Service: Cardiovascular;  Laterality: N/A;  .  SHOULDER SURGERY     left shoulder  . SHOULDER SURGERY    . TONSILECTOMY, ADENOIDECTOMY, BILATERAL MYRINGOTOMY AND TUBES      Current Medications: Current Meds  Medication Sig  . amiodarone (PACERONE) 400 MG tablet Take 1 tablet (400 mg total) by mouth 2 (two) times daily.  Marland Kitchen apixaban (ELIQUIS) 5 MG TABS tablet Take 5 mg by mouth 2 (two) times daily.  . febuxostat (ULORIC) 40 MG tablet Take 40 mg by mouth daily.  . furosemide (LASIX) 20 MG tablet Take 2 tablets (40 mg total) by mouth daily.  Marland Kitchen gabapentin (NEURONTIN) 600 MG tablet Take 600 mg by mouth 2 (two) times daily.   Marland Kitchen glimepiride (AMARYL) 4 MG tablet Take 4 mg by mouth 2 (two) times daily.   . Ipratropium-Albuterol (COMBIVENT RESPIMAT) 20-100 MCG/ACT AERS respimat Inhale 1 puff into the lungs daily as needed for wheezing.  Marland Kitchen ipratropium-albuterol (DUONEB) 0.5-2.5 (3) MG/3ML SOLN Take 3 mLs by nebulization as needed.  . linagliptin (TRADJENTA) 5 MG TABS tablet Take 5 mg by mouth daily.  Marland Kitchen liraglutide (VICTOZA) 18 MG/3ML SOPN Inject 1.8 mg into the skin daily.   . nebivolol (BYSTOLIC) 5 MG tablet Take 5 mg by mouth daily.  . nitroGLYCERIN (NITROSTAT) 0.4 MG SL tablet Place  0.4 mg under the tongue.  . pantoprazole (PROTONIX) 40 MG tablet Take 40 mg by mouth daily.   . simvastatin (ZOCOR) 20 MG tablet Take 20 mg by mouth at bedtime.      Allergies:   Bactrim [sulfamethoxazole-trimethoprim]   Social History   Social History  . Marital status: Married    Spouse name: N/A  . Number of children: 5  . Years of education: N/A   Occupational History  . Retired     Medical illustrator; retired Chiropodist   Social History Main Topics  . Smoking status: Former Smoker    Packs/day: 1.50    Years: 30.00    Types: Cigarettes  . Smokeless tobacco: Former Systems developer    Types: Chew  . Alcohol use Yes  . Drug use: No  . Sexual activity: Not Asked   Other Topics Concern  . None   Social History Narrative   ** Merged History  Encounter **       Lives with wife in a one story home.  Has 6 children.  Retired Nature conservation officer.  Education: college.     Family History: The patient's family history includes Asthma in his sister; Cancer in his father and mother; Colon cancer in his mother; Emphysema in his unknown relative; Esophageal cancer in his father; Hypertension in his mother; Kidney cancer in his mother; Stroke in his father. ROS:   Please see the history of present illness.    All 14 point review of systems negative except as described per history of present illness  EKGs/Labs/Other Studies Reviewed:      Recent Labs: 08/22/2017: TSH 0.585 09/13/2017: ALT 13; BNP 614.3; Hemoglobin 10.4; Platelets 221 09/17/2017: BUN 38; Creatinine, Ser 2.49; NT-Pro BNP 6,365; Potassium 4.8; Sodium 141  Recent Lipid Panel No results found for: CHOL, TRIG, HDL, CHOLHDL, VLDL, LDLCALC, LDLDIRECT  Physical Exam:    VS:  BP 130/74   Pulse 64   Resp 12   Ht 6' (1.829 m)   Wt 273 lb (123.8 kg)   BMI 37.03 kg/m     Wt Readings from Last 3 Encounters:  09/20/17 273 lb (123.8 kg)  09/13/17 272 lb (123.4 kg)  08/23/17 257 lb 15 oz (117 kg)     GEN:  Well nourished, well developed in no acute distress HEENT: Normal NECK: No JVD; No carotid bruits LYMPHATICS: No lymphadenopathy CARDIAC: RRR, no murmurs, no rubs, no gallops RESPIRATORY:  Clear to auscultation without rales, wheezing or rhonchi  ABDOMEN: Soft, non-tender, non-distended MUSCULOSKELETAL:  No edema; No deformity  SKIN: Warm and dry LOWER EXTREMITIES: no swelling NEUROLOGIC:  Alert and oriented x 3 PSYCHIATRIC:  Normal affect   ASSESSMENT:    1. Coronary artery disease involving native coronary artery of native heart without angina pectoris   2. Essential hypertension, benign   3. Paroxysmal atrial fibrillation (HCC)   4. Pacemaker   5. CHF (congestive heart failure), NYHA class II, chronic, diastolic (HCC)    PLAN:    In order of problems listed  above:  1. CAD - stable, no chest pain 2. HTN - BP well controlled, will cont same 3. PAF - in NSR,  4. Pacemaker - no complication after implant, wound healed. 5. CHF - improving, will watch creat and BNP.   Medication Adjustments/Labs and Tests Ordered: Current medicines are reviewed at length with the patient today.  Concerns regarding medicines are outlined above.  No orders of the defined types were placed in this encounter.  Medication  changes: No orders of the defined types were placed in this encounter.   Signed, Park Liter, MD, [facility] 09/20/2017 11:43 AM    [place]

## 2017-09-20 NOTE — Patient Instructions (Signed)
Medication Instructions:  Your physician recommends that you continue on your current medications as directed. Please refer to the Current Medication list given to you today.  Labwork: Your physician recommends that you have lab work today to check your fluid level and Kidney function as well as sodium level and potassium.   Testing/Procedures: None   Follow-Up: Your physician recommends that you schedule a follow-up appointment in: 2 weeks  Any Other Special Instructions Will Be Listed Below (If Applicable).  Please note that any paperwork needing to be filled out by the provider will need to be addressed at the front desk prior to seeing the provider. Please note that any paperwork FMLA, Disability or other documents regarding health condition is subject to a $25.00 charge that must be received prior to completion of paperwork in the form of a money order or check.    If you need a refill on your cardiac medications before your next appointment, please call your pharmacy.

## 2017-09-23 DIAGNOSIS — N189 Chronic kidney disease, unspecified: Secondary | ICD-10-CM | POA: Diagnosis not present

## 2017-09-25 DIAGNOSIS — K922 Gastrointestinal hemorrhage, unspecified: Secondary | ICD-10-CM | POA: Diagnosis not present

## 2017-09-25 DIAGNOSIS — E1129 Type 2 diabetes mellitus with other diabetic kidney complication: Secondary | ICD-10-CM | POA: Diagnosis not present

## 2017-09-25 DIAGNOSIS — R809 Proteinuria, unspecified: Secondary | ICD-10-CM | POA: Diagnosis not present

## 2017-09-25 DIAGNOSIS — I480 Paroxysmal atrial fibrillation: Secondary | ICD-10-CM | POA: Diagnosis not present

## 2017-09-25 DIAGNOSIS — D631 Anemia in chronic kidney disease: Secondary | ICD-10-CM | POA: Diagnosis not present

## 2017-09-25 DIAGNOSIS — N183 Chronic kidney disease, stage 3 (moderate): Secondary | ICD-10-CM | POA: Diagnosis not present

## 2017-09-25 DIAGNOSIS — I129 Hypertensive chronic kidney disease with stage 1 through stage 4 chronic kidney disease, or unspecified chronic kidney disease: Secondary | ICD-10-CM | POA: Diagnosis not present

## 2017-09-25 DIAGNOSIS — N179 Acute kidney failure, unspecified: Secondary | ICD-10-CM | POA: Diagnosis not present

## 2017-09-25 DIAGNOSIS — Z7901 Long term (current) use of anticoagulants: Secondary | ICD-10-CM | POA: Diagnosis not present

## 2017-09-25 DIAGNOSIS — Z950 Presence of cardiac pacemaker: Secondary | ICD-10-CM | POA: Diagnosis not present

## 2017-09-27 ENCOUNTER — Other Ambulatory Visit: Payer: Self-pay | Admitting: Cardiology

## 2017-09-27 DIAGNOSIS — D500 Iron deficiency anemia secondary to blood loss (chronic): Secondary | ICD-10-CM | POA: Diagnosis not present

## 2017-10-02 ENCOUNTER — Encounter: Payer: Self-pay | Admitting: Cardiology

## 2017-10-02 ENCOUNTER — Ambulatory Visit (INDEPENDENT_AMBULATORY_CARE_PROVIDER_SITE_OTHER): Payer: Medicare Other | Admitting: Cardiology

## 2017-10-02 VITALS — BP 140/68 | HR 60 | Resp 10 | Ht 72.0 in | Wt 271.0 lb

## 2017-10-02 DIAGNOSIS — I2510 Atherosclerotic heart disease of native coronary artery without angina pectoris: Secondary | ICD-10-CM

## 2017-10-02 DIAGNOSIS — Z950 Presence of cardiac pacemaker: Secondary | ICD-10-CM | POA: Diagnosis not present

## 2017-10-02 DIAGNOSIS — I480 Paroxysmal atrial fibrillation: Secondary | ICD-10-CM

## 2017-10-02 DIAGNOSIS — I10 Essential (primary) hypertension: Secondary | ICD-10-CM

## 2017-10-02 NOTE — Patient Instructions (Signed)
Medication Instructions:  Your physician recommends that you continue on your current medications as directed. Please refer to the Current Medication list given to you today.  Labwork: None   Testing/Procedures: None   Follow-Up: Your physician recommends that you schedule a follow-up appointment in: 1 month   Any Other Special Instructions Will Be Listed Below (If Applicable).  Please note that any paperwork needing to be filled out by the provider will need to be addressed at the front desk prior to seeing the provider. Please note that any paperwork FMLA, Disability or other documents regarding health condition is subject to a $25.00 charge that must be received prior to completion of paperwork in the form of a money order or check.     If you need a refill on your cardiac medications before your next appointment, please call your pharmacy.  

## 2017-10-02 NOTE — Progress Notes (Signed)
Cardiology Office Note:    Date:  10/02/2017   ID:  Lawrence [name], DOB 02-Dec-1946, MRN [id number]  PCP:  [name], MD  Cardiologist:  [name], MD    Referring MD: [name], MD   Chief Complaint  Patient presents with  . 2 week follow up  feeling better  History of Present Illness:    Lawrence [name] is a 71 y.o. male  with a diastolic decompensated congestive heart therapy. Gradually improving with diuresis. We have to be careful because of kidney function but seems to be stable now. He still losing weight he lost 3 pounds since last time. I advised him to continue present medications. He is going [facility] a trip [facility] I told him to be careful with food. I give him again instruction about low salt diet as well as avoiding excessive fluids. Also instructed him to call me if he runs into trouble.  Past Medical History:  Diagnosis Date  . Anemia   . Chronic airway obstruction, not elsewhere classified   . Chronic ischemic heart disease, unspecified   . Coronary artery disease   . Diabetes mellitus    type II, neuropathy,   . Diabetes mellitus without complication ([place])   . Essential hypertension, benign   . Hyperlipidemia   . Hyperlipidemia, mixed   . Hypertension   . Nodule of kidney    incidentally found on CT abdomen 11/2012.  Pending Urology evaluation.   . Nontoxic multinodular goiter   . Obesity   . Other testicular hypofunction   . PUD (peptic ulcer disease)   . Renal insufficiency, mild    hx of    Past Surgical History:  Procedure Laterality Date  . CHOLECYSTECTOMY    . CORONARY ARTERY BYPASS GRAFT  2007   x 4  . CORONARY ARTERY BYPASS GRAFT    . ESOPHAGOGASTRODUODENOSCOPY     10/06/2012:  hiatal hernia, moderate gastritis.  2012: Colonoscopy at [facility]:  one polyp.  Due next 2017  . EYE SURGERY    . KNEE DEBRIDEMENT    . PACEMAKER IMPLANT N/A 08/22/2017   Procedure: Pacemaker Implant;  Surgeon: Constance Haw, MD;  Location:  [place] [facility];  Service: Cardiovascular;  Laterality: N/A;  . SHOULDER SURGERY     left shoulder  . SHOULDER SURGERY    . TONSILECTOMY, ADENOIDECTOMY, BILATERAL MYRINGOTOMY AND TUBES      Current Medications: Current Meds  Medication Sig  . amiodarone (PACERONE) 400 MG tablet Take 1 tablet (400 mg total) by mouth 2 (two) times daily. (Patient taking differently: Take 200 mg by mouth 2 (two) times daily. )  . apixaban (ELIQUIS) 5 MG TABS tablet Take 5 mg by mouth 2 (two) times daily.  . febuxostat (ULORIC) 40 MG tablet Take 40 mg by mouth daily.  . furosemide (LASIX) 20 MG tablet Take 2 tablets (40 mg total) by mouth daily.  Marland Kitchen gabapentin (NEURONTIN) 600 MG tablet Take 600 mg by mouth 2 (two) times daily.   Marland Kitchen glimepiride (AMARYL) 4 MG tablet Take 4 mg by mouth 2 (two) times daily.   . insulin glargine (LANTUS) 100 UNIT/ML injection Inject 6 Units into the skin at bedtime.  . Ipratropium-Albuterol (COMBIVENT RESPIMAT) 20-100 MCG/ACT AERS respimat Inhale 1 puff into the lungs daily as needed for wheezing.  Marland Kitchen ipratropium-albuterol (DUONEB) 0.5-2.5 (3) MG/3ML SOLN Take 3 mLs by nebulization as needed.  . linagliptin (TRADJENTA) 5 MG TABS tablet Take 5  mg by mouth daily.  . nebivolol (BYSTOLIC) 5 MG tablet Take 5 mg by mouth daily.  . nitroGLYCERIN (NITROSTAT) 0.4 MG SL tablet Place 0.4 mg under the tongue.  . pantoprazole (PROTONIX) 40 MG tablet Take 40 mg by mouth daily.   . simvastatin (ZOCOR) 20 MG tablet Take 20 mg by mouth at bedtime.      Allergies:   Bactrim [sulfamethoxazole-trimethoprim]   Social History   Social History  . Marital status: Married    Spouse name: N/A  . Number of children: 5  . Years of education: N/A   Occupational History  . Retired     Medical illustrator; retired Chiropodist   Social History Main Topics  . Smoking status: Former Smoker    Packs/day: 1.50    Years: 30.00    Types: Cigarettes  . Smokeless tobacco: Former Systems developer    Types: Chew    . Alcohol use Yes  . Drug use: No  . Sexual activity: Not Asked   Other Topics Concern  . None   Social History Narrative   ** Merged History Encounter **       Lives with wife in a one story home.  Has 6 children.  Retired Nature conservation officer.  Education: college.     Family History: The patient's family history includes Asthma in his sister; Cancer in his father and mother; Colon cancer in his mother; Emphysema in his unknown relative; Esophageal cancer in his father; Hypertension in his mother; Kidney cancer in his mother; Stroke in his father. ROS:   Please see the history of present illness.    All 14 point review of systems negative except as described per history of present illness  EKGs/Labs/Other Studies Reviewed:      Recent Labs: 08/22/2017: TSH 0.585 09/13/2017: ALT 13; BNP 614.3; Hemoglobin 10.4; Platelets 221 09/17/2017: BUN 38; Creatinine, Ser 2.49; NT-Pro BNP 6,365; Potassium 4.8; Sodium 141  Recent Lipid Panel No results found for: CHOL, TRIG, HDL, CHOLHDL, VLDL, LDLCALC, LDLDIRECT  Physical Exam:    VS:  BP 140/68   Pulse 60   Resp 10   Ht 6' (1.829 m)   Wt 271 lb (122.9 kg)   BMI 36.75 kg/m     Wt Readings from Last 3 Encounters:  10/02/17 271 lb (122.9 kg)  09/20/17 273 lb (123.8 kg)  09/13/17 272 lb (123.4 kg)     GEN:  Well nourished, well developed in no acute distress HEENT: Normal NECK: No JVD; No carotid bruits LYMPHATICS: No lymphadenopathy CARDIAC: RRR, no murmurs, no rubs, no gallops RESPIRATORY:  Clear to auscultation without rales, wheezing or rhonchi  ABDOMEN: Soft, non-tender, non-distended MUSCULOSKELETAL:  No edema; No deformity  SKIN: Warm and dry LOWER EXTREMITIES: no swelling NEUROLOGIC:  Alert and oriented x 3 PSYCHIATRIC:  Normal affect   ASSESSMENT:    1. Coronary artery disease involving native coronary artery of native heart without angina pectoris   2. Paroxysmal atrial fibrillation (HCC)   3. Essential hypertension,  benign   4. Pacemaker    PLAN:    In order of problems listed above:  1. Coronary artery disease: Stable doing well from that point of the would continue present management. 2. Paroxysmal atrial fibrillation: Anticoagulated which I will continue. Recently converted to sinus rhythm. Essential hypertension: Blood pressure will control continue present management. 3. Pacemaker: Recent implant function normal  Overall discharge is improving. I will see him back in one month or sooner if he has a problem  Medication Adjustments/Labs and Tests Ordered: Current medicines are reviewed at length with the patient today.  Concerns regarding medicines are outlined above.  No orders of the defined types were placed in this encounter.  Medication changes: No orders of the defined types were placed in this encounter.   Signed, Park Liter, MD, [facility] 10/02/2017 11:05 AM    [place]

## 2017-10-16 DIAGNOSIS — S20211A Contusion of right front wall of thorax, initial encounter: Secondary | ICD-10-CM | POA: Diagnosis not present

## 2017-10-16 DIAGNOSIS — S63501A Unspecified sprain of right wrist, initial encounter: Secondary | ICD-10-CM | POA: Diagnosis not present

## 2017-10-16 DIAGNOSIS — Z6841 Body Mass Index (BMI) 40.0 and over, adult: Secondary | ICD-10-CM | POA: Diagnosis not present

## 2017-10-21 DIAGNOSIS — K922 Gastrointestinal hemorrhage, unspecified: Secondary | ICD-10-CM | POA: Diagnosis not present

## 2017-10-21 DIAGNOSIS — I509 Heart failure, unspecified: Secondary | ICD-10-CM | POA: Diagnosis not present

## 2017-10-21 DIAGNOSIS — D500 Iron deficiency anemia secondary to blood loss (chronic): Secondary | ICD-10-CM | POA: Diagnosis not present

## 2017-10-21 DIAGNOSIS — D509 Iron deficiency anemia, unspecified: Secondary | ICD-10-CM | POA: Diagnosis not present

## 2017-10-25 DIAGNOSIS — D638 Anemia in other chronic diseases classified elsewhere: Secondary | ICD-10-CM | POA: Diagnosis not present

## 2017-10-25 DIAGNOSIS — R5383 Other fatigue: Secondary | ICD-10-CM | POA: Diagnosis not present

## 2017-10-25 DIAGNOSIS — R4702 Dysphasia: Secondary | ICD-10-CM | POA: Diagnosis not present

## 2017-10-25 DIAGNOSIS — Z6841 Body Mass Index (BMI) 40.0 and over, adult: Secondary | ICD-10-CM | POA: Diagnosis not present

## 2017-10-29 DIAGNOSIS — R42 Dizziness and giddiness: Secondary | ICD-10-CM | POA: Diagnosis not present

## 2017-10-29 DIAGNOSIS — R4702 Dysphasia: Secondary | ICD-10-CM | POA: Diagnosis not present

## 2017-10-29 DIAGNOSIS — R531 Weakness: Secondary | ICD-10-CM | POA: Diagnosis not present

## 2017-11-04 ENCOUNTER — Ambulatory Visit (INDEPENDENT_AMBULATORY_CARE_PROVIDER_SITE_OTHER): Payer: Medicare Other | Admitting: Cardiology

## 2017-11-04 ENCOUNTER — Encounter: Payer: Self-pay | Admitting: Cardiology

## 2017-11-04 VITALS — BP 120/58 | HR 68 | Resp 12 | Ht 72.0 in | Wt 267.8 lb

## 2017-11-04 DIAGNOSIS — D649 Anemia, unspecified: Secondary | ICD-10-CM | POA: Diagnosis not present

## 2017-11-04 DIAGNOSIS — I480 Paroxysmal atrial fibrillation: Secondary | ICD-10-CM | POA: Diagnosis not present

## 2017-11-04 DIAGNOSIS — I10 Essential (primary) hypertension: Secondary | ICD-10-CM | POA: Diagnosis not present

## 2017-11-04 DIAGNOSIS — I2510 Atherosclerotic heart disease of native coronary artery without angina pectoris: Secondary | ICD-10-CM

## 2017-11-04 DIAGNOSIS — Z950 Presence of cardiac pacemaker: Secondary | ICD-10-CM

## 2017-11-04 DIAGNOSIS — I5032 Chronic diastolic (congestive) heart failure: Secondary | ICD-10-CM

## 2017-11-04 MED ORDER — AMIODARONE HCL 200 MG PO TABS: 200.0000 mg | ORAL_TABLET | Freq: Every day | ORAL | 3 refills | Status: DC

## 2017-11-04 NOTE — Patient Instructions (Addendum)
Medication Instructions:  Your physician has recommended you make the following change in your medication:  1.) DECREASE Amiodarone to 200 mg daily.   Labwork: Your physician recommends that you have lab work today to check your fluid congestion level and Kidney function as well as sodium level and potassium.   Testing/Procedures: EKG today in office.   Follow-Up: Your physician recommends that you schedule a follow-up appointment in: 3 months   Any Other Special Instructions Will Be Listed Below (If Applicable).  Please note that any paperwork needing to be filled out by the provider will need to be addressed at the front desk prior to seeing the provider. Please note that any paperwork FMLA, Disability or other documents regarding health condition is subject to a $25.00 charge that must be received prior to completion of paperwork in the form of a money order or check.    If you need a refill on your cardiac medications before your next appointment, please call your pharmacy. `

## 2017-11-04 NOTE — Progress Notes (Signed)
Cardiology Office Note:    Date:  11/04/2017   ID:  Lawrence [name], DOB 06-14-1946, MRN [id number]  PCP:  [name], MD  Cardiologist:  [name], MD    Referring MD: [name], MD   Chief Complaint  Patient presents with  . 1 month follow up  Doing better  History of Present Illness:    Lawrence [name] is a 71 y.o. male with congestive heart failure doing better he went to [facility] was weak but was able to handle the effort.  Described to have some swelling of lower extremities, he urinates during the night but overall he said he is getting better stronger.  Apparently his last creatinine was 1.8.  Denies having any chest pain tightness squeezing pressure burning chest no palpitations.  Past Medical History:  Diagnosis Date  . Anemia   . Chronic airway obstruction, not elsewhere classified   . Chronic ischemic heart disease, unspecified   . Coronary artery disease   . Diabetes mellitus    type II, neuropathy,   . Diabetes mellitus without complication ([place])   . Essential hypertension, benign   . Hyperlipidemia   . Hyperlipidemia, mixed   . Hypertension   . Nodule of kidney    incidentally found on CT abdomen 11/2012.  Pending Urology evaluation.   . Nontoxic multinodular goiter   . Obesity   . Other testicular hypofunction   . PUD (peptic ulcer disease)   . Renal insufficiency, mild    hx of    Past Surgical History:  Procedure Laterality Date  . CHOLECYSTECTOMY    . CORONARY ARTERY BYPASS GRAFT  2007   x 4  . CORONARY ARTERY BYPASS GRAFT    . ESOPHAGOGASTRODUODENOSCOPY     10/06/2012:  hiatal hernia, moderate gastritis.  2012: Colonoscopy at [facility]:  one polyp.  Due next 2017  . EYE SURGERY    . KNEE DEBRIDEMENT    . SHOULDER SURGERY     left shoulder  . SHOULDER SURGERY    . TONSILECTOMY, ADENOIDECTOMY, BILATERAL MYRINGOTOMY AND TUBES      Current Medications: Current Meds  Medication Sig  . amiodarone  (PACERONE) 400 MG tablet Take 1 tablet (400 mg total) by mouth 2 (two) times daily. (Patient taking differently: Take 200 mg by mouth 2 (two) times daily. )  . apixaban (ELIQUIS) 5 MG TABS tablet Take 5 mg by mouth 2 (two) times daily.  . febuxostat (ULORIC) 40 MG tablet Take 40 mg by mouth daily.  . furosemide (LASIX) 20 MG tablet Take 2 tablets (40 mg total) by mouth daily.  Marland Kitchen gabapentin (NEURONTIN) 600 MG tablet Take 600 mg by mouth 2 (two) times daily.   Marland Kitchen glimepiride (AMARYL) 4 MG tablet Take 4 mg by mouth 2 (two) times daily.   . insulin glargine (LANTUS) 100 UNIT/ML injection Inject 6 Units into the skin at bedtime.  . Ipratropium-Albuterol (COMBIVENT RESPIMAT) 20-100 MCG/ACT AERS respimat Inhale 1 puff into the lungs daily as needed for wheezing.  Marland Kitchen ipratropium-albuterol (DUONEB) 0.5-2.5 (3) MG/3ML SOLN Take 3 mLs by nebulization as needed.  . linagliptin (TRADJENTA) 5 MG TABS tablet Take 5 mg by mouth daily.  . nebivolol (BYSTOLIC) 5 MG tablet Take 5 mg by mouth daily.  . nitroGLYCERIN (NITROSTAT) 0.4 MG SL tablet Place 0.4 mg under the tongue.  . pantoprazole (PROTONIX) 40 MG tablet Take 40 mg by mouth daily.   . simvastatin (ZOCOR) 20 MG  tablet Take 20 mg by mouth at bedtime.      Allergies:   Bactrim [sulfamethoxazole-trimethoprim]   Social History   Socioeconomic History  . Marital status: Married    Spouse name: None  . Number of children: 5  . Years of education: None  . Highest education level: None  Social Needs  . Financial resource strain: None  . Food insecurity - worry: None  . Food insecurity - inability: None  . Transportation needs - medical: None  . Transportation needs - non-medical: None  Occupational History  . Occupation: Retired    Comment: Medical illustrator; retired Chiropodist  Tobacco Use  . Smoking status: Former Smoker    Packs/day: 1.50    Years: 30.00    Pack years: 45.00    Types: Cigarettes  . Smokeless tobacco: Former Systems developer     Types: Chew  Substance and Sexual Activity  . Alcohol use: Yes  . Drug use: No  . Sexual activity: None  Other Topics Concern  . None  Social History Narrative   ** Merged History Encounter **       Lives with wife in a one story home.  Has 6 children.  Retired Nature conservation officer.  Education: college.     Family History: The patient's family history includes Asthma in his sister; Cancer in his father and mother; Colon cancer in his mother; Emphysema in his unknown relative; Esophageal cancer in his father; Hypertension in his mother; Kidney cancer in his mother; Stroke in his father. ROS:   Please see the history of present illness.    All 14 point review of systems negative except as described per history of present illness  EKGs/Labs/Other Studies Reviewed:      Recent Labs: 08/22/2017: TSH 0.585 09/13/2017: ALT 13; BNP 614.3; Hemoglobin 10.4; Platelets 221 09/17/2017: BUN 38; Creatinine, Ser 2.49; NT-Pro BNP 6,365; Potassium 4.8; Sodium 141  Recent Lipid Panel No results found for: CHOL, TRIG, HDL, CHOLHDL, VLDL, LDLCALC, LDLDIRECT  Physical Exam:    VS:  BP (!) 120/58   Pulse 68   Resp 12   Ht 6' (1.829 m)   Wt 267 lb 12.8 oz (121.5 kg)   BMI 36.32 kg/m     Wt Readings from Last 3 Encounters:  11/04/17 267 lb 12.8 oz (121.5 kg)  10/02/17 271 lb (122.9 kg)  09/20/17 273 lb (123.8 kg)     GEN:  Well nourished, well developed in no acute distress HEENT: Normal NECK: No JVD; No carotid bruits LYMPHATICS: No lymphadenopathy CARDIAC: RRR, no murmurs, no rubs, no gallops RESPIRATORY:  Clear to auscultation without rales, wheezing or rhonchi  ABDOMEN: Soft, non-tender, non-distended MUSCULOSKELETAL:  No edema; No deformity  SKIN: Warm and dry LOWER EXTREMITIES: no swelling NEUROLOGIC:  Alert and oriented x 3 PSYCHIATRIC:  Normal affect   ASSESSMENT:    1. CHF (congestive heart failure), NYHA class II, chronic, diastolic ([place])   2. Coronary artery disease involving native  coronary artery of native heart without angina pectoris   3. Essential hypertension, benign   4. Paroxysmal atrial fibrillation (HCC)   5. Pacemaker   6. Anemia, unspecified type    PLAN:    In order of problems listed above:  1. Congestive heart failure: Appears to be stable compensated with only mild swelling of lower extremities I think he still can benefit from a slightly higher dose of diuretic.  I will ask her to have Chem-7 and proBNP today. 2. Coronary artery disease: Stable asymptomatic.  3. Essential hypertension: Blood pressure well controlled continue present management. 4. Pacemaker: Functioning properly we will continue conservative approach. 5. Kidney dysfunction: Chem-7 will be checked today. 6. Paroxysmal atrial fibrillation: I will ask to have EKG if he shows normal sinus rhythm we will Down amiodarone to 200 mg daily   Medication Adjustments/Labs and Tests Ordered: Current medicines are reviewed at length with the patient today.  Concerns regarding medicines are outlined above.  No orders of the defined types were placed in this encounter.  Medication changes: No orders of the defined types were placed in this encounter.   Signed, Park Liter, MD, [facility] 11/04/2017 10:46 AM    [place]

## 2017-11-05 LAB — BASIC METABOLIC PANEL
BUN/Creatinine Ratio: 23 (ref 10–24)
BUN: 52 mg/dL — ABNORMAL HIGH (ref 8–27)
CHLORIDE: 98 mmol/L (ref 96–106)
CO2: 23 mmol/L (ref 20–29)
Calcium: 9.1 mg/dL (ref 8.6–10.2)
Creatinine, Ser: 2.3 mg/dL — ABNORMAL HIGH (ref 0.76–1.27)
GFR calc Af Amer: 32 mL/min/{1.73_m2} — ABNORMAL LOW (ref >59)
GFR, EST NON AFRICAN AMERICAN: 27 mL/min/{1.73_m2} — AB (ref >59)
GLUCOSE: 108 mg/dL — AB (ref 65–99)
POTASSIUM: 4.5 mmol/L (ref 3.5–5.2)
SODIUM: 144 mmol/L (ref 134–144)

## 2017-11-05 LAB — PRO B NATRIURETIC PEPTIDE: NT-PRO BNP: 6012 pg/mL — AB (ref 0–376)

## 2017-11-11 MED ORDER — AMIODARONE HCL 200 MG PO TABS: 200.0000 mg | ORAL_TABLET | Freq: Every day | ORAL | 3 refills | Status: DC

## 2017-11-22 ENCOUNTER — Encounter (INDEPENDENT_AMBULATORY_CARE_PROVIDER_SITE_OTHER): Payer: Self-pay

## 2017-11-26 NOTE — Progress Notes (Signed)
Electrophysiology Office Note   Date:  11/27/2017   ID:  Lawrence [name], DOB Jun 03, 1946, MRN [id number]  PCP:  [name], MD  Cardiologist:  [name] Primary Electrophysiologist:  Tex Conroy Meredith Leeds, MD    Chief Complaint  Patient presents with  . Pacemaker Check    Bradycardia/PAF     History of Present Illness: Lawrence [name] is a 71 y.o. male who is being seen today for the evaluation of sick sinus syndrome at the request of [name], MD. Presenting today for electrophysiology evaluation.  He has a history of coronary artery disease, paroxysmal atrial fibrillation, hypertension, hyperlipidemia, and chronic renal failure.  He presented to the hospital  in October 2018 and had a [place] dual-chamber pacemaker implanted 08/22/17.    Today, he denies symptoms of palpitations, chest pain, claudication, dizziness, presyncope, syncope, bleeding, or neurologic sequela. The patient is tolerating medications without difficulties.  He has been having weakness, fatigue, and shortness of breath.  Device interrogation shows atrial tachycardia.  He is having difficulty with exertion with both lower extremity and abdominal swelling.  He usually wears a size 40 and has been wearing a size 44 most recently.   Past Medical History:  Diagnosis Date  . Anemia   . Chronic airway obstruction, not elsewhere classified   . Chronic ischemic heart disease, unspecified   . Coronary artery disease   . Diabetes mellitus    type II, neuropathy,   . Diabetes mellitus without complication ([place])   . Essential hypertension, benign   . Hyperlipidemia   . Hyperlipidemia, mixed   . Hypertension   . Nodule of kidney    incidentally found on CT abdomen 11/2012.  Pending Urology evaluation.   . Nontoxic multinodular goiter   . Obesity   . Other testicular hypofunction   . PUD (peptic ulcer disease)   . Renal insufficiency, mild    hx of   Past Surgical History:  Procedure  Laterality Date  . CHOLECYSTECTOMY    . CORONARY ARTERY BYPASS GRAFT  2007   x 4  . CORONARY ARTERY BYPASS GRAFT    . ESOPHAGOGASTRODUODENOSCOPY     10/06/2012:  hiatal hernia, moderate gastritis.  2012: Colonoscopy at [facility]:  one polyp.  Due next 2017  . EYE SURGERY    . KNEE DEBRIDEMENT    . PACEMAKER IMPLANT N/A 08/22/2017   Procedure: Pacemaker Implant;  Surgeon: Constance Haw, MD;  Location: [place] [facility];  Service: Cardiovascular;  Laterality: N/A;  . SHOULDER SURGERY     left shoulder  . SHOULDER SURGERY    . TONSILECTOMY, ADENOIDECTOMY, BILATERAL MYRINGOTOMY AND TUBES       Current Outpatient Medications  Medication Sig Dispense Refill  . amiodarone (PACERONE) 200 MG tablet Take 1 tablet (200 mg total) daily by mouth. 30 tablet 3  . apixaban (ELIQUIS) 5 MG TABS tablet Take 5 mg by mouth 2 (two) times daily.    Marland Kitchen glimepiride (AMARYL) 4 MG tablet Take 4 mg by mouth 2 (two) times daily.     . insulin glargine (LANTUS) 100 UNIT/ML injection Inject 6 Units into the skin at bedtime.    . Ipratropium-Albuterol (COMBIVENT RESPIMAT) 20-100 MCG/ACT AERS respimat Inhale 1 puff into the lungs daily as needed for wheezing.    Marland Kitchen ipratropium-albuterol (DUONEB) 0.5-2.5 (3) MG/3ML SOLN Take 3 mLs by nebulization as needed.    . linagliptin (TRADJENTA) 5 MG TABS tablet Take 5 mg by mouth daily.    Marland Kitchen  nebivolol (BYSTOLIC) 5 MG tablet Take 5 mg by mouth daily.    . nitroGLYCERIN (NITROSTAT) 0.4 MG SL tablet Place 0.4 mg under the tongue.    . pantoprazole (PROTONIX) 40 MG tablet Take 40 mg by mouth daily.     . simvastatin (ZOCOR) 20 MG tablet Take 20 mg by mouth at bedtime.      No current facility-administered medications for this visit.     Allergies:   Bactrim [sulfamethoxazole-trimethoprim]   Social History:  The patient  reports that he has quit smoking. His smoking use included cigarettes. He has a 45.00 pack-year smoking history. He has quit using smokeless tobacco. His  smokeless tobacco use included chew. He reports that he drinks alcohol. He reports that he does not use drugs.   Family History:  The patient's family history includes Asthma in his sister; Cancer in his father and mother; Colon cancer in his mother; Emphysema in his unknown relative; Esophageal cancer in his father; Hypertension in his mother; Kidney cancer in his mother; Stroke in his father.    ROS:  Please see the history of present illness.   Otherwise, review of systems is positive for change, fatigue, leg swelling, shortness of breath, chest pressure, facial swelling, hearing loss, visual change, cough, wheezing, balance problems, back pain, dizziness.   All other systems are reviewed and negative.    PHYSICAL EXAM: VS:  BP 120/82   Pulse (!) 117   Ht 6' (1.829 m)   Wt 275 lb (124.7 kg)   BMI 37.30 kg/m  , BMI Body mass index is 37.3 kg/m. GEN: Well nourished, well developed, in no acute distress  HEENT: normal  Neck: no JVD, carotid bruits, or masses Cardiac: RRR; no murmurs, rubs, or gallops,no edema  Respiratory:  clear to auscultation bilaterally, normal work of breathing GI: soft, nontender, nondistended, + BS MS: no deformity or atrophy  Skin: warm and dry, device pocket is well healed Neuro:  Strength and sensation are intact Psych: euthymic mood, full affect  EKG:  EKG is ordered today. Personal review of the ekg ordered shows ventricular pacing, rate 117  Device interrogation is reviewed today in detail.  See PaceArt for details.   Recent Labs: 08/22/2017: TSH 0.585 09/13/2017: ALT 13; BNP 614.3; Hemoglobin 10.4; Platelets 221 11/04/2017: BUN 52; Creatinine, Ser 2.30; NT-Pro BNP 6,012; Potassium 4.5; Sodium 144    Lipid Panel  No results found for: CHOL, TRIG, HDL, CHOLHDL, VLDL, LDLCALC, LDLDIRECT   Wt Readings from Last 3 Encounters:  11/27/17 275 lb (124.7 kg)  11/04/17 267 lb 12.8 oz (121.5 kg)  10/02/17 271 lb (122.9 kg)      Other studies  Reviewed: Additional studies/ records that were reviewed today include: TTE 09/17/17  Review of the above records today demonstrates:  - Left ventricle: The cavity size was mildly dilated. Wall   thickness was normal. Systolic function was normal. The estimated   ejection fraction was in the range of 55% to 60%. Regional wall   motion abnormalities cannot be excluded. The study is not   technically sufficient to allow evaluation of LV diastolic   function. - Mitral valve: Calcified annulus. There was mild regurgitation. - Right ventricle: The cavity size was moderately dilated. - Tricuspid valve: There was moderate regurgitation. - Pulmonary arteries: PA peak pressure: 32 mm Hg (S).   ASSESSMENT AND PLAN:  1.  Sick sinus syndrome: Status post [employer] dual-chamber pacemaker.  Device is functioning appropriately.  No major adjustments today.  2.  Paroxysmal atrial fibrillation: Early on amiodarone and Eliquis.  No atrial fibrillation noted on device. This patients CHA2DS2-VASc Score and unadjusted Ischemic Stroke Rate (% per year) is equal to 4.8 % stroke rate/year from a score of 4  Above score calculated as 1 point each if present [CHF, HTN, DM, Vascular=MI/PAD/Aortic Plaque, Age if 65-74, or Male] Above score calculated as 2 points each if present [Age > 75, or Stroke/TIA/TE]  3.  Coronary artery disease: No current chest pain  4.  Hypertension: Blood pressure well controlled  5.  Hyperlipidemia: Continue statin  6.  Atrial tachycardia: Patient presented to clinic in atrial tachycardia, with a cycle length of 520 ms.  Through his pacemaker, pacing was performed at a cycle length of 350 ms, which got him out of tachycardia and back to normal rhythm.  He is currently AV pacing at a rate of 60.   Current medicines are reviewed at length with the patient today.   The patient does not have concerns regarding his medicines.  The following changes were made today:  none  Labs/  tests ordered today include:  Orders Placed This Encounter  Procedures  . EKG 12-Lead     Disposition:   FU with Bhumi Godbey 6 months  Signed, Mariafernanda Hendricksen Meredith Leeds, MD  11/27/2017 11:21 AM     [facility] [address] [place] [address] [phone] (office) [phone] (fax)

## 2017-11-27 ENCOUNTER — Telehealth: Payer: Self-pay | Admitting: Cardiology

## 2017-11-27 ENCOUNTER — Encounter: Payer: Self-pay | Admitting: Cardiology

## 2017-11-27 ENCOUNTER — Ambulatory Visit (INDEPENDENT_AMBULATORY_CARE_PROVIDER_SITE_OTHER): Payer: Medicare Other | Admitting: Cardiology

## 2017-11-27 VITALS — BP 120/82 | HR 117 | Ht 72.0 in | Wt 275.0 lb

## 2017-11-27 DIAGNOSIS — I2510 Atherosclerotic heart disease of native coronary artery without angina pectoris: Secondary | ICD-10-CM

## 2017-11-27 DIAGNOSIS — I480 Paroxysmal atrial fibrillation: Secondary | ICD-10-CM | POA: Diagnosis not present

## 2017-11-27 DIAGNOSIS — I471 Supraventricular tachycardia: Secondary | ICD-10-CM | POA: Diagnosis not present

## 2017-11-27 DIAGNOSIS — I10 Essential (primary) hypertension: Secondary | ICD-10-CM | POA: Diagnosis not present

## 2017-11-27 DIAGNOSIS — E785 Hyperlipidemia, unspecified: Secondary | ICD-10-CM

## 2017-11-27 DIAGNOSIS — I495 Sick sinus syndrome: Secondary | ICD-10-CM

## 2017-11-27 LAB — CUP PACEART INCLINIC DEVICE CHECK
Implantable Lead Location: 753860
Implantable Pulse Generator Implant Date: 20180830
MDC IDC LEAD IMPLANT DT: 20180830
MDC IDC LEAD IMPLANT DT: 20180830
MDC IDC LEAD LOCATION: 753859
MDC IDC PG SERIAL: 8939815
MDC IDC SESS DTM: 20181205132210

## 2017-11-27 MED ORDER — FUROSEMIDE 40 MG PO TABS: 80.0000 mg | ORAL_TABLET | Freq: Every day | ORAL | 0 refills | Status: DC

## 2017-11-27 NOTE — Patient Instructions (Addendum)
Medication Instructions:  Your physician has recommended you make the following change in your medication:  1) TAKE Lasix 80 mg daily for 4 days  * If you need a refill on your cardiac medications before your next appointment, please call your pharmacy. *  Labwork: Please have a BMET drawn at your primary doctor's office next week.  A handwritten prescription has been given to you for this.  Testing/Procedures: None ordered  Follow-Up: Remote monitoring is used to monitor your Pacemaker of ICD from home. This monitoring reduces the number of office visits required to check your device to one time per year. It allows [place] to keep an eye on the functioning of your device to ensure it is working properly. You are scheduled for a device check from home on 02/25/18. You may send your transmission at any time that day. If you have a wireless device, the transmission will be sent automatically. After your physician reviews your transmission, you will receive a postcard with your next transmission date.  Your physician wants you to follow-up in: 6 months with Dr. Curt Bears. You will receive a reminder letter in the mail two months in advance. If you don't receive a letter, please call our office to schedule the follow-up appointment.  Thank you for choosing [facility]!!   Trinidad Curet, RN [phone]  Any Other Special Instructions Will Be Listed Below (If Applicable).  Please call Dionne Milo, with [place], when you get home. Plug in your transmitter prior to calling her.   [phone]

## 2017-11-27 NOTE — Telephone Encounter (Signed)
New message    Pharmacy calling for clarification on furosemide (LASIX) 40 MG tablet dosage and frequency.  Pharmacy to clarify 4 days or 5 days. Please call [phone] ask for  [facility] or Otila Kluver.  Pt c/o medication issue:  1. Name of Medication: furosemide (LASIX) 40 MG tablet  2. How are you currently taking this medication (dosage and times per day)? Take 2 tablets (80 mg total) by mouth daily. For 4 days  3. Are you having a reaction (difficulty breathing--STAT)? NO  4. What is your medication issue? Pharmacy wants to confirm frequency

## 2017-11-27 NOTE — Telephone Encounter (Signed)
Informed pt is to take lasix 80 mg for 4 days.

## 2017-12-04 DIAGNOSIS — N183 Chronic kidney disease, stage 3 (moderate): Secondary | ICD-10-CM | POA: Diagnosis present

## 2017-12-04 DIAGNOSIS — Z794 Long term (current) use of insulin: Secondary | ICD-10-CM | POA: Diagnosis not present

## 2017-12-04 DIAGNOSIS — I5033 Acute on chronic diastolic (congestive) heart failure: Secondary | ICD-10-CM | POA: Diagnosis present

## 2017-12-04 DIAGNOSIS — E119 Type 2 diabetes mellitus without complications: Secondary | ICD-10-CM | POA: Diagnosis not present

## 2017-12-04 DIAGNOSIS — K922 Gastrointestinal hemorrhage, unspecified: Secondary | ICD-10-CM | POA: Diagnosis not present

## 2017-12-04 DIAGNOSIS — I4891 Unspecified atrial fibrillation: Secondary | ICD-10-CM | POA: Diagnosis not present

## 2017-12-04 DIAGNOSIS — Z6837 Body mass index (BMI) 37.0-37.9, adult: Secondary | ICD-10-CM | POA: Diagnosis not present

## 2017-12-04 DIAGNOSIS — I2510 Atherosclerotic heart disease of native coronary artery without angina pectoris: Secondary | ICD-10-CM | POA: Diagnosis not present

## 2017-12-04 DIAGNOSIS — I130 Hypertensive heart and chronic kidney disease with heart failure and stage 1 through stage 4 chronic kidney disease, or unspecified chronic kidney disease: Secondary | ICD-10-CM | POA: Diagnosis not present

## 2017-12-04 DIAGNOSIS — R0602 Shortness of breath: Secondary | ICD-10-CM | POA: Diagnosis not present

## 2017-12-04 DIAGNOSIS — N179 Acute kidney failure, unspecified: Secondary | ICD-10-CM | POA: Diagnosis not present

## 2017-12-04 DIAGNOSIS — G4733 Obstructive sleep apnea (adult) (pediatric): Secondary | ICD-10-CM | POA: Diagnosis not present

## 2017-12-04 DIAGNOSIS — I482 Chronic atrial fibrillation: Secondary | ICD-10-CM | POA: Diagnosis present

## 2017-12-04 DIAGNOSIS — Z87891 Personal history of nicotine dependence: Secondary | ICD-10-CM | POA: Diagnosis not present

## 2017-12-04 DIAGNOSIS — I495 Sick sinus syndrome: Secondary | ICD-10-CM | POA: Diagnosis not present

## 2017-12-04 DIAGNOSIS — I509 Heart failure, unspecified: Secondary | ICD-10-CM | POA: Diagnosis not present

## 2017-12-04 DIAGNOSIS — Z7901 Long term (current) use of anticoagulants: Secondary | ICD-10-CM | POA: Diagnosis not present

## 2017-12-04 DIAGNOSIS — I5031 Acute diastolic (congestive) heart failure: Secondary | ICD-10-CM | POA: Diagnosis not present

## 2017-12-04 DIAGNOSIS — N189 Chronic kidney disease, unspecified: Secondary | ICD-10-CM | POA: Diagnosis not present

## 2017-12-04 DIAGNOSIS — E1122 Type 2 diabetes mellitus with diabetic chronic kidney disease: Secondary | ICD-10-CM | POA: Diagnosis not present

## 2017-12-04 DIAGNOSIS — Z951 Presence of aortocoronary bypass graft: Secondary | ICD-10-CM | POA: Diagnosis not present

## 2017-12-04 DIAGNOSIS — Z950 Presence of cardiac pacemaker: Secondary | ICD-10-CM | POA: Diagnosis not present

## 2017-12-04 DIAGNOSIS — E785 Hyperlipidemia, unspecified: Secondary | ICD-10-CM | POA: Diagnosis not present

## 2017-12-04 DIAGNOSIS — D500 Iron deficiency anemia secondary to blood loss (chronic): Secondary | ICD-10-CM | POA: Diagnosis not present

## 2017-12-04 DIAGNOSIS — R0902 Hypoxemia: Secondary | ICD-10-CM | POA: Diagnosis not present

## 2017-12-04 DIAGNOSIS — I110 Hypertensive heart disease with heart failure: Secondary | ICD-10-CM | POA: Diagnosis not present

## 2017-12-10 ENCOUNTER — Encounter: Payer: Self-pay | Admitting: Cardiology

## 2017-12-10 ENCOUNTER — Ambulatory Visit (INDEPENDENT_AMBULATORY_CARE_PROVIDER_SITE_OTHER): Payer: Medicare Other | Admitting: Cardiology

## 2017-12-10 VITALS — BP 100/70 | HR 118 | Ht 72.0 in | Wt 276.0 lb

## 2017-12-10 DIAGNOSIS — I2510 Atherosclerotic heart disease of native coronary artery without angina pectoris: Secondary | ICD-10-CM

## 2017-12-10 DIAGNOSIS — I5032 Chronic diastolic (congestive) heart failure: Secondary | ICD-10-CM

## 2017-12-10 DIAGNOSIS — I10 Essential (primary) hypertension: Secondary | ICD-10-CM

## 2017-12-10 DIAGNOSIS — Z950 Presence of cardiac pacemaker: Secondary | ICD-10-CM | POA: Diagnosis not present

## 2017-12-10 DIAGNOSIS — I480 Paroxysmal atrial fibrillation: Secondary | ICD-10-CM | POA: Diagnosis not present

## 2017-12-10 NOTE — Progress Notes (Signed)
Cardiology Office Note:    Date:  12/10/2017   ID:  [name], DOB 02/22/1946, MRN [id number]  PCP:  [name], MD  Cardiologist:  [name], MD    Referring MD: [name], MD   Chief Complaint  Patient presents with  . Hospitalization Follow-up  I am still having shortness of breath  History of Present Illness:    Lawrence [name] is a 71 y.o. male with diastolic congestive heart failure.  Recently he ended up going to hospital because of shortness of breath and weight gain.  He was given diuretics and that he was discharged home still complaining of not feeling up to the part weak tired complaining of having shortness of breath.  Does have any chest pain tightness squeezing pressure burning chest no palpitations.  Does have proximal nocturnal dyspnea.  Cannot sleep at night.  Past Medical History:  Diagnosis Date  . Anemia   . Chronic airway obstruction, not elsewhere classified   . Chronic ischemic heart disease, unspecified   . Coronary artery disease   . Diabetes mellitus    type II, neuropathy,   . Diabetes mellitus without complication ([place])   . Essential hypertension, benign   . Hyperlipidemia   . Hyperlipidemia, mixed   . Hypertension   . Nodule of kidney    incidentally found on CT abdomen 11/2012.  Pending Urology evaluation.   . Nontoxic multinodular goiter   . Obesity   . Other testicular hypofunction   . PUD (peptic ulcer disease)   . Renal insufficiency, mild    hx of    Past Surgical History:  Procedure Laterality Date  . CHOLECYSTECTOMY    . CORONARY ARTERY BYPASS GRAFT  2007   x 4  . CORONARY ARTERY BYPASS GRAFT    . ESOPHAGOGASTRODUODENOSCOPY     10/06/2012:  hiatal hernia, moderate gastritis.  2012: Colonoscopy at [facility]:  one polyp.  Due next 2017  . EYE SURGERY    . KNEE DEBRIDEMENT    . PACEMAKER IMPLANT N/A 08/22/2017   Procedure: Pacemaker Implant;  Surgeon: Constance Haw, MD;  Location: [place] [facility];   Service: Cardiovascular;  Laterality: N/A;  . SHOULDER SURGERY     left shoulder  . SHOULDER SURGERY    . TONSILECTOMY, ADENOIDECTOMY, BILATERAL MYRINGOTOMY AND TUBES      Current Medications: Current Meds  Medication Sig  . amiodarone (PACERONE) 200 MG tablet Take 1 tablet (200 mg total) daily by mouth.  Marland Kitchen apixaban (ELIQUIS) 5 MG TABS tablet Take 5 mg by mouth 2 (two) times daily.  . furosemide (LASIX) 20 MG tablet Take 40 mg by mouth daily.  Marland Kitchen glimepiride (AMARYL) 4 MG tablet Take 4 mg by mouth 2 (two) times daily.   . insulin glargine (LANTUS) 100 UNIT/ML injection Inject 6 Units into the skin at bedtime.  . Ipratropium-Albuterol (COMBIVENT RESPIMAT) 20-100 MCG/ACT AERS respimat Inhale 1 puff into the lungs daily as needed for wheezing.  Marland Kitchen ipratropium-albuterol (DUONEB) 0.5-2.5 (3) MG/3ML SOLN Take 3 mLs by nebulization as needed.  . linagliptin (TRADJENTA) 5 MG TABS tablet Take 5 mg by mouth daily.  . nebivolol (BYSTOLIC) 5 MG tablet Take 5 mg by mouth daily.  . nitroGLYCERIN (NITROSTAT) 0.4 MG SL tablet Place 0.4 mg under the tongue.  . pantoprazole (PROTONIX) 40 MG tablet Take 40 mg by mouth daily.   . simvastatin (ZOCOR) 20 MG tablet Take 20 mg by mouth at bedtime.  Allergies:   Bactrim [sulfamethoxazole-trimethoprim]   Social History   Socioeconomic History  . Marital status: Married    Spouse name: None  . Number of children: 5  . Years of education: None  . Highest education level: None  Social Needs  . Financial resource strain: None  . Food insecurity - worry: None  . Food insecurity - inability: None  . Transportation needs - medical: None  . Transportation needs - non-medical: None  Occupational History  . Occupation: Retired    Comment: Medical illustrator; retired Chiropodist  Tobacco Use  . Smoking status: Former Smoker    Packs/day: 1.50    Years: 30.00    Pack years: 45.00    Types: Cigarettes  . Smokeless tobacco: Former Systems developer    Types: Chew   Substance and Sexual Activity  . Alcohol use: Yes  . Drug use: No  . Sexual activity: None  Other Topics Concern  . None  Social History Narrative   ** Merged History Encounter **       Lives with wife in a one story home.  Has 6 children.  Retired Nature conservation officer.  Education: college.     Family History: The patient's family history includes Asthma in his sister; Cancer in his father and mother; Colon cancer in his mother; Emphysema in his unknown relative; Esophageal cancer in his father; Hypertension in his mother; Kidney cancer in his mother; Stroke in his father. ROS:   Please see the history of present illness.    All 14 point review of systems negative except as described per history of present illness  EKGs/Labs/Other Studies Reviewed:      Recent Labs: 08/22/2017: TSH 0.585 09/13/2017: ALT 13; BNP 614.3; Hemoglobin 10.4; Platelets 221 11/04/2017: BUN 52; Creatinine, Ser 2.30; NT-Pro BNP 6,012; Potassium 4.5; Sodium 144  Recent Lipid Panel No results found for: CHOL, TRIG, HDL, CHOLHDL, VLDL, LDLCALC, LDLDIRECT  Physical Exam:    VS:  BP 100/70 (BP Location: Right Arm, Patient Position: Sitting, Cuff Size: Normal)   Pulse (!) 118   Ht 6' (1.829 m)   Wt 276 lb (125.2 kg)   SpO2 96%   BMI 37.43 kg/m     Wt Readings from Last 3 Encounters:  12/10/17 276 lb (125.2 kg)  11/27/17 275 lb (124.7 kg)  11/04/17 267 lb 12.8 oz (121.5 kg)     GEN:  Well nourished, well developed in no acute distress HEENT: Normal NECK: JVD elevated even if he is upright; No carotid bruits LYMPHATICS: No lymphadenopathy CARDIAC: RRR, no murmurs, no rubs, no gallops cardia RESPIRATORY:  Clear to auscultation without rales, wheezing or rhonchi  ABDOMEN: Soft, non-tender, non-distended MUSCULOSKELETAL:  No edema; No deformity  SKIN: Warm and dry LOWER EXTREMITIES: 1+ swelling NEUROLOGIC:  Alert and oriented x 3 PSYCHIATRIC:  Normal affect   ASSESSMENT:    1. CHF (congestive heart  failure), NYHA class II, chronic, diastolic ([place])   2. Coronary artery disease involving native coronary artery of native heart without angina pectoris   3. Essential hypertension, benign   4. Paroxysmal atrial fibrillation (HCC)   5. Pacemaker    PLAN:    In order of problems listed above:  1. Congestive heart failure: Decompensated.  I will ask him to double the dose of furosemide will do Chem-7 as well as proBNP today.  He is scheduled to see his primary care physician on Thursday and he will be reevaluated there.  To be very careful with diuresis because of  his kidney function which is deteriorating as well as blood pressure being low.  Therefore, frequent visits will be necessary.  I will see him back in my office in 1 week.  I interrogated his device today and the purpose of that interrogation was to see a check if he got in atrial fibrillation.  He seems to be in sinus tachycardia.  I check his pulse oximetry today his saturation was 93 on room air while he was sitting.  Again he will have frequent follow-up visits to make sure he stabilized. 2. Coronary artery disease: Stable denies having any symptoms. 3. Essential hypertension: Blood pressure appears to be low lower side. 4. Paroxysmal atrial fibrillation.  Appears to be in sinus rhythm today.  An EKG was done pacemaker was interrogated which confirm normal sinus rhythm with sinus tachycardia.  We will continue anticoagulation I will check his CBC today make sure we are not dealing with significant anemia. 5. Pacemaker present its St Jude device interrogated today functioning properly   Medication Adjustments/Labs and Tests Ordered: Current medicines are reviewed at length with the patient today.  Concerns regarding medicines are outlined above.  No orders of the defined types were placed in this encounter.  Medication changes: No orders of the defined types were placed in this encounter.   Signed, Park Liter, MD,  [facility] 12/10/2017 2:26 PM    [place]

## 2017-12-10 NOTE — Patient Instructions (Signed)
Medication Instructions:  Your physician has recommended you make the following change in your medication:  1) Double the dose of your Furosemide  Labwork: Your physician recommends that you have lab work today: BMP, BNP and CBC to check your kidney function, electrolytes, fluid built up on your heart/lungs and anemia  Testing/Procedures: EKG today  Follow-Up: Your physician recommends that you schedule a follow-up appointment in: 1 week with Dr. [name]   Any Other Special Instructions Will Be Listed Below (If Applicable).     If you need a refill on your cardiac medications before your next appointment, please call your pharmacy.

## 2017-12-11 DIAGNOSIS — Z0001 Encounter for general adult medical examination with abnormal findings: Secondary | ICD-10-CM | POA: Diagnosis not present

## 2017-12-11 DIAGNOSIS — D500 Iron deficiency anemia secondary to blood loss (chronic): Secondary | ICD-10-CM | POA: Diagnosis not present

## 2017-12-11 LAB — BASIC METABOLIC PANEL
BUN / CREAT RATIO: 24 (ref 10–24)
BUN: 60 mg/dL — ABNORMAL HIGH (ref 8–27)
CHLORIDE: 99 mmol/L (ref 96–106)
CO2: 25 mmol/L (ref 20–29)
Calcium: 8.9 mg/dL (ref 8.6–10.2)
Creatinine, Ser: 2.51 mg/dL — ABNORMAL HIGH (ref 0.76–1.27)
GFR calc Af Amer: 28 mL/min/{1.73_m2} — ABNORMAL LOW (ref >59)
GFR calc non Af Amer: 25 mL/min/{1.73_m2} — ABNORMAL LOW (ref >59)
GLUCOSE: 109 mg/dL — AB (ref 65–99)
POTASSIUM: 4.8 mmol/L (ref 3.5–5.2)
SODIUM: 140 mmol/L (ref 134–144)

## 2017-12-11 LAB — CBC
HEMOGLOBIN: 12.2 g/dL — AB (ref 13.0–17.7)
Hematocrit: 37.3 % — ABNORMAL LOW (ref 37.5–51.0)
MCH: 29.7 pg (ref 26.6–33.0)
MCHC: 32.7 g/dL (ref 31.5–35.7)
MCV: 91 fL (ref 79–97)
PLATELETS: 211 10*3/uL (ref 150–379)
RBC: 4.11 x10E6/uL — ABNORMAL LOW (ref 4.14–5.80)
RDW: 16 % — AB (ref 12.3–15.4)
WBC: 12.2 10*3/uL — ABNORMAL HIGH (ref 3.4–10.8)

## 2017-12-11 LAB — BRAIN NATRIURETIC PEPTIDE: BNP: 1148.5 pg/mL — AB (ref 0.0–100.0)

## 2017-12-12 DIAGNOSIS — E1121 Type 2 diabetes mellitus with diabetic nephropathy: Secondary | ICD-10-CM | POA: Diagnosis not present

## 2017-12-12 DIAGNOSIS — Z6841 Body Mass Index (BMI) 40.0 and over, adult: Secondary | ICD-10-CM | POA: Diagnosis not present

## 2017-12-12 DIAGNOSIS — I5030 Unspecified diastolic (congestive) heart failure: Secondary | ICD-10-CM | POA: Diagnosis not present

## 2017-12-12 DIAGNOSIS — N189 Chronic kidney disease, unspecified: Secondary | ICD-10-CM | POA: Diagnosis not present

## 2017-12-12 DIAGNOSIS — Z1339 Encounter for screening examination for other mental health and behavioral disorders: Secondary | ICD-10-CM | POA: Diagnosis not present

## 2017-12-12 DIAGNOSIS — Z1331 Encounter for screening for depression: Secondary | ICD-10-CM | POA: Diagnosis not present

## 2017-12-18 ENCOUNTER — Ambulatory Visit (INDEPENDENT_AMBULATORY_CARE_PROVIDER_SITE_OTHER): Payer: Medicare Other | Admitting: Cardiology

## 2017-12-18 ENCOUNTER — Encounter: Payer: Self-pay | Admitting: Cardiology

## 2017-12-18 VITALS — BP 126/60 | HR 74 | Ht 72.0 in | Wt 272.0 lb

## 2017-12-18 DIAGNOSIS — I480 Paroxysmal atrial fibrillation: Secondary | ICD-10-CM | POA: Diagnosis not present

## 2017-12-18 DIAGNOSIS — E782 Mixed hyperlipidemia: Secondary | ICD-10-CM | POA: Diagnosis not present

## 2017-12-18 DIAGNOSIS — I2510 Atherosclerotic heart disease of native coronary artery without angina pectoris: Secondary | ICD-10-CM

## 2017-12-18 DIAGNOSIS — Z950 Presence of cardiac pacemaker: Secondary | ICD-10-CM | POA: Diagnosis not present

## 2017-12-18 DIAGNOSIS — I5032 Chronic diastolic (congestive) heart failure: Secondary | ICD-10-CM | POA: Diagnosis not present

## 2017-12-18 NOTE — Patient Instructions (Signed)
Medication Instructions:  Your physician recommends that you continue on your current medications as directed. Please refer to the Current Medication list given to you today.  Labwork: Your physician recommends that you have the following labs drawn: CMP  Testing/Procedures: None  Follow-Up: Your physician recommends that you schedule a follow-up appointment in: 2 weeks  Any Other Special Instructions Will Be Listed Below (If Applicable).     If you need a refill on your cardiac medications before your next appointment, please call your pharmacy.   [place], RN, BSN

## 2017-12-18 NOTE — Progress Notes (Signed)
Cardiology Office Note:    Date:  12/18/2017   ID:  Lawrence [name], DOB Oct 04, 1946, MRN [id number]  PCP:  Lawrence Sheriff, MD  Cardiologist:  Lawrence Campus, MD    Referring MD: Lawrence Sheriff, MD   Chief Complaint  Patient presents with  . Follow-up  Feeling better  History of Present Illness:    Lawrence [name] is a 71 y.o. male with congestive heart failure which is decompensated as well as coronary artery disease.  We have been battling some decompensated congestive heart rate we did last few weeks but he is gradually getting better good diuresis.  He can finally sleep at night.  He is started even walking a little bit.  I will ask him to have complete metabolic panel change today and this is because he said he does not have appetite anytime he eats he feels very full.  To make sure he does not have any liver problem obviously his liver function test could be up because of the congestion but he is also taking amiodarone which can affect his liver function test.  Past Medical History:  Diagnosis Date  . Anemia   . Chronic airway obstruction, not elsewhere classified   . Chronic ischemic heart disease, unspecified   . Coronary artery disease   . Diabetes mellitus    type II, neuropathy,   . Diabetes mellitus without complication ([place])   . Essential hypertension, benign   . Hyperlipidemia   . Hyperlipidemia, mixed   . Hypertension   . Nodule of kidney    incidentally found on CT abdomen 11/2012.  Pending Urology evaluation.   . Nontoxic multinodular goiter   . Obesity   . Other testicular hypofunction   . PUD (peptic ulcer disease)   . Renal insufficiency, mild    hx of    Past Surgical History:  Procedure Laterality Date  . CHOLECYSTECTOMY    . CORONARY ARTERY BYPASS GRAFT  2007   x 4  . CORONARY ARTERY BYPASS GRAFT    . ESOPHAGOGASTRODUODENOSCOPY     10/06/2012:  hiatal hernia, moderate gastritis.  2012: Colonoscopy at [facility]:  one polyp.  Due next 2017  .  EYE SURGERY    . KNEE DEBRIDEMENT    . PACEMAKER IMPLANT N/A 08/22/2017   Procedure: Pacemaker Implant;  Surgeon: Lawrence Haw, MD;  Location: [place] [facility];  Service: Cardiovascular;  Laterality: N/A;  . SHOULDER SURGERY     left shoulder  . SHOULDER SURGERY    . TONSILECTOMY, ADENOIDECTOMY, BILATERAL MYRINGOTOMY AND TUBES      Current Medications: Current Meds  Medication Sig  . amiodarone (PACERONE) 200 MG tablet Take 1 tablet (200 mg total) daily by mouth.  Marland Kitchen apixaban (ELIQUIS) 5 MG TABS tablet Take 5 mg by mouth 2 (two) times daily.  . furosemide (LASIX) 20 MG tablet Take 40 mg by mouth daily.  Marland Kitchen glimepiride (AMARYL) 4 MG tablet Take 2 mg by mouth 2 (two) times daily.   . insulin glargine (LANTUS) 100 UNIT/ML injection Inject 6 Units into the skin at bedtime.  . Ipratropium-Albuterol (COMBIVENT RESPIMAT) 20-100 MCG/ACT AERS respimat Inhale 1 puff into the lungs daily as needed for wheezing.  Marland Kitchen ipratropium-albuterol (DUONEB) 0.5-2.5 (3) MG/3ML SOLN Take 3 mLs by nebulization as needed.  . linagliptin (TRADJENTA) 5 MG TABS tablet Take 5 mg by mouth daily.  . nebivolol (BYSTOLIC) 5 MG tablet Take 5 mg by mouth daily.  . nitroGLYCERIN (NITROSTAT) 0.4  MG SL tablet Place 0.4 mg under the tongue.  . pantoprazole (PROTONIX) 40 MG tablet Take 40 mg by mouth daily.   . simvastatin (ZOCOR) 20 MG tablet Take 20 mg by mouth at bedtime.      Allergies:   Bactrim [sulfamethoxazole-trimethoprim]   Social History   Socioeconomic History  . Marital status: Married    Spouse name: None  . Number of children: 5  . Years of education: None  . Highest education level: None  Social Needs  . Financial resource strain: None  . Food insecurity - worry: None  . Food insecurity - inability: None  . Transportation needs - medical: None  . Transportation needs - non-medical: None  Occupational History  . Occupation: Retired    Comment: Medical illustrator; retired Chiropodist    Tobacco Use  . Smoking status: Former Smoker    Packs/day: 1.50    Years: 30.00    Pack years: 45.00    Types: Cigarettes  . Smokeless tobacco: Former Systems developer    Types: Chew  Substance and Sexual Activity  . Alcohol use: Yes  . Drug use: No  . Sexual activity: None  Other Topics Concern  . None  Social History Narrative   ** Merged History Encounter **       Lives with wife in a one story home.  Has 6 children.  Retired Nature conservation officer.  Education: college.     Family History: The patient's family history includes Asthma in his sister; Cancer in his father and mother; Colon cancer in his mother; Emphysema in his unknown relative; Esophageal cancer in his father; Hypertension in his mother; Kidney cancer in his mother; Stroke in his father. ROS:   Please see the history of present illness.    All 14 point review of systems negative except as described per history of present illness  EKGs/Labs/Other Studies Reviewed:      Recent Labs: 08/22/2017: TSH 0.585 09/13/2017: ALT 13 11/04/2017: NT-Pro BNP 6,012 12/10/2017: BNP 1,148.5; BUN 60; Creatinine, Ser 2.51; Hemoglobin 12.2; Platelets 211; Potassium 4.8; Sodium 140  Recent Lipid Panel No results found for: CHOL, TRIG, HDL, CHOLHDL, VLDL, LDLCALC, LDLDIRECT  Physical Exam:    VS:  BP 126/60   Pulse 74   Ht 6' (1.829 m)   Wt 272 lb (123.4 kg)   SpO2 94%   BMI 36.89 kg/m     Wt Readings from Last 3 Encounters:  12/18/17 272 lb (123.4 kg)  12/10/17 276 lb (125.2 kg)  11/27/17 275 lb (124.7 kg)     GEN:  Well nourished, well developed in no acute distress HEENT: Normal NECK: No JVD; No carotid bruits LYMPHATICS: No lymphadenopathy CARDIAC: RRR, no murmurs, no rubs, no gallops RESPIRATORY:  Clear to auscultation without rales, wheezing or rhonchi  ABDOMEN: Soft, non-tender, non-distended MUSCULOSKELETAL:  No edema; No deformity  SKIN: Warm and dry LOWER EXTREMITIES: no swelling NEUROLOGIC:  Alert and oriented x  3 PSYCHIATRIC:  Normal affect   ASSESSMENT:    1. CHF (congestive heart failure), NYHA class II, chronic, diastolic ([place])   2. Coronary artery disease involving native coronary artery of native heart without angina pectoris   3. Paroxysmal atrial fibrillation (HCC)   4. Hyperlipidemia, mixed   5. Pacemaker    PLAN:    In order of problems listed above:  1. Congestive heart failure much improved he is doing better we will continue present management complete metabolic panel will be checked. 2. Coronary artery disease: Appears to  be asymptomatic continue present management 3. Paroxysmal atrial fibrillation: Appears to be in sinus rhythm.  We continue anticoagulation 4. Pacemaker: Continue present management   Medication Adjustments/Labs and Tests Ordered: Current medicines are reviewed at length with the patient today.  Concerns regarding medicines are outlined above.  No orders of the defined types were placed in this encounter.  Medication changes: No orders of the defined types were placed in this encounter.   Signed, Park Liter, MD, [facility] 12/18/2017 2:34 PM    [place]

## 2017-12-19 LAB — COMPREHENSIVE METABOLIC PANEL
A/G RATIO: 1.6 (ref 1.2–2.2)
ALT: 15 IU/L (ref 0–44)
AST: 28 IU/L (ref 0–40)
Albumin: 4.1 g/dL (ref 3.5–4.8)
Alkaline Phosphatase: 148 IU/L — ABNORMAL HIGH (ref 39–117)
BILIRUBIN TOTAL: 0.8 mg/dL (ref 0.0–1.2)
BUN/Creatinine Ratio: 19 (ref 10–24)
BUN: 43 mg/dL — ABNORMAL HIGH (ref 8–27)
CALCIUM: 9.3 mg/dL (ref 8.6–10.2)
CHLORIDE: 102 mmol/L (ref 96–106)
CO2: 26 mmol/L (ref 20–29)
Creatinine, Ser: 2.3 mg/dL — ABNORMAL HIGH (ref 0.76–1.27)
GFR, EST AFRICAN AMERICAN: 32 mL/min/{1.73_m2} — AB (ref >59)
GFR, EST NON AFRICAN AMERICAN: 27 mL/min/{1.73_m2} — AB (ref >59)
GLOBULIN, TOTAL: 2.5 g/dL (ref 1.5–4.5)
Glucose: 145 mg/dL — ABNORMAL HIGH (ref 65–99)
POTASSIUM: 4.7 mmol/L (ref 3.5–5.2)
SODIUM: 147 mmol/L — AB (ref 134–144)
TOTAL PROTEIN: 6.6 g/dL (ref 6.0–8.5)

## 2017-12-30 ENCOUNTER — Encounter (INDEPENDENT_AMBULATORY_CARE_PROVIDER_SITE_OTHER): Payer: Self-pay

## 2018-01-01 DIAGNOSIS — Z6841 Body Mass Index (BMI) 40.0 and over, adult: Secondary | ICD-10-CM | POA: Diagnosis not present

## 2018-01-01 DIAGNOSIS — E1140 Type 2 diabetes mellitus with diabetic neuropathy, unspecified: Secondary | ICD-10-CM | POA: Diagnosis not present

## 2018-01-01 DIAGNOSIS — J189 Pneumonia, unspecified organism: Secondary | ICD-10-CM | POA: Diagnosis not present

## 2018-01-01 DIAGNOSIS — I509 Heart failure, unspecified: Secondary | ICD-10-CM | POA: Diagnosis not present

## 2018-01-01 DIAGNOSIS — N189 Chronic kidney disease, unspecified: Secondary | ICD-10-CM | POA: Diagnosis not present

## 2018-01-02 ENCOUNTER — Inpatient Hospital Stay (HOSPITAL_BASED_OUTPATIENT_CLINIC_OR_DEPARTMENT_OTHER)
Admission: EM | Admit: 2018-01-02 | Discharge: 2018-01-13 | DRG: 871 | Disposition: A | Discharge status: Home Health | Payer: Medicare Other | Attending: Internal Medicine | Admitting: Internal Medicine

## 2018-01-02 ENCOUNTER — Emergency Department (HOSPITAL_BASED_OUTPATIENT_CLINIC_OR_DEPARTMENT_OTHER): Payer: Medicare Other

## 2018-01-02 ENCOUNTER — Encounter (HOSPITAL_BASED_OUTPATIENT_CLINIC_OR_DEPARTMENT_OTHER): Payer: Self-pay | Admitting: *Deleted

## 2018-01-02 ENCOUNTER — Other Ambulatory Visit: Payer: Self-pay

## 2018-01-02 ENCOUNTER — Ambulatory Visit (INDEPENDENT_AMBULATORY_CARE_PROVIDER_SITE_OTHER): Payer: Medicare Other | Admitting: Cardiology

## 2018-01-02 VITALS — BP 110/80 | HR 103 | Ht 72.0 in | Wt 267.0 lb

## 2018-01-02 DIAGNOSIS — I130 Hypertensive heart and chronic kidney disease with heart failure and stage 1 through stage 4 chronic kidney disease, or unspecified chronic kidney disease: Secondary | ICD-10-CM | POA: Diagnosis present

## 2018-01-02 DIAGNOSIS — E782 Mixed hyperlipidemia: Secondary | ICD-10-CM | POA: Diagnosis present

## 2018-01-02 DIAGNOSIS — R680 Hypothermia, not associated with low environmental temperature: Secondary | ICD-10-CM | POA: Diagnosis present

## 2018-01-02 DIAGNOSIS — J181 Lobar pneumonia, unspecified organism: Secondary | ICD-10-CM | POA: Diagnosis not present

## 2018-01-02 DIAGNOSIS — E042 Nontoxic multinodular goiter: Secondary | ICD-10-CM | POA: Diagnosis present

## 2018-01-02 DIAGNOSIS — E1142 Type 2 diabetes mellitus with diabetic polyneuropathy: Secondary | ICD-10-CM | POA: Diagnosis present

## 2018-01-02 DIAGNOSIS — Z79899 Other long term (current) drug therapy: Secondary | ICD-10-CM

## 2018-01-02 DIAGNOSIS — J1289 Other viral pneumonia: Secondary | ICD-10-CM | POA: Diagnosis present

## 2018-01-02 DIAGNOSIS — Z825 Family history of asthma and other chronic lower respiratory diseases: Secondary | ICD-10-CM

## 2018-01-02 DIAGNOSIS — T68XXXA Hypothermia, initial encounter: Secondary | ICD-10-CM | POA: Diagnosis not present

## 2018-01-02 DIAGNOSIS — I361 Nonrheumatic tricuspid (valve) insufficiency: Secondary | ICD-10-CM | POA: Diagnosis not present

## 2018-01-02 DIAGNOSIS — B9729 Other coronavirus as the cause of diseases classified elsewhere: Secondary | ICD-10-CM | POA: Diagnosis present

## 2018-01-02 DIAGNOSIS — R0602 Shortness of breath: Secondary | ICD-10-CM

## 2018-01-02 DIAGNOSIS — A4189 Other specified sepsis: Principal | ICD-10-CM | POA: Diagnosis present

## 2018-01-02 DIAGNOSIS — J410 Simple chronic bronchitis: Secondary | ICD-10-CM

## 2018-01-02 DIAGNOSIS — Z794 Long term (current) use of insulin: Secondary | ICD-10-CM

## 2018-01-02 DIAGNOSIS — I5033 Acute on chronic diastolic (congestive) heart failure: Secondary | ICD-10-CM | POA: Diagnosis present

## 2018-01-02 DIAGNOSIS — I2510 Atherosclerotic heart disease of native coronary artery without angina pectoris: Secondary | ICD-10-CM | POA: Diagnosis not present

## 2018-01-02 DIAGNOSIS — Z950 Presence of cardiac pacemaker: Secondary | ICD-10-CM

## 2018-01-02 DIAGNOSIS — Z6834 Body mass index (BMI) 34.0-34.9, adult: Secondary | ICD-10-CM | POA: Diagnosis not present

## 2018-01-02 DIAGNOSIS — J129 Viral pneumonia, unspecified: Secondary | ICD-10-CM | POA: Diagnosis not present

## 2018-01-02 DIAGNOSIS — E162 Hypoglycemia, unspecified: Secondary | ICD-10-CM | POA: Diagnosis not present

## 2018-01-02 DIAGNOSIS — T380X5A Adverse effect of glucocorticoids and synthetic analogues, initial encounter: Secondary | ICD-10-CM | POA: Diagnosis present

## 2018-01-02 DIAGNOSIS — I5030 Unspecified diastolic (congestive) heart failure: Secondary | ICD-10-CM | POA: Diagnosis not present

## 2018-01-02 DIAGNOSIS — E1122 Type 2 diabetes mellitus with diabetic chronic kidney disease: Secondary | ICD-10-CM | POA: Diagnosis present

## 2018-01-02 DIAGNOSIS — I5032 Chronic diastolic (congestive) heart failure: Secondary | ICD-10-CM | POA: Diagnosis not present

## 2018-01-02 DIAGNOSIS — A419 Sepsis, unspecified organism: Secondary | ICD-10-CM | POA: Diagnosis not present

## 2018-01-02 DIAGNOSIS — I10 Essential (primary) hypertension: Secondary | ICD-10-CM

## 2018-01-02 DIAGNOSIS — I509 Heart failure, unspecified: Secondary | ICD-10-CM | POA: Diagnosis not present

## 2018-01-02 DIAGNOSIS — Z883 Allergy status to other anti-infective agents status: Secondary | ICD-10-CM

## 2018-01-02 DIAGNOSIS — Z9049 Acquired absence of other specified parts of digestive tract: Secondary | ICD-10-CM | POA: Diagnosis not present

## 2018-01-02 DIAGNOSIS — J440 Chronic obstructive pulmonary disease with acute lower respiratory infection: Secondary | ICD-10-CM | POA: Diagnosis present

## 2018-01-02 DIAGNOSIS — Z7901 Long term (current) use of anticoagulants: Secondary | ICD-10-CM

## 2018-01-02 DIAGNOSIS — N179 Acute kidney failure, unspecified: Secondary | ICD-10-CM | POA: Diagnosis present

## 2018-01-02 DIAGNOSIS — R001 Bradycardia, unspecified: Secondary | ICD-10-CM | POA: Diagnosis not present

## 2018-01-02 DIAGNOSIS — I110 Hypertensive heart disease with heart failure: Secondary | ICD-10-CM | POA: Diagnosis not present

## 2018-01-02 DIAGNOSIS — Z951 Presence of aortocoronary bypass graft: Secondary | ICD-10-CM

## 2018-01-02 DIAGNOSIS — E11649 Type 2 diabetes mellitus with hypoglycemia without coma: Secondary | ICD-10-CM | POA: Diagnosis not present

## 2018-01-02 DIAGNOSIS — D509 Iron deficiency anemia, unspecified: Secondary | ICD-10-CM | POA: Diagnosis present

## 2018-01-02 DIAGNOSIS — R6510 Systemic inflammatory response syndrome (SIRS) of non-infectious origin without acute organ dysfunction: Secondary | ICD-10-CM | POA: Diagnosis not present

## 2018-01-02 DIAGNOSIS — N183 Chronic kidney disease, stage 3 (moderate): Secondary | ICD-10-CM | POA: Diagnosis not present

## 2018-01-02 DIAGNOSIS — E118 Type 2 diabetes mellitus with unspecified complications: Secondary | ICD-10-CM | POA: Diagnosis not present

## 2018-01-02 DIAGNOSIS — J9601 Acute respiratory failure with hypoxia: Secondary | ICD-10-CM

## 2018-01-02 DIAGNOSIS — I480 Paroxysmal atrial fibrillation: Secondary | ICD-10-CM | POA: Diagnosis not present

## 2018-01-02 DIAGNOSIS — B342 Coronavirus infection, unspecified: Secondary | ICD-10-CM | POA: Diagnosis not present

## 2018-01-02 DIAGNOSIS — E1165 Type 2 diabetes mellitus with hyperglycemia: Secondary | ICD-10-CM | POA: Diagnosis not present

## 2018-01-02 DIAGNOSIS — Z8249 Family history of ischemic heart disease and other diseases of the circulatory system: Secondary | ICD-10-CM

## 2018-01-02 DIAGNOSIS — J189 Pneumonia, unspecified organism: Secondary | ICD-10-CM

## 2018-01-02 DIAGNOSIS — R6520 Severe sepsis without septic shock: Secondary | ICD-10-CM | POA: Diagnosis not present

## 2018-01-02 DIAGNOSIS — Z87891 Personal history of nicotine dependence: Secondary | ICD-10-CM

## 2018-01-02 HISTORY — DX: Pneumonia, unspecified organism: J18.9

## 2018-01-02 LAB — CBG MONITORING, ED
GLUCOSE-CAPILLARY: 69 mg/dL (ref 65–99)
GLUCOSE-CAPILLARY: 120 mg/dL — AB (ref 65–99)
GLUCOSE-CAPILLARY: 85 mg/dL (ref 65–99)
GLUCOSE-CAPILLARY: 95 mg/dL (ref 65–99)
GLUCOSE-CAPILLARY: 66 mg/dL (ref 65–99)
Glucose-Capillary: 128 mg/dL — ABNORMAL HIGH (ref 65–99)
Glucose-Capillary: 157 mg/dL — ABNORMAL HIGH (ref 65–99)
Glucose-Capillary: 67 mg/dL (ref 65–99)

## 2018-01-02 LAB — I-STAT ARTERIAL BLOOD GAS, ED
ACID-BASE EXCESS: 4 mmol/L — AB (ref 0.0–2.0)
BICARBONATE: 27.7 mmol/L (ref 20.0–28.0)
O2 SAT: 100 %
PH ART: 7.496 — AB (ref 7.350–7.450)
TCO2: 29 mmol/L (ref 22–32)
pCO2 arterial: 35.6 mmHg (ref 32.0–48.0)
pO2, Arterial: 447 mmHg — ABNORMAL HIGH (ref 83.0–108.0)

## 2018-01-02 LAB — HEPATIC FUNCTION PANEL
ALT: 29 U/L (ref 17–63)
AST: 51 U/L — AB (ref 15–41)
Albumin: 3.8 g/dL (ref 3.5–5.0)
Alkaline Phosphatase: 170 U/L — ABNORMAL HIGH (ref 38–126)
BILIRUBIN INDIRECT: 0.8 mg/dL (ref 0.3–0.9)
Bilirubin, Direct: 0.3 mg/dL (ref 0.1–0.5)
Total Bilirubin: 1.1 mg/dL (ref 0.3–1.2)
Total Protein: 7.9 g/dL (ref 6.5–8.1)

## 2018-01-02 LAB — BRAIN NATRIURETIC PEPTIDE: B NATRIURETIC PEPTIDE 5: 1559.8 pg/mL — AB (ref 0.0–100.0)

## 2018-01-02 LAB — BASIC METABOLIC PANEL
ANION GAP: 12 (ref 5–15)
BUN: 35 mg/dL — ABNORMAL HIGH (ref 6–20)
CALCIUM: 8.8 mg/dL — AB (ref 8.9–10.3)
CO2: 28 mmol/L (ref 22–32)
Chloride: 95 mmol/L — ABNORMAL LOW (ref 101–111)
Creatinine, Ser: 2.16 mg/dL — ABNORMAL HIGH (ref 0.61–1.24)
GFR calc Af Amer: 33 mL/min — ABNORMAL LOW (ref >60)
GFR, EST NON AFRICAN AMERICAN: 29 mL/min — AB (ref >60)
Glucose, Bld: 51 mg/dL — ABNORMAL LOW (ref 65–99)
Potassium: 4.2 mmol/L (ref 3.5–5.1)
SODIUM: 135 mmol/L (ref 135–145)

## 2018-01-02 LAB — URINALYSIS, ROUTINE W REFLEX MICROSCOPIC
BILIRUBIN URINE: NEGATIVE
Glucose, UA: NEGATIVE mg/dL
HGB URINE DIPSTICK: NEGATIVE
KETONES UR: NEGATIVE mg/dL
Leukocytes, UA: NEGATIVE
NITRITE: NEGATIVE
Protein, ur: NEGATIVE mg/dL
Specific Gravity, Urine: 1.025 (ref 1.005–1.030)
pH: 5.5 (ref 5.0–8.0)

## 2018-01-02 LAB — CBC
HCT: 40.3 % (ref 39.0–52.0)
HEMOGLOBIN: 13.2 g/dL (ref 13.0–17.0)
MCH: 30.6 pg (ref 26.0–34.0)
MCHC: 32.8 g/dL (ref 30.0–36.0)
MCV: 93.5 fL (ref 78.0–100.0)
PLATELETS: 194 10*3/uL (ref 150–400)
RBC: 4.31 MIL/uL (ref 4.22–5.81)
RDW: 17 % — ABNORMAL HIGH (ref 11.5–15.5)
WBC: 14.2 10*3/uL — AB (ref 4.0–10.5)

## 2018-01-02 LAB — PROTIME-INR
INR: 2.11
PROTHROMBIN TIME: 23.5 s — AB (ref 11.4–15.2)

## 2018-01-02 LAB — I-STAT CG4 LACTIC ACID, ED: LACTIC ACID, VENOUS: 2.19 mmol/L — AB (ref 0.5–1.9)

## 2018-01-02 LAB — TROPONIN I: TROPONIN I: 0.03 ng/mL — AB (ref <0.03)

## 2018-01-02 LAB — GLUCOSE, CAPILLARY: Glucose-Capillary: 44 mg/dL — CL (ref 65–99)

## 2018-01-02 LAB — LIPASE, BLOOD: Lipase: 45 U/L (ref 11–51)

## 2018-01-02 MED ORDER — DEXTROSE 50 % IV SOLN
25.0000 g | Freq: Once | INTRAVENOUS | Status: AC
  Administered 2018-01-02: 25 g via INTRAVENOUS
  Filled 2018-01-02: qty 50

## 2018-01-02 MED ORDER — PIPERACILLIN-TAZOBACTAM 3.375 G IVPB
3.3750 g | Freq: Three times a day (TID) | INTRAVENOUS | Status: DC
  Administered 2018-01-03: 3.375 g via INTRAVENOUS
  Filled 2018-01-02 (×3): qty 50

## 2018-01-02 MED ORDER — ORAL CARE MOUTH RINSE
15.0000 mL | Freq: Two times a day (BID) | OROMUCOSAL | Status: DC
  Administered 2018-01-04 – 2018-01-13 (×14): 15 mL via OROMUCOSAL

## 2018-01-02 MED ORDER — VANCOMYCIN HCL IN DEXTROSE 1-5 GM/200ML-% IV SOLN
1000.0000 mg | Freq: Once | INTRAVENOUS | Status: DC
  Filled 2018-01-02: qty 200

## 2018-01-02 MED ORDER — VANCOMYCIN HCL IN DEXTROSE 1-5 GM/200ML-% IV SOLN
1000.0000 mg | Freq: Once | INTRAVENOUS | Status: AC
  Administered 2018-01-02: 1000 mg via INTRAVENOUS

## 2018-01-02 MED ORDER — DEXTROSE 10 % IV SOLN
INTRAVENOUS | Status: DC
  Administered 2018-01-02 – 2018-01-03 (×4): via INTRAVENOUS

## 2018-01-02 MED ORDER — VANCOMYCIN HCL IN DEXTROSE 1-5 GM/200ML-% IV SOLN: 1000.0000 mg | Freq: Once | INTRAVENOUS | Status: DC

## 2018-01-02 MED ORDER — IPRATROPIUM-ALBUTEROL 0.5-2.5 (3) MG/3ML IN SOLN
3.0000 mL | Freq: Four times a day (QID) | RESPIRATORY_TRACT | Status: DC
  Administered 2018-01-02 – 2018-01-04 (×10): 3 mL via RESPIRATORY_TRACT
  Filled 2018-01-02 (×11): qty 3

## 2018-01-02 MED ORDER — DEXTROSE 50 % IV SOLN
INTRAVENOUS | Status: AC
  Administered 2018-01-02: 50 mL via INTRAVENOUS
  Filled 2018-01-02: qty 50

## 2018-01-02 MED ORDER — SODIUM CHLORIDE 0.9 % IV SOLN
1500.0000 mg | INTRAVENOUS | Status: DC
  Filled 2018-01-02: qty 1500

## 2018-01-02 MED ORDER — PIPERACILLIN-TAZOBACTAM 3.375 G IVPB 30 MIN
3.3750 g | Freq: Once | INTRAVENOUS | Status: AC
  Administered 2018-01-02: 3.375 g via INTRAVENOUS
  Filled 2018-01-02 (×2): qty 50

## 2018-01-02 MED ORDER — DEXTROSE 50 % IV SOLN
1.0000 | Freq: Once | INTRAVENOUS | Status: AC
  Administered 2018-01-02: 50 mL via INTRAVENOUS

## 2018-01-02 MED ORDER — CHLORHEXIDINE GLUCONATE 0.12 % MT SOLN
15.0000 mL | Freq: Two times a day (BID) | OROMUCOSAL | Status: DC
  Administered 2018-01-03 – 2018-01-12 (×17): 15 mL via OROMUCOSAL
  Filled 2018-01-02 (×18): qty 15

## 2018-01-02 MED ORDER — FUROSEMIDE 10 MG/ML IJ SOLN
80.0000 mg | Freq: Once | INTRAMUSCULAR | Status: AC
  Administered 2018-01-02: 80 mg via INTRAVENOUS
  Filled 2018-01-02: qty 8

## 2018-01-02 NOTE — ED Notes (Signed)
Right Raidal Arterial x 1 for ABG. No complication noted.

## 2018-01-02 NOTE — ED Notes (Signed)
Pt requested ice chips

## 2018-01-02 NOTE — Progress Notes (Signed)
Cardiology Office Note:    Date:  01/02/2018   ID:  Lawrence [name], DOB October 17, 1946, MRN [id number]  PCP:  [name], MD  Cardiologist:  [name], MD    Referring MD: [name], MD   Chief Complaint  Patient presents with  . 2 Week Follow-up  Not feeling well  History of Present Illness:    Lawrence [name] is a 72 y.o. male with history of diastolic congestive heart failure.  He required frequent recent visits trying to diurese him and compensate his congestive heart failure.  I seen him last time about 2 weeks ago he started getting better he was able to lay down and sleep at night for the first time within last few months.  He visited his primary care physician yesterday and he was noted to be hypoglycemic all diabetic medication has been withdrawn.  He did not take any of his medication for diabetes with him.  Of last 24 hours.  According to his wife he woke up in the middle of the night and started saying something to his wife she could not understand.  She checked his sugar and his sugar was 28.  She gave him something sweet to eat and after that in the morning his sugar was 98.  He came today to my office and from the very beginning we knew that something was not right.  He sugar was checked and his sugar was 31.  We gave him ice tea and some glucose tablets.  He was still shaking he was able to recognize me that he did not offer any complaint denies having any pain he was coughing.  Surgeon was made to bring him to the emergency room to stabilize him and establish diagnosis.  Communicated all this information to ER staff.  Past Medical History:  Diagnosis Date  . Anemia   . Chronic airway obstruction, not elsewhere classified   . Chronic ischemic heart disease, unspecified   . Coronary artery disease   . Diabetes mellitus    type II, neuropathy,   . Diabetes mellitus without complication ([place])   . Essential hypertension, benign   . Hyperlipidemia   .  Hyperlipidemia, mixed   . Hypertension   . Nodule of kidney    incidentally found on CT abdomen 11/2012.  Pending Urology evaluation.   . Nontoxic multinodular goiter   . Obesity   . Other testicular hypofunction   . PUD (peptic ulcer disease)   . Renal insufficiency, mild    hx of    Past Surgical History:  Procedure Laterality Date  . CHOLECYSTECTOMY    . CORONARY ARTERY BYPASS GRAFT  2007   x 4  . CORONARY ARTERY BYPASS GRAFT    . ESOPHAGOGASTRODUODENOSCOPY     10/06/2012:  hiatal hernia, moderate gastritis.  2012: Colonoscopy at [facility]:  one polyp.  Due next 2017  . EYE SURGERY    . KNEE DEBRIDEMENT    . PACEMAKER IMPLANT N/A 08/22/2017   Procedure: Pacemaker Implant;  Surgeon: Constance Haw, MD;  Location: [facility];  Service: Cardiovascular;  Laterality: N/A;  . SHOULDER SURGERY     left shoulder  . SHOULDER SURGERY    . TONSILECTOMY, ADENOIDECTOMY, BILATERAL MYRINGOTOMY AND TUBES      Current Medications: No outpatient medications have been marked as taking for the 01/02/18 encounter (Office Visit) with Park Liter, MD.     Allergies:   Bactrim [sulfamethoxazole-trimethoprim]  Social History   Socioeconomic History  . Marital status: Married    Spouse name: Not on file  . Number of children: 5  . Years of education: Not on file  . Highest education level: Not on file  Social Needs  . Financial resource strain: Not on file  . Food insecurity - worry: Not on file  . Food insecurity - inability: Not on file  . Transportation needs - medical: Not on file  . Transportation needs - non-medical: Not on file  Occupational History  . Occupation: Retired    Comment: Medical illustrator; retired Chiropodist  Tobacco Use  . Smoking status: Former Smoker    Packs/day: 1.50    Years: 30.00    Pack years: 45.00    Types: Cigarettes  . Smokeless tobacco: Former Systems developer    Types: Chew  Substance and Sexual Activity  . Alcohol use: Yes  . Drug  use: No  . Sexual activity: Not on file  Other Topics Concern  . Not on file  Social History Narrative   ** Merged History Encounter **       Lives with wife in a one story home.  Has 6 children.  Retired Nature conservation officer.  Education: college.     Family History: The patient's family history includes Asthma in his sister; Cancer in his father and mother; Colon cancer in his mother; Emphysema in his unknown relative; Esophageal cancer in his father; Hypertension in his mother; Kidney cancer in his mother; Stroke in his father. ROS:   Please see the history of present illness.    All 14 point review of systems negative except as described per history of present illness  EKGs/Labs/Other Studies Reviewed:      Recent Labs: 08/22/2017: TSH 0.585 11/04/2017: NT-Pro BNP 6,012 12/10/2017: BNP 1,148.5; Hemoglobin 12.2; Platelets 211 12/18/2017: ALT 15; BUN 43; Creatinine, Ser 2.30; Potassium 4.7; Sodium 147  Recent Lipid Panel No results found for: CHOL, TRIG, HDL, CHOLHDL, VLDL, LDLCALC, LDLDIRECT  Physical Exam:    VS:  BP 110/80   Pulse (!) 103   Ht 6' (1.829 m)   Wt 267 lb (121.1 kg) Comment: reported  SpO2 97%   BMI 36.21 kg/m     Wt Readings from Last 3 Encounters:  01/02/18 267 lb (121.1 kg)  12/18/17 272 lb (123.4 kg)  12/10/17 276 lb (125.2 kg)     GEN:  Well nourished, well developed in no acute distress HEENT: Normal NECK: No JVD; No carotid bruits LYMPHATICS: No lymphadenopathy CARDIAC: RRR, no murmurs, no rubs, no gallops RESPIRATORY:  Clear to auscultation without rales, wheezing or rhonchi  ABDOMEN: Soft, non-tender, non-distended MUSCULOSKELETAL:  No edema; No deformity  SKIN: Warm and dry LOWER EXTREMITIES: no swelling NEUROLOGIC:  Alert and oriented x 3 PSYCHIATRIC:  Normal affect   ASSESSMENT:    1. CHF (congestive heart failure), NYHA class II, chronic, diastolic ([place])   2. Coronary artery disease involving native coronary artery of native heart without  angina pectoris   3. Essential hypertension, benign   4. Paroxysmal atrial fibrillation (HCC)   5. Simple chronic bronchitis ([place])   6. Pacemaker   7. Bradycardia with 31-40 beats per minute   8. Hypoglycemia    PLAN:    In order of problems listed above:  1. Congestive heart failure: Brief physical exam performed showed no gross decompensation but his lungs got some rhonchi's. 2. Coronary artery disease: Denies having any recent chest pain. 3. Essential hypertension blood pressure appears  to be controlled today. 4. Paroxysmal atrial fibrillation lately stable continue present management 5. Pacemaker recent interrogation December showed normal function 6. Hypoglycemia: Will be managed in the emergency room   Medication Adjustments/Labs and Tests Ordered: Current medicines are reviewed at length with the patient today.  Concerns regarding medicines are outlined above.  No orders of the defined types were placed in this encounter.  Medication changes: No orders of the defined types were placed in this encounter.   Signed, Park Liter, MD, [facility] 01/02/2018 3:08 PM    [place]

## 2018-01-02 NOTE — ED Notes (Signed)
cbg performed per RN request

## 2018-01-02 NOTE — Progress Notes (Signed)
Pharmacy Antibiotic Note  Lawrence [name] is a 72 y.o. male admitted on 01/02/2018 with sepsis.  Pharmacy has been consulted for vancomycin and Zosyn dosing. Pt is afebrile. Tachycardic with HR 120. Elevated WBC at 14.2, renal insufficiency with Scr 2.16, Normalized CrCl 31.5 ml/min.   Plan: Vancomycin 2000mg  IV x1 Vancomycin 1500mg  IV every 24 hours. Goal trough 15-20 mcg/mL Zosyn 3.375g IV x1  Zosyn 3.375g IV every 8 hours (4 hour infusion) F/u renal fxn, trough @ SS, clinical resolution  Height: 6' (182.9 cm) Weight: 267 lb (121.1 kg) IBW/kg (Calculated) : 77.6  Temp (24hrs), Avg:97.5 F (36.4 C), Min:97.5 F (36.4 C), Max:97.5 F (36.4 C)  Recent Labs  Lab 01/02/18 1515 01/02/18 1534  WBC 14.2*  --   CREATININE 2.16*  --   LATICACIDVEN  --  2.19*    Estimated Creatinine Clearance: 41.5 mL/min (A) (by C-G formula based on SCr of 2.16 mg/dL (H)).    Allergies  Allergen Reactions  . Bactrim [Sulfamethoxazole-Trimethoprim]     Antimicrobials this admission: Vancomycin 1/10 >>  Zosyn 1/10 >>   Microbiology results: Pending  Thank you for allowing pharmacy to be a part of this patient's care.  Kalene Cutler 01/02/2018 4:56 PM

## 2018-01-02 NOTE — ED Notes (Signed)
CBG 120 

## 2018-01-02 NOTE — ED Notes (Signed)
cbg 66

## 2018-01-02 NOTE — ED Notes (Signed)
cbg 95 

## 2018-01-02 NOTE — ED Notes (Signed)
Report given Alice, RN

## 2018-01-02 NOTE — ED Notes (Signed)
ED Provider at bedside. 

## 2018-01-02 NOTE — ED Notes (Signed)
cbg 157 

## 2018-01-02 NOTE — ED Notes (Signed)
cbg per RN Daphene Jaeger following glucagon

## 2018-01-02 NOTE — ED Notes (Signed)
cbg 67

## 2018-01-02 NOTE — H&P (Signed)
History and Physical    Lawrence [name] IWP:[id number] DOB: 12-Sep-1946 DOA: 01/02/2018  PCP: [name], MD Consultants:  [name] - cardiology; nephrology Patient coming from:  Home - lives with wife and mother-in-law; NOK: Wife, [phone]; [phone]  Chief Complaint:  SOB  HPI: Lawrence [name] is a 72 y.o. male with medical history significant of HFPEF 55-60% (09/17/17), COPD, diabetes on insulin and glimepiride; OSA on CPAP;   Diabetes x 7 years.  Hospitalized in the [facility] and he ended up with afib which ended up not reversing.   He ended up with a pacemaker in 8/18.  This past week, his sugars started bottoming out (glucose was 28 at home and 38 in cardiology office) and he is off all diabetic medications.  He has been having difficulty with his breathing the last month but worse in the last 2 weeks.  +cough, productive of yellow or clear phlegm the last few days.  Temp to 99.6 yesterday.  HR is usually in 65-70 range, in the 120s for the last few days.  +LE edema.  He was given Lasix and that worked really well today with good diuresis.  Currently he doesn't think his breathing is all that bad right now.  Denies chest pain.  ED Course: Suspect acute hypoxic respiratory failure requiring non invasive mechanical ventilation BIPAP 2/2 to CAP complicated by acute on chronic heart failure. Wbc 14k, HR 118, RR 36. Meets SIRS criteria with possible pneumonia as source of infection.   Review of Systems: As per HPI; otherwise review of systems reviewed and negative.   Ambulatory Status:  Ambulates with rolling walker  Past Medical History:  Diagnosis Date  . Anemia   . Chronic airway obstruction, not elsewhere classified   . Chronic ischemic heart disease, unspecified   . Coronary artery disease   . Diabetes mellitus    type II, neuropathy,   . Essential hypertension, benign   . Hyperlipidemia, mixed   . Nodule of kidney    incidentally found on CT abdomen 11/2012.   Pending Urology evaluation.   . Nontoxic multinodular goiter   . Obesity   . Other testicular hypofunction   . PUD (peptic ulcer disease)   . Renal insufficiency, mild    hx of    Past Surgical History:  Procedure Laterality Date  . CHOLECYSTECTOMY    . CORONARY ARTERY BYPASS GRAFT  2007   x 4  . ESOPHAGOGASTRODUODENOSCOPY     10/06/2012:  hiatal hernia, moderate gastritis.  2012: Colonoscopy at [facility]:  one polyp.  Due next 2017  . EYE SURGERY    . KNEE DEBRIDEMENT    . PACEMAKER IMPLANT N/A 08/22/2017   Procedure: Pacemaker Implant;  Surgeon: Constance [facility], MD;  Location: [facility];  Service: Cardiovascular;  Laterality: N/A;  . SHOULDER SURGERY     left shoulder  . TONSILECTOMY, ADENOIDECTOMY, BILATERAL MYRINGOTOMY AND TUBES      Social History   Socioeconomic History  . Marital status: Married    Spouse name: Not on file  . Number of children: 5  . Years of education: Not on file  . Highest education level: Not on file  Social Needs  . Financial resource strain: Not on file  . Food insecurity - worry: Not on file  . Food insecurity - inability: Not on file  . Transportation needs - medical: Not on file  . Transportation needs - non-medical: Not on file  Occupational History  .  Occupation: Retired    Comment: Medical illustrator; retired Chiropodist  Tobacco Use  . Smoking status: Former Smoker    Packs/day: 1.50    Years: 30.00    Pack years: 45.00    Types: Cigarettes    Last attempt to quit: 2006    Years since quitting: 13.0  . Smokeless tobacco: Former Systems developer    Types: Chew  Substance and Sexual Activity  . Alcohol use: Yes    Comment: rare  . Drug use: No  . Sexual activity: Not on file  Other Topics Concern  . Not on file  Social History Narrative   ** Merged History Encounter **       Lives with wife in a one story home.  Has 6 children.  Retired Nature conservation officer.  Education: college.    Allergies  Allergen Reactions  . Bactrim  [Sulfamethoxazole-Trimethoprim]     Family History  Problem Relation Age of Onset  . Colon cancer Mother   . Hypertension Mother   . Kidney cancer Mother   . Cancer Mother        colon  . Esophageal cancer Father   . Stroke Father   . Cancer Father        esophageal  . Asthma Sister   . Emphysema Unknown     Prior to Admission medications   Medication Sig Start Date End Date Taking? Authorizing Provider  amiodarone (PACERONE) 200 MG tablet Take 1 tablet (200 mg total) daily by mouth. 11/11/17   Park Liter, MD  apixaban (ELIQUIS) 5 MG TABS tablet Take 5 mg by mouth 2 (two) times daily.    [provider]  furosemide (LASIX) 20 MG tablet Take 40 mg by mouth daily.    [provider]  glimepiride (AMARYL) 4 MG tablet Take 2 mg by mouth 2 (two) times daily.     [provider]  insulin glargine (LANTUS) 100 UNIT/ML injection Inject 6 Units into the skin at bedtime.    [provider]  Ipratropium-Albuterol (COMBIVENT RESPIMAT) 20-100 MCG/ACT AERS respimat Inhale 1 puff into the lungs daily as needed for wheezing.    [provider]  ipratropium-albuterol (DUONEB) 0.5-2.5 (3) MG/3ML SOLN Take 3 mLs by nebulization as needed.    [provider]  linagliptin (TRADJENTA) 5 MG TABS tablet Take 5 mg by mouth daily.    [provider]  nebivolol (BYSTOLIC) 5 MG tablet Take 5 mg by mouth daily.    [provider]  nitroGLYCERIN (NITROSTAT) 0.4 MG SL tablet Place 0.4 mg under the tongue.    [provider]  pantoprazole (PROTONIX) 40 MG tablet Take 40 mg by mouth daily.     [provider]  simvastatin (ZOCOR) 20 MG tablet Take 20 mg by mouth at bedtime.     [provider]    Physical Exam: Vitals:   01/02/18 2300 01/03/18 0000 01/03/18 0019 01/03/18 0100  BP: 131/86 111/77 111/77 108/72  Pulse: (!) 120 (!) 107 (!) 108 (!) 106  Resp: (!) 25 (!) 32 (!) 25 (!) 28  Temp: 98.8 F (37.1  C)     TempSrc: Oral     SpO2: 97% 97% 99% 98%  Weight:      Height:         General:  Appears calm and comfortable and is NAD once BIPAP taken off Eyes:  PERRL, EOMI, normal lids, iris ENT:  grossly normal hearing, lips & tongue, mmm Neck:  no LAD,  masses or thyromegaly Cardiovascular:  Irregularly irregular, rate 120, no m/r/g. 3+ LE edema.  Respiratory:   CTA bilaterally with no wheezes/rales/rhonchi.  Normal respiratory effort. Abdomen:  soft, NT, ND, NABS Skin:  no rash or induration seen on limited exam Musculoskeletal:  grossly normal tone BUE/BLE, good ROM, no bony abnormality Psychiatric:  grossly normal mood and affect, speech fluent and appropriate, AOx3 Neurologic:  CN 2-12 grossly intact, moves all extremities in coordinated fashion, sensation intact   Radiological Exams on Admission: Dg Chest Portable 1 View  Result Date: 01/02/2018 CLINICAL DATA:  Sent from Dr Dorisann Frames office with SOB and CHF. His blood sugar was 36 in the office. He is cyanotic and dyspneic, hx of diabetes, coronary artery disease, HTN, hyperlipidemia, renal insufficiency EXAM: PORTABLE CHEST 1 VIEW COMPARISON:  Chest x-rays dated 12/04/2017 and 01/30/2017. FINDINGS: Mild cardiomegaly is stable. Left chest wall pacemaker hardware appears stable in positioning. Aortic atherosclerosis. Bilateral interstitial prominence, slightly increased compared to the previous studies indicating CHF superimposed on chronic interstitial lung disease. No confluent opacity to suggest a developing pneumonia. No pleural effusion or pneumothorax seen. Median sternotomy wires appear intact and stable in alignment. No acute or suspicious osseous finding. IMPRESSION: 1. Bilateral interstitial edema superimposed on chronic interstitial lung disease, indicating active mild CHF. 2. Stable mild cardiomegaly.  Pacemaker in place. 3. Aortic atherosclerosis. Electronically Signed   By: Franki Cabot M.D.   On: 01/02/2018 15:43    EKG:  Independently reviewed.  Afib with rate 117; nonspecific ST changes with no evidence of acute ischemia   Labs on Admission: I have personally reviewed the available labs and imaging studies at the time of the admission.  Pertinent labs:   Glucose 120, 95, 67, 157, 66, 36, 44, 104 ABG: 7.496/35.6/447 UA WNL Lactate 2.19 BUN 35/Creatinine 2.16/GFR 29 - improved AP 170 BNP 1559.8 Troponin 0.03 WBC 14.2 INR 2.11  Assessment/Plan Principal Problem:   Acute on chronic diastolic CHF (congestive heart failure) (HCC) Active Problems:   Hyperlipidemia, mixed   Essential hypertension, benign   Paroxysmal atrial fibrillation (HCC)   Hypoglycemia   Acute on chronic CHF -Patient presenting with worsening cough -CXR consistent with pulmonary edema -Mildly elevated WBC count and lactate so he was given antibiotics in the ER; CXR does not appear to indicate PNA and his clinical symptoms do not fit so will trend lactate and continue to follow without additional abx -Elevated BNP, higher than prior BNP levels -Tachycardia with his afib, improved now, will follow but could start Cardizem drip if he does not improve soon -With elevated BNP and abnl CXR, CHF seems most probable as diagnosis -Will admit to SDU with telemetry -Will request repeat echocardiogram - last done in 9/18 with preserved EF -Will start ASA -Will start Lisinopril 5 mg daily (BP is normal to high and so should support addition of medication) -No beta blocker due to bradycardia on presentation -CHF order set utilized; may need CHF team consult but will hold until Echo results are available -Was given Lasix 80 mg x 1 in ER and will repeat with 80 mg IV BID -Continue [place] O2 for now - he was on BIPAP and will continue this prn but at the time of my evaluation he was taken off BIPAP and was much more comfortable with the [place] O2 -Will r/o with serial troponins although doubt ACS based on symptoms -Continue Eliquis, Amiodarone for  PAF  Hypoglycemia, h/o DM -Patient with h/o DM but no recent A1c -Hypoglycemia has  been present for the last few days despite going off PO medications -He was started on a D10 infusion and yet has still had periodic hypoglycemic episodes -This is unusual and may prompt further evaluation (insulinoma?) if not improving with treatment of other medical problems -Hypoglycemic order set ordered  HTN -Continue Bystolic  HLD -Continue Zocor   DVT prophylaxis: Eliquis Code Status:  Full - confirmed with patient Family Communication: None present  Disposition Plan:  Home once clinically improved Consults called: None  Admission status: Admit - It is my clinical opinion that admission to INPATIENT is reasonable and necessary because of the expectation that this patient will require hospital care that crosses at least 2 midnights to treat this condition based on the medical complexity of the problems presented.  Given the aforementioned information, the predictability of an adverse outcome is felt to be significant.    Karmen Bongo MD [facility]  If note is complete, please contact covering daytime or nighttime physician. [email or web address] Password [email or web address]  01/03/2018, 2:04 AM

## 2018-01-02 NOTE — ED Notes (Signed)
Zacowski states to give another amp of d50.

## 2018-01-02 NOTE — Progress Notes (Signed)
72 yo M PMH significant  For chronic HFPEF 55-60% (09/17/17), COPD, diabetes on insulin and glimepiride presents to his cardiologist due to worsening generalized weakness of 2 weeks duration now associated with chills. Hypothermic. Hypoxic with O2 sat in the 80's and hypoglycemic with BS in the 30's at his cardiologist's office. Referred to [facility] for further evaluation. Suspect acute hypoxic respiratory failure requiring non invasive mechanical ventilation BIPAP 2/2 to CAP complicated by acute on chronic heart failure. Wbc 14k, HR 118, RR 36. Meets SIRS criteria with possible pneumonia as source of infection.   Admit to step down unit inpatient status- [facility].

## 2018-01-02 NOTE — ED Provider Notes (Addendum)
[facility] EMERGENCY DEPARTMENT Provider Note   CSN: [id number] Arrival date & time: 01/02/18  1504     History   Chief Complaint Chief Complaint  Patient presents with  . Shortness of Breath    HPI Lawrence [name] is a 72 y.o. male.  Patient had routine outpatient visit with his cardiologist upstairs today.  Patient was noted to be in significant respiratory distress and severe hypoglycemia.  The patient in addition was ill-appearing.  Patient sent down here to the emergency department for further evaluation.  Patient has a history of significant diastolic CHF.  Does have a history of some COPD.  Patient recently had an admission for CHF exacerbation just in December at [facility].  Patient seen by primary care doctor yesterday and empirically it was given a shot of antibiotic and started on doxycycline for presumed pneumonia.  Patient's been having difficulty breathing for about 2 weeks.  Patient starting last evening and throughout the night had significant difficulties controlling his blood sugars.  Patient has not been on any diabetic medicine or insulin since Tuesday.  Patient's blood sugar up in the office was extremely low he was given oral glucose tablets as well as candidate snacks.  Blood sugar still low when he arrived here.  Patient in obvious distress upon arrival.      Past Medical History:  Diagnosis Date  . Anemia   . Chronic airway obstruction, not elsewhere classified   . Chronic ischemic heart disease, unspecified   . Coronary artery disease   . Diabetes mellitus    type II, neuropathy,   . Diabetes mellitus without complication ([place])   . Essential hypertension, benign   . Hyperlipidemia   . Hyperlipidemia, mixed   . Hypertension   . Nodule of kidney    incidentally found on CT abdomen 11/2012.  Pending Urology evaluation.   . Nontoxic multinodular goiter   . Obesity   . Other testicular hypofunction   . PUD (peptic ulcer disease)   .  Renal insufficiency, mild    hx of    Patient Active Problem List   Diagnosis Date Noted  . Hypoglycemia 01/02/2018  . CHF (congestive heart failure), NYHA class II, chronic, diastolic ([place]) 62/37/6283  . Pacemaker 09/13/2017  . Bradycardia with 31-40 beats per minute 08/21/2017  . Paroxysmal atrial fibrillation ([place]) 06/25/2017  . Anemia, iron deficiency 12/28/2014  . Renal lesion 05/25/2014  . Morbid obesity ([place]) 03/10/2013  . COPD (chronic obstructive pulmonary disease) ([place]) 01/22/2013  . Anemia   . Coronary artery disease   . Hyperlipidemia, mixed   . Essential hypertension, benign     Past Surgical History:  Procedure Laterality Date  . CHOLECYSTECTOMY    . CORONARY ARTERY BYPASS GRAFT  2007   x 4  . CORONARY ARTERY BYPASS GRAFT    . ESOPHAGOGASTRODUODENOSCOPY     10/06/2012:  hiatal hernia, moderate gastritis.  2012: Colonoscopy at [facility]:  one polyp.  Due next 2017  . EYE SURGERY    . KNEE DEBRIDEMENT    . PACEMAKER IMPLANT N/A 08/22/2017   Procedure: Pacemaker Implant;  Surgeon: Constance Haw, MD;  Location: [facility];  Service: Cardiovascular;  Laterality: N/A;  . SHOULDER SURGERY     left shoulder  . SHOULDER SURGERY    . TONSILECTOMY, ADENOIDECTOMY, BILATERAL MYRINGOTOMY AND TUBES         Home Medications    Prior to Admission medications   Medication Sig Start Date End  Date Taking? Authorizing Provider  amiodarone (PACERONE) 200 MG tablet Take 1 tablet (200 mg total) daily by mouth. 11/11/17   Park Liter, MD  apixaban (ELIQUIS) 5 MG TABS tablet Take 5 mg by mouth 2 (two) times daily.    [provider]  furosemide (LASIX) 20 MG tablet Take 40 mg by mouth daily.    [provider]  glimepiride (AMARYL) 4 MG tablet Take 2 mg by mouth 2 (two) times daily.     [provider]  insulin glargine (LANTUS) 100 UNIT/ML injection Inject 6 Units into the skin at bedtime.    [provider]    Ipratropium-Albuterol (COMBIVENT RESPIMAT) 20-100 MCG/ACT AERS respimat Inhale 1 puff into the lungs daily as needed for wheezing.    [provider]  ipratropium-albuterol (DUONEB) 0.5-2.5 (3) MG/3ML SOLN Take 3 mLs by nebulization as needed.    [provider]  linagliptin (TRADJENTA) 5 MG TABS tablet Take 5 mg by mouth daily.    [provider]  nebivolol (BYSTOLIC) 5 MG tablet Take 5 mg by mouth daily.    [provider]  nitroGLYCERIN (NITROSTAT) 0.4 MG SL tablet Place 0.4 mg under the tongue.    [provider]  pantoprazole (PROTONIX) 40 MG tablet Take 40 mg by mouth daily.     [provider]  simvastatin (ZOCOR) 20 MG tablet Take 20 mg by mouth at bedtime.     [provider]    Family History Family History  Problem Relation Age of Onset  . Colon cancer Mother   . Hypertension Mother   . Kidney cancer Mother   . Cancer Mother        colon  . Esophageal cancer Father   . Stroke Father   . Cancer Father        esophageal  . Asthma Sister   . Emphysema Unknown     Social History Social History   Tobacco Use  . Smoking status: Former Smoker    Packs/day: 1.50    Years: 30.00    Pack years: 45.00    Types: Cigarettes  . Smokeless tobacco: Former Systems developer    Types: Chew  Substance Use Topics  . Alcohol use: Yes  . Drug use: No     Allergies   Bactrim [sulfamethoxazole-trimethoprim]   Review of Systems Review of Systems  Constitutional: Positive for chills, diaphoresis and fatigue.  HENT: Positive for congestion.   Eyes: Negative for redness.  Respiratory: Positive for shortness of breath.   Cardiovascular: Negative for chest pain.  Gastrointestinal: Negative for abdominal pain, nausea and vomiting.  Genitourinary: Negative for dysuria.  Musculoskeletal: Negative for myalgias.  Skin: Negative for rash.  Neurological: Negative for syncope and headaches.  Hematological: Does not bruise/bleed  easily.  Psychiatric/Behavioral: Positive for sleep disturbance. Negative for confusion.     Physical Exam Updated Vital Signs BP 101/86   Pulse (!) 120   Temp (!) 97.5 F (36.4 C) (Oral) Comment: his skin is hot to touch. rectal temp will be done  Resp 17   Ht 1.829 m (6')   Wt 121.1 kg (267 lb)   SpO2 99%   BMI 36.21 kg/m   Physical Exam  Constitutional: He is oriented to person, place, and time. He appears well-developed and well-nourished. He appears distressed.  HENT:  Head: Normocephalic and atraumatic.  Mucous membranes slightly dry  Eyes: EOM are normal. Pupils are equal, round, and reactive to light.  Neck: Neck supple.  Cardiovascular: Normal rate.  Irregular  Pulmonary/Chest: He is in respiratory distress. He has wheezes.  Abdominal: Soft. Bowel sounds are normal. There is no tenderness.  Musculoskeletal: Normal range of motion. He exhibits edema.  Neurological: He is alert and oriented to person, place, and time. No cranial nerve deficit or sensory deficit. He exhibits normal muscle tone. Coordination normal.  Skin: He is not diaphoretic.  Cool to touch  Nursing note and vitals reviewed.    ED Treatments / Results  Labs (all labs ordered are listed, but only abnormal results are displayed) Labs Reviewed  BASIC METABOLIC PANEL - Abnormal; Notable for the following components:      Result Value   Chloride 95 (*)    Glucose, Bld 51 (*)    BUN 35 (*)    Creatinine, Ser 2.16 (*)    Calcium 8.8 (*)    GFR calc non Af Amer 29 (*)    GFR calc Af Amer 33 (*)    All other components within normal limits  CBC - Abnormal; Notable for the following components:   WBC 14.2 (*)    RDW 17.0 (*)    All other components within normal limits  TROPONIN I - Abnormal; Notable for the following components:   Troponin I 0.03 (*)    All other components within normal limits  PROTIME-INR - Abnormal; Notable for the following components:   Prothrombin Time 23.5 (*)    All  other components within normal limits  HEPATIC FUNCTION PANEL - Abnormal; Notable for the following components:   AST 51 (*)    Alkaline Phosphatase 170 (*)    All other components within normal limits  I-STAT CG4 LACTIC ACID, ED - Abnormal; Notable for the following components:   Lactic Acid, Venous 2.19 (*)    All other components within normal limits  I-STAT ARTERIAL BLOOD GAS, ED - Abnormal; Notable for the following components:   pH, Arterial 7.496 (*)    pO2, Arterial 447.0 (*)    Acid-Base Excess 4.0 (*)    All other components within normal limits  CBG MONITORING, ED - Abnormal; Notable for the following components:   Glucose-Capillary 33 (*)    All other components within normal limits  CBG MONITORING, ED - Abnormal; Notable for the following components:   Glucose-Capillary 128 (*)    All other components within normal limits  CBG MONITORING, ED - Abnormal; Notable for the following components:   Glucose-Capillary 120 (*)    All other components within normal limits  CULTURE, BLOOD (ROUTINE X 2)  CULTURE, BLOOD (ROUTINE X 2)  URINE CULTURE  LIPASE, BLOOD  URINALYSIS, ROUTINE W REFLEX MICROSCOPIC  BRAIN NATRIURETIC PEPTIDE  CBG MONITORING, ED  CBG MONITORING, ED    EKG  EKG Interpretation  Date/Time:  Thursday January 02 2018 15:19:42 EST Ventricular Rate:  117 PR Interval:    QRS Duration: 92 QT Interval:  358 QTC Calculation: 487 R Axis:   99 Text Interpretation:  Atrial fibrillation Low voltage, extremity and precordial leads Probable anteroseptal infarct, old Borderline ST depression, anterolateral leads Interpretation limited secondary to artifact Confirmed by Fredia Sorrow [phone]) on 01/02/2018 3:31:57 PM       Radiology Dg Chest Portable 1 View  Result Date: 01/02/2018 CLINICAL DATA:  Sent from Dr Dorisann Frames office with SOB and CHF. His blood sugar was 36 in the office. He is cyanotic and dyspneic, hx of diabetes, coronary artery disease, HTN,  hyperlipidemia, renal insufficiency EXAM: PORTABLE CHEST 1 VIEW  COMPARISON:  Chest x-rays dated 12/04/2017 and 01/30/2017. FINDINGS: Mild cardiomegaly is stable. Left chest wall pacemaker hardware appears stable in positioning. Aortic atherosclerosis. Bilateral interstitial prominence, slightly increased compared to the previous studies indicating CHF superimposed on chronic interstitial lung disease. No confluent opacity to suggest a developing pneumonia. No pleural effusion or pneumothorax seen. Median sternotomy wires appear intact and stable in alignment. No acute or suspicious osseous finding. IMPRESSION: 1. Bilateral interstitial edema superimposed on chronic interstitial lung disease, indicating active mild CHF. 2. Stable mild cardiomegaly.  Pacemaker in place. 3. Aortic atherosclerosis. Electronically Signed   By: Franki Cabot M.D.   On: 01/02/2018 15:43    Procedures Procedures (including critical care time)  CRITICAL CARE Performed by: Fredia Sorrow Total critical care time: 90 minutes Critical care time was exclusive of separately billable procedures and treating other patients. Critical care was necessary to treat or prevent imminent or life-threatening deterioration. Critical care was time spent personally by me on the following activities: development of treatment plan with patient and/or surrogate as well as nursing, discussions with consultants, evaluation of patient's response to treatment, examination of patient, obtaining history from patient or surrogate, ordering and performing treatments and interventions, ordering and review of laboratory studies, ordering and review of radiographic studies, pulse oximetry and re-evaluation of patient's condition.  Medications Ordered in ED Medications  dextrose 10 % infusion ( Intravenous New Bag/Given 01/02/18 1540)  ipratropium-albuterol (DUONEB) 0.5-2.5 (3) MG/3ML nebulizer solution 3 mL (3 mLs Nebulization Given 01/02/18 1559)    vancomycin (VANCOCIN) IVPB 1000 mg/200 mL premix (not administered)    Followed by  vancomycin (VANCOCIN) IVPB 1000 mg/200 mL premix (1,000 mg Intravenous New Bag/Given 01/02/18 1615)  dextrose 50 % solution (50 mLs  Given 01/02/18 1520)  dextrose 50 % solution 50 mL (50 mLs Intravenous Given 01/02/18 1530)  furosemide (LASIX) injection 80 mg (80 mg Intravenous Given 01/02/18 1601)  piperacillin-tazobactam (ZOSYN) IVPB 3.375 g (3.375 g Intravenous New Bag/Given 01/02/18 1615)     Initial Impression / Assessment and Plan / ED Course  I have reviewed the triage vital signs and the nursing notes.  Pertinent labs & imaging results that were available during my care of the patient were reviewed by me and considered in my medical decision making (see chart for details).    Patient presented in significant distress.  Had chills blood sugar was in the 30s.  Patient having respiratory distress.  Cannot get his breath.  Patient was on 100% nonrebreather.  Patient hypothermic.  Patient room air sats would go down into the 80s.  So is showing signs of hypoxia.  For the low blood sugar patient received an amp of D50.  Blood sugars are still low following this.  Patient received a second amp of D50 and was started on a D10 drip at 75 cc an hour.  This helped stabilize the blood sugars.  Did have blood sugar of 85 and low 100s.  Patient's chills also said corrected with the blood sugar correction and he also started to get warmer.  Patient was covered with warm blankets.  For the respiratory distress patient was switched from 100% nonrebreather to BiPAP.  For the wheezing patient received albuterol Atrovent nebulizer.  Chest x-ray portable was consistent with CHF patient received 80 mg of Lasix IV.  These interventions significantly improved his breathing.  Arterial blood gas on BiPAP showed a normal pH and normal PCO2.   In addition we had a history of patient being started on antibiotics  yesterday received  something IM probably Rocephin and is started on doxycycline.  Patient may very well be septic.  That could explain the hypoglycemia and hypothermia.  Patient's lactic acid was above 2 but less than 4.  Patient had slight elevation in troponin.  Slight elevation and a white blood cell count.  Patient denied any chest pain however his troponin was slightly elevated at 0.03.  Patient is followed by [facility] cardiology.  And since this is also an exacerbation of his CHF is probably best that he be transferred and admitted at [facility] where cardiology can more easily be involved.  For the concern for sepsis patient did receive broad-spectrum antibiotics to include Zosyn and vancomycin as well he was cultured up.    Final Clinical Impressions(s) / ED Diagnoses   Final diagnoses:  SOB (shortness of breath)  Hypothermia, initial encounter  Hypoglycemia  Acute respiratory failure with hypoxia (HCC)  Acute on chronic diastolic congestive heart failure (HCC)  Sepsis, due to unspecified organism [facility])    ED Discharge Orders    None       Fredia Sorrow, MD 01/02/18 1703   Addendum:  Discussed with [facility] hospitalist Dr. Nevada Crane at [facility] they will admit to stepdown.  [facility] will transfer.  Patient overall doing much better has stabilized out very well.    Fredia Sorrow, MD 01/02/18 [phone]

## 2018-01-02 NOTE — Progress Notes (Signed)
Patient transferred from [facility] via ambulance.  Patient admitted into room 59m06.  Patient remains on Bipap at 40%.  D10 infusing at 75 mls/hr. CHG wipedown performed, MRSA nares swab obtained.  External cath placed.  Leg strap in place.  Amber urine noted in foley drainage bag.  No complaints of pain.  Will continue to monitor.  Dr. Present to perform admission orders.

## 2018-01-02 NOTE — ED Notes (Signed)
cbg per RN Daphene Jaeger

## 2018-01-02 NOTE — ED Triage Notes (Signed)
He was sent from Dr Dorisann Frames office with SOB and CHF. His blood sugar was 36 in the office. He is cyanotic and dyspneic at registration. Pt was brought to tx room via w/c prior to triage.

## 2018-01-03 ENCOUNTER — Inpatient Hospital Stay (HOSPITAL_COMMUNITY): Payer: Medicare Other

## 2018-01-03 ENCOUNTER — Other Ambulatory Visit (HOSPITAL_COMMUNITY): Payer: Medicare Other

## 2018-01-03 DIAGNOSIS — E118 Type 2 diabetes mellitus with unspecified complications: Secondary | ICD-10-CM

## 2018-01-03 DIAGNOSIS — I5033 Acute on chronic diastolic (congestive) heart failure: Secondary | ICD-10-CM | POA: Diagnosis present

## 2018-01-03 DIAGNOSIS — R6510 Systemic inflammatory response syndrome (SIRS) of non-infectious origin without acute organ dysfunction: Secondary | ICD-10-CM

## 2018-01-03 DIAGNOSIS — J181 Lobar pneumonia, unspecified organism: Secondary | ICD-10-CM

## 2018-01-03 DIAGNOSIS — I5030 Unspecified diastolic (congestive) heart failure: Secondary | ICD-10-CM

## 2018-01-03 DIAGNOSIS — I480 Paroxysmal atrial fibrillation: Secondary | ICD-10-CM

## 2018-01-03 DIAGNOSIS — I361 Nonrheumatic tricuspid (valve) insufficiency: Secondary | ICD-10-CM

## 2018-01-03 DIAGNOSIS — I10 Essential (primary) hypertension: Secondary | ICD-10-CM

## 2018-01-03 HISTORY — DX: Acute on chronic diastolic (congestive) heart failure: I50.33

## 2018-01-03 LAB — ECHOCARDIOGRAM COMPLETE
AOASC: 31 cm
E decel time: 130 msec
FS: 15 % — AB (ref 28–44)
HEIGHTINCHES: 71 in
IV/PV OW: 0.91
LA ID, A-P, ES: 46 mm
LA diam end sys: 46 mm
LA diam index: 1.82 cm/m2
LA vol A4C: 72 ml
LAVOL: 68.2 mL
LAVOLIN: 27 mL/m2
LV PW d: 13.7 mm — AB (ref 0.6–1.1)
LVOT area: 2.54 cm2
LVOT diameter: 18 mm
MV Dec: 130
MVPG: 4 mmHg
MVPKEVEL: 94.1 m/s
Reg peak vel: 243 cm/s
TAPSE: 12.2 mm
TR max vel: 243 cm/s
WEIGHTICAEL: 4310.43 [oz_av]

## 2018-01-03 LAB — GLUCOSE, CAPILLARY
GLUCOSE-CAPILLARY: 276 mg/dL — AB (ref 65–99)
GLUCOSE-CAPILLARY: 36 mg/dL — AB (ref 65–99)
GLUCOSE-CAPILLARY: 93 mg/dL (ref 65–99)
Glucose-Capillary: 104 mg/dL — ABNORMAL HIGH (ref 65–99)
Glucose-Capillary: 127 mg/dL — ABNORMAL HIGH (ref 65–99)
Glucose-Capillary: 232 mg/dL — ABNORMAL HIGH (ref 65–99)
Glucose-Capillary: 365 mg/dL — ABNORMAL HIGH (ref 65–99)
Glucose-Capillary: 44 mg/dL — CL (ref 65–99)
Glucose-Capillary: 45 mg/dL — ABNORMAL LOW (ref 65–99)
Glucose-Capillary: 48 mg/dL — ABNORMAL LOW (ref 65–99)
Glucose-Capillary: 56 mg/dL — ABNORMAL LOW (ref 65–99)

## 2018-01-03 LAB — CBC WITH DIFFERENTIAL/PLATELET
Basophils Absolute: 0 10*3/uL (ref 0.0–0.1)
Basophils Relative: 0 %
EOS PCT: 1 %
Eosinophils Absolute: 0.1 10*3/uL (ref 0.0–0.7)
HCT: 33.9 % — ABNORMAL LOW (ref 39.0–52.0)
HEMOGLOBIN: 10.4 g/dL — AB (ref 13.0–17.0)
LYMPHS ABS: 0.8 10*3/uL (ref 0.7–4.0)
LYMPHS PCT: 8 %
MCH: 28.7 pg (ref 26.0–34.0)
MCHC: 30.7 g/dL (ref 30.0–36.0)
MCV: 93.4 fL (ref 78.0–100.0)
Monocytes Absolute: 1.4 10*3/uL — ABNORMAL HIGH (ref 0.1–1.0)
Monocytes Relative: 14 %
Neutro Abs: 7.2 10*3/uL (ref 1.7–7.7)
Neutrophils Relative %: 77 %
PLATELETS: 142 10*3/uL — AB (ref 150–400)
RBC: 3.63 MIL/uL — ABNORMAL LOW (ref 4.22–5.81)
RDW: 16.7 % — ABNORMAL HIGH (ref 11.5–15.5)
WBC: 9.5 10*3/uL (ref 4.0–10.5)

## 2018-01-03 LAB — RESPIRATORY PANEL BY PCR
ADENOVIRUS-RVPPCR: NOT DETECTED
Bordetella pertussis: NOT DETECTED
CORONAVIRUS 229E-RVPPCR: NOT DETECTED
CORONAVIRUS HKU1-RVPPCR: NOT DETECTED
CORONAVIRUS NL63-RVPPCR: NOT DETECTED
CORONAVIRUS OC43-RVPPCR: DETECTED — AB
Chlamydophila pneumoniae: NOT DETECTED
INFLUENZA A-RVPPCR: NOT DETECTED
Influenza B: NOT DETECTED
MYCOPLASMA PNEUMONIAE-RVPPCR: NOT DETECTED
Metapneumovirus: NOT DETECTED
PARAINFLUENZA VIRUS 1-RVPPCR: NOT DETECTED
PARAINFLUENZA VIRUS 4-RVPPCR: NOT DETECTED
Parainfluenza Virus 2: NOT DETECTED
Parainfluenza Virus 3: NOT DETECTED
Respiratory Syncytial Virus: NOT DETECTED
Rhinovirus / Enterovirus: NOT DETECTED

## 2018-01-03 LAB — MRSA PCR SCREENING: MRSA by PCR: NEGATIVE

## 2018-01-03 LAB — COMPREHENSIVE METABOLIC PANEL
ALK PHOS: 134 U/L — AB (ref 38–126)
ALT: 26 U/L (ref 17–63)
ANION GAP: 13 (ref 5–15)
AST: 46 U/L — ABNORMAL HIGH (ref 15–41)
Albumin: 3 g/dL — ABNORMAL LOW (ref 3.5–5.0)
BUN: 32 mg/dL — ABNORMAL HIGH (ref 6–20)
CALCIUM: 8.2 mg/dL — AB (ref 8.9–10.3)
CHLORIDE: 98 mmol/L — AB (ref 101–111)
CO2: 25 mmol/L (ref 22–32)
Creatinine, Ser: 2.2 mg/dL — ABNORMAL HIGH (ref 0.61–1.24)
GFR calc non Af Amer: 28 mL/min — ABNORMAL LOW (ref >60)
GFR, EST AFRICAN AMERICAN: 33 mL/min — AB (ref >60)
Glucose, Bld: 104 mg/dL — ABNORMAL HIGH (ref 65–99)
Potassium: 3.6 mmol/L (ref 3.5–5.1)
SODIUM: 136 mmol/L (ref 135–145)
Total Bilirubin: 1.3 mg/dL — ABNORMAL HIGH (ref 0.3–1.2)
Total Protein: 5.9 g/dL — ABNORMAL LOW (ref 6.5–8.1)

## 2018-01-03 LAB — TSH: TSH: 6.384 u[IU]/mL — AB (ref 0.350–4.500)

## 2018-01-03 LAB — TROPONIN I
TROPONIN I: 0.03 ng/mL — AB (ref <0.03)
TROPONIN I: 0.03 ng/mL — AB (ref <0.03)
Troponin I: 0.04 ng/mL (ref <0.03)

## 2018-01-03 LAB — MAGNESIUM: Magnesium: 2 mg/dL (ref 1.7–2.4)

## 2018-01-03 LAB — HEMOGLOBIN A1C
Hgb A1c MFr Bld: 5.6 % (ref 4.8–5.6)
Mean Plasma Glucose: 114.02 mg/dL

## 2018-01-03 LAB — CBG MONITORING, ED: Glucose-Capillary: 33 mg/dL — CL (ref 65–99)

## 2018-01-03 LAB — LACTIC ACID, PLASMA
LACTIC ACID, VENOUS: 1 mmol/L (ref 0.5–1.9)
LACTIC ACID, VENOUS: 1.3 mmol/L (ref 0.5–1.9)
LACTIC ACID, VENOUS: 1.6 mmol/L (ref 0.5–1.9)
Lactic Acid, Venous: 0.9 mmol/L (ref 0.5–1.9)

## 2018-01-03 MED ORDER — AMIODARONE HCL 200 MG PO TABS
200.0000 mg | ORAL_TABLET | Freq: Every day | ORAL | Status: DC
  Administered 2018-01-03 – 2018-01-13 (×11): 200 mg via ORAL
  Filled 2018-01-03 (×11): qty 1

## 2018-01-03 MED ORDER — IPRATROPIUM-ALBUTEROL 0.5-2.5 (3) MG/3ML IN SOLN
3.0000 mL | Freq: Every day | RESPIRATORY_TRACT | Status: DC | PRN
  Administered 2018-01-05: 3 mL via RESPIRATORY_TRACT
  Filled 2018-01-03: qty 3

## 2018-01-03 MED ORDER — DEXTROSE 5 % IV SOLN
1.0000 g | INTRAVENOUS | Status: AC
  Administered 2018-01-03 – 2018-01-09 (×7): 1 g via INTRAVENOUS
  Filled 2018-01-03 (×8): qty 10

## 2018-01-03 MED ORDER — ASPIRIN EC 81 MG PO TBEC
81.0000 mg | DELAYED_RELEASE_TABLET | Freq: Every day | ORAL | Status: DC
  Administered 2018-01-03 – 2018-01-13 (×11): 81 mg via ORAL
  Filled 2018-01-03 (×11): qty 1

## 2018-01-03 MED ORDER — IPRATROPIUM-ALBUTEROL 0.5-2.5 (3) MG/3ML IN SOLN: 3.0000 mL | Freq: Four times a day (QID) | RESPIRATORY_TRACT | Status: DC

## 2018-01-03 MED ORDER — LORAZEPAM 2 MG/ML IJ SOLN
1.0000 mg | Freq: Once | INTRAMUSCULAR | Status: AC
  Administered 2018-01-03: 1 mg via INTRAVENOUS
  Filled 2018-01-03: qty 1

## 2018-01-03 MED ORDER — DEXTROSE 50 % IV SOLN
INTRAVENOUS | Status: AC
Start: 2018-01-03 — End: 2018-01-03
  Administered 2018-01-03: 04:00:00
  Filled 2018-01-03: qty 50

## 2018-01-03 MED ORDER — DEXTROSE 50 % IV SOLN
INTRAVENOUS | Status: AC
  Administered 2018-01-03: 25 g via INTRAVENOUS
  Filled 2018-01-03: qty 50

## 2018-01-03 MED ORDER — GUAIFENESIN-CODEINE 100-10 MG/5ML PO SOLN
10.0000 mL | ORAL | Status: DC | PRN
  Administered 2018-01-03 – 2018-01-13 (×18): 10 mL via ORAL
  Filled 2018-01-03 (×18): qty 10

## 2018-01-03 MED ORDER — ONDANSETRON HCL 4 MG/2ML IJ SOLN
4.0000 mg | Freq: Four times a day (QID) | INTRAMUSCULAR | Status: DC | PRN
  Administered 2018-01-03 (×2): 4 mg via INTRAVENOUS
  Filled 2018-01-03 (×2): qty 2

## 2018-01-03 MED ORDER — APIXABAN 5 MG PO TABS
5.0000 mg | ORAL_TABLET | Freq: Two times a day (BID) | ORAL | Status: DC
  Administered 2018-01-03 – 2018-01-13 (×21): 5 mg via ORAL
  Filled 2018-01-03 (×22): qty 1

## 2018-01-03 MED ORDER — METHYLPREDNISOLONE SODIUM SUCC 125 MG IJ SOLR
60.0000 mg | Freq: Two times a day (BID) | INTRAMUSCULAR | Status: DC
  Administered 2018-01-03 – 2018-01-04 (×2): 60 mg via INTRAVENOUS
  Filled 2018-01-03 (×2): qty 2

## 2018-01-03 MED ORDER — INSULIN ASPART 100 UNIT/ML ~~LOC~~ SOLN
0.0000 [IU] | Freq: Three times a day (TID) | SUBCUTANEOUS | Status: DC
  Administered 2018-01-03: 2 [IU] via SUBCUTANEOUS
  Administered 2018-01-03: 5 [IU] via SUBCUTANEOUS
  Administered 2018-01-04: 9 [IU] via SUBCUTANEOUS

## 2018-01-03 MED ORDER — SODIUM CHLORIDE 0.9% FLUSH
3.0000 mL | Freq: Two times a day (BID) | INTRAVENOUS | Status: DC
  Administered 2018-01-03 – 2018-01-05 (×4): 3 mL via INTRAVENOUS

## 2018-01-03 MED ORDER — FUROSEMIDE 10 MG/ML IJ SOLN
40.0000 mg | Freq: Two times a day (BID) | INTRAMUSCULAR | Status: DC
  Administered 2018-01-03: 40 mg via INTRAVENOUS
  Filled 2018-01-03: qty 4

## 2018-01-03 MED ORDER — NEBIVOLOL HCL 5 MG PO TABS
5.0000 mg | ORAL_TABLET | Freq: Every day | ORAL | Status: DC
  Administered 2018-01-03 – 2018-01-13 (×11): 5 mg via ORAL
  Filled 2018-01-03 (×11): qty 1

## 2018-01-03 MED ORDER — SODIUM CHLORIDE 0.9% FLUSH: 3.0000 mL | INTRAVENOUS | Status: DC | PRN

## 2018-01-03 MED ORDER — DM-GUAIFENESIN ER 30-600 MG PO TB12
1.0000 | ORAL_TABLET | Freq: Two times a day (BID) | ORAL | Status: DC
  Administered 2018-01-03 – 2018-01-12 (×19): 1 via ORAL
  Filled 2018-01-03 (×20): qty 1

## 2018-01-03 MED ORDER — SIMVASTATIN 20 MG PO TABS
20.0000 mg | ORAL_TABLET | Freq: Every day | ORAL | Status: DC
  Administered 2018-01-03 – 2018-01-12 (×10): 20 mg via ORAL
  Filled 2018-01-03 (×10): qty 1

## 2018-01-03 MED ORDER — DEXTROSE 5 % IV SOLN
500.0000 mg | INTRAVENOUS | Status: AC
  Administered 2018-01-03 – 2018-01-09 (×7): 500 mg via INTRAVENOUS
  Filled 2018-01-03 (×8): qty 500

## 2018-01-03 MED ORDER — IPRATROPIUM-ALBUTEROL 20-100 MCG/ACT IN AERS: 1.0000 | INHALATION_SPRAY | Freq: Every day | RESPIRATORY_TRACT | Status: DC | PRN

## 2018-01-03 MED ORDER — SODIUM CHLORIDE 0.9 % IV SOLN: 250.0000 mL | INTRAVENOUS | Status: DC | PRN

## 2018-01-03 MED ORDER — DEXTROSE 50 % IV SOLN
100.0000 g | Freq: Once | INTRAVENOUS | Status: AC
  Administered 2018-01-03: 100 g via INTRAVENOUS

## 2018-01-03 MED ORDER — DEXTROSE 50 % IV SOLN
50.0000 g | Freq: Once | INTRAVENOUS | Status: AC
  Administered 2018-01-03 (×2): 25 g via INTRAVENOUS
  Filled 2018-01-03: qty 50

## 2018-01-03 MED ORDER — PERFLUTREN LIPID MICROSPHERE
1.0000 mL | INTRAVENOUS | Status: AC | PRN
  Administered 2018-01-03: 3 mL via INTRAVENOUS
  Filled 2018-01-03: qty 10

## 2018-01-03 MED ORDER — ACETAMINOPHEN 325 MG PO TABS
650.0000 mg | ORAL_TABLET | ORAL | Status: DC | PRN
  Administered 2018-01-04: 650 mg via ORAL
  Filled 2018-01-03: qty 2

## 2018-01-03 MED ORDER — DEXTROSE 50 % IV SOLN
1.0000 | Freq: Once | INTRAVENOUS | Status: AC
  Administered 2018-01-03: 50 mL via INTRAVENOUS

## 2018-01-03 MED ORDER — METOPROLOL TARTRATE 5 MG/5ML IV SOLN
5.0000 mg | Freq: Once | INTRAVENOUS | Status: AC
  Administered 2018-01-03: 5 mg via INTRAVENOUS
  Filled 2018-01-03: qty 5

## 2018-01-03 MED ORDER — ALBUMIN HUMAN 25 % IV SOLN
50.0000 g | Freq: Once | INTRAVENOUS | Status: AC
  Administered 2018-01-03: 50 g via INTRAVENOUS
  Filled 2018-01-03: qty 50

## 2018-01-03 MED ORDER — LISINOPRIL 2.5 MG PO TABS
2.5000 mg | ORAL_TABLET | Freq: Every day | ORAL | Status: DC
  Administered 2018-01-03: 2.5 mg via ORAL
  Filled 2018-01-03 (×2): qty 1

## 2018-01-03 MED ORDER — PANTOPRAZOLE SODIUM 40 MG PO TBEC
40.0000 mg | DELAYED_RELEASE_TABLET | Freq: Every day | ORAL | Status: DC
  Administered 2018-01-03 – 2018-01-13 (×11): 40 mg via ORAL
  Filled 2018-01-03 (×11): qty 1

## 2018-01-03 MED ORDER — ACETAMINOPHEN 650 MG RE SUPP
650.0000 mg | Freq: Four times a day (QID) | RECTAL | Status: DC | PRN
Start: 2018-01-03 — End: 2018-01-06
  Administered 2018-01-03: 650 mg via RECTAL
  Filled 2018-01-03: qty 1

## 2018-01-03 MED ORDER — FUROSEMIDE 10 MG/ML IJ SOLN
80.0000 mg | Freq: Two times a day (BID) | INTRAMUSCULAR | Status: DC
  Administered 2018-01-03: 80 mg via INTRAVENOUS
  Filled 2018-01-03 (×2): qty 8

## 2018-01-03 MED ORDER — DEXTROSE 50 % IV SOLN
INTRAVENOUS | Status: AC
  Administered 2018-01-03: 4
  Filled 2018-01-03: qty 50

## 2018-01-03 NOTE — Discharge Instructions (Addendum)
Weigh yourself every day on the same scale at the same time.  Your goal weight is 248 pounds.  If you gain greater than 2 pounds take an additional 20 mg Lasix tablet each morning to equal 80 mg Lasix each morning, and continue to take 60 mg each evening.  When your weight drops back to 248 pounds resume 60 mg twice a day of Lasix.  If your weight continues to climb despite increasing your Lasix dosing call your physician for further directions.    Information on my medicine - ELIQUIS (apixaban)  Why was Eliquis prescribed for you? Eliquis was prescribed for you to reduce the risk of a blood clot forming that can cause a stroke if you have a medical condition called atrial fibrillation (a type of irregular heartbeat).  What do You need to know about Eliquis ? Take your Eliquis TWICE DAILY - one tablet in the morning and one tablet in the evening with or without food. If you have difficulty swallowing the tablet whole please discuss with your pharmacist how to take the medication safely.  Take Eliquis exactly as prescribed by your doctor and DO NOT stop taking Eliquis without talking to the doctor who prescribed the medication.  Stopping may increase your risk of developing a stroke.  Refill your prescription before you run out.  After discharge, you should have regular check-up appointments with your healthcare provider that is prescribing your Eliquis.  In the future your dose may need to be changed if your kidney function or weight changes by a significant amount or as you get older.  What do you do if you miss a dose? If you miss a dose, take it as soon as you remember on the same day and resume taking twice daily.  Do not take more than one dose of ELIQUIS at the same time to make up a missed dose.  Important Safety Information A possible side effect of Eliquis is bleeding. You should call your healthcare provider right away if you experience any of the following: ? Bleeding from an  injury or your nose that does not stop. ? Unusual colored urine (red or dark brown) or unusual colored stools (red or black). ? Unusual bruising for unknown reasons. ? A serious fall or if you hit your head (even if there is no bleeding).  Some medicines may interact with Eliquis and might increase your risk of bleeding or clotting while on Eliquis. To help avoid this, consult your healthcare provider or pharmacist prior to using any new prescription or non-prescription medications, including herbals, vitamins, non-steroidal anti-inflammatory drugs (NSAIDs) and supplements.  This website has more information on Eliquis (apixaban): http://www.eliquis.com/eliquis/home   Heart Failure Heart failure is a condition in which the heart has trouble pumping blood because it has become weak or stiff. This means that the heart does not pump blood efficiently for the body to work well. For some people with heart failure, fluid may back up into the lungs and there may be swelling (edema) in the lower legs. Heart failure is usually a long-term (chronic) condition. It is important for you to take good care of yourself and follow the treatment plan from your health care provider. What are the causes? This condition is caused by some health problems, including:  High blood pressure (hypertension). Hypertension causes the heart muscle to work harder than normal. High blood pressure eventually causes the heart to become stiff and weak.  Coronary artery disease (CAD). CAD is the buildup of  cholesterol and fat (plaques) in the arteries of the heart.  Heart attack (myocardial infarction). Injured tissue, which is caused by the heart attack, does not contract as well and the heart's ability to pump blood is weakened.  Abnormal heart valves. When the heart valves do not open and close properly, the heart muscle must pump harder to keep the blood flowing.  Heart muscle disease (cardiomyopathy or myocarditis). Heart  muscle disease is damage to the heart muscle from a variety of causes, such as drug or alcohol abuse, infections, or unknown causes. These can increase the risk of heart failure.  Lung disease. When the lungs do not work properly, the heart must work harder.  What increases the risk? Risk of heart failure increases as a person ages. This condition is also more likely to develop in people who:  Are overweight.  Are male.  Smoke or chew tobacco.  Abuse alcohol or illegal drugs.  Have taken medicines that can damage the heart, such as chemotherapy drugs.  Have diabetes. ? High blood sugar (glucose) is associated with high fat (lipid) levels in the blood. ? Diabetes can also damage tiny blood vessels that carry nutrients to the heart muscle.  Have abnormal heart rhythms.  Have thyroid problems.  Have low blood counts (anemia).  What are the signs or symptoms? Symptoms of this condition include:  Shortness of breath with activity, such as when climbing stairs.  Persistent cough.  Swelling of the feet, ankles, legs, or abdomen.  Unexplained weight gain.  Difficulty breathing when lying flat (orthopnea).  Waking from sleep because of the need to sit up and get more air.  Rapid heartbeat.  Fatigue and loss of energy.  Feeling light-headed, dizzy, or close to fainting.  Loss of appetite.  Nausea.  Increased urination during the night (nocturia).  Confusion.  How is this diagnosed? This condition is diagnosed based on:  Medical history, symptoms, and a physical exam.  Diagnostic tests, which may include: ? Echocardiogram. ? Electrocardiogram (ECG). ? Chest X-ray. ? Blood tests. ? Exercise stress test. ? Radionuclide scans. ? Cardiac catheterization and angiogram.  How is this treated? Treatment for this condition is aimed at managing the symptoms of heart failure. Medicines, behavioral changes, or other treatments may be necessary to treat heart  failure. Medicines These may include:  Angiotensin-converting enzyme (ACE) inhibitors. This type of medicine blocks the effects of a blood protein called angiotensin-converting enzyme. ACE inhibitors relax (dilate) the blood vessels and help to lower blood pressure.  Angiotensin receptor blockers (ARBs). This type of medicine blocks the actions of a blood protein called angiotensin. ARBs dilate the blood vessels and help to lower blood pressure.  Water pills (diuretics). Diuretics cause the kidneys to remove salt and water from the blood. The extra fluid is removed through urination, leaving a lower volume of blood that the heart has to pump.  Beta blockers. These improve heart muscle strength and they prevent the heart from beating too quickly.  Digoxin. This increases the force of the heartbeat.  Healthy behavior changes These may include:  Reaching and maintaining a healthy weight.  Stopping smoking or chewing tobacco.  Eating heart-healthy foods.  Limiting or avoiding alcohol.  Stopping use of street drugs (illegal drugs).  Physical activity.  Other treatments These may include:  Surgery to open blocked coronary arteries or repair damaged heart valves.  Placement of a biventricular pacemaker to improve heart muscle function (cardiac resynchronization therapy). This device paces both the right ventricle and  left ventricle.  Placement of a device to treat serious abnormal heart rhythms (implantable cardioverter defibrillator, or ICD).  Placement of a device to improve the pumping ability of the heart (left ventricular assist device, or LVAD).  Heart transplant. This can cure heart failure, and it is considered for certain patients who do not improve with other therapies.  Follow these instructions at home: Medicines  Take over-the-counter and prescription medicines only as told by your health care provider. Medicines are important in reducing the workload of your heart,  slowing the progression of heart failure, and improving your symptoms. ? Do not stop taking your medicine unless your health care provider told you to do that. ? Do not skip any dose of medicine. ? Refill your prescriptions before you run out of medicine. You need your medicines every day. Eating and drinking   Eat heart-healthy foods. Talk with a dietitian to make an eating plan that is right for you. ? Choose foods that contain no trans fat and are low in saturated fat and cholesterol. Healthy choices include fresh or frozen fruits and vegetables, fish, lean meats, legumes, fat-free or low-fat dairy products, and whole-grain or high-fiber foods. ? Limit salt (sodium) if directed by your health care provider. Sodium restriction may reduce symptoms of heart failure. Ask a dietitian to recommend heart-healthy seasonings. ? Use healthy cooking methods instead of frying. Healthy methods include roasting, grilling, broiling, baking, poaching, steaming, and stir-frying.  Limit your fluid intake if directed by your health care provider. Fluid restriction may reduce symptoms of heart failure. Lifestyle  Stop smoking or using chewing tobacco. Nicotine and tobacco can damage your heart and your blood vessels. Do not use nicotine gum or patches before talking to your health care provider.  Limit alcohol intake to no more than 1 drink per day for non-pregnant women and 2 drinks per day for men. One drink equals 12 oz of beer, 5 oz of wine, or 1 oz of hard liquor. ? Drinking more than that is harmful to your heart. Tell your health care provider if you drink alcohol several times a week. ? Talk with your health care provider about whether any level of alcohol use is safe for you. ? If your heart has already been damaged by alcohol or you have severe heart failure, drinking alcohol should be stopped completely.  Stop use of illegal drugs.  Lose weight if directed by your health care provider. Weight loss  may reduce symptoms of heart failure.  Do moderate physical activity if directed by your health care provider. People who are elderly and people with severe heart failure should consult with a health care provider for physical activity recommendations. Monitor important information  Weigh yourself every day. Keeping track of your weight daily helps you to notice excess fluid sooner. ? Weigh yourself every morning after you urinate and before you eat breakfast. ? Wear the same amount of clothing each time you weigh yourself. ? Record your daily weight. Provide your health care provider with your weight record.  Monitor and record your blood pressure as told by your health care provider.  Check your pulse as told by your health care provider. Dealing with extreme temperatures  If the weather is extremely hot: ? Avoid vigorous physical activity. ? Use air conditioning or fans or seek a cooler location. ? Avoid caffeine and alcohol. ? Wear loose-fitting, lightweight, and light-colored clothing.  If the weather is extremely cold: ? Avoid vigorous physical activity. ? Layer your  clothes. ? Wear mittens or gloves, a hat, and a scarf when you go outside. ? Avoid alcohol. General instructions  Manage other health conditions such as hypertension, diabetes, thyroid disease, or abnormal heart rhythms as told by your health care provider.  Learn to manage stress. If you need help to do this, ask your health care provider.  Plan rest periods when fatigued.  Get ongoing education and support as needed.  Participate in or seek rehabilitation as needed to maintain or improve independence and quality of life.  Stay up to date with immunizations. Keeping current on pneumococcal and influenza immunizations is especially important to prevent respiratory infections.  Keep all follow-up visits as told by your health care provider. This is important. Contact a health care provider if:  You have a  rapid weight gain.  You have increasing shortness of breath that is unusual for you.  You are unable to participate in your usual physical activities.  You tire easily.  You cough more than normal, especially with physical activity.  You have any swelling or more swelling in areas such as your hands, feet, ankles, or abdomen.  You are unable to sleep because it is hard to breathe.  You feel like your heart is beating quickly (palpitations).  You become dizzy or light-headed when you stand up. Get help right away if:  You have difficulty breathing.  You notice or your family notices a change in your awareness, such as having trouble staying awake or having difficulty with concentration.  You have pain or discomfort in your chest.  You have an episode of fainting (syncope). This information is not intended to replace advice given to you by your health care provider. Make sure you discuss any questions you have with your health care provider. Document Released: 12/10/2005 Document Revised: 08/14/2016 Document Reviewed: 07/04/2016 Elsevier Interactive Patient Education  [employer].

## 2018-01-03 NOTE — Progress Notes (Signed)
Patient's CBG 45.  [facility] texted.  Order for Dextrose 50% one amp x 1.  Dextrose 50% given, will recheck CBG.

## 2018-01-03 NOTE — Progress Notes (Signed)
Blood sugars have been high today from approximately 1300 til present.  [facility] called and made aware of elevated CBG's order received to discontinue D10 @ 117mls/hr.  Will continue to monitor patient blood sugars.

## 2018-01-03 NOTE — Progress Notes (Signed)
PROGRESS NOTE    Lawrence [name]  OIZ:[id number] DOB: 1946/08/20 DOA: 01/02/2018 PCP: [name], MD   Brief Narrative:  72 y.o. WM  retired Manufacturing systems engineer PMHx Anemia, COPD, Chronic ischemic heart disease S/P pacemaker, unspecified, OSA on CPAP, CAD, Diabetes mellitus type II, with peripheral neuropathy, Essential HTN, HLD, Nodule of kidney, Nontoxic multinodular goiter, Obesity, Other testicular hypofunction, PUD, CKD  Hospitalized in the [facility] and he ended up with afib which ended up not reversing.   He ended up with a pacemaker in 8/18.  This past week, his sugars started bottoming out (glucose was 28 at home and 38 in cardiology office) and he is off all diabetic medications.  He has been having difficulty with his breathing the last month but worse in the last 2 weeks.  +cough, productive of yellow or clear phlegm the last few days.  Temp to 99.6 yesterday.  HR is usually in 65-70 range, in the 120s for the last few days.  +LE edema.  He was given Lasix and that worked really well today with good diuresis.  Currently he doesn't think his breathing is all that bad right now.  Denies chest pain.   ED Course: Suspect acute hypoxic respiratory failure requiring non invasive mechanical ventilation BIPAP 2/2 to CAP complicated by acute on chronic heart failure. Wbc 14k, HR 118, RR 36. Meets SIRS criteria with possible pneumonia as source of infection.       Subjective: 1/11 A/O 4 negative CP, positive SOB, negative abdominal pain, negative N/V.   Assessment & Plan:   Principal Problem:   Acute on chronic diastolic CHF (congestive heart failure) (HCC) Active Problems:   Hyperlipidemia, mixed   Essential hypertension, benign   Paroxysmal atrial fibrillation (HCC)   Hypoglycemia  SIRS -Lactic acid normalized -Albumin  Acute Respiratory Failure with Hypoxia/CAP -Continue current antibiotic 7 days -DuoNeb QID -Solu-Medrol 60 mg BID -Flutter valve -Guaifenesin +  codeine PRN cough -Mucinex DM BID  -Pan cultures pending -PCXR: Consistent with pneumonia ? + interstitial edema. -Titrate O2 to maintain SPO2 89-93%  Paroxysmal atrial fibrillation? -See CHF  Acute on CKD stage III (baseline Cr~1.83) Recent Labs  Lab 01/02/18 1515 01/03/18 0943  CREATININE 2.16* 2.20*    Acute/chronic ischemic CHF (base weight~108 kg/240 pound)/slightly Hypotensive  -Strict in and out -Daily weight Filed Weights   01/02/18 1510 01/02/18 2030 01/03/18 0400  Weight: 267 lb (121.1 kg) 269 lb 13.5 oz (122.4 kg) 269 lb 6.4 oz (122.2 kg)  -Amiodarone 200 mg daily -(Hold) Lasix 80 mg BID  -(Hold) Lisinopril may be contributing to patient's acute cough/hypotension. -Nebivolol 5 mg daily -Albumin 50 g -Once patient stabilized will attempt to diurese  Essential HTN -See CHF   Diabetes type 2 controlled with complication -Patient having episodes of SEVERE HYPOGLYCEMIA. Prior to admission patient's PCP discontinued his Cogentin, Amaryl, Lantus secondary to hypoglycemia. Per patient not started ~ one week ago -Hold all DM medication -1/11 Hemoglobin A1c = 5.6  Hypoglycemia -See diabetes -Hypoglycemia secondary to CHF? Insulinoma? -Continue D10 181m/hr   HLD -Continue Zocor    DVT prophylaxis: Eliquis Code Status: Full Family Communication: None Disposition Plan: TBD   Consultants:  None    Procedures/Significant Events:  1/11 Echocardiogram:- Left ventricle: Poor image quality with foreshortened views even   with definity precludes accurate assessment of EF and wall   motion. Recommend cardiac MRI for further assessment. The cavity   size was normal. There was moderate concentric hypertrophy.  The   study was not technically sufficient to allow evaluation of LV   diastolic dysfunction due to atrial fibrillation. - Aortic valve: Poorly visualized. A bicuspid morphology cannot be   excluded; moderately thickened, moderately calcified leaflets. -  Aorta: Aortic root dimension: 39 mm (ED). - Aortic root: The aortic root was mildly dilated. - Mitral valve: Calcified annulus. There was trivial regurgitation. - Left atrium: The atrium was mildly dilated. - Right ventricle: Poorly visualized. - Tricuspid valve: There was mild-moderate regurgitation. - Pulmonic valve: There was trivial regurgitation. 1/11 PCXR:Bilateral interstitial edema superimposed on chronic interstitial lung disease, indicating active mild CHF. 2. Stable mild cardiomegaly.  Pacemaker in place. 3. Aortic atherosclerosis    I have personally reviewed and interpreted all radiology studies and my findings are as above.  VENTILATOR SETTINGS:    Cultures 1/10 blood pending 1/10 urine pending 1/11 rest for virus panel pending      Antimicrobials: Anti-infectives (From admission, onward)   Start     Stop   01/03/18 1630  vancomycin (VANCOCIN) 1,500 mg in sodium chloride 0.9 % 500 mL IVPB  Status:  Discontinued     01/03/18 0159   01/03/18 0000  piperacillin-tazobactam (ZOSYN) IVPB 3.375 g  Status:  Discontinued     01/03/18 0159   01/02/18 1615  piperacillin-tazobactam (ZOSYN) IVPB 3.375 g     01/02/18 1645   01/02/18 1615  vancomycin (VANCOCIN) IVPB 1000 mg/200 mL premix  Status:  Discontinued     01/02/18 1608   01/02/18 1615  vancomycin (VANCOCIN) IVPB 1000 mg/200 mL premix  Status:  Discontinued     01/03/18 0159   01/02/18 1615  vancomycin (VANCOCIN) IVPB 1000 mg/200 mL premix     01/02/18 1718       Devices    LINES / TUBES:      Continuous Infusions: . dextrose 75 mL/hr at 01/03/18 0456     Objective: Vitals:   01/03/18 0500 01/03/18 0600 01/03/18 0700 01/03/18 0750  BP: 101/69 103/61 109/72 110/76  Pulse: (!) 115 (!) 59 (!) 114 (!) 118  Resp: 18 17 18 20   Temp:      TempSrc:      SpO2: 98% 99% 100% 100%  Weight:      Height:        Intake/Output Summary (Last 24 hours) at 01/03/2018 0759 Last data filed at 01/03/2018  0556 Gross per 24 hour  Intake 875 ml  Output 425 ml  Net 450 ml   Filed Weights   01/02/18 1510 01/02/18 2030 01/03/18 0400  Weight: 267 lb (121.1 kg) 269 lb 13.5 oz (122.4 kg) 269 lb 6.4 oz (122.2 kg)    Examination:  General: A/O 4, positive acute respiratory distress Neck:  Negative scars, masses, torticollis, lymphadenopathy, JVD Lungs: diffuse decreased breath sounds, positive mild expiratory wheezing, negative crackles  Cardiovascular: Sinus tachycardia, (paced) Regular  rhythm without murmur gallop or rub normal S1 and S2 Abdomen: Morbidly obese, negative abdominal pain, nondistended, positive soft, bowel sounds, no rebound, no ascites, no appreciable mass Extremities: positive bilateral lower extremity edema 2+  To thigh  Skin: Negative rashes, lesions, ulcers Psychiatric:  Negative depression, negative anxiety, negative fatigue, negative mania  Central nervous system:  Cranial nerves II through XII intact, tongue/uvula midline, all extremities muscle strength 5/5, sensation intact throughout, negative dysarthria, negative expressive aphasia, negative receptive aphasia.  .     Data Reviewed: Care during the described time interval was provided by me .  I have  reviewed this patient's available data, including medical history, events of note, physical examination, and all test results as part of my evaluation.   CBC: Recent Labs  Lab 01/02/18 1515  WBC 14.2*  HGB 13.2  HCT 40.3  MCV 93.5  PLT 196   Basic Metabolic Panel: Recent Labs  Lab 01/02/18 1515  NA 135  K 4.2  CL 95*  CO2 28  GLUCOSE 51*  BUN 35*  CREATININE 2.16*  CALCIUM 8.8*   GFR: Estimated Creatinine Clearance: 41.1 mL/min (A) (by C-G formula based on SCr of 2.16 mg/dL (H)). Liver Function Tests: Recent Labs  Lab 01/02/18 1515  AST 51*  ALT 29  ALKPHOS 170*  BILITOT 1.1  PROT 7.9  ALBUMIN 3.8   Recent Labs  Lab 01/02/18 1515  LIPASE 45   No results for input(s): AMMONIA in the  last 168 hours. Coagulation Profile: Recent Labs  Lab 01/02/18 1515  INR 2.11   Cardiac Enzymes: Recent Labs  Lab 01/02/18 1515 01/03/18 0208  TROPONINI 0.03* 0.04*   BNP (last 3 results) Recent Labs    09/17/17 1627 11/04/17 1112  PROBNP 6,365* 6,012*   HbA1C: No results for input(s): HGBA1C in the last 72 hours. CBG: Recent Labs  Lab 01/03/18 0015 01/03/18 0354 01/03/18 0356 01/03/18 0452 01/03/18 0751  GLUCAP 104* 41* 45* 93 44*   Lipid Profile: No results for input(s): CHOL, HDL, LDLCALC, TRIG, CHOLHDL, LDLDIRECT in the last 72 hours. Thyroid Function Tests: Recent Labs    01/03/18 0208  TSH 6.384*   Anemia Panel: No results for input(s): VITAMINB12, FOLATE, FERRITIN, TIBC, IRON, RETICCTPCT in the last 72 hours. Urine analysis:    Component Value Date/Time   COLORURINE YELLOW 01/02/2018 1620   APPEARANCEUR CLEAR 01/02/2018 1620   LABSPEC 1.025 01/02/2018 1620   PHURINE 5.5 01/02/2018 1620   GLUCOSEU NEGATIVE 01/02/2018 1620   HGBUR NEGATIVE 01/02/2018 1620   BILIRUBINUR NEGATIVE 01/02/2018 1620   KETONESUR NEGATIVE 01/02/2018 1620   PROTEINUR NEGATIVE 01/02/2018 1620   UROBILINOGEN 1.0 01/21/2008 1430   NITRITE NEGATIVE 01/02/2018 1620   LEUKOCYTESUR NEGATIVE 01/02/2018 1620   Sepsis Labs: @LABRCNTIP (procalcitonin:4,lacticidven:4)  )No results found for this or any previous visit (from the past 240 hour(s)).       Radiology Studies: Dg Chest Portable 1 View  Result Date: 01/02/2018 CLINICAL DATA:  Sent from Dr Dorisann Frames office with SOB and CHF. His blood sugar was 36 in the office. He is cyanotic and dyspneic, hx of diabetes, coronary artery disease, HTN, hyperlipidemia, renal insufficiency EXAM: PORTABLE CHEST 1 VIEW COMPARISON:  Chest x-rays dated 12/04/2017 and 01/30/2017. FINDINGS: Mild cardiomegaly is stable. Left chest wall pacemaker hardware appears stable in positioning. Aortic atherosclerosis. Bilateral interstitial prominence,  slightly increased compared to the previous studies indicating CHF superimposed on chronic interstitial lung disease. No confluent opacity to suggest a developing pneumonia. No pleural effusion or pneumothorax seen. Median sternotomy wires appear intact and stable in alignment. No acute or suspicious osseous finding. IMPRESSION: 1. Bilateral interstitial edema superimposed on chronic interstitial lung disease, indicating active mild CHF. 2. Stable mild cardiomegaly.  Pacemaker in place. 3. Aortic atherosclerosis. Electronically Signed   By: Franki Cabot M.D.   On: 01/02/2018 15:43        Scheduled Meds: . chlorhexidine  15 mL Mouth Rinse BID  . furosemide  80 mg Intravenous BID  . ipratropium-albuterol  3 mL Nebulization Q6H  . mouth rinse  15 mL Mouth Rinse q12n4p   Continuous Infusions: .  dextrose 75 mL/hr at 01/03/18 0456     LOS: 1 day    Time spent: 40 minutes    WOODS, Geraldo Docker, MD [facility] Pager [phone]   If 7PM-7AM, please contact night-coverage [email or web address] Password [facility] 01/03/2018, 7:59 AM

## 2018-01-03 NOTE — Progress Notes (Signed)
Patient tolerated dinner eating 50% of his meal without difficulty. Patient breathing much better stating he is feeling much better also.

## 2018-01-03 NOTE — Progress Notes (Signed)
  Echocardiogram 2D Echocardiogram has been performed.  Lawrence [name] G [name] 01/03/2018, 11:25 AM

## 2018-01-03 NOTE — Progress Notes (Signed)
CRITICAL VALUE ALERT  Critical Value:  0.004 Troponin  Date & Time Notied:  8338 01/03/2018  Provider Notified: [facility]  Orders Received/Actions taken: Pending

## 2018-01-03 NOTE — Progress Notes (Signed)
Midnight CBG 44 [facility] oncall notified of low blood sugar.  Order for one amp 50 mls of D50 given IVP x 1 via right AC PIV.  Blood sugar rechecked twenty minutes later and CBG was 104.  Temperature checked rectally 102.6.  [facility] notified and orders received for both po tylenol and tylenol supp.  Tylenol supp given.  Will recheck temperature at 0130.

## 2018-01-03 NOTE — Progress Notes (Signed)
Texted sent to Dr Sherral Hammers CBG 44 again this am.

## 2018-01-03 NOTE — Care Management Note (Addendum)
Case Management Note  Patient Details  Name: Lawrence [name] MRN: [id number] Date of Birth: 20-Nov-1946  Subjective/Objective:    From home with wife, pta indep with cane,Suspect acute hypoxic respiratory failure requiring non invasive mechanical ventilation BIPAP 2/2 to CAP complicated by acute on chronic heart failure. Wbc 14k, HR 118, RR 36. Meets SIRS criteria with possible pneumonia . NCM left [facility] agency list with wife to decide if they would want CHF disease management.  1/14 Graves, BSN- NCM spoke with patient and wife, Wife states she has not had time to look at the agency list, she will let me know who she would like to work with when she looks at the list.  PT /OT eval rec HHPT, [place] and 3 n 1,  Will need HHRN for CHF disease Management also.  NCM awaiting for wife decision. Also will see if [facility] candidate.  1/17 Arabi, BSN - Hypoxic resp failue secondary to pna, complicated by chf, acute/CKD  conts with fluid over load, conts on iv lasix, iv abx, plan home with [facility] .  Discussed in LOS.  Will need to offer choice again to wife for [facility] services for [facility] for CHF disease Management and HHPT, [place].  And 3 n 1. ALso referred to [facility].                   Action/Plan: NCM will follow for dc needs.  Expected Discharge Date:  01/06/18               Expected Discharge Plan:     In-House Referral:     Discharge planning Services  CM Consult  Post Acute Care Choice:    Choice offered to:     DME Arranged:    DME Agency:     HH Arranged:    HH Agency:     Status of Service:  In process, will continue to follow  If discussed at Long Length of Stay Meetings, dates discussed:    Additional Comments:  Zenon Mayo, RN 01/03/2018, 5:00 PM

## 2018-01-03 NOTE — Progress Notes (Signed)
Patient alert and oriented, appropriate with conversation.  Edema in lower extremities resolving.  Left lower extremity trace edema non pitting, right lower extremity +1, cap refill brisk.  Oxygen on 3 LPM via nasal canula.  No signs or symptoms of respiratory distress.  Safety and comfort measure maintained,  Will continue to monitor patient's progress.  Call bell remains within reach.

## 2018-01-04 DIAGNOSIS — B342 Coronavirus infection, unspecified: Secondary | ICD-10-CM

## 2018-01-04 DIAGNOSIS — N179 Acute kidney failure, unspecified: Secondary | ICD-10-CM

## 2018-01-04 DIAGNOSIS — N183 Chronic kidney disease, stage 3 (moderate): Secondary | ICD-10-CM

## 2018-01-04 DIAGNOSIS — I5032 Chronic diastolic (congestive) heart failure: Secondary | ICD-10-CM

## 2018-01-04 DIAGNOSIS — J9601 Acute respiratory failure with hypoxia: Secondary | ICD-10-CM

## 2018-01-04 LAB — GLUCOSE, CAPILLARY
GLUCOSE-CAPILLARY: 399 mg/dL — AB (ref 65–99)
GLUCOSE-CAPILLARY: 399 mg/dL — AB (ref 65–99)
GLUCOSE-CAPILLARY: 434 mg/dL — AB (ref 65–99)
GLUCOSE-CAPILLARY: 371 mg/dL — AB (ref 65–99)
Glucose-Capillary: 505 mg/dL (ref 65–99)
Glucose-Capillary: 468 mg/dL — ABNORMAL HIGH (ref 65–99)
Glucose-Capillary: 444 mg/dL — ABNORMAL HIGH (ref 65–99)
Glucose-Capillary: 302 mg/dL — ABNORMAL HIGH (ref 65–99)
Glucose-Capillary: 231 mg/dL — ABNORMAL HIGH (ref 65–99)
Glucose-Capillary: 130 mg/dL — ABNORMAL HIGH (ref 65–99)

## 2018-01-04 LAB — BASIC METABOLIC PANEL
Anion gap: 16 — ABNORMAL HIGH (ref 5–15)
Anion gap: 15 (ref 5–15)
BUN: 44 mg/dL — AB (ref 6–20)
BUN: 49 mg/dL — AB (ref 6–20)
CHLORIDE: 90 mmol/L — AB (ref 101–111)
CO2: 23 mmol/L (ref 22–32)
CO2: 20 mmol/L — ABNORMAL LOW (ref 22–32)
CREATININE: 2.49 mg/dL — AB (ref 0.61–1.24)
Calcium: 8.5 mg/dL — ABNORMAL LOW (ref 8.9–10.3)
Calcium: 8.4 mg/dL — ABNORMAL LOW (ref 8.9–10.3)
Chloride: 90 mmol/L — ABNORMAL LOW (ref 101–111)
Creatinine, Ser: 2.28 mg/dL — ABNORMAL HIGH (ref 0.61–1.24)
GFR calc Af Amer: 28 mL/min — ABNORMAL LOW (ref >60)
GFR, EST AFRICAN AMERICAN: 31 mL/min — AB (ref >60)
GFR, EST NON AFRICAN AMERICAN: 24 mL/min — AB (ref >60)
GFR, EST NON AFRICAN AMERICAN: 27 mL/min — AB (ref >60)
Glucose, Bld: 484 mg/dL — ABNORMAL HIGH (ref 65–99)
Glucose, Bld: 499 mg/dL — ABNORMAL HIGH (ref 65–99)
POTASSIUM: 4.6 mmol/L (ref 3.5–5.1)
POTASSIUM: 4.4 mmol/L (ref 3.5–5.1)
SODIUM: 129 mmol/L — AB (ref 135–145)
SODIUM: 125 mmol/L — AB (ref 135–145)

## 2018-01-04 LAB — CBC
HCT: 37.9 % — ABNORMAL LOW (ref 39.0–52.0)
Hemoglobin: 12 g/dL — ABNORMAL LOW (ref 13.0–17.0)
MCH: 29.9 pg (ref 26.0–34.0)
MCHC: 31.7 g/dL (ref 30.0–36.0)
MCV: 94.5 fL (ref 78.0–100.0)
PLATELETS: 142 10*3/uL — AB (ref 150–400)
RBC: 4.01 MIL/uL — ABNORMAL LOW (ref 4.22–5.81)
RDW: 16.5 % — AB (ref 11.5–15.5)
WBC: 9.8 10*3/uL (ref 4.0–10.5)

## 2018-01-04 LAB — MAGNESIUM
MAGNESIUM: 2.1 mg/dL (ref 1.7–2.4)
MAGNESIUM: 2 mg/dL (ref 1.7–2.4)

## 2018-01-04 LAB — URINE CULTURE: Culture: <10000 — AB

## 2018-01-04 LAB — LACTIC ACID, PLASMA
LACTIC ACID, VENOUS: 4 mmol/L — AB (ref 0.5–1.9)
Lactic Acid, Venous: 3.6 mmol/L (ref 0.5–1.9)
Lactic Acid, Venous: 2.7 mmol/L (ref 0.5–1.9)

## 2018-01-04 LAB — HIV ANTIBODY (ROUTINE TESTING W REFLEX): HIV SCREEN 4TH GENERATION: NONREACTIVE

## 2018-01-04 MED ORDER — INSULIN GLARGINE 100 UNIT/ML ~~LOC~~ SOLN
10.0000 [IU] | Freq: Once | SUBCUTANEOUS | Status: AC
  Administered 2018-01-04: 10 [IU] via SUBCUTANEOUS
  Filled 2018-01-04: qty 0.1

## 2018-01-04 MED ORDER — SODIUM CHLORIDE 0.9 % IV SOLN
INTRAVENOUS | Status: DC
  Administered 2018-01-04 – 2018-01-05 (×3): via INTRAVENOUS

## 2018-01-04 MED ORDER — BLISTEX MEDICATED EX OINT
TOPICAL_OINTMENT | CUTANEOUS | Status: DC | PRN
  Filled 2018-01-04: qty 6.3

## 2018-01-04 MED ORDER — LIP MEDEX EX OINT
1.0000 "application " | TOPICAL_OINTMENT | CUTANEOUS | Status: DC | PRN
  Filled 2018-01-04: qty 7

## 2018-01-04 MED ORDER — IPRATROPIUM-ALBUTEROL 0.5-2.5 (3) MG/3ML IN SOLN
3.0000 mL | Freq: Three times a day (TID) | RESPIRATORY_TRACT | Status: DC
  Administered 2018-01-05 – 2018-01-13 (×26): 3 mL via RESPIRATORY_TRACT
  Filled 2018-01-04 (×26): qty 3

## 2018-01-04 MED ORDER — INSULIN ASPART 100 UNIT/ML ~~LOC~~ SOLN
30.0000 [IU] | Freq: Once | SUBCUTANEOUS | Status: AC
  Administered 2018-01-04: 30 [IU] via SUBCUTANEOUS

## 2018-01-04 MED ORDER — SODIUM CHLORIDE 0.9 % IV SOLN
INTRAVENOUS | Status: DC
  Administered 2018-01-04: 3.8 [IU]/h via INTRAVENOUS
  Filled 2018-01-04: qty 1

## 2018-01-04 MED ORDER — SODIUM CHLORIDE 0.9 % IV SOLN
INTRAVENOUS | Status: DC
  Administered 2018-01-04: 09:00:00 via INTRAVENOUS

## 2018-01-04 MED ORDER — ALUM & MAG HYDROXIDE-SIMETH 200-200-20 MG/5ML PO SUSP: 30.0000 mL | ORAL | Status: DC | PRN

## 2018-01-04 MED ORDER — SALINE SPRAY 0.65 % NA SOLN
1.0000 | NASAL | Status: DC | PRN
  Filled 2018-01-04: qty 44

## 2018-01-04 MED ORDER — POLYVINYL ALCOHOL 1.4 % OP SOLN
2.0000 [drp] | OPHTHALMIC | Status: DC | PRN
  Filled 2018-01-04: qty 15

## 2018-01-04 MED ORDER — DEXTROSE 50 % IV SOLN: 25.0000 mL | INTRAVENOUS | Status: DC | PRN

## 2018-01-04 MED ORDER — SODIUM CHLORIDE 0.9 % IV BOLUS (SEPSIS)
500.0000 mL | Freq: Once | INTRAVENOUS | Status: AC
  Administered 2018-01-04: 167 mL via INTRAVENOUS

## 2018-01-04 MED ORDER — HYDROCORTISONE 2.5 % RE CREA
1.0000 "application " | TOPICAL_CREAM | Freq: Four times a day (QID) | RECTAL | Status: DC | PRN
  Filled 2018-01-04: qty 28.35

## 2018-01-04 MED ORDER — PHENOL 1.4 % MT LIQD
1.0000 | OROMUCOSAL | Status: DC | PRN
  Administered 2018-01-04: 1 via OROMUCOSAL
  Filled 2018-01-04: qty 177

## 2018-01-04 MED ORDER — METHYLPREDNISOLONE SODIUM SUCC 125 MG IJ SOLR
60.0000 mg | Freq: Every day | INTRAMUSCULAR | Status: DC
  Administered 2018-01-05 – 2018-01-06 (×2): 60 mg via INTRAVENOUS
  Filled 2018-01-04 (×2): qty 2

## 2018-01-04 MED ORDER — INSULIN REGULAR BOLUS VIA INFUSION
0.0000 [IU] | Freq: Three times a day (TID) | INTRAVENOUS | Status: DC
  Filled 2018-01-04: qty 10

## 2018-01-04 MED ORDER — DEXTROSE-NACL 5-0.45 % IV SOLN
INTRAVENOUS | Status: DC
  Administered 2018-01-04: 20:00:00 via INTRAVENOUS

## 2018-01-04 MED ORDER — INSULIN ASPART 100 UNIT/ML ~~LOC~~ SOLN
0.0000 [IU] | SUBCUTANEOUS | Status: DC
  Administered 2018-01-04: 20 [IU] via SUBCUTANEOUS

## 2018-01-04 MED ORDER — INSULIN ASPART 100 UNIT/ML ~~LOC~~ SOLN: 0.0000 [IU] | SUBCUTANEOUS | Status: DC

## 2018-01-04 MED ORDER — MUSCLE RUB 10-15 % EX CREA
1.0000 "application " | TOPICAL_CREAM | CUTANEOUS | Status: DC | PRN
  Filled 2018-01-04: qty 85

## 2018-01-04 MED ORDER — INSULIN GLARGINE 100 UNIT/ML ~~LOC~~ SOLN
5.0000 [IU] | Freq: Once | SUBCUTANEOUS | Status: AC
  Administered 2018-01-04: 5 [IU] via SUBCUTANEOUS
  Filled 2018-01-04: qty 0.05

## 2018-01-04 MED ORDER — METHYLPREDNISOLONE SODIUM SUCC 125 MG IJ SOLR: 80.0000 mg | Freq: Every day | INTRAMUSCULAR | Status: DC

## 2018-01-04 NOTE — Progress Notes (Signed)
CRITICAL VALUE ALERT  Critical Value:  Lactic acid 2.7  Date & Time Notied:  01/04/18 1427  Provider Notified: Dr. Sherral Hammers  Orders Received/Actions taken: Administer IV ABX

## 2018-01-04 NOTE — Progress Notes (Addendum)
CRITICAL VALUE ALERT  Critical Value:  Lactic acid 3.7  Date & Time Notied:  01/04/18 1017  Provider Notified: Sherral Hammers  Orders Received/Actions taken: waiting response

## 2018-01-04 NOTE — Evaluation (Signed)
Physical Therapy Evaluation Patient Details Name: Lawrence [name] MRN: [id number] DOB: 06-03-1946 Today's Date: 01/04/2018   History of Present Illness  72 y.o.WM retired Manufacturing systems engineer PMHx Anemia, COPD, Chronic ischemic heart disease S/P pacemaker, unspecified, OSA on CPAP, CAD, Diabetes mellitus type II, with peripheral neuropathy, Essential HTN, HLD, Nodule of kidney, Nontoxic multinodular goiter, Obesity, Other testicular hypofunction, PUD, CKD.This past week, his sugars started bottoming out (glucose was 28 at home and 38 in cardiology office) and he is off all diabetic medications.  He has been having difficulty with his breathing the last month but worse in the last 2 weeks.  +cough, productive of yellow or clear phlegm the last few days.  Temp to 99.6 yesterday.  HR is usually in 65-70 range, in the 120s for the last few days.  +LE edema.  He was given Lasix and that worked really well today with good diuresis.   Clinical Impression  Pt admitted with above diagnosis. Pt currently with functional limitations due to the deficits listed below (see PT Problem List).  Pt was able to take a few steps to the chair with single UE support.  Pt desat to 87% on 2.5LO2.  DOE 3/4.  Did not feel like he could progress ambulation yet.  Slightly dizzy as well with slight drop in BP.  112/68 initially, 96/61 once transition to chair.  115/79 at end of treatment.  Hr stable at 114 bpm.  Pt will benefit from skilled PT to increase their independence and safety with mobility to allow discharge to the venue listed below.      Follow Up Recommendations Home health PT;Supervision/Assistance - 24 hour    Equipment Recommendations  Other (comment)(TBA)    Recommendations for Other Services       Precautions / Restrictions Precautions Precautions: Fall Precaution Comments: DROPLET Restrictions Weight Bearing Restrictions: No      Mobility  Bed Mobility Overal bed mobility: Independent              General bed mobility comments: incr time but able to complete with out help  Transfers Overall transfer level: Needs assistance Equipment used: 1 person hand held assist Transfers: Sit to/from Stand Sit to Stand: Min assist         General transfer comment: Pt able to come to stand but needed steadying min assist for balance.   Took pivotal steps bed to recliner with steadying assist as he went.  needed assist to control descent into chair.   Ambulation/Gait                Stairs            Wheelchair Mobility    Modified Rankin (Stroke Patients Only)       Balance Overall balance assessment: Needs assistance Sitting-balance support: No upper extremity supported;Feet supported Sitting balance-Leahy Scale: Fair     Standing balance support: Single extremity supported;During functional activity Standing balance-Leahy Scale: Poor Standing balance comment: relies on 1 person HHA and furniture to steady self.                              Pertinent Vitals/Pain Pain Assessment: No/denies pain    Home Living Family/patient expects to be discharged to:: Private residence Living Arrangements: Spouse/significant other Available Help at Discharge: Family;Available 24 hours/day Type of Home: House Home Access: Stairs to enter Entrance Stairs-Rails: Right;Left;Can reach both Entrance Stairs-Number of Steps: 3 Home Layout:  Multi-level Home Equipment: Shower seat;Walker - 4 wheels Additional Comments: Pt reports he used rollator when they went out, no device in house.  Of note, mother in law also stays with pt and wife and wife is caregiver for both.  She states that she gets one well and the other is sick.      Prior Function Level of Independence: Independent with assistive device(s)         Comments: used rollator in community.  did all B/D on his own.      Hand Dominance        Extremity/Trunk Assessment   Upper Extremity  Assessment Upper Extremity Assessment: Defer to OT evaluation    Lower Extremity Assessment Lower Extremity Assessment: Generalized weakness    Cervical / Trunk Assessment Cervical / Trunk Assessment: Normal  Communication   Communication: No difficulties  Cognition Arousal/Alertness: Awake/alert Behavior During Therapy: WFL for tasks assessed/performed Overall Cognitive Status: Within Functional Limits for tasks assessed                                        General Comments      Exercises General Exercises - Lower Extremity Ankle Circles/Pumps: AROM;Both;10 reps;Seated Long Arc Quad: AROM;Both;10 reps;Seated   Assessment/Plan    PT Assessment Patient needs continued PT services  PT Problem List Decreased activity tolerance;Decreased balance;Decreased strength;Decreased mobility;Decreased knowledge of use of DME;Decreased safety awareness;Decreased knowledge of precautions;Cardiopulmonary status limiting activity;Obesity       PT Treatment Interventions DME instruction;Gait training;Functional mobility training;Therapeutic activities;Therapeutic exercise;Balance training;Stair training;Patient/family education    PT Goals (Current goals can be found in the Care Plan section)  Acute Rehab PT Goals Patient Stated Goal: to get better PT Goal Formulation: With patient Time For Goal Achievement: 01/18/18 Potential to Achieve Goals: Good    Frequency Min 3X/week   Barriers to discharge        Co-evaluation               AM-PAC PT "6 Clicks" Daily Activity  Outcome Measure Difficulty turning over in bed (including adjusting bedclothes, sheets and blankets)?: None Difficulty moving from lying on back to sitting on the side of the bed? : None Difficulty sitting down on and standing up from a chair with arms (e.g., wheelchair, bedside commode, etc,.)?: A Little Help needed moving to and from a bed to chair (including a wheelchair)?: A Little Help  needed walking in hospital room?: Total Help needed climbing 3-5 steps with a railing? : Total 6 Click Score: 16    End of Session Equipment Utilized During Treatment: Gait belt;Oxygen Activity Tolerance: Patient limited by fatigue Patient left: in chair;with call bell/phone within reach;with chair alarm set;with family/visitor present Nurse Communication: Mobility status PT Visit Diagnosis: Unsteadiness on feet (R26.81);Muscle weakness (generalized) (M62.81)    Time: 1607-3710 PT Time Calculation (min) (ACUTE ONLY): 26 min   Charges:   PT Evaluation $PT Eval Moderate Complexity: 1 Mod PT Treatments $Therapeutic Activity: 8-22 mins   PT G Codes:        Faviola Klare,PT Acute Rehabilitation [phone] (pager)   Denice Paradise 01/04/2018, 1:06 PM

## 2018-01-04 NOTE — Progress Notes (Signed)
CRITICAL VALUE ALERT  Critical Value:  Lactic acid 4.0  Date & Time Notied:  01/04/18 1841  Provider Notified: Dr. Sherral Hammers  Orders Received/Actions taken: waiting for response; see orders

## 2018-01-04 NOTE — Progress Notes (Signed)
PROGRESS NOTE    Lawrence [name]  WKG:[id number] DOB: [birth year] DOA: 01/02/2018 PCP: [name], MD   Brief Narrative:  72 y.o. WM  retired Manufacturing systems engineer PMHx Anemia, COPD, Chronic ischemic heart disease S/P pacemaker, unspecified, OSA on CPAP, CAD, Diabetes mellitus type II, with peripheral neuropathy, Essential HTN, HLD, Nodule of kidney, Nontoxic multinodular goiter, Obesity, Other testicular hypofunction, PUD, CKD  Hospitalized in the [place] and he ended up with afib which ended up not reversing.   He ended up with a pacemaker in 8/18.  This past week, his sugars started bottoming out (glucose was 28 at home and 38 in cardiology office) and he is off all diabetic medications.  He has been having difficulty with his breathing the last month but worse in the last 2 weeks.  +cough, productive of yellow or clear phlegm the last few days.  Temp to 99.6 yesterday.  HR is usually in 65-70 range, in the 120s for the last few days.  +LE edema.  He was given Lasix and that worked really well today with good diuresis.  Currently he doesn't think his breathing is all that bad right now.  Denies chest pain.   ED Course: Suspect acute hypoxic respiratory failure requiring non invasive mechanical ventilation BIPAP 2/2 to CAP complicated by acute on chronic heart failure. Wbc 14k, HR 118, RR 36. Meets SIRS criteria with possible pneumonia as source of infection.       Subjective: 1/12 A/O 4, negative CP, negative abdominal pain, negative N/V. Positive SOB (improved from 1/11)    Assessment & Plan:   Principal Problem:   Acute on chronic diastolic CHF (congestive heart failure) (HCC) Active Problems:   Hyperlipidemia, mixed   Essential hypertension, benign   Paroxysmal atrial fibrillation (HCC)   Hypoglycemia  SIRS -Lactic acid elevated. Continue to trend -Continue normal saline. Watch carefully for fluid overload -Continue current antibiotics  Acute Respiratory Failure  with Hypoxia/ viral CAP (positive coronavirus)  -Continue current antibiotic 7 days -DuoNeb QID -Solu-Medrol 60 mg daily -Flutter valve -Guaifenesin + codeine PRN cough -Mucinex DM BID  -PCXR: Consistent with pneumonia ? + interstitial edema. -Titrate O2 to maintain SPO2 89-93%  Paroxysmal atrial fibrillation? -See CHF  Acute on CKD stage III (baseline Cr~1.83) Recent Labs  Lab 01/02/18 1515 01/03/18 0943 01/04/18 0648  CREATININE 2.16* 2.20* 2.49*   Acute/chronic diastolic CHF (base YVOPFY~924 kg/240 pound)/slightly Hypotensive  -Strict in and out since admission +1.6 L -Daily weight Filed Weights   01/02/18 2030 01/03/18 0400 01/04/18 0406  Weight: 269 lb 13.5 oz (122.4 kg) 269 lb 6.4 oz (122.2 kg) 276 lb 0.3 oz (125.2 kg)  -Amiodarone 200 mg daily -(Hold) Lasix 80 mg BID  -(Hold) Lisinopril may be contributing to patient's acute cough/hypotension. -Nebivolol 5 mg daily -Albumin 50 g -Once patient stabilized will attempt to diurese  Essential HTN -See CHF   Diabetes type 2 controlled with complication/Hyperglycemia -Patient having episodes of SEVERE HYPOGLYCEMIA. Prior to admission patient's PCP discontinued his Cogentin, Amaryl, Lantus secondary to hypoglycemia. Per patient not started ~ one week ago -1/11 Hemoglobin A1c = 5.6 -1/12 start insulin drip. Patient hyperglycemic secondary to steroids. Have also decreased steroid dose as we do not want patient to go into DKA  Hypoglycemia/ -See diabetes -Hypoglycemia secondary to CHF? Insulinoma? -1/12 discontinue D10  -Normal saline 100 ml/hr per glucose stabilizer protocol     HLD -Continue Zocor    DVT prophylaxis: Eliquis Code Status: Full Family Communication:  Wife at bedside for discussion of plan of care Disposition Plan: TBD   Consultants:  None    Procedures/Significant Events:  1/11 Echocardiogram:- Left ventricle: Poor image quality with foreshortened views even   with definity precludes  accurate assessment of EF and wall   motion. Recommend cardiac MRI for further assessment. -moderate concentric hypertrophy. The   study was not technically sufficient to allow evaluation of LV   diastolic dysfunction due to atrial fibrillation. - Aortic valve: Poorly visualized. A bicuspid morphology cannot be   excluded; moderately thickened, moderately calcified leaflets. -- Tricuspid valve: There was mild-moderate regurgitation. 1/11 PCXR:-Bilateral interstitial edema superimposed on chronic interstitial lung disease, indicating active mild CHF. -Stable mild cardiomegaly.  -Pacemaker in place.     I have personally reviewed and interpreted all radiology studies and my findings are as above.  VENTILATOR SETTINGS:    Cultures 1/10 blood NGTD 1/10 urine insignificant growth 1/11 blood NGTD 1/11 respiratory virus panel positive coronavirus      Antimicrobials: Anti-infectives (From admission, onward)   Start     Stop   01/03/18 1630  vancomycin (VANCOCIN) 1,500 mg in sodium chloride 0.9 % 500 mL IVPB  Status:  Discontinued     01/03/18 0159   01/03/18 1400  cefTRIAXone (ROCEPHIN) 1 g in dextrose 5 % 50 mL IVPB     01/10/18 1359   01/03/18 1400  azithromycin (ZITHROMAX) 500 mg in dextrose 5 % 250 mL IVPB     01/10/18 1359   01/03/18 0000  piperacillin-tazobactam (ZOSYN) IVPB 3.375 g  Status:  Discontinued     01/03/18 0159   01/02/18 1615  piperacillin-tazobactam (ZOSYN) IVPB 3.375 g     01/02/18 1645   01/02/18 1615  vancomycin (VANCOCIN) IVPB 1000 mg/200 mL premix  Status:  Discontinued     01/02/18 1608   01/02/18 1615  vancomycin (VANCOCIN) IVPB 1000 mg/200 mL premix  Status:  Discontinued     01/03/18 0159   01/02/18 1615  vancomycin (VANCOCIN) IVPB 1000 mg/200 mL premix     01/02/18 1718       Devices    LINES / TUBES:      Continuous Infusions: . sodium chloride    . azithromycin Stopped (01/03/18 1722)  . cefTRIAXone (ROCEPHIN)  IV Stopped  (01/03/18 1652)     Objective: Vitals:   01/04/18 0300 01/04/18 0400 01/04/18 0406 01/04/18 0739  BP:  (!) 115/91    Pulse: (!) 120     Resp: (!) 21     Temp:  (!) 97.1 F (36.2 C)    TempSrc:  Oral    SpO2: 97%   97%  Weight:   276 lb 0.3 oz (125.2 kg)   Height:        Intake/Output Summary (Last 24 hours) at 01/04/2018 0838 Last data filed at 01/04/2018 0400 Gross per 24 hour  Intake 1871.67 ml  Output 1250 ml  Net 621.67 ml   Filed Weights   01/02/18 2030 01/03/18 0400 01/04/18 0406  Weight: 269 lb 13.5 oz (122.4 kg) 269 lb 6.4 oz (122.2 kg) 276 lb 0.3 oz (125.2 kg)    Physical Exam:  General: A/O 4, positive acute respiratory distress (improved from 1/11) Neck:  Negative scars, masses, torticollis, lymphadenopathy, JVD Lungs: diffuse expiratory wheezing, negative crackles  Cardiovascular: Sinus tachycardia (paced) Regular rhythm without murmur gallop or rub normal S1 and S2 Abdomen: MORBIDLY OBESE, negative abdominal pain, nondistended, positive soft, bowel sounds, no rebound, no ascites, no appreciable mass  Extremities: No significant cyanosis, clubbing, bilateral lower extremity edema 2-3+ to knees Skin: Negative rashes, lesions, ulcers Psychiatric:  Negative depression, negative anxiety, negative fatigue, negative mania  Central nervous system:  Cranial nerves II through XII intact, tongue/uvula midline, all extremities muscle strength 5/5, sensation intact throughout, negative dysarthria, negative expressive aphasia, negative receptive aphasia. .     Data Reviewed: Care during the described time interval was provided by me .  I have reviewed this patient's available data, including medical history, events of note, physical examination, and all test results as part of my evaluation.   CBC: Recent Labs  Lab 01/02/18 1515 01/03/18 0827  WBC 14.2* 9.5  NEUTROABS  --  7.2  HGB 13.2 10.4*  HCT 40.3 33.9*  MCV 93.5 93.4  PLT 194 644*   Basic Metabolic  Panel: Recent Labs  Lab 01/02/18 1515 01/03/18 0943 01/04/18 0648  NA 135 136 129*  K 4.2 3.6 4.6  CL 95* 98* 90*  CO2 28 25 23   GLUCOSE 51* 104* 484*  BUN 35* 32* 44*  CREATININE 2.16* 2.20* 2.49*  CALCIUM 8.8* 8.2* 8.5*  MG  --  2.0 2.1   GFR: Estimated Creatinine Clearance: 36.1 mL/min (A) (by C-G formula based on SCr of 2.49 mg/dL (H)). Liver Function Tests: Recent Labs  Lab 01/02/18 1515 01/03/18 0943  AST 51* 46*  ALT 29 26  ALKPHOS 170* 134*  BILITOT 1.1 1.3*  PROT 7.9 5.9*  ALBUMIN 3.8 3.0*   Recent Labs  Lab 01/02/18 1515  LIPASE 45   No results for input(s): AMMONIA in the last 168 hours. Coagulation Profile: Recent Labs  Lab 01/02/18 1515  INR 2.11   Cardiac Enzymes: Recent Labs  Lab 01/02/18 1515 01/03/18 0208 01/03/18 0827 01/03/18 1319  TROPONINI 0.03* 0.04* 0.03* 0.03*   BNP (last 3 results) Recent Labs    09/17/17 1627 11/04/17 1112  PROBNP 6,365* 6,012*   HbA1C: Recent Labs    01/03/18 0827  HGBA1C 5.6   CBG: Recent Labs  Lab 01/03/18 1256 01/03/18 1936 01/03/18 2310 01/04/18 0358 01/04/18 0731  GLUCAP 276* 232* 365* 399* 399*   Lipid Profile: No results for input(s): CHOL, HDL, LDLCALC, TRIG, CHOLHDL, LDLDIRECT in the last 72 hours. Thyroid Function Tests: Recent Labs    01/03/18 0208  TSH 6.384*   Anemia Panel: No results for input(s): VITAMINB12, FOLATE, FERRITIN, TIBC, IRON, RETICCTPCT in the last 72 hours. Urine analysis:    Component Value Date/Time   COLORURINE YELLOW 01/02/2018 1620   APPEARANCEUR CLEAR 01/02/2018 1620   LABSPEC 1.025 01/02/2018 1620   PHURINE 5.5 01/02/2018 1620   GLUCOSEU NEGATIVE 01/02/2018 1620   HGBUR NEGATIVE 01/02/2018 1620   BILIRUBINUR NEGATIVE 01/02/2018 1620   KETONESUR NEGATIVE 01/02/2018 1620   PROTEINUR NEGATIVE 01/02/2018 1620   UROBILINOGEN 1.0 01/21/2008 1430   NITRITE NEGATIVE 01/02/2018 1620   LEUKOCYTESUR NEGATIVE 01/02/2018 1620   Sepsis  Labs: @LABRCNTIP (procalcitonin:4,lacticidven:4)  ) Recent Results (from the past 240 hour(s))  MRSA PCR Screening     Status: None   Collection Time: 01/02/18  3:18 PM  Result Value Ref Range Status   MRSA by PCR NEGATIVE NEGATIVE Final    Comment:        The GeneXpert MRSA Assay (FDA approved for NASAL specimens only), is one component of a comprehensive MRSA colonization surveillance program. It is not intended to diagnose MRSA infection nor to guide or monitor treatment for MRSA infections.   Blood Culture (routine x 2)  Status: None (Preliminary result)   Collection Time: 01/02/18  3:30 PM  Result Value Ref Range Status   Specimen Description BLOOD LEFT ANTECUBITAL  Final   Special Requests   Final    BOTTLES DRAWN AEROBIC AND ANAEROBIC Blood Culture results may not be optimal due to an excessive volume of blood received in culture bottles   Culture   Final    NO GROWTH < 24 HOURS Performed at [facility] [address], [place], [place] [address]    Report Status PENDING  Incomplete  Blood Culture (routine x 2)     Status: None (Preliminary result)   Collection Time: 01/02/18  3:45 PM  Result Value Ref Range Status   Specimen Description BLOOD RIGHT ANTECUBITAL  Final   Special Requests   Final    BOTTLES DRAWN AEROBIC AND ANAEROBIC Blood Culture adequate volume   Culture   Final    NO GROWTH < 24 HOURS Performed at [facility], [place] [address], [place], [place] [address]    Report Status PENDING  Incomplete  Respiratory Panel by PCR     Status: Abnormal   Collection Time: 01/03/18  1:09 PM  Result Value Ref Range Status   Adenovirus NOT DETECTED NOT DETECTED Final   Coronavirus 229E NOT DETECTED NOT DETECTED Final   Coronavirus HKU1 NOT DETECTED NOT DETECTED Final   Coronavirus NL63 NOT DETECTED NOT DETECTED Final   Coronavirus OC43 DETECTED (A) NOT DETECTED Final   Metapneumovirus NOT DETECTED NOT DETECTED Final   Rhinovirus / Enterovirus NOT  DETECTED NOT DETECTED Final   Influenza A NOT DETECTED NOT DETECTED Final   Influenza B NOT DETECTED NOT DETECTED Final   Parainfluenza Virus 1 NOT DETECTED NOT DETECTED Final   Parainfluenza Virus 2 NOT DETECTED NOT DETECTED Final   Parainfluenza Virus 3 NOT DETECTED NOT DETECTED Final   Parainfluenza Virus 4 NOT DETECTED NOT DETECTED Final   Respiratory Syncytial Virus NOT DETECTED NOT DETECTED Final   Bordetella pertussis NOT DETECTED NOT DETECTED Final   Chlamydophila pneumoniae NOT DETECTED NOT DETECTED Final   Mycoplasma pneumoniae NOT DETECTED NOT DETECTED Final         Radiology Studies: Dg Chest Portable 1 View  Result Date: 01/02/2018 CLINICAL DATA:  Sent from Dr Dorisann Frames office with SOB and CHF. His blood sugar was 36 in the office. He is cyanotic and dyspneic, hx of diabetes, coronary artery disease, HTN, hyperlipidemia, renal insufficiency EXAM: PORTABLE CHEST 1 VIEW COMPARISON:  Chest x-rays dated 12/04/2017 and 01/30/2017. FINDINGS: Mild cardiomegaly is stable. Left chest wall pacemaker hardware appears stable in positioning. Aortic atherosclerosis. Bilateral interstitial prominence, slightly increased compared to the previous studies indicating CHF superimposed on chronic interstitial lung disease. No confluent opacity to suggest a developing pneumonia. No pleural effusion or pneumothorax seen. Median sternotomy wires appear intact and stable in alignment. No acute or suspicious osseous finding. IMPRESSION: 1. Bilateral interstitial edema superimposed on chronic interstitial lung disease, indicating active mild CHF. 2. Stable mild cardiomegaly.  Pacemaker in place. 3. Aortic atherosclerosis. Electronically Signed   By: Franki Cabot M.D.   On: 01/02/2018 15:43        Scheduled Meds: . amiodarone  200 mg Oral Daily  . apixaban  5 mg Oral BID  . aspirin EC  81 mg Oral Daily  . chlorhexidine  15 mL Mouth Rinse BID  . dextromethorphan-guaiFENesin  1 tablet Oral BID  .  insulin aspart  0-9 Units Subcutaneous TID WC  .  ipratropium-albuterol  3 mL Nebulization Q6H  . mouth rinse  15 mL Mouth Rinse q12n4p  . methylPREDNISolone (SOLU-MEDROL) injection  60 mg Intravenous Q12H  . nebivolol  5 mg Oral Daily  . pantoprazole  40 mg Oral Daily  . simvastatin  20 mg Oral QHS  . sodium chloride flush  3 mL Intravenous Q12H   Continuous Infusions: . sodium chloride    . azithromycin Stopped (01/03/18 1722)  . cefTRIAXone (ROCEPHIN)  IV Stopped (01/03/18 1652)     LOS: 2 days    Time spent: 40 minutes    Ellisyn Icenhower, Geraldo Docker, MD [facility] Pager [phone]   If 7PM-7AM, please contact night-coverage [email or web address] Password [facility] 01/04/2018, 8:38 AM

## 2018-01-04 NOTE — Progress Notes (Signed)
CBG 505. Dr Sherral Hammers at bedside. Verbal order for 20u Novolog given. Will administer. Will continue to monitor.

## 2018-01-04 NOTE — Progress Notes (Signed)
Patient resting comfortably on 2L [place]. BIPAP not needed at this time. RT will monitor as needed.

## 2018-01-04 NOTE — Plan of Care (Signed)
Patient is able to verbalize his needs and pain. He understands all education given and asked questions.

## 2018-01-05 DIAGNOSIS — E162 Hypoglycemia, unspecified: Secondary | ICD-10-CM

## 2018-01-05 DIAGNOSIS — J129 Viral pneumonia, unspecified: Secondary | ICD-10-CM

## 2018-01-05 DIAGNOSIS — A419 Sepsis, unspecified organism: Secondary | ICD-10-CM

## 2018-01-05 DIAGNOSIS — E782 Mixed hyperlipidemia: Secondary | ICD-10-CM

## 2018-01-05 LAB — CBC
HCT: 36.8 % — ABNORMAL LOW (ref 39.0–52.0)
HEMOGLOBIN: 11.7 g/dL — AB (ref 13.0–17.0)
MCH: 29.7 pg (ref 26.0–34.0)
MCHC: 31.8 g/dL (ref 30.0–36.0)
MCV: 93.4 fL (ref 78.0–100.0)
Platelets: 206 10*3/uL (ref 150–400)
RBC: 3.94 MIL/uL — AB (ref 4.22–5.81)
RDW: 16 % — ABNORMAL HIGH (ref 11.5–15.5)
WBC: 17.5 10*3/uL — ABNORMAL HIGH (ref 4.0–10.5)

## 2018-01-05 LAB — GLUCOSE, CAPILLARY
GLUCOSE-CAPILLARY: 269 mg/dL — AB (ref 65–99)
Glucose-Capillary: 99 mg/dL (ref 65–99)
Glucose-Capillary: 106 mg/dL — ABNORMAL HIGH (ref 65–99)
Glucose-Capillary: 177 mg/dL — ABNORMAL HIGH (ref 65–99)
Glucose-Capillary: 307 mg/dL — ABNORMAL HIGH (ref 65–99)

## 2018-01-05 LAB — BASIC METABOLIC PANEL
Anion gap: 13 (ref 5–15)
BUN: 57 mg/dL — ABNORMAL HIGH (ref 6–20)
CALCIUM: 8.9 mg/dL (ref 8.9–10.3)
CO2: 26 mmol/L (ref 22–32)
Chloride: 93 mmol/L — ABNORMAL LOW (ref 101–111)
Creatinine, Ser: 2.64 mg/dL — ABNORMAL HIGH (ref 0.61–1.24)
GFR, EST AFRICAN AMERICAN: 26 mL/min — AB (ref >60)
GFR, EST NON AFRICAN AMERICAN: 23 mL/min — AB (ref >60)
Glucose, Bld: 107 mg/dL — ABNORMAL HIGH (ref 65–99)
POTASSIUM: 4.9 mmol/L (ref 3.5–5.1)
SODIUM: 132 mmol/L — AB (ref 135–145)

## 2018-01-05 LAB — MAGNESIUM: MAGNESIUM: 2.2 mg/dL (ref 1.7–2.4)

## 2018-01-05 MED ORDER — INSULIN GLARGINE 100 UNIT/ML ~~LOC~~ SOLN
5.0000 [IU] | Freq: Every day | SUBCUTANEOUS | Status: DC
  Administered 2018-01-05 – 2018-01-09 (×5): 5 [IU] via SUBCUTANEOUS
  Filled 2018-01-05 (×6): qty 0.05

## 2018-01-05 MED ORDER — INSULIN ASPART 100 UNIT/ML ~~LOC~~ SOLN
0.0000 [IU] | Freq: Every day | SUBCUTANEOUS | Status: DC
  Administered 2018-01-05: 3 [IU] via SUBCUTANEOUS
  Administered 2018-01-07 – 2018-01-08 (×2): 2 [IU] via SUBCUTANEOUS

## 2018-01-05 MED ORDER — FUROSEMIDE 10 MG/ML IJ SOLN
80.0000 mg | Freq: Once | INTRAMUSCULAR | Status: AC
  Administered 2018-01-05: 80 mg via INTRAVENOUS
  Filled 2018-01-05: qty 8

## 2018-01-05 MED ORDER — FUROSEMIDE 10 MG/ML IJ SOLN
20.0000 mg | Freq: Every day | INTRAMUSCULAR | Status: DC
  Administered 2018-01-06: 20 mg via INTRAVENOUS
  Filled 2018-01-05: qty 2

## 2018-01-05 MED ORDER — INSULIN ASPART 100 UNIT/ML ~~LOC~~ SOLN
0.0000 [IU] | Freq: Three times a day (TID) | SUBCUTANEOUS | Status: DC
  Administered 2018-01-05: 4 [IU] via SUBCUTANEOUS
  Administered 2018-01-06: 7 [IU] via SUBCUTANEOUS
  Administered 2018-01-06: 4 [IU] via SUBCUTANEOUS
  Administered 2018-01-06: 11 [IU] via SUBCUTANEOUS
  Administered 2018-01-07 (×3): 4 [IU] via SUBCUTANEOUS
  Administered 2018-01-08 (×2): 3 [IU] via SUBCUTANEOUS
  Administered 2018-01-09 (×2): 4 [IU] via SUBCUTANEOUS
  Administered 2018-01-09 – 2018-01-10 (×3): 3 [IU] via SUBCUTANEOUS
  Administered 2018-01-10: 7 [IU] via SUBCUTANEOUS
  Administered 2018-01-11: 4 [IU] via SUBCUTANEOUS
  Administered 2018-01-11: 3 [IU] via SUBCUTANEOUS
  Administered 2018-01-12 – 2018-01-13 (×2): 4 [IU] via SUBCUTANEOUS

## 2018-01-05 NOTE — Progress Notes (Signed)
Received a call from [place] about pt not showing pacer spikes on the monitor with pt heart rate in the 120's. This nurse got an EKG that confirmed the pt was being ventricular paced with visible pacer spikes. I contacted on call NP Baltazar Najjar to let her know that the pace maker wasn't showing on the monitor. No new orders, will continue to monitor.

## 2018-01-05 NOTE — Evaluation (Signed)
Occupational Therapy Evaluation Patient Details Name: Lawrence [name] MRN: [id number] DOB: 03-05-1946 Today's Date: 01/05/2018    History of Present Illness 72 y.o.WM retired Manufacturing systems engineer PMHx Anemia, COPD, Chronic ischemic heart disease S/P pacemaker, unspecified, OSA on CPAP, CAD, Diabetes mellitus type II, with peripheral neuropathy, Essential HTN, HLD, Nodule of kidney, Nontoxic multinodular goiter, Obesity, Other testicular hypofunction, PUD, CKD.This past week, his sugars started bottoming out (glucose was 28 at home and 38 in cardiology office) and he is off all diabetic medications.  He has been having difficulty with his breathing the last month but worse in the last 2 weeks.  +cough, productive of yellow or clear phlegm the last few days.  Temp to 99.6 yesterday.  HR is usually in 65-70 range, in the 120s for the last few days.  +LE edema.  He was given Lasix and that worked really well today with good diuresis.    Clinical Impression   PTA Pt independent in home with ADL and mobility, uses a rollator for community mobility and enjoys exercise. Pt is currently min +2 for transfer (mainly to manage lines and for safety). He is able to perform bed level LB dressing, set up for grooming, but overall presents with generalized weakness and decreased activity tolerance (see full OT problem list below) Pt will benefit from skilled OT in the acute setting and aftwards at [facility] level to maximize safety and independence in ADL and functional transfers - and IADL and caregiving duties for mother-in-law at home health level. Next session to focus on standing grooming for activity tolerance and energy conservation education.     Follow Up Recommendations  Home health OT;Supervision/Assistance - 24 hour    Equipment Recommendations  3 in 1 bedside commode    Recommendations for Other Services       Precautions / Restrictions Precautions Precautions: Fall Precaution Comments:  DROPLET Restrictions Weight Bearing Restrictions: No      Mobility Bed Mobility Overal bed mobility: Independent             General bed mobility comments: incr time but able to complete with out help  Transfers Overall transfer level: Needs assistance Equipment used: Rolling walker (2 wheeled) Transfers: Sit to/from Stand Sit to Stand: Min assist;+2 safety/equipment         General transfer comment: vc for safe hand placement with RW; Pt able to come to stand but needed steadying min assist for balance.   Took pivotal steps bed to recliner with steadying assist as he went.  needed assist to control descent into chair.     Balance Overall balance assessment: Needs assistance Sitting-balance support: No upper extremity supported;Feet supported Sitting balance-Leahy Scale: Fair Sitting balance - Comments: supervision for sitting EOB   Standing balance support: Single extremity supported;During functional activity Standing balance-Leahy Scale: Poor Standing balance comment: reliant on BUE support on RW                           ADL either performed or assessed with clinical judgement   ADL Overall ADL's : Needs assistance/impaired Eating/Feeding: Modified independent;Sitting   Grooming: Set up;Sitting;Wash/dry hands;Wash/dry face Grooming Details (indicate cue type and reason): in recliner Upper Body Bathing: Minimal assistance Upper Body Bathing Details (indicate cue type and reason): extended time, fatigues quickly Lower Body Bathing: Minimal assistance;With adaptive equipment;Sitting/lateral leans   Upper Body Dressing : Set up;Sitting   Lower Body Dressing: Moderate assistance;Sit to/from stand;+2 for safety/equipment  Lower Body Dressing Details (indicate cue type and reason): able to don socks at bed level by bringing feet cross to knees Toilet Transfer: Minimal assistance;+2 for physical assistance;+2 for safety/equipment;Stand-pivot;BSC;RW    Toileting- Clothing Manipulation and Hygiene: Moderate assistance;Sit to/from stand       Functional mobility during ADLs: Minimal assistance;+2 for safety/equipment;Rolling walker General ADL Comments: Pt fatgiues very quickly     Vision         Perception     Praxis      Pertinent Vitals/Pain Pain Assessment: No/denies pain     Hand Dominance     Extremity/Trunk Assessment Upper Extremity Assessment Upper Extremity Assessment: Overall WFL for tasks assessed   Lower Extremity Assessment Lower Extremity Assessment: Generalized weakness   Cervical / Trunk Assessment Cervical / Trunk Assessment: Normal   Communication Communication Communication: No difficulties   Cognition Arousal/Alertness: Awake/alert Behavior During Therapy: WFL for tasks assessed/performed Overall Cognitive Status: Within Functional Limits for tasks assessed                                     General Comments  VSS throughout session    Exercises     Shoulder Instructions      Home Living Family/patient expects to be discharged to:: Private residence Living Arrangements: Spouse/significant other Available Help at Discharge: Family;Available 24 hours/day Type of Home: House Home Access: Stairs to enter [employer] of Steps: 3 Entrance Stairs-Rails: Right;Left;Can reach both Home Layout: Multi-level Alternate Level Stairs-Number of Steps: 1(to den) Alternate Level Stairs-Rails: (has a hutch they hold onto per wife) Bathroom Shower/Tub: Walk-in Psychologist, prison and probation services: Handicapped height     Home Equipment: Clinical cytogeneticist - 4 wheels   Additional Comments: Pt reports he used rollator when they went out, no device in house.  Of note, mother in law also stays with pt; and wife is caregiver for both.  She states that she gets one well and the other is sick.        Prior Functioning/Environment Level of Independence: Independent with assistive device(s)         Comments: used rollator in community.  did all B/D on his own.         OT Problem List: Decreased strength;Decreased activity tolerance;Impaired balance (sitting and/or standing);Cardiopulmonary status limiting activity;Obesity;Increased edema      OT Treatment/Interventions: Self-care/ADL training;Therapeutic exercise;Energy conservation;DME and/or AE instruction;Therapeutic activities;Patient/family education;Balance training    OT Goals(Current goals can be found in the care plan section) Acute Rehab OT Goals Patient Stated Goal: to get better OT Goal Formulation: With patient Time For Goal Achievement: 01/19/18 Potential to Achieve Goals: Good ADL Goals Pt Will Perform Grooming: with supervision;standing Pt Will Transfer to Toilet: with modified independence;ambulating Pt Will Perform Toileting - Clothing Manipulation and hygiene: Independently;sit to/from stand Additional ADL Goal #1: Pt will recall 3 ways of conserving energy with one or less verbal cues  OT Frequency: Min 2X/week   Barriers to D/C:            Co-evaluation              AM-PAC PT "6 Clicks" Daily Activity     Outcome Measure Help from another person eating meals?: None Help from another person taking care of personal grooming?: A Little(in sitting) Help from another person toileting, which includes using toliet, bedpan, or urinal?: A Little Help from another person bathing (including washing,  rinsing, drying)?: A Little Help from another person to put on and taking off regular upper body clothing?: None Help from another person to put on and taking off regular lower body clothing?: A Lot 6 Click Score: 19   End of Session Equipment Utilized During Treatment: Gait belt;Rolling walker Nurse Communication: Mobility status;Other (comment)(VSS; no chair alarm)  Activity Tolerance: Patient tolerated treatment well;Patient limited by lethargy Patient left: in chair;with call bell/phone within  reach  OT Visit Diagnosis: Unsteadiness on feet (R26.81);Other abnormalities of gait and mobility (R26.89);Muscle weakness (generalized) (M62.81)                Time: 1102-1117 OT Time Calculation (min): 23 min Charges:  OT General Charges $OT Visit: 1 Visit OT Evaluation $OT Eval Moderate Complexity: 1 Mod OT Treatments $Self Care/Home Management : 8-22 mins G-Codes:     Hulda Humphrey OTR/L [place] 01/05/2018, 11:42 AM

## 2018-01-05 NOTE — Progress Notes (Signed)
PROGRESS NOTE    Lawrence [name]  PYK:[id number] DOB: 1946/05/15 DOA: 01/02/2018 PCP: [name], MD   Brief Narrative:  72 y.o. WM  retired Manufacturing systems engineer PMHx Anemia, COPD, Chronic ischemic heart disease S/P pacemaker, unspecified, OSA on CPAP, CAD, Diabetes mellitus type II, with peripheral neuropathy, Essential HTN, HLD, Nodule of kidney, Nontoxic multinodular goiter, Obesity, Other testicular hypofunction, PUD, CKD  Hospitalized in the [place] and he ended up with afib which ended up not reversing.   He ended up with a pacemaker in 8/18.  This past week, his sugars started bottoming out (glucose was 28 at home and 38 in cardiology office) and he is off all diabetic medications.  He has been having difficulty with his breathing the last month but worse in the last 2 weeks.  +cough, productive of yellow or clear phlegm the last few days.  Temp to 99.6 yesterday.  HR is usually in 65-70 range, in the 120s for the last few days.  +LE edema.  He was given Lasix and that worked really well today with good diuresis.  Currently he doesn't think his breathing is all that bad right now.  Denies chest pain.   ED Course: Suspect acute hypoxic respiratory failure requiring non invasive mechanical ventilation BIPAP 2/2 to CAP complicated by acute on chronic heart failure. Wbc 14k, HR 118, RR 36. Meets SIRS criteria with possible pneumonia as source of infection.       Subjective: 1/13 A/O 4, negative CP, negative abdominal pain, negative N/V. Positive SOB improving. Positive cough (patient becoming fluid overloaded)   Assessment & Plan:   Principal Problem:   Acute on chronic diastolic CHF (congestive heart failure) (HCC) Active Problems:   Hyperlipidemia, mixed   Essential hypertension, benign   Paroxysmal atrial fibrillation (HCC)   Hypoglycemia  SIRS -Lactic acid elevated. Continue to trend -KVO normal saline. Becoming fluid overloaded -Complete 7 days course antibiotics     Acute Respiratory Failure with Hypoxia/ viral CAP (positive coronavirus)  -Continue current antibiotic 7 days -DuoNeb QID -Solu-Medrol 60 mg daily -Flutter valve -Guaifenesin + codeine PRN cough -Mucinex DM BID  -PCXR: Consistent with pneumonia ? + interstitial edema. -Titrate O2 to maintain SPO2 89-93%  Paroxysmal atrial fibrillation? -See CHF  Acute on CKD stage III (baseline Cr~1.83) Recent Labs  Lab 01/02/18 1515 01/03/18 0943 01/04/18 0648 01/04/18 1332 01/05/18 0637  CREATININE 2.16* 2.20* 2.49* 2.28* 2.64*   Acute/chronic diastolic CHF (base NLZJQB~341 kg/240 pound)/slightly Hypotensive  -Strict in and out since admission +3.4L -Daily weight Filed Weights   01/03/18 0400 01/04/18 0406 01/05/18 0331  Weight: 269 lb 6.4 oz (122.2 kg) 276 lb 0.3 oz (125.2 kg) 278 lb 7.1 oz (126.3 kg)  -Amiodarone 200 mg daily -(Hold) Lasix 80 mg BID  -(Hold) Lisinopril may be contributing to patient's acute cough/hypotension. -Nebivolol 5 mg daily -Albumin 50 g -1/13 Lasix IV 80 mg 1:  -Begin to gently diuresis Lasix 20 mg daily starting on 1/14  Essential HTN -See CHF   Diabetes type 2 controlled with complication/Hyperglycemia -Patient having episodes of SEVERE HYPOGLYCEMIA. Prior to admission patient's PCP discontinued his Cogentin, Amaryl, Lantus secondary to hypoglycemia. Per patient not started ~ one week ago -1/11 Hemoglobin A1c = 5.6 -1/12 start insulin drip. Patient hyperglycemic secondary to steroids. Have also decreased steroid dose as we do not want patient to go into DKA -Lantus 5 units daily -Resistant SSI  Hypoglycemia/ -See diabetes -Hypoglycemia secondary to CHF? Insulinoma? -1/12 discontinue D10  -  Normal saline 100 ml/hr per glucose stabilizer protocol: CBG normalized    HLD -Continue Zocor    DVT prophylaxis: Eliquis Code Status: Full Family Communication: War buddies at bedside Disposition Plan: TBD   Consultants:   None    Procedures/Significant Events:  1/11 Echocardiogram:- Left ventricle: Poor image quality with foreshortened views even   with definity precludes accurate assessment of EF and wall   motion. Recommend cardiac MRI for further assessment. -moderate concentric hypertrophy. The   study was not technically sufficient to allow evaluation of LV   diastolic dysfunction due to atrial fibrillation. - Aortic valve: Poorly visualized. A bicuspid morphology cannot be   excluded; moderately thickened, moderately calcified leaflets. -- Tricuspid valve: There was mild-moderate regurgitation. 1/11 PCXR:-Bilateral interstitial edema superimposed on chronic interstitial lung disease, indicating active mild CHF. -Stable mild cardiomegaly.  -Pacemaker in place.     I have personally reviewed and interpreted all radiology studies and my findings are as above.  VENTILATOR SETTINGS:    Cultures 1/10 blood NGTD 1/10 urine insignificant growth 1/11 blood NGTD 1/11 respiratory virus panel positive coronavirus      Antimicrobials: Anti-infectives (From admission, onward)   Start     Stop   01/03/18 1630  vancomycin (VANCOCIN) 1,500 mg in sodium chloride 0.9 % 500 mL IVPB  Status:  Discontinued     01/03/18 0159   01/03/18 1400  cefTRIAXone (ROCEPHIN) 1 g in dextrose 5 % 50 mL IVPB     01/10/18 1359   01/03/18 1400  azithromycin (ZITHROMAX) 500 mg in dextrose 5 % 250 mL IVPB     01/10/18 1359   01/03/18 0000  piperacillin-tazobactam (ZOSYN) IVPB 3.375 g  Status:  Discontinued     01/03/18 0159   01/02/18 1615  piperacillin-tazobactam (ZOSYN) IVPB 3.375 g     01/02/18 1645   01/02/18 1615  vancomycin (VANCOCIN) IVPB 1000 mg/200 mL premix  Status:  Discontinued     01/02/18 1608   01/02/18 1615  vancomycin (VANCOCIN) IVPB 1000 mg/200 mL premix  Status:  Discontinued     01/03/18 0159   01/02/18 1615  vancomycin (VANCOCIN) IVPB 1000 mg/200 mL premix     01/02/18 1718        Devices    LINES / TUBES:      Continuous Infusions: . sodium chloride    . sodium chloride 100 mL/hr at 01/04/18 0906  . sodium chloride 100 mL/hr at 01/05/18 0627  . azithromycin Stopped (01/04/18 1531)  . cefTRIAXone (ROCEPHIN)  IV Stopped (01/04/18 1501)     Objective: Vitals:   01/05/18 0331 01/05/18 0400 01/05/18 0600 01/05/18 0721  BP: 118/73 112/77 121/66   Pulse:  (!) 120 (!) 36   Resp:  16 18   Temp: 97.8 F (36.6 C)     TempSrc: Oral     SpO2:  94% 97% 99%  Weight: 278 lb 7.1 oz (126.3 kg)     Height:        Intake/Output Summary (Last 24 hours) at 01/05/2018 0843 Last data filed at 01/05/2018 0600 Gross per 24 hour  Intake 3373.94 ml  Output 600 ml  Net 2773.94 ml   Filed Weights   01/03/18 0400 01/04/18 0406 01/05/18 0331  Weight: 269 lb 6.4 oz (122.2 kg) 276 lb 0.3 oz (125.2 kg) 278 lb 7.1 oz (126.3 kg)    Physical Exam:  General: A/O 4, positive acute respiratory distress, significantly improved Neck:  Negative scars, masses, torticollis, lymphadenopathy, JVD Lungs: bilateral upper  lobes mild expiratory wheeze, bibasilar mild crackles.  Cardiovascular: Sinus tachycardia (paced) Regular rhythm without murmur gallop or rub normal S1 and S2 Abdomen: Morbidly obese, negative abdominal pain, nondistended, positive soft, bowel sounds, no rebound, no ascites, no appreciable mass Extremities: No significant cyanosis, clubbing, positive bilateral lower extremity edema 2-3+ to thigh.  Skin: Negative rashes, lesions, ulcers Psychiatric:  Negative depression, negative anxiety, negative fatigue, negative mania  Central nervous system:  Cranial nerves II through XII intact, tongue/uvula midline, all extremities muscle strength 5/5, sensation intact throughout, negative dysarthria, negative expressive aphasia, negative receptive aphasia..     Data Reviewed: Care during the described time interval was provided by me .  I have reviewed this patient's  available data, including medical history, events of note, physical examination, and all test results as part of my evaluation.   CBC: Recent Labs  Lab 01/02/18 1515 01/03/18 0827 01/04/18 0648 01/05/18 0637  WBC 14.2* 9.5 9.8 17.5*  NEUTROABS  --  7.2  --   --   HGB 13.2 10.4* 12.0* 11.7*  HCT 40.3 33.9* 37.9* 36.8*  MCV 93.5 93.4 94.5 93.4  PLT 194 142* 142* 254   Basic Metabolic Panel: Recent Labs  Lab 01/02/18 1515 01/03/18 0943 01/04/18 0648 01/04/18 1332 01/05/18 0637  NA 135 136 129* 125* 132*  K 4.2 3.6 4.6 4.4 4.9  CL 95* 98* 90* 90* 93*  CO2 28 25 23  20* 26  GLUCOSE 51* 104* 484* 499* 107*  BUN 35* 32* 44* 49* 57*  CREATININE 2.16* 2.20* 2.49* 2.28* 2.64*  CALCIUM 8.8* 8.2* 8.5* 8.4* 8.9  MG  --  2.0 2.1 2.0 2.2   GFR: Estimated Creatinine Clearance: 34.2 mL/min (A) (by C-G formula based on SCr of 2.64 mg/dL (H)). Liver Function Tests: Recent Labs  Lab 01/02/18 1515 01/03/18 0943  AST 51* 46*  ALT 29 26  ALKPHOS 170* 134*  BILITOT 1.1 1.3*  PROT 7.9 5.9*  ALBUMIN 3.8 3.0*   Recent Labs  Lab 01/02/18 1515  LIPASE 45   No results for input(s): AMMONIA in the last 168 hours. Coagulation Profile: Recent Labs  Lab 01/02/18 1515  INR 2.11   Cardiac Enzymes: Recent Labs  Lab 01/02/18 1515 01/03/18 0208 01/03/18 0827 01/03/18 1319  TROPONINI 0.03* 0.04* 0.03* 0.03*   BNP (last 3 results) Recent Labs    09/17/17 1627 11/04/17 1112  PROBNP 6,365* 6,012*   HbA1C: Recent Labs    01/03/18 0827  HGBA1C 5.6   CBG: Recent Labs  Lab 01/04/18 1806 01/04/18 1906 01/04/18 2015 01/04/18 2317 01/05/18 0736  GLUCAP 371* 302* 231* 130* 99   Lipid Profile: No results for input(s): CHOL, HDL, LDLCALC, TRIG, CHOLHDL, LDLDIRECT in the last 72 hours. Thyroid Function Tests: Recent Labs    01/03/18 0208  TSH 6.384*   Anemia Panel: No results for input(s): VITAMINB12, FOLATE, FERRITIN, TIBC, IRON, RETICCTPCT in the last 72 hours. Urine  analysis:    Component Value Date/Time   COLORURINE YELLOW 01/02/2018 1620   APPEARANCEUR CLEAR 01/02/2018 1620   LABSPEC 1.025 01/02/2018 1620   PHURINE 5.5 01/02/2018 1620   GLUCOSEU NEGATIVE 01/02/2018 1620   HGBUR NEGATIVE 01/02/2018 1620   BILIRUBINUR NEGATIVE 01/02/2018 1620   KETONESUR NEGATIVE 01/02/2018 1620   PROTEINUR NEGATIVE 01/02/2018 1620   UROBILINOGEN 1.0 01/21/2008 1430   NITRITE NEGATIVE 01/02/2018 1620   LEUKOCYTESUR NEGATIVE 01/02/2018 1620   Sepsis Labs: @LABRCNTIP (procalcitonin:4,lacticidven:4)  ) Recent Results (from the past 240 hour(s))  MRSA PCR Screening  Status: None   Collection Time: 01/02/18  3:18 PM  Result Value Ref Range Status   MRSA by PCR NEGATIVE NEGATIVE Final    Comment:        The GeneXpert MRSA Assay (FDA approved for NASAL specimens only), is one component of a comprehensive MRSA colonization surveillance program. It is not intended to diagnose MRSA infection nor to guide or monitor treatment for MRSA infections.   Blood Culture (routine x 2)     Status: None (Preliminary result)   Collection Time: 01/02/18  3:30 PM  Result Value Ref Range Status   Specimen Description BLOOD LEFT ANTECUBITAL  Final   Special Requests   Final    BOTTLES DRAWN AEROBIC AND ANAEROBIC Blood Culture results may not be optimal due to an excessive volume of blood received in culture bottles   Culture   Final    NO GROWTH 2 DAYS Performed at [facility] [address] [place], [place] [address]    Report Status PENDING  Incomplete  Blood Culture (routine x 2)     Status: None (Preliminary result)   Collection Time: 01/02/18  3:45 PM  Result Value Ref Range Status   Specimen Description BLOOD RIGHT ANTECUBITAL  Final   Special Requests   Final    BOTTLES DRAWN AEROBIC AND ANAEROBIC Blood Culture adequate volume   Culture   Final    NO GROWTH 2 DAYS Performed at [facility], [place] [address], [place], [place] [address]     Report Status PENDING  Incomplete  Urine culture     Status: Abnormal   Collection Time: 01/02/18  4:20 PM  Result Value Ref Range Status   Specimen Description URINE, RANDOM  Final   Special Requests NONE  Final   Culture (A)  Final    <10,000 COLONIES/mL INSIGNIFICANT GROWTH Performed at [facility], [place] [address], [place], [place] [address]    Report Status 01/04/2018 FINAL  Final  Respiratory Panel by PCR     Status: Abnormal   Collection Time: 01/03/18  1:09 PM  Result Value Ref Range Status   Adenovirus NOT DETECTED NOT DETECTED Final   Coronavirus 229E NOT DETECTED NOT DETECTED Final   Coronavirus HKU1 NOT DETECTED NOT DETECTED Final   Coronavirus NL63 NOT DETECTED NOT DETECTED Final   Coronavirus OC43 DETECTED (A) NOT DETECTED Final   Metapneumovirus NOT DETECTED NOT DETECTED Final   Rhinovirus / Enterovirus NOT DETECTED NOT DETECTED Final   Influenza A NOT DETECTED NOT DETECTED Final   Influenza B NOT DETECTED NOT DETECTED Final   Parainfluenza Virus 1 NOT DETECTED NOT DETECTED Final   Parainfluenza Virus 2 NOT DETECTED NOT DETECTED Final   Parainfluenza Virus 3 NOT DETECTED NOT DETECTED Final   Parainfluenza Virus 4 NOT DETECTED NOT DETECTED Final   Respiratory Syncytial Virus NOT DETECTED NOT DETECTED Final   Bordetella pertussis NOT DETECTED NOT DETECTED Final   Chlamydophila pneumoniae NOT DETECTED NOT DETECTED Final   Mycoplasma pneumoniae NOT DETECTED NOT DETECTED Final  Culture, blood (routine x 2) Call MD if unable to obtain prior to antibiotics being given     Status: None (Preliminary result)   Collection Time: 01/03/18  1:20 PM  Result Value Ref Range Status   Specimen Description BLOOD RIGHT ANTECUBITAL  Final   Special Requests IN PEDIATRIC BOTTLE Blood Culture adequate volume  Final   Culture NO GROWTH < 24 HOURS  Final   Report Status PENDING  Incomplete  Culture, blood (routine x 2) Call MD if unable to obtain prior to antibiotics being given      Status: None (Preliminary result)   Collection Time: 01/03/18  1:26 PM  Result Value Ref Range Status   Specimen Description BLOOD BLOOD RIGHT HAND  Final   Special Requests IN PEDIATRIC BOTTLE Blood Culture adequate volume  Final   Culture NO GROWTH < 24 HOURS  Final   Report Status PENDING  Incomplete         Radiology Studies: No results found.      Scheduled Meds: . amiodarone  200 mg Oral Daily  . apixaban  5 mg Oral BID  . aspirin EC  81 mg Oral Daily  . chlorhexidine  15 mL Mouth Rinse BID  . dextromethorphan-guaiFENesin  1 tablet Oral BID  . insulin aspart  0-20 Units Subcutaneous TID WC  . insulin aspart  0-5 Units Subcutaneous QHS  . insulin glargine  5 Units Subcutaneous Daily  . ipratropium-albuterol  3 mL Nebulization TID  . mouth rinse  15 mL Mouth Rinse q12n4p  . methylPREDNISolone (SOLU-MEDROL) injection  60 mg Intravenous Daily  . nebivolol  5 mg Oral Daily  . pantoprazole  40 mg Oral Daily  . simvastatin  20 mg Oral QHS  . sodium chloride flush  3 mL Intravenous Q12H   Continuous Infusions: . sodium chloride    . sodium chloride 100 mL/hr at 01/04/18 0906  . sodium chloride 100 mL/hr at 01/05/18 0627  . azithromycin Stopped (01/04/18 1531)  . cefTRIAXone (ROCEPHIN)  IV Stopped (01/04/18 1501)     LOS: 3 days    Time spent: 40 minutes    WOODS, Geraldo Docker, MD [facility] Pager [phone]   If 7PM-7AM, please contact night-coverage [email or web address] Password [employer] 01/05/2018, 8:43 AM

## 2018-01-06 LAB — CBC
HCT: 36.7 % — ABNORMAL LOW (ref 39.0–52.0)
Hemoglobin: 11.7 g/dL — ABNORMAL LOW (ref 13.0–17.0)
MCH: 29.4 pg (ref 26.0–34.0)
MCHC: 31.9 g/dL (ref 30.0–36.0)
MCV: 92.2 fL (ref 78.0–100.0)
PLATELETS: 233 10*3/uL (ref 150–400)
RBC: 3.98 MIL/uL — ABNORMAL LOW (ref 4.22–5.81)
RDW: 16.1 % — AB (ref 11.5–15.5)
WBC: 15.6 10*3/uL — AB (ref 4.0–10.5)

## 2018-01-06 LAB — BASIC METABOLIC PANEL
Anion gap: 14 (ref 5–15)
BUN: 71 mg/dL — AB (ref 6–20)
CALCIUM: 8.8 mg/dL — AB (ref 8.9–10.3)
CO2: 24 mmol/L (ref 22–32)
CREATININE: 2.44 mg/dL — AB (ref 0.61–1.24)
Chloride: 91 mmol/L — ABNORMAL LOW (ref 101–111)
GFR calc non Af Amer: 25 mL/min — ABNORMAL LOW (ref >60)
GFR, EST AFRICAN AMERICAN: 29 mL/min — AB (ref >60)
Glucose, Bld: 221 mg/dL — ABNORMAL HIGH (ref 65–99)
Potassium: 4.8 mmol/L (ref 3.5–5.1)
Sodium: 129 mmol/L — ABNORMAL LOW (ref 135–145)

## 2018-01-06 LAB — GLUCOSE, CAPILLARY
GLUCOSE-CAPILLARY: 188 mg/dL — AB (ref 65–99)
GLUCOSE-CAPILLARY: 165 mg/dL — AB (ref 65–99)
GLUCOSE-CAPILLARY: 214 mg/dL — AB (ref 65–99)
GLUCOSE-CAPILLARY: 262 mg/dL — AB (ref 65–99)
Glucose-Capillary: 109 mg/dL — ABNORMAL HIGH (ref 65–99)
Glucose-Capillary: 107 mg/dL — ABNORMAL HIGH (ref 65–99)
Glucose-Capillary: 170 mg/dL — ABNORMAL HIGH (ref 65–99)
Glucose-Capillary: 180 mg/dL — ABNORMAL HIGH (ref 65–99)
Glucose-Capillary: 41 mg/dL — CL (ref 65–99)
Glucose-Capillary: 183 mg/dL — ABNORMAL HIGH (ref 65–99)

## 2018-01-06 LAB — MAGNESIUM: Magnesium: 2.4 mg/dL (ref 1.7–2.4)

## 2018-01-06 MED ORDER — ACETAMINOPHEN 325 MG PO TABS: 650.0000 mg | ORAL_TABLET | ORAL | Status: DC | PRN

## 2018-01-06 MED ORDER — FUROSEMIDE 10 MG/ML IJ SOLN
60.0000 mg | Freq: Two times a day (BID) | INTRAMUSCULAR | Status: DC
  Administered 2018-01-07 – 2018-01-13 (×13): 60 mg via INTRAVENOUS
  Filled 2018-01-06 (×12): qty 6

## 2018-01-06 NOTE — Consult Note (Signed)
   [facility] CM Inpatient Consult   01/06/2018  Lawrence [name] 1946-12-23 [id number]  Patient screened for potential [place] [facility] services. Patient is in the [place] of the [place] [facility] services under patient's Medicare plan. Met with the patient at the bedside.  Patient very versed in HF assessment.  He states he weighs daily.  He states he has SIRS.  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 He endorses Dr. Lovette Cliche II in [place] as his primary care provider. Patient states he doesn't have any needs for home nurse visits at this time. Will follow for progression and follow up needs.   For questions contact:   Natividad Brood, RN BSN [facility] Liaison  [phone] business mobile phone Toll free office [phone]

## 2018-01-06 NOTE — Progress Notes (Signed)
Physical Therapy Treatment Patient Details Name: Lawrence [name] MRN: [id number] DOB: Aug 13, 1946 Today's Date: 01/06/2018    History of Present Illness 72 y.o.WM retired Manufacturing systems engineer PMHx Anemia, COPD, Chronic ischemic heart disease S/P pacemaker, unspecified, OSA on CPAP, CAD, Diabetes mellitus type II, with peripheral neuropathy, Essential HTN, HLD, Nodule of kidney, Nontoxic multinodular goiter, Obesity, Other testicular hypofunction, PUD, CKD.This past week, his sugars started bottoming out (glucose was 28 at home and 38 in cardiology office) and he is off all diabetic medications.  He has been having difficulty with his breathing the last month but worse in the last 2 weeks.  +cough, productive of yellow or clear phlegm the last few days.  Temp to 99.6 yesterday.  HR is usually in 65-70 range, in the 120s for the last few days.  +LE edema.  He was given Lasix and that worked really well today with good diuresis.     PT Comments    Pt admitted with above diagnosis. Pt currently with functional limitations due to the deficits listed below (see PT Problem List). Pt was able to ambulate on unit with min guard assist.  Desat to 88% on RA with activity  but quickly returns to above 90% with pursed lip breathing.  DOE 3/4 and required 1 seated rest break.  Pt progressing.  Pt will benefit from skilled PT to increase their independence and safety with mobility to allow discharge to the venue listed below.     Follow Up Recommendations  Home health PT;Supervision/Assistance - 24 hour     Equipment Recommendations  Other (comment)(TBA)    Recommendations for Other Services       Precautions / Restrictions Precautions Precautions: Fall Precaution Comments: DROPLET Restrictions Weight Bearing Restrictions: No    Mobility  Bed Mobility                  Transfers Overall transfer level: Needs assistance Equipment used: Rolling walker (2 wheeled) Transfers: Sit to/from  Stand Sit to Stand: +2 safety/equipment;Min guard         General transfer comment: vc for safe hand placement with RW.   Ambulation/Gait Ambulation/Gait assistance: Min guard;Min assist;+2 safety/equipment Ambulation Distance (Feet): 280 Feet(200 with seated rest, 80 feet) Assistive device: Rolling walker (2 wheeled) Gait Pattern/deviations: Step-through pattern;Decreased stride length;Wide base of support   Gait velocity interpretation: Below normal speed for age/gender General Gait Details: Pt ambulated with good walker safety and was able to incr distance with 1 seated rest break due to feeling fatigue and lightheaded. Pt was on RA with sats 88-93%.     Stairs            Wheelchair Mobility    Modified Rankin (Stroke Patients Only)       Balance Overall balance assessment: Needs assistance         Standing balance support: During functional activity;Bilateral upper extremity supported Standing balance-Leahy Scale: Poor Standing balance comment: reliant on BUE support on RW                            Cognition Arousal/Alertness: Awake/alert Behavior During Therapy: WFL for tasks assessed/performed Overall Cognitive Status: Within Functional Limits for tasks assessed                                        Exercises General Exercises -  Lower Extremity Ankle Circles/Pumps: AROM;Both;10 reps;Seated Long Arc Quad: AROM;Both;10 reps;Seated    General Comments        Pertinent Vitals/Pain Pain Assessment: No/denies pain  Sats 88% -94% on RA with activity.  Other VSS.   Home Living                      Prior Function            PT Goals (current goals can now be found in the care plan section) Acute Rehab PT Goals Patient Stated Goal: to get better Progress towards PT goals: Progressing toward goals    Frequency    Min 3X/week      PT Plan Current plan remains appropriate    Co-evaluation               AM-PAC PT "6 Clicks" Daily Activity  Outcome Measure  Difficulty turning over in bed (including adjusting bedclothes, sheets and blankets)?: None Difficulty moving from lying on back to sitting on the side of the bed? : None Difficulty sitting down on and standing up from a chair with arms (e.g., wheelchair, bedside commode, etc,.)?: A Little Help needed moving to and from a bed to chair (including a wheelchair)?: A Little Help needed walking in hospital room?: A Little Help needed climbing 3-5 steps with a railing? : Total 6 Click Score: 18    End of Session Equipment Utilized During Treatment: Gait belt Activity Tolerance: Patient limited by fatigue Patient left: in chair;with call bell/phone within reach;with chair alarm set Nurse Communication: Mobility status PT Visit Diagnosis: Unsteadiness on feet (R26.81);Muscle weakness (generalized) (M62.81)     Time: 4536-4680 PT Time Calculation (min) (ACUTE ONLY): 19 min  Charges:  $Gait Training: 8-22 mins                    G Codes:       Samadhi Mahurin,PT Acute Rehabilitation [phone] (pager)    Denice Paradise 01/06/2018, 10:26 AM

## 2018-01-06 NOTE — Progress Notes (Signed)
[place] TEAM 1 - Stepdown/ICU TEAM  SELDEN NOTEBOOM  XVQ:[id number] DOB: [birth year] DOA: 01/02/2018 PCP: [name], MD    Brief Narrative:  72yo Mretired [employer] w/ a Hx of Anemia, COPD, Chronic ischemic heart disease, pacemaker, OSA on CPAP, Afib, CAD, DM2, HTN, HLD, Nodule of kidney, Nontoxic multinodular goiter, Obesity, PUD, CKD who was admitted directly from his Cardiologist's office due to hypoxia, hypothermia, and hypoglycemia w/ CBG in the 83s.    Significant Events: 1/10 admit 1/11 TTE - poor quality   Subjective: The patient is resting comfortably in his bed.  He reports that his legs remain more swollen than usual.  He has a dry hacking cough but feels that he is just now beginning to produce some sputum.  He denies chest pain nausea or vomiting.  He is alert and oriented.  Assessment & Plan:  Acute Respiratory Failure with Hypoxia / Sepsis due to viral PNA (positive coronavirus)  Sepsis resolved -hemodynamically stabilizing - continue supportive care  Paroxysmal atrial fibrillation Sinus rhythm at present  Acute on CKD stage III (baseline Cr~1.83) Follow renal function with further diuresis Recent Labs  Lab 01/03/18 0943 01/04/18 0648 01/04/18 1332 01/05/18 0637 01/06/18 0416  CREATININE 2.20* 2.49* 2.28* 2.64* 2.44*    Acute on chronic diastolic CHF base KDTOIZ~124 kg -markedly volume overloaded -diurese and follow  Filed Weights   01/04/18 0406 01/05/18 0331 01/06/18 0600  Weight: 125.2 kg (276 lb 0.3 oz) 126.3 kg (278 lb 7.1 oz) 127.6 kg (281 lb 4.9 oz)   Essential HTN Blood pressure marginal at present and may prove to be limitation to further diuresis -follow closely  DM2 w/ hypoglycemia  prior to admission patient's PCP discontinued his Cogentin, Amaryl, Lantus secondary to hypoglycemia - 1/11 A1c 5.6 - CBGs now improved/elevated   HLD continue Zocor  DVT prophylaxis: eliquis Code Status: FULL CODE Family Communication:  no family present at time of exam  Disposition Plan: SDU   Consultants:  none  Antimicrobials:  Zosyn 1/10 Vancomycin 1/10 Azithromycin 1/11 > Ceftriaxone 1/11 >  Objective: Blood pressure 97/77, pulse 75, temperature 97.9 F (36.6 C), temperature source Oral, resp. rate 20, height 5' 11"  (1.803 m), weight 127.6 kg (281 lb 4.9 oz), SpO2 92 %.  Intake/Output Summary (Last 24 hours) at 01/06/2018 1704 Last data filed at 01/06/2018 0600 Gross per 24 hour  Intake 600 ml  Output 800 ml  Net -200 ml   Filed Weights   01/04/18 0406 01/05/18 0331 01/06/18 0600  Weight: 125.2 kg (276 lb 0.3 oz) 126.3 kg (278 lb 7.1 oz) 127.6 kg (281 lb 4.9 oz)    Examination: General: No acute respiratory distress Lungs: Poor air movement diffusely with bibasilar crackles with no wheezing Cardiovascular: Regular rate and rhythm without murmur -distant heart sounds Abdomen: Nontender, morbidly obese, soft, bowel sounds positive, no rebound, no ascites, no appreciable mass Extremities: 3+ pitting edema bilateral lower extremities  CBC: Recent Labs  Lab 01/02/18 1515 01/03/18 0827 01/04/18 0648 01/05/18 0637 01/06/18 0416  WBC 14.2* 9.5 9.8 17.5* 15.6*  NEUTROABS  --  7.2  --   --   --   HGB 13.2 10.4* 12.0* 11.7* 11.7*  HCT 40.3 33.9* 37.9* 36.8* 36.7*  MCV 93.5 93.4 94.5 93.4 92.2  PLT 194 142* 142* 206 580   Basic Metabolic Panel: Recent Labs  Lab 01/03/18 0943 01/04/18 0648 01/04/18 1332 01/05/18 0637 01/06/18 0416  NA 136 129* 125* 132* 129*  K 3.6  4.6 4.4 4.9 4.8  CL 98* 90* 90* 93* 91*  CO2 25 23 20* 26 24  GLUCOSE 104* 484* 499* 107* 221*  BUN 32* 44* 49* 57* 71*  CREATININE 2.20* 2.49* 2.28* 2.64* 2.44*  CALCIUM 8.2* 8.5* 8.4* 8.9 8.8*  MG 2.0 2.1 2.0 2.2 2.4   GFR: Estimated Creatinine Clearance: 37.2 mL/min (A) (by C-G formula based on SCr of 2.44 mg/dL (H)).  Liver Function Tests: Recent Labs  Lab 01/02/18 1515 01/03/18 0943  AST 51* 46*  ALT 29 26  ALKPHOS  170* 134*  BILITOT 1.1 1.3*  PROT 7.9 5.9*  ALBUMIN 3.8 3.0*   Recent Labs  Lab 01/02/18 1515  LIPASE 45   Coagulation Profile: Recent Labs  Lab 01/02/18 1515  INR 2.11    Cardiac Enzymes: Recent Labs  Lab 01/02/18 1515 01/03/18 0208 01/03/18 0827 01/03/18 1319  TROPONINI 0.03* 0.04* 0.03* 0.03*    HbA1C: Hgb A1c MFr Bld  Date/Time Value Ref Range Status  01/03/2018 08:27 AM 5.6 4.8 - 5.6 % Final    Comment:    (NOTE) Pre diabetes:          5.7%-6.4% Diabetes:              >6.4% Glycemic control for   <7.0% adults with diabetes     CBG: Recent Labs  Lab 01/05/18 1603 01/05/18 1923 01/05/18 2127 01/06/18 0810 01/06/18 1221  GLUCAP 177* 307* 269* 214* 170*    Recent Results (from the past 240 hour(s))  MRSA PCR Screening     Status: None   Collection Time: 01/02/18  3:18 PM  Result Value Ref Range Status   MRSA by PCR NEGATIVE NEGATIVE Final    Comment:        The GeneXpert MRSA Assay (FDA approved for NASAL specimens only), is one component of a comprehensive MRSA colonization surveillance program. It is not intended to diagnose MRSA infection nor to guide or monitor treatment for MRSA infections.   Blood Culture (routine x 2)     Status: None (Preliminary result)   Collection Time: 01/02/18  3:30 PM  Result Value Ref Range Status   Specimen Description BLOOD LEFT ANTECUBITAL  Final   Special Requests   Final    BOTTLES DRAWN AEROBIC AND ANAEROBIC Blood Culture results may not be optimal due to an excessive volume of blood received in culture bottles   Culture   Final    NO GROWTH 4 DAYS Performed at [facility], [place] [address], [place], [place] [address]    Report Status PENDING  Incomplete  Blood Culture (routine x 2)     Status: None (Preliminary result)   Collection Time: 01/02/18  3:45 PM  Result Value Ref Range Status   Specimen Description BLOOD RIGHT ANTECUBITAL  Final   Special Requests   Final    BOTTLES DRAWN AEROBIC  AND ANAEROBIC Blood Culture adequate volume   Culture   Final    NO GROWTH 4 DAYS Performed at [facility], [place] [address], [place], [place] [address]    Report Status PENDING  Incomplete  Urine culture     Status: Abnormal   Collection Time: 01/02/18  4:20 PM  Result Value Ref Range Status   Specimen Description URINE, RANDOM  Final   Special Requests NONE  Final   Culture (A)  Final    <10,000 COLONIES/mL INSIGNIFICANT GROWTH Performed at [facility], [place] [address], [place], [place] [address]  Report Status 01/04/2018 FINAL  Final  Respiratory Panel by PCR     Status: Abnormal   Collection Time: 01/03/18  1:09 PM  Result Value Ref Range Status   Adenovirus NOT DETECTED NOT DETECTED Final   Coronavirus 229E NOT DETECTED NOT DETECTED Final   Coronavirus HKU1 NOT DETECTED NOT DETECTED Final   Coronavirus NL63 NOT DETECTED NOT DETECTED Final   Coronavirus OC43 DETECTED (A) NOT DETECTED Final   Metapneumovirus NOT DETECTED NOT DETECTED Final   Rhinovirus / Enterovirus NOT DETECTED NOT DETECTED Final   Influenza A NOT DETECTED NOT DETECTED Final   Influenza B NOT DETECTED NOT DETECTED Final   Parainfluenza Virus 1 NOT DETECTED NOT DETECTED Final   Parainfluenza Virus 2 NOT DETECTED NOT DETECTED Final   Parainfluenza Virus 3 NOT DETECTED NOT DETECTED Final   Parainfluenza Virus 4 NOT DETECTED NOT DETECTED Final   Respiratory Syncytial Virus NOT DETECTED NOT DETECTED Final   Bordetella pertussis NOT DETECTED NOT DETECTED Final   Chlamydophila pneumoniae NOT DETECTED NOT DETECTED Final   Mycoplasma pneumoniae NOT DETECTED NOT DETECTED Final  Culture, blood (routine x 2) Call MD if unable to obtain prior to antibiotics being given     Status: None (Preliminary result)   Collection Time: 01/03/18  1:20 PM  Result Value Ref Range Status   Specimen Description BLOOD RIGHT ANTECUBITAL  Final   Special Requests IN PEDIATRIC BOTTLE Blood Culture adequate volume  Final    Culture NO GROWTH 3 DAYS  Final   Report Status PENDING  Incomplete  Culture, blood (routine x 2) Call MD if unable to obtain prior to antibiotics being given     Status: None (Preliminary result)   Collection Time: 01/03/18  1:26 PM  Result Value Ref Range Status   Specimen Description BLOOD BLOOD RIGHT HAND  Final   Special Requests IN PEDIATRIC BOTTLE Blood Culture adequate volume  Final   Culture NO GROWTH 3 DAYS  Final   Report Status PENDING  Incomplete     Scheduled Meds: . amiodarone  200 mg Oral Daily  . apixaban  5 mg Oral BID  . aspirin EC  81 mg Oral Daily  . chlorhexidine  15 mL Mouth Rinse BID  . dextromethorphan-guaiFENesin  1 tablet Oral BID  . furosemide  20 mg Intravenous Daily  . insulin aspart  0-20 Units Subcutaneous TID WC  . insulin aspart  0-5 Units Subcutaneous QHS  . insulin glargine  5 Units Subcutaneous Daily  . ipratropium-albuterol  3 mL Nebulization TID  . mouth rinse  15 mL Mouth Rinse q12n4p  . methylPREDNISolone (SOLU-MEDROL) injection  60 mg Intravenous Daily  . nebivolol  5 mg Oral Daily  . pantoprazole  40 mg Oral Daily  . simvastatin  20 mg Oral QHS  . sodium chloride flush  3 mL Intravenous Q12H     LOS: 4 days   Cherene Altes, MD [facility] Office  [phone] Pager - Text Page per Amion as per below:  On-Call/Text Page:      [email or web address]      password [email or web address]  If 7PM-7AM, please contact night-coverage [email or web address] Password [email or web address] 01/06/2018, 5:04 PM

## 2018-01-06 NOTE — Plan of Care (Signed)
Pt did well overnight with O2 on at 2 L/min pt states that he "feels much better and can't wait to start walking around the unit."

## 2018-01-07 LAB — MAGNESIUM: MAGNESIUM: 2.5 mg/dL — AB (ref 1.7–2.4)

## 2018-01-07 LAB — BASIC METABOLIC PANEL
Anion gap: 13 (ref 5–15)
BUN: 81 mg/dL — ABNORMAL HIGH (ref 6–20)
CHLORIDE: 94 mmol/L — AB (ref 101–111)
CO2: 24 mmol/L (ref 22–32)
Calcium: 9.1 mg/dL (ref 8.9–10.3)
Creatinine, Ser: 2.44 mg/dL — ABNORMAL HIGH (ref 0.61–1.24)
GFR, EST AFRICAN AMERICAN: 29 mL/min — AB (ref >60)
GFR, EST NON AFRICAN AMERICAN: 25 mL/min — AB (ref >60)
Glucose, Bld: 152 mg/dL — ABNORMAL HIGH (ref 65–99)
POTASSIUM: 5 mmol/L (ref 3.5–5.1)
SODIUM: 131 mmol/L — AB (ref 135–145)

## 2018-01-07 LAB — CULTURE, BLOOD (ROUTINE X 2)
CULTURE: NO GROWTH
Culture: NO GROWTH
Special Requests: ADEQUATE

## 2018-01-07 LAB — CBC
HEMATOCRIT: 36.2 % — AB (ref 39.0–52.0)
HEMOGLOBIN: 11.8 g/dL — AB (ref 13.0–17.0)
MCH: 29.8 pg (ref 26.0–34.0)
MCHC: 32.6 g/dL (ref 30.0–36.0)
MCV: 91.4 fL (ref 78.0–100.0)
Platelets: 261 10*3/uL (ref 150–400)
RBC: 3.96 MIL/uL — ABNORMAL LOW (ref 4.22–5.81)
RDW: 16 % — ABNORMAL HIGH (ref 11.5–15.5)
WBC: 16.5 10*3/uL — AB (ref 4.0–10.5)

## 2018-01-07 LAB — GLUCOSE, CAPILLARY
GLUCOSE-CAPILLARY: 199 mg/dL — AB (ref 65–99)
Glucose-Capillary: 167 mg/dL — ABNORMAL HIGH (ref 65–99)
Glucose-Capillary: 171 mg/dL — ABNORMAL HIGH (ref 65–99)
Glucose-Capillary: 218 mg/dL — ABNORMAL HIGH (ref 65–99)

## 2018-01-07 MED ORDER — ALBUTEROL SULFATE (2.5 MG/3ML) 0.083% IN NEBU
2.5000 mg | INHALATION_SOLUTION | RESPIRATORY_TRACT | Status: DC | PRN
Start: 2018-01-07 — End: 2018-01-10

## 2018-01-07 MED ORDER — FUROSEMIDE 10 MG/ML IJ SOLN
INTRAMUSCULAR | Status: AC
  Filled 2018-01-07: qty 4

## 2018-01-07 NOTE — Progress Notes (Addendum)
Patient placed on Home CPAP with 2L O2 RCP will continue to monitor, RN aware.

## 2018-01-07 NOTE — Progress Notes (Deleted)
SATURATION QUALIFICATIONS: (This note is used to comply with regulatory documentation for home oxygen)  Patient Saturations on Room Air at Rest = 96%  Patient Saturations on Room Air while Ambulating = 80%  Patient Saturations on 2 Liters of oxygen while Ambulating =92%  Please briefly explain why patient needs home oxygen:Patient unable to maintain oxygen saturation without oxygen.

## 2018-01-07 NOTE — Progress Notes (Signed)
[place] TEAM 1 - Stepdown/ICU TEAM  Lawrence [name]  MOQ:[id number] DOB: 09-28-1946 DOA: 01/02/2018 PCP: [name], MD    Brief Narrative:  72yo Mretired [employer] w/ a Hx of Anemia, COPD, Chronic ischemic heart disease, pacemaker, OSA on CPAP, Afib, CAD, DM2, HTN, HLD, Nodule of kidney, Nontoxic multinodular goiter, Obesity, PUD, and CKD who was admitted directly from his Cardiologist's office due to hypoxia, hypothermia, and hypoglycemia w/ CBG in the 26s.    Significant Events: 1/10 admit 1/11 TTE - poor quality   Subjective: The patient reports that he is beginning to expectorate significant amounts of sputum.  He also has noticed a significant increase in his urinary output.  He denies chest pain nausea or vomiting.  He did not sleep well last night.  Assessment & Plan:  Acute Respiratory Failure with Hypoxia / Sepsis due to viral PNA (positive coronavirus)  Sepsis resolved - hemodynamically stable - ready to transfer out of SDU  Paroxysmal atrial fibrillation Sinus rhythm at present  Acute on CKD stage III (baseline Cr~1.83) Follow renal function with further diuresis - crt appers to be holding steady at ~2.4 Recent Labs  Lab 01/04/18 0648 01/04/18 1332 01/05/18 0637 01/06/18 0416 01/07/18 0411  CREATININE 2.49* 2.28* 2.64* 2.44* 2.44*    Acute on chronic diastolic CHF Lowest recent weight on record ~115kg August 2018 - remains markedly volume overloaded - cont to diurese and follow  Filed Weights   01/05/18 0331 01/06/18 0600 01/07/18 0408  Weight: 126.3 kg (278 lb 7.1 oz) 127.6 kg (281 lb 4.9 oz) 127.5 kg (281 lb 1.4 oz)   Essential HTN Blood pressure stable today   DM2 w/ hypoglycemia  prior to admission patient's PCP discontinued his Cogentin, Amaryl, Lantus secondary to hypoglycemia - 1/11 A1c 5.6 - CBGs now trending upward - adjust tx and follow - hypoglycemia likely due to poor clearance of previously utilized DM meds in face of  worsening renal fxn   HLD continue Zocor  DVT prophylaxis: eliquis Code Status: FULL CODE Family Communication: no family present at time of exam  Disposition Plan: stable for transfer to tele bed - cont diuresis - PT/OT  Consultants:  none  Antimicrobials:  Zosyn 1/10 Vancomycin 1/10 Azithromycin 1/11 > Ceftriaxone 1/11 >  Objective: Blood pressure 116/65, pulse 72, temperature 97.9 F (36.6 C), temperature source Oral, resp. rate 13, height 5' 11"  (1.803 m), weight 127.5 kg (281 lb 1.4 oz), SpO2 95 %.  Intake/Output Summary (Last 24 hours) at 01/07/2018 1647 Last data filed at 01/07/2018 1519 Gross per 24 hour  Intake 832.83 ml  Output 1025 ml  Net -192.17 ml   Filed Weights   01/05/18 0331 01/06/18 0600 01/07/18 0408  Weight: 126.3 kg (278 lb 7.1 oz) 127.6 kg (281 lb 4.9 oz) 127.5 kg (281 lb 1.4 oz)    Examination: General: No acute respiratory distress at rest in bed  Lungs: Bibasilar crackles - no wheezing  Cardiovascular: Regular rate and rhythm without murmur or rub  Abdomen: Nontender, morbidly obese, soft, bowel sounds positive, no rebound Extremities: 3+ pitting edema bilateral lower extremities w/o signif change   CBC: Recent Labs  Lab 01/03/18 0827 01/04/18 0648 01/05/18 0637 01/06/18 0416 01/07/18 0411  WBC 9.5 9.8 17.5* 15.6* 16.5*  NEUTROABS 7.2  --   --   --   --   HGB 10.4* 12.0* 11.7* 11.7* 11.8*  HCT 33.9* 37.9* 36.8* 36.7* 36.2*  MCV 93.4 94.5 93.4 92.2 91.4  PLT 142* 142* 206 233 834   Basic Metabolic Panel: Recent Labs  Lab 01/04/18 0648 01/04/18 1332 01/05/18 0637 01/06/18 0416 01/07/18 0411  NA 129* 125* 132* 129* 131*  K 4.6 4.4 4.9 4.8 5.0  CL 90* 90* 93* 91* 94*  CO2 23 20* 26 24 24   GLUCOSE 484* 499* 107* 221* 152*  BUN 44* 49* 57* 71* 81*  CREATININE 2.49* 2.28* 2.64* 2.44* 2.44*  CALCIUM 8.5* 8.4* 8.9 8.8* 9.1  MG 2.1 2.0 2.2 2.4 2.5*   GFR: Estimated Creatinine Clearance: 37.2 mL/min (A) (by C-G formula based  on SCr of 2.44 mg/dL (H)).  Liver Function Tests: Recent Labs  Lab 01/02/18 1515 01/03/18 0943  AST 51* 46*  ALT 29 26  ALKPHOS 170* 134*  BILITOT 1.1 1.3*  PROT 7.9 5.9*  ALBUMIN 3.8 3.0*   Recent Labs  Lab 01/02/18 1515  LIPASE 45   Coagulation Profile: Recent Labs  Lab 01/02/18 1515  INR 2.11    Cardiac Enzymes: Recent Labs  Lab 01/02/18 1515 01/03/18 0208 01/03/18 0827 01/03/18 1319  TROPONINI 0.03* 0.04* 0.03* 0.03*    HbA1C: Hgb A1c MFr Bld  Date/Time Value Ref Range Status  01/03/2018 08:27 AM 5.6 4.8 - 5.6 % Final    Comment:    (NOTE) Pre diabetes:          5.7%-6.4% Diabetes:              >6.4% Glycemic control for   <7.0% adults with diabetes     CBG: Recent Labs  Lab 01/06/18 1221 01/06/18 1648 01/06/18 2018 01/07/18 0757 01/07/18 1149  GLUCAP 170* 262* 183* 167* 171*    Recent Results (from the past 240 hour(s))  MRSA PCR Screening     Status: None   Collection Time: 01/02/18  3:18 PM  Result Value Ref Range Status   MRSA by PCR NEGATIVE NEGATIVE Final    Comment:        The GeneXpert MRSA Assay (FDA approved for NASAL specimens only), is one component of a comprehensive MRSA colonization surveillance program. It is not intended to diagnose MRSA infection nor to guide or monitor treatment for MRSA infections.   Blood Culture (routine x 2)     Status: None   Collection Time: 01/02/18  3:30 PM  Result Value Ref Range Status   Specimen Description BLOOD LEFT ANTECUBITAL  Final   Special Requests   Final    BOTTLES DRAWN AEROBIC AND ANAEROBIC Blood Culture results may not be optimal due to an excessive volume of blood received in culture bottles   Culture   Final    NO GROWTH 5 DAYS Performed at [facility], [place] [address], [place], [place] [address]    Report Status 01/07/2018 FINAL  Final  Blood Culture (routine x 2)     Status: None   Collection Time: 01/02/18  3:45 PM  Result Value Ref Range Status    Specimen Description BLOOD RIGHT ANTECUBITAL  Final   Special Requests   Final    BOTTLES DRAWN AEROBIC AND ANAEROBIC Blood Culture adequate volume   Culture   Final    NO GROWTH 5 DAYS Performed at [facility], [place] [address], [place], [place] [address]    Report Status 01/07/2018 FINAL  Final  Urine culture     Status: Abnormal   Collection Time: 01/02/18  4:20 PM  Result Value Ref Range Status   Specimen Description URINE, RANDOM  Final  Special Requests NONE  Final   Culture (A)  Final    <10,000 COLONIES/mL INSIGNIFICANT GROWTH Performed at [facility], [place] [address] [place], [place] [address]    Report Status 01/04/2018 FINAL  Final  Respiratory Panel by PCR     Status: Abnormal   Collection Time: 01/03/18  1:09 PM  Result Value Ref Range Status   Adenovirus NOT DETECTED NOT DETECTED Final   Coronavirus 229E NOT DETECTED NOT DETECTED Final   Coronavirus HKU1 NOT DETECTED NOT DETECTED Final   Coronavirus NL63 NOT DETECTED NOT DETECTED Final   Coronavirus OC43 DETECTED (A) NOT DETECTED Final   Metapneumovirus NOT DETECTED NOT DETECTED Final   Rhinovirus / Enterovirus NOT DETECTED NOT DETECTED Final   Influenza A NOT DETECTED NOT DETECTED Final   Influenza B NOT DETECTED NOT DETECTED Final   Parainfluenza Virus 1 NOT DETECTED NOT DETECTED Final   Parainfluenza Virus 2 NOT DETECTED NOT DETECTED Final   Parainfluenza Virus 3 NOT DETECTED NOT DETECTED Final   Parainfluenza Virus 4 NOT DETECTED NOT DETECTED Final   Respiratory Syncytial Virus NOT DETECTED NOT DETECTED Final   Bordetella pertussis NOT DETECTED NOT DETECTED Final   Chlamydophila pneumoniae NOT DETECTED NOT DETECTED Final   Mycoplasma pneumoniae NOT DETECTED NOT DETECTED Final  Culture, blood (routine x 2) Call MD if unable to obtain prior to antibiotics being given     Status: None (Preliminary result)   Collection Time: 01/03/18  1:20 PM  Result Value Ref Range Status   Specimen Description  BLOOD RIGHT ANTECUBITAL  Final   Special Requests IN PEDIATRIC BOTTLE Blood Culture adequate volume  Final   Culture NO GROWTH 4 DAYS  Final   Report Status PENDING  Incomplete  Culture, blood (routine x 2) Call MD if unable to obtain prior to antibiotics being given     Status: None (Preliminary result)   Collection Time: 01/03/18  1:26 PM  Result Value Ref Range Status   Specimen Description BLOOD BLOOD RIGHT HAND  Final   Special Requests IN PEDIATRIC BOTTLE Blood Culture adequate volume  Final   Culture NO GROWTH 4 DAYS  Final   Report Status PENDING  Incomplete     Scheduled Meds: . amiodarone  200 mg Oral Daily  . apixaban  5 mg Oral BID  . aspirin EC  81 mg Oral Daily  . chlorhexidine  15 mL Mouth Rinse BID  . dextromethorphan-guaiFENesin  1 tablet Oral BID  . furosemide  60 mg Intravenous BID  . insulin aspart  0-20 Units Subcutaneous TID WC  . insulin aspart  0-5 Units Subcutaneous QHS  . insulin glargine  5 Units Subcutaneous Daily  . ipratropium-albuterol  3 mL Nebulization TID  . mouth rinse  15 mL Mouth Rinse q12n4p  . nebivolol  5 mg Oral Daily  . pantoprazole  40 mg Oral Daily  . simvastatin  20 mg Oral QHS     LOS: 5 days   Cherene Altes, MD [facility] Office  [phone] Pager - Text Page per Amion as per below:  On-Call/Text Page:      [email or web address]      password [email or web address]  If 7PM-7AM, please contact night-coverage [email or web address] Password [email or web address] 01/07/2018, 4:47 PM

## 2018-01-07 NOTE — Progress Notes (Signed)
Physical Therapy Treatment Patient Details Name: Lawrence [name] MRN: [id number] DOB: 1946-12-07 Today's Date: 01/07/2018    History of Present Illness 72 y.o.WM retired Manufacturing systems engineer PMHx Anemia, COPD, Chronic ischemic heart disease S/P pacemaker, unspecified, OSA on CPAP, CAD, Diabetes mellitus type II, with peripheral neuropathy, Essential HTN, HLD, Nodule of kidney, Nontoxic multinodular goiter, Obesity, Other testicular hypofunction, PUD, CKD.This past week, his sugars started bottoming out (glucose was 28 at home and 38 in cardiology office) and he is off all diabetic medications.  He has been having difficulty with his breathing the last month but worse in the last 2 weeks.  +cough, productive of yellow or clear phlegm the last few days.  Temp to 99.6 yesterday.  HR is usually in 65-70 range, in the 120s for the last few days.  +LE edema.  He was given Lasix and that worked really well today with good diuresis.     PT Comments    Pt admitted with above diagnosis. Pt currently with functional limitations due to balance and endurance deficits. Pt was able to ambulate on unit.  Moving with more steadiness today needing only 1 staff member.  1 seated rest break as pt continues to fatigue.  Pt will benefit from skilled PT to increase their independence and safety with mobility to allow discharge to the venue listed below.     Follow Up Recommendations  Home health PT;Supervision/Assistance - 24 hour     Equipment Recommendations  Other (comment)(TBA)    Recommendations for Other Services       Precautions / Restrictions Precautions Precautions: Fall Precaution Comments: DROPLET Restrictions Weight Bearing Restrictions: No    Mobility  Bed Mobility                  Transfers Overall transfer level: Needs assistance Equipment used: 4-wheeled walker Transfers: Sit to/from Stand Sit to Stand: +2 safety/equipment;Supervision         General transfer comment: vc for  safe hand placement with RW.   Ambulation/Gait Ambulation/Gait assistance: Min guard Ambulation Distance (Feet): 280 Feet(220 feet with seated rest then 60 feet) Assistive device: 4-wheeled walker Gait Pattern/deviations: Step-through pattern;Decreased stride length;Wide base of support   Gait velocity interpretation: Below normal speed for age/gender General Gait Details: Pt ambulated with good walker safety and was able to incr distance with 1 seated rest break due to feeling fatigue and lightheaded. Pt was on RA with sats 88-93%.     Stairs            Wheelchair Mobility    Modified Rankin (Stroke Patients Only)       Balance Overall balance assessment: Needs assistance         Standing balance support: During functional activity;Bilateral upper extremity supported Standing balance-Leahy Scale: Poor Standing balance comment: reliant on BUE support on RW                            Cognition Arousal/Alertness: Awake/alert Behavior During Therapy: WFL for tasks assessed/performed Overall Cognitive Status: Within Functional Limits for tasks assessed                                        Exercises General Exercises - Lower Extremity Ankle Circles/Pumps: AROM;Both;10 reps;Seated Long Arc Quad: AROM;Both;10 reps;Seated    General Comments  Pertinent Vitals/Pain Pain Assessment: No/denies pain  VSS  Home Living                      Prior Function            PT Goals (current goals can now be found in the care plan section) Acute Rehab PT Goals Patient Stated Goal: to get better Progress towards PT goals: Progressing toward goals    Frequency    Min 3X/week      PT Plan Current plan remains appropriate    Co-evaluation              AM-PAC PT "6 Clicks" Daily Activity  Outcome Measure  Difficulty turning over in bed (including adjusting bedclothes, sheets and blankets)?: None Difficulty moving  from lying on back to sitting on the side of the bed? : None Difficulty sitting down on and standing up from a chair with arms (e.g., wheelchair, bedside commode, etc,.)?: A Little Help needed moving to and from a bed to chair (including a wheelchair)?: A Little Help needed walking in hospital room?: A Little Help needed climbing 3-5 steps with a railing? : Total 6 Click Score: 18    End of Session Equipment Utilized During Treatment: Gait belt Activity Tolerance: Patient limited by fatigue Patient left: in chair;with call bell/phone within reach;with chair alarm set Nurse Communication: Mobility status PT Visit Diagnosis: Unsteadiness on feet (R26.81);Muscle weakness (generalized) (M62.81)     Time: 7062-3762 PT Time Calculation (min) (ACUTE ONLY): 18 min  Charges:  $Gait Training: 8-22 mins                    G Codes:       Deone Leifheit,PT Acute Rehabilitation [phone] (pager)    Denice Paradise 01/07/2018, 11:12 AM

## 2018-01-07 NOTE — Progress Notes (Signed)
Patient ambulated some in the room on on room air.  Sats continued to drop in the 80's , placed patient on 2L/[place] sats now up to 97%.

## 2018-01-08 LAB — GLUCOSE, CAPILLARY
Glucose-Capillary: 120 mg/dL — ABNORMAL HIGH (ref 65–99)
Glucose-Capillary: 136 mg/dL — ABNORMAL HIGH (ref 65–99)
Glucose-Capillary: 145 mg/dL — ABNORMAL HIGH (ref 65–99)
Glucose-Capillary: 238 mg/dL — ABNORMAL HIGH (ref 65–99)

## 2018-01-08 LAB — BASIC METABOLIC PANEL
Anion gap: 13 (ref 5–15)
BUN: 86 mg/dL — ABNORMAL HIGH (ref 6–20)
CALCIUM: 9.4 mg/dL (ref 8.9–10.3)
CO2: 25 mmol/L (ref 22–32)
CREATININE: 2.44 mg/dL — AB (ref 0.61–1.24)
Chloride: 95 mmol/L — ABNORMAL LOW (ref 101–111)
GFR, EST AFRICAN AMERICAN: 29 mL/min — AB (ref >60)
GFR, EST NON AFRICAN AMERICAN: 25 mL/min — AB (ref >60)
Glucose, Bld: 157 mg/dL — ABNORMAL HIGH (ref 65–99)
Potassium: 5.5 mmol/L — ABNORMAL HIGH (ref 3.5–5.1)
Sodium: 133 mmol/L — ABNORMAL LOW (ref 135–145)

## 2018-01-08 LAB — CULTURE, BLOOD (ROUTINE X 2)
CULTURE: NO GROWTH
Culture: NO GROWTH
SPECIAL REQUESTS: ADEQUATE
SPECIAL REQUESTS: ADEQUATE

## 2018-01-08 MED ORDER — METOLAZONE 5 MG PO TABS
5.0000 mg | ORAL_TABLET | Freq: Two times a day (BID) | ORAL | Status: DC
  Administered 2018-01-08 – 2018-01-13 (×10): 5 mg via ORAL
  Filled 2018-01-08 (×11): qty 1

## 2018-01-08 NOTE — Progress Notes (Signed)
PROGRESS NOTE    Lawrence [name]  QQV:[id number] DOB: 1946/03/12 DOA: 01/02/2018 PCP: [name], MD   Brief Narrative:  72 y.o. WM  retired Manufacturing systems engineer PMHx Anemia, COPD, Chronic ischemic heart disease S/P pacemaker, unspecified, OSA on CPAP, CAD, Diabetes mellitus type II, with peripheral neuropathy, Essential HTN, HLD, Nodule of kidney, Nontoxic multinodular goiter, Obesity, Other testicular hypofunction, PUD, CKD  Hospitalized in the [facility] and he ended up with afib which ended up not reversing.   He ended up with a pacemaker in 8/18.  This past week, his sugars started bottoming out (glucose was 28 at home and 38 in cardiology office) and he is off all diabetic medications.  He has been having difficulty with his breathing the last month but worse in the last 2 weeks.  +cough, productive of yellow or clear phlegm the last few days.  Temp to 99.6 yesterday.  HR is usually in 65-70 range, in the 120s for the last few days.  +LE edema.  He was given Lasix and that worked really well today with good diuresis.  Currently he doesn't think his breathing is all that bad right now.  Denies chest pain.   ED Course: Suspect acute hypoxic respiratory failure requiring non invasive mechanical ventilation BIPAP 2/2 to CAP complicated by acute on chronic heart failure. Wbc 14k, HR 118, RR 36. Meets SIRS criteria with possible pneumonia as source of infection.       Subjective: 1/16   A/O 4, negative CP, negative abdominal pain, negative N/V. Positive SOB improving. Positive cough (patient becoming fluid overloaded)   Assessment & Plan:   Principal Problem:   Acute on chronic diastolic CHF (congestive heart failure) (HCC) Active Problems:   Hyperlipidemia, mixed   Essential hypertension, benign   Paroxysmal atrial fibrillation (HCC)   Hypoglycemia  SIRS -Lactic acid elevated. Continue to trend -KVO normal saline. Becoming fluid overloaded -Complete 7 days course  antibiotics   Acute Respiratory Failure with Hypoxia/ viral CAP (positive coronavirus)  -Continue current antibiotic 7 days -DuoNeb QID -Solu-Medrol 60 mg daily -Flutter valve -Guaifenesin + codeine PRN cough -Mucinex DM BID  -PCXR: Consistent with pneumonia ? + interstitial edema. -Titrate O2 to maintain SPO2 89-93%  Paroxysmal atrial fibrillation? -See CHF  Acute on CKD stage III (baseline Cr~1.83) Recent Labs  Lab 01/04/18 0648 01/04/18 1332 01/05/18 0637 01/06/18 0416 01/07/18 0411 01/08/18 0433  CREATININE 2.49* 2.28* 2.64* 2.44* 2.44* 2.44*  -Most likely secondary to continued fluid overload hopefully will improve with increased diuresis. Monitor closely  Acute/chronic diastolic CHF (base PPIRJJ~884 kg/240 pound) -Strict in and out since admission +332m -Daily weight Filed Weights   01/05/18 0331 01/06/18 0600 01/07/18 0408  Weight: 278 lb 7.1 oz (126.3 kg) 281 lb 4.9 oz (127.6 kg) 281 lb 1.4 oz (127.5 kg)  -Amiodarone 200 mg daily -Lasix 60 mg BID -1/16 start Zaroxolyn 5 mg BID -Bystolic 5 mg daily -Continue to hold ACEI/ARB  Essential HTN -see CHF   Diabetes type 2 controlled with complication/Hyperglycemia -Patient having episodes of SEVERE HYPOGLYCEMIA. Prior to admission patient's PCP discontinued his Cogentin, Amaryl, Lantus secondary to hypoglycemia. Per patient not started ~ one week ago -1/11 Hemoglobin A1c = 5.6 -1/12 start insulin drip. Patient hyperglycemic secondary to steroids. Have also decreased steroid dose as we do not want patient to go into DKA -Lantus 5 units daily -resistant SSI  Hypoglycemia/ -See diabetes -Hypoglycemia secondary to CHF? Insulinoma? -resolved   HLD -Continue Zocor  DVT prophylaxis: Eliquis Code Status: Full Family Communication: none Disposition Plan: discharge 72-96 hours   Consultants:  None    Procedures/Significant Events:  1/11 Echocardiogram:- Left ventricle: Poor image quality with  foreshortened views even   with definity precludes accurate assessment of EF and wall   motion. Recommend cardiac MRI for further assessment. -moderate concentric hypertrophy. The   study was not technically sufficient to allow evaluation of LV   diastolic dysfunction due to atrial fibrillation. - Aortic valve: Poorly visualized. A bicuspid morphology cannot be   excluded; moderately thickened, moderately calcified leaflets. -- Tricuspid valve: There was mild-moderate regurgitation. 1/11 PCXR:-Bilateral interstitial edema superimposed on chronic interstitial lung disease, indicating active mild CHF. -Stable mild cardiomegaly.  -Pacemaker in place.     I have personally reviewed and interpreted all radiology studies and my findings are as above.  VENTILATOR SETTINGS:    Cultures 1/10 blood NGTD 1/10 urine insignificant growth 1/11 blood NGTD 1/11 respiratory virus panel positive coronavirus      Antimicrobials: Anti-infectives (From admission, onward)   Start     Stop   01/03/18 1630  vancomycin (VANCOCIN) 1,500 mg in sodium chloride 0.9 % 500 mL IVPB  Status:  Discontinued     01/03/18 0159   01/03/18 1400  cefTRIAXone (ROCEPHIN) 1 g in dextrose 5 % 50 mL IVPB     01/10/18 1359   01/03/18 1400  azithromycin (ZITHROMAX) 500 mg in dextrose 5 % 250 mL IVPB     01/10/18 1359   01/03/18 0000  piperacillin-tazobactam (ZOSYN) IVPB 3.375 g  Status:  Discontinued     01/03/18 0159   01/02/18 1615  piperacillin-tazobactam (ZOSYN) IVPB 3.375 g     01/02/18 1645   01/02/18 1615  vancomycin (VANCOCIN) IVPB 1000 mg/200 mL premix  Status:  Discontinued     01/02/18 1608   01/02/18 1615  vancomycin (VANCOCIN) IVPB 1000 mg/200 mL premix  Status:  Discontinued     01/03/18 0159   01/02/18 1615  vancomycin (VANCOCIN) IVPB 1000 mg/200 mL premix     01/02/18 1718       Devices    LINES / TUBES:      Continuous Infusions: . azithromycin Stopped (01/07/18 1608)  . cefTRIAXone  (ROCEPHIN)  IV Stopped (01/07/18 1457)     Objective: Vitals:   01/07/18 2315 01/08/18 0342 01/08/18 0748 01/08/18 0812  BP:    126/72  Pulse:    72  Resp: 15 13  15   Temp: 97.9 F (36.6 C) 97.9 F (36.6 C)  97.7 F (36.5 C)  TempSrc: Oral Oral  Oral  SpO2: 96% 96% 94% 92%  Weight:      Height:        Intake/Output Summary (Last 24 hours) at 01/08/2018 0840 Last data filed at 01/08/2018 0709 Gross per 24 hour  Intake 120 ml  Output 1800 ml  Net -1680 ml   Filed Weights   01/05/18 0331 01/06/18 0600 01/07/18 0408  Weight: 278 lb 7.1 oz (126.3 kg) 281 lb 4.9 oz (127.6 kg) 281 lb 1.4 oz (127.5 kg)    Physical Exam:  General: A/O 4, positive acute respiratory distress Neck:  Negative scars, masses, torticollis, lymphadenopathy, JVD Lungs: diffuse bilateral rhonchi, diffuse expiratory wheeze, negative crackles Cardiovascular: sinus tachycardia (paced) Regular rhythm without murmur gallop or rub normal S1 and S2 Abdomen: morbidly obese, negative abdominal pain, nondistended, positive soft, bowel sounds, no rebound, no ascites, no appreciable mass Extremities: No significant cyanosis, clubbing, or edema bilateral  lower extremities Skin: Negative rashes, lesions, ulcers Psychiatric:  Negative depression, negative anxiety, negative fatigue, negative mania  Central nervous system:  Cranial nerves II through XII intact, tongue/uvula midline, all extremities muscle strength 5/5, sensation intact throughout, negative dysarthria, negative expressive aphasia, negative receptive aphasia.   Data Reviewed: Care during the described time interval was provided by me .  I have reviewed this patient's available data, including medical history, events of note, physical examination, and all test results as part of my evaluation.   CBC: Recent Labs  Lab 01/03/18 0827 01/04/18 0648 01/05/18 0637 01/06/18 0416 01/07/18 0411  WBC 9.5 9.8 17.5* 15.6* 16.5*  NEUTROABS 7.2  --   --   --   --    HGB 10.4* 12.0* 11.7* 11.7* 11.8*  HCT 33.9* 37.9* 36.8* 36.7* 36.2*  MCV 93.4 94.5 93.4 92.2 91.4  PLT 142* 142* 206 233 937   Basic Metabolic Panel: Recent Labs  Lab 01/04/18 0648 01/04/18 1332 01/05/18 0637 01/06/18 0416 01/07/18 0411 01/08/18 0433  NA 129* 125* 132* 129* 131* 133*  K 4.6 4.4 4.9 4.8 5.0 5.5*  CL 90* 90* 93* 91* 94* 95*  CO2 23 20* 26 24 24 25   GLUCOSE 484* 499* 107* 221* 152* 157*  BUN 44* 49* 57* 71* 81* 86*  CREATININE 2.49* 2.28* 2.64* 2.44* 2.44* 2.44*  CALCIUM 8.5* 8.4* 8.9 8.8* 9.1 9.4  MG 2.1 2.0 2.2 2.4 2.5*  --    GFR: Estimated Creatinine Clearance: 37.2 mL/min (A) (by C-G formula based on SCr of 2.44 mg/dL (H)). Liver Function Tests: Recent Labs  Lab 01/02/18 1515 01/03/18 0943  AST 51* 46*  ALT 29 26  ALKPHOS 170* 134*  BILITOT 1.1 1.3*  PROT 7.9 5.9*  ALBUMIN 3.8 3.0*   Recent Labs  Lab 01/02/18 1515  LIPASE 45   No results for input(s): AMMONIA in the last 168 hours. Coagulation Profile: Recent Labs  Lab 01/02/18 1515  INR 2.11   Cardiac Enzymes: Recent Labs  Lab 01/02/18 1515 01/03/18 0208 01/03/18 0827 01/03/18 1319  TROPONINI 0.03* 0.04* 0.03* 0.03*   BNP (last 3 results) Recent Labs    09/17/17 1627 11/04/17 1112  PROBNP 6,365* 6,012*   HbA1C: No results for input(s): HGBA1C in the last 72 hours. CBG: Recent Labs  Lab 01/07/18 0757 01/07/18 1149 01/07/18 1622 01/07/18 2048 01/08/18 0816  GLUCAP 167* 171* 199* 218* 120*   Lipid Profile: No results for input(s): CHOL, HDL, LDLCALC, TRIG, CHOLHDL, LDLDIRECT in the last 72 hours. Thyroid Function Tests: No results for input(s): TSH, T4TOTAL, FREET4, T3FREE, THYROIDAB in the last 72 hours. Anemia Panel: No results for input(s): VITAMINB12, FOLATE, FERRITIN, TIBC, IRON, RETICCTPCT in the last 72 hours. Urine analysis:    Component Value Date/Time   COLORURINE YELLOW 01/02/2018 1620   APPEARANCEUR CLEAR 01/02/2018 1620   LABSPEC 1.025 01/02/2018  1620   PHURINE 5.5 01/02/2018 1620   GLUCOSEU NEGATIVE 01/02/2018 1620   HGBUR NEGATIVE 01/02/2018 1620   BILIRUBINUR NEGATIVE 01/02/2018 1620   KETONESUR NEGATIVE 01/02/2018 1620   PROTEINUR NEGATIVE 01/02/2018 1620   UROBILINOGEN 1.0 01/21/2008 1430   NITRITE NEGATIVE 01/02/2018 1620   LEUKOCYTESUR NEGATIVE 01/02/2018 1620   Sepsis Labs: @LABRCNTIP (procalcitonin:4,lacticidven:4)  ) Recent Results (from the past 240 hour(s))  MRSA PCR Screening     Status: None   Collection Time: 01/02/18  3:18 PM  Result Value Ref Range Status   MRSA by PCR NEGATIVE NEGATIVE Final    Comment:  The GeneXpert MRSA Assay (FDA approved for NASAL specimens only), is one component of a comprehensive MRSA colonization surveillance program. It is not intended to diagnose MRSA infection nor to guide or monitor treatment for MRSA infections.   Blood Culture (routine x 2)     Status: None   Collection Time: 01/02/18  3:30 PM  Result Value Ref Range Status   Specimen Description BLOOD LEFT ANTECUBITAL  Final   Special Requests   Final    BOTTLES DRAWN AEROBIC AND ANAEROBIC Blood Culture results may not be optimal due to an excessive volume of blood received in culture bottles   Culture   Final    NO GROWTH 5 DAYS Performed at [facility], [place] [address], [place], [place] [address]    Report Status 01/07/2018 FINAL  Final  Blood Culture (routine x 2)     Status: None   Collection Time: 01/02/18  3:45 PM  Result Value Ref Range Status   Specimen Description BLOOD RIGHT ANTECUBITAL  Final   Special Requests   Final    BOTTLES DRAWN AEROBIC AND ANAEROBIC Blood Culture adequate volume   Culture   Final    NO GROWTH 5 DAYS Performed at [facility], [place] [address], [place], [place] [address]    Report Status 01/07/2018 FINAL  Final  Urine culture     Status: Abnormal   Collection Time: 01/02/18  4:20 PM  Result Value Ref Range Status   Specimen Description URINE, RANDOM   Final   Special Requests NONE  Final   Culture (A)  Final    <10,000 COLONIES/mL INSIGNIFICANT GROWTH Performed at [facility], [place] [address], [place], [place] [address]    Report Status 01/04/2018 FINAL  Final  Respiratory Panel by PCR     Status: Abnormal   Collection Time: 01/03/18  1:09 PM  Result Value Ref Range Status   Adenovirus NOT DETECTED NOT DETECTED Final   Coronavirus 229E NOT DETECTED NOT DETECTED Final   Coronavirus HKU1 NOT DETECTED NOT DETECTED Final   Coronavirus NL63 NOT DETECTED NOT DETECTED Final   Coronavirus OC43 DETECTED (A) NOT DETECTED Final   Metapneumovirus NOT DETECTED NOT DETECTED Final   Rhinovirus / Enterovirus NOT DETECTED NOT DETECTED Final   Influenza A NOT DETECTED NOT DETECTED Final   Influenza B NOT DETECTED NOT DETECTED Final   Parainfluenza Virus 1 NOT DETECTED NOT DETECTED Final   Parainfluenza Virus 2 NOT DETECTED NOT DETECTED Final   Parainfluenza Virus 3 NOT DETECTED NOT DETECTED Final   Parainfluenza Virus 4 NOT DETECTED NOT DETECTED Final   Respiratory Syncytial Virus NOT DETECTED NOT DETECTED Final   Bordetella pertussis NOT DETECTED NOT DETECTED Final   Chlamydophila pneumoniae NOT DETECTED NOT DETECTED Final   Mycoplasma pneumoniae NOT DETECTED NOT DETECTED Final  Culture, blood (routine x 2) Call MD if unable to obtain prior to antibiotics being given     Status: None (Preliminary result)   Collection Time: 01/03/18  1:20 PM  Result Value Ref Range Status   Specimen Description BLOOD RIGHT ANTECUBITAL  Final   Special Requests IN PEDIATRIC BOTTLE Blood Culture adequate volume  Final   Culture NO GROWTH 4 DAYS  Final   Report Status PENDING  Incomplete  Culture, blood (routine x 2) Call MD if unable to obtain prior to antibiotics being given     Status: None (Preliminary result)   Collection Time: 01/03/18  1:26 PM  Result Value Ref  Range Status   Specimen Description BLOOD BLOOD RIGHT HAND  Final   Special Requests IN  PEDIATRIC BOTTLE Blood Culture adequate volume  Final   Culture NO GROWTH 4 DAYS  Final   Report Status PENDING  Incomplete         Radiology Studies: No results found.      Scheduled Meds: . amiodarone  200 mg Oral Daily  . apixaban  5 mg Oral BID  . aspirin EC  81 mg Oral Daily  . chlorhexidine  15 mL Mouth Rinse BID  . dextromethorphan-guaiFENesin  1 tablet Oral BID  . furosemide  60 mg Intravenous BID  . insulin aspart  0-20 Units Subcutaneous TID WC  . insulin aspart  0-5 Units Subcutaneous QHS  . insulin glargine  5 Units Subcutaneous Daily  . ipratropium-albuterol  3 mL Nebulization TID  . mouth rinse  15 mL Mouth Rinse q12n4p  . nebivolol  5 mg Oral Daily  . pantoprazole  40 mg Oral Daily  . simvastatin  20 mg Oral QHS   Continuous Infusions: . azithromycin Stopped (01/07/18 1608)  . cefTRIAXone (ROCEPHIN)  IV Stopped (01/07/18 1457)     LOS: 6 days    Time spent: 40 minutes    WOODS, Geraldo Docker, MD [facility] Pager [phone]   If 7PM-7AM, please contact night-coverage [email or web address] Password [email or web address] 01/08/2018, 8:40 AM

## 2018-01-08 NOTE — Plan of Care (Signed)
Patient receiving abx.

## 2018-01-08 NOTE — Progress Notes (Signed)
RT placed patient on Home unit CPAP. 2L O2 bleed in needed.

## 2018-01-08 NOTE — Care Management Important Message (Signed)
Important Message  Patient Details  Name: Lawrence [name] MRN: [id number] Date of Birth: [birth year]   Medicare Important Message Given:  Yes    Orbie Pyo 01/08/2018, 1:53 PM

## 2018-01-09 LAB — BASIC METABOLIC PANEL
Anion gap: 13 (ref 5–15)
BUN: 90 mg/dL — AB (ref 6–20)
CHLORIDE: 95 mmol/L — AB (ref 101–111)
CO2: 28 mmol/L (ref 22–32)
Calcium: 9.3 mg/dL (ref 8.9–10.3)
Creatinine, Ser: 2.35 mg/dL — ABNORMAL HIGH (ref 0.61–1.24)
GFR calc Af Amer: 30 mL/min — ABNORMAL LOW (ref >60)
GFR calc non Af Amer: 26 mL/min — ABNORMAL LOW (ref >60)
GLUCOSE: 104 mg/dL — AB (ref 65–99)
POTASSIUM: 4.4 mmol/L (ref 3.5–5.1)
SODIUM: 136 mmol/L (ref 135–145)

## 2018-01-09 LAB — CBC
HCT: 34.4 % — ABNORMAL LOW (ref 39.0–52.0)
HEMOGLOBIN: 10.9 g/dL — AB (ref 13.0–17.0)
MCH: 29.4 pg (ref 26.0–34.0)
MCHC: 31.7 g/dL (ref 30.0–36.0)
MCV: 92.7 fL (ref 78.0–100.0)
Platelets: 248 10*3/uL (ref 150–400)
RBC: 3.71 MIL/uL — AB (ref 4.22–5.81)
RDW: 16.4 % — ABNORMAL HIGH (ref 11.5–15.5)
WBC: 14.1 10*3/uL — ABNORMAL HIGH (ref 4.0–10.5)

## 2018-01-09 LAB — MAGNESIUM: MAGNESIUM: 2.4 mg/dL (ref 1.7–2.4)

## 2018-01-09 LAB — GLUCOSE, CAPILLARY
GLUCOSE-CAPILLARY: 194 mg/dL — AB (ref 65–99)
GLUCOSE-CAPILLARY: 149 mg/dL — AB (ref 65–99)
Glucose-Capillary: 132 mg/dL — ABNORMAL HIGH (ref 65–99)
Glucose-Capillary: 152 mg/dL — ABNORMAL HIGH (ref 65–99)

## 2018-01-09 MED ORDER — INSULIN GLARGINE 100 UNIT/ML ~~LOC~~ SOLN
8.0000 [IU] | Freq: Every day | SUBCUTANEOUS | Status: DC
  Administered 2018-01-10: 8 [IU] via SUBCUTANEOUS
  Filled 2018-01-09: qty 0.08

## 2018-01-09 NOTE — Progress Notes (Signed)
RT placed patient on HOME UNIT CPAP. 2L O2 bleed in needed. Patient tolerating well.

## 2018-01-09 NOTE — Progress Notes (Signed)
PROGRESS NOTE    Lawrence [name]  RAQ:[id number] DOB: [birth year] DOA: 01/02/2018 PCP: [name], MD   Brief Narrative:  72 y.o. WM  retired Manufacturing systems engineer PMHx Anemia, COPD, Chronic ischemic heart disease S/P pacemaker, unspecified, OSA on CPAP, CAD, Diabetes mellitus type II, with peripheral neuropathy, Essential HTN, HLD, Nodule of kidney, Nontoxic multinodular goiter, Obesity, Other testicular hypofunction, PUD, CKD  Hospitalized in the [name] and he ended up with afib which ended up not reversing.   He ended up with a pacemaker in 8/18.  This past week, his sugars started bottoming out (glucose was 28 at home and 38 in cardiology office) and he is off all diabetic medications.  He has been having difficulty with his breathing the last month but worse in the last 2 weeks.  +cough, productive of yellow or clear phlegm the last few days.  Temp to 99.6 yesterday.  HR is usually in 65-70 range, in the 120s for the last few days.  +LE edema.  He was given Lasix and that worked really well today with good diuresis.  Currently he doesn't think his breathing is all that bad right now.  Denies chest pain.   ED Course: Suspect acute hypoxic respiratory failure requiring non invasive mechanical ventilation BIPAP 2/2 to CAP complicated by acute on chronic heart failure. Wbc 14k, HR 118, RR 36. Meets SIRS criteria with possible pneumonia as source of infection.       Subjective: 1/17 A/O 4, negative CP, negative abdominal pain, negative N/V. Decreased cough, decreased fatigue    Assessment & Plan:   Principal Problem:   Acute on chronic diastolic CHF (congestive heart failure) (HCC) Active Problems:   Hyperlipidemia, mixed   Essential hypertension, benign   Paroxysmal atrial fibrillation (HCC)   Hypoglycemia  SIRS -Lactic acid elevated. Continue to trend -Fluid overload decreasing with diuresis -Complete seven-day course antibiotics   Acute Respiratory Failure with  Hypoxia/ viral CAP (positive coronavirus)  -Continue current antibiotic 7 days -DuoNeb TID -Flutter valve -Guaifenesin + codeine PRN cough -Mucinex DM BID  -PCXR: Consistent with pneumonia ? + interstitial edema. -Titrate O2 to maintain SPO2 89-93%  Paroxysmal atrial fibrillation? -See CHF  Acute on CKD stage III (baseline Cr~1.83) Recent Labs  Lab 01/04/18 1332 01/05/18 0637 01/06/18 0416 01/07/18 0411 01/08/18 0433 01/09/18 0943  CREATININE 2.28* 2.64* 2.44* 2.44* 2.44* 2.35*  -Most likely secondary to continued fluid overload; Now beginning to improve with diuresis   Acute/chronic diastolic CHF (base KTGYBW~389 kg/240 pound) -Strict in and out since admission -764m -Daily weight Filed Weights   01/06/18 0600 01/07/18 0408 01/08/18 2216  Weight: 281 lb 4.9 oz (127.6 kg) 281 lb 1.4 oz (127.5 kg) 274 lb 11.1 oz (124.6 kg)  -Amiodarone 200 mg daily -Lasix IV 60 mg BID: Continue aggressive diuresis (patient still fluid overload) -zaroxolyn 5 mg BID -Bystolic 5 mg daily -Hold all ACEI/ARB secondary renal failure  Essential HTN -see CHF   Diabetes type 2 controlled with complication/Hyperglycemia -Patient having episodes of SEVERE HYPOGLYCEMIA. Prior to admission patient's PCP discontinued his Cogentin, Amaryl, Lantus secondary to hypoglycemia. Per patient not started ~ one week ago -1/11 Hemoglobin A1c = 5.6 -1/12 start insulin drip. Patient hyperglycemic secondary to steroids. Have also decreased steroid dose as we do not want patient to go into DKA -1/17 increased Lantus 8 units daily -Resistant SSI  Hypoglycemia/ -See diabetes -Resolved     HLD -Zocor 20 mg daily    DVT prophylaxis: Eliquis  Code Status: Full Family Communication: none Disposition Plan: discharge 72-96 hours   Consultants:  None    Procedures/Significant Events:  1/11 Echocardiogram:- Left ventricle: Poor image quality with foreshortened views even   with definity precludes  accurate assessment of EF and wall   motion. Recommend cardiac MRI for further assessment. -moderate concentric hypertrophy. The   study was not technically sufficient to allow evaluation of LV   diastolic dysfunction due to atrial fibrillation. - Aortic valve: Poorly visualized. A bicuspid morphology cannot be   excluded; moderately thickened, moderately calcified leaflets. -- Tricuspid valve: There was mild-moderate regurgitation. 1/11 PCXR:-Bilateral interstitial edema superimposed on chronic interstitial lung disease, indicating active mild CHF. -Stable mild cardiomegaly.  -Pacemaker in place.     I have personally reviewed and interpreted all radiology studies and my findings are as above.  VENTILATOR SETTINGS:    Cultures 1/10 blood NGTD 1/10 urine insignificant growth 1/11 blood NGTD 1/11 respiratory virus panel positive coronavirus      Antimicrobials: Anti-infectives (From admission, onward)   Start     Stop   01/03/18 1630  vancomycin (VANCOCIN) 1,500 mg in sodium chloride 0.9 % 500 mL IVPB  Status:  Discontinued     01/03/18 0159   01/03/18 1400  cefTRIAXone (ROCEPHIN) 1 g in dextrose 5 % 50 mL IVPB     01/10/18 1359   01/03/18 1400  azithromycin (ZITHROMAX) 500 mg in dextrose 5 % 250 mL IVPB     01/10/18 1359   01/03/18 0000  piperacillin-tazobactam (ZOSYN) IVPB 3.375 g  Status:  Discontinued     01/03/18 0159   01/02/18 1615  piperacillin-tazobactam (ZOSYN) IVPB 3.375 g     01/02/18 1645   01/02/18 1615  vancomycin (VANCOCIN) IVPB 1000 mg/200 mL premix  Status:  Discontinued     01/02/18 1608   01/02/18 1615  vancomycin (VANCOCIN) IVPB 1000 mg/200 mL premix  Status:  Discontinued     01/03/18 0159   01/02/18 1615  vancomycin (VANCOCIN) IVPB 1000 mg/200 mL premix     01/02/18 1718       Devices    LINES / TUBES:      Continuous Infusions: . azithromycin Stopped (01/08/18 1558)  . cefTRIAXone (ROCEPHIN)  IV 1 g (01/09/18 1421)      Objective: Vitals:   01/09/18 0538 01/09/18 0813 01/09/18 1402 01/09/18 1426  BP: 121/65 116/62  116/60  Pulse: 69   76  Resp: 19   18  Temp: 97.8 F (36.6 C)   97.7 F (36.5 C)  TempSrc: Oral   Oral  SpO2: 98%  94% 95%  Weight:      Height:        Intake/Output Summary (Last 24 hours) at 01/09/2018 1441 Last data filed at 01/09/2018 1300 Gross per 24 hour  Intake 1200 ml  Output 2475 ml  Net -1275 ml   Filed Weights   01/06/18 0600 01/07/18 0408 01/08/18 2216  Weight: 281 lb 4.9 oz (127.6 kg) 281 lb 1.4 oz (127.5 kg) 274 lb 11.1 oz (124.6 kg)    Physical Exam:  General: A/O 4, positive acute respiratory distress Neck:  Negative scars, masses, torticollis, lymphadenopathy, JVD Lungs: diffuse expiratory wheeze, negative crackles  Cardiovascular: Regular rate (paced) and rhythm without murmur gallop or rub normal S1 and S2 Abdomen: Morbid obese, negative abdominal pain, nondistended, positive soft, bowel sounds, no rebound, no ascites, no appreciable mass Extremities: No significant cyanosis, clubbing. Bilateral lower extremity edema 3+ to hips, (improving)  Skin:  Negative rashes, lesions, ulcers Psychiatric:  Negative depression, negative anxiety, negative fatigue, negative mania  Central nervous system:  Cranial nerves II through XII intact, tongue/uvula midline, all extremities muscle strength 5/5, sensation intact throughout, negative dysarthria, negative expressive aphasia, negative receptive aphasia.     Data Reviewed: Care during the described time interval was provided by me .  I have reviewed this patient's available data, including medical history, events of note, physical examination, and all test results as part of my evaluation.   CBC: Recent Labs  Lab 01/03/18 0827 01/04/18 5638 01/05/18 0637 01/06/18 0416 01/07/18 0411 01/09/18 0943  WBC 9.5 9.8 17.5* 15.6* 16.5* 14.1*  NEUTROABS 7.2  --   --   --   --   --   HGB 10.4* 12.0* 11.7* 11.7* 11.8*  10.9*  HCT 33.9* 37.9* 36.8* 36.7* 36.2* 34.4*  MCV 93.4 94.5 93.4 92.2 91.4 92.7  PLT 142* 142* 206 233 261 937   Basic Metabolic Panel: Recent Labs  Lab 01/04/18 1332 01/05/18 0637 01/06/18 0416 01/07/18 0411 01/08/18 0433 01/09/18 0943  NA 125* 132* 129* 131* 133* 136  K 4.4 4.9 4.8 5.0 5.5* 4.4  CL 90* 93* 91* 94* 95* 95*  CO2 20* 26 24 24 25 28   GLUCOSE 499* 107* 221* 152* 157* 104*  BUN 49* 57* 71* 81* 86* 90*  CREATININE 2.28* 2.64* 2.44* 2.44* 2.44* 2.35*  CALCIUM 8.4* 8.9 8.8* 9.1 9.4 9.3  MG 2.0 2.2 2.4 2.5*  --  2.4   GFR: Estimated Creatinine Clearance: 38.2 mL/min (A) (by C-G formula based on SCr of 2.35 mg/dL (H)). Liver Function Tests: Recent Labs  Lab 01/02/18 1515 01/03/18 0943  AST 51* 46*  ALT 29 26  ALKPHOS 170* 134*  BILITOT 1.1 1.3*  PROT 7.9 5.9*  ALBUMIN 3.8 3.0*   Recent Labs  Lab 01/02/18 1515  LIPASE 45   No results for input(s): AMMONIA in the last 168 hours. Coagulation Profile: Recent Labs  Lab 01/02/18 1515  INR 2.11   Cardiac Enzymes: Recent Labs  Lab 01/02/18 1515 01/03/18 0208 01/03/18 0827 01/03/18 1319  TROPONINI 0.03* 0.04* 0.03* 0.03*   BNP (last 3 results) Recent Labs    09/17/17 1627 11/04/17 1112  PROBNP 6,365* 6,012*   HbA1C: No results for input(s): HGBA1C in the last 72 hours. CBG: Recent Labs  Lab 01/08/18 1142 01/08/18 1545 01/08/18 2224 01/09/18 0745 01/09/18 1154  GLUCAP 136* 145* 238* 132* 152*   Lipid Profile: No results for input(s): CHOL, HDL, LDLCALC, TRIG, CHOLHDL, LDLDIRECT in the last 72 hours. Thyroid Function Tests: No results for input(s): TSH, T4TOTAL, FREET4, T3FREE, THYROIDAB in the last 72 hours. Anemia Panel: No results for input(s): VITAMINB12, FOLATE, FERRITIN, TIBC, IRON, RETICCTPCT in the last 72 hours. Urine analysis:    Component Value Date/Time   COLORURINE YELLOW 01/02/2018 1620   APPEARANCEUR CLEAR 01/02/2018 1620   LABSPEC 1.025 01/02/2018 1620   PHURINE  5.5 01/02/2018 1620   GLUCOSEU NEGATIVE 01/02/2018 1620   HGBUR NEGATIVE 01/02/2018 1620   BILIRUBINUR NEGATIVE 01/02/2018 1620   KETONESUR NEGATIVE 01/02/2018 1620   PROTEINUR NEGATIVE 01/02/2018 1620   UROBILINOGEN 1.0 01/21/2008 1430   NITRITE NEGATIVE 01/02/2018 1620   LEUKOCYTESUR NEGATIVE 01/02/2018 1620   Sepsis Labs: @LABRCNTIP (procalcitonin:4,lacticidven:4)  ) Recent Results (from the past 240 hour(s))  MRSA PCR Screening     Status: None   Collection Time: 01/02/18  3:18 PM  Result Value Ref Range Status   MRSA by PCR NEGATIVE  NEGATIVE Final    Comment:        The GeneXpert MRSA Assay (FDA approved for NASAL specimens only), is one component of a comprehensive MRSA colonization surveillance program. It is not intended to diagnose MRSA infection nor to guide or monitor treatment for MRSA infections.   Blood Culture (routine x 2)     Status: None   Collection Time: 01/02/18  3:30 PM  Result Value Ref Range Status   Specimen Description BLOOD LEFT ANTECUBITAL  Final   Special Requests   Final    BOTTLES DRAWN AEROBIC AND ANAEROBIC Blood Culture results may not be optimal due to an excessive volume of blood received in culture bottles   Culture   Final    NO GROWTH 5 DAYS Performed at [facility], [place] [address] [place], [place] [address]    Report Status 01/07/2018 FINAL  Final  Blood Culture (routine x 2)     Status: None   Collection Time: 01/02/18  3:45 PM  Result Value Ref Range Status   Specimen Description BLOOD RIGHT ANTECUBITAL  Final   Special Requests   Final    BOTTLES DRAWN AEROBIC AND ANAEROBIC Blood Culture adequate volume   Culture   Final    NO GROWTH 5 DAYS Performed at [facility], [place] [address], [place], [place] [address]    Report Status 01/07/2018 FINAL  Final  Urine culture     Status: Abnormal   Collection Time: 01/02/18  4:20 PM  Result Value Ref Range Status   Specimen Description URINE, RANDOM  Final   Special  Requests NONE  Final   Culture (A)  Final    <10,000 COLONIES/mL INSIGNIFICANT GROWTH Performed at [facility], [place] [address], [place], [place] [address]    Report Status 01/04/2018 FINAL  Final  Respiratory Panel by PCR     Status: Abnormal   Collection Time: 01/03/18  1:09 PM  Result Value Ref Range Status   Adenovirus NOT DETECTED NOT DETECTED Final   Coronavirus 229E NOT DETECTED NOT DETECTED Final   Coronavirus HKU1 NOT DETECTED NOT DETECTED Final   Coronavirus NL63 NOT DETECTED NOT DETECTED Final   Coronavirus OC43 DETECTED (A) NOT DETECTED Final   Metapneumovirus NOT DETECTED NOT DETECTED Final   Rhinovirus / Enterovirus NOT DETECTED NOT DETECTED Final   Influenza A NOT DETECTED NOT DETECTED Final   Influenza B NOT DETECTED NOT DETECTED Final   Parainfluenza Virus 1 NOT DETECTED NOT DETECTED Final   Parainfluenza Virus 2 NOT DETECTED NOT DETECTED Final   Parainfluenza Virus 3 NOT DETECTED NOT DETECTED Final   Parainfluenza Virus 4 NOT DETECTED NOT DETECTED Final   Respiratory Syncytial Virus NOT DETECTED NOT DETECTED Final   Bordetella pertussis NOT DETECTED NOT DETECTED Final   Chlamydophila pneumoniae NOT DETECTED NOT DETECTED Final   Mycoplasma pneumoniae NOT DETECTED NOT DETECTED Final  Culture, blood (routine x 2) Call MD if unable to obtain prior to antibiotics being given     Status: None   Collection Time: 01/03/18  1:20 PM  Result Value Ref Range Status   Specimen Description BLOOD RIGHT ANTECUBITAL  Final   Special Requests IN PEDIATRIC BOTTLE Blood Culture adequate volume  Final   Culture NO GROWTH 5 DAYS  Final   Report Status 01/08/2018 FINAL  Final  Culture, blood (routine x 2) Call MD if unable to obtain prior to antibiotics being given     Status: None  Collection Time: 01/03/18  1:26 PM  Result Value Ref Range Status   Specimen Description BLOOD BLOOD RIGHT HAND  Final   Special Requests IN PEDIATRIC BOTTLE Blood Culture adequate volume  Final    Culture NO GROWTH 5 DAYS  Final   Report Status 01/08/2018 FINAL  Final         Radiology Studies: No results found.      Scheduled Meds: . amiodarone  200 mg Oral Daily  . apixaban  5 mg Oral BID  . aspirin EC  81 mg Oral Daily  . chlorhexidine  15 mL Mouth Rinse BID  . dextromethorphan-guaiFENesin  1 tablet Oral BID  . furosemide  60 mg Intravenous BID  . insulin aspart  0-20 Units Subcutaneous TID WC  . insulin aspart  0-5 Units Subcutaneous QHS  . insulin glargine  5 Units Subcutaneous Daily  . ipratropium-albuterol  3 mL Nebulization TID  . mouth rinse  15 mL Mouth Rinse q12n4p  . metolazone  5 mg Oral BID  . nebivolol  5 mg Oral Daily  . pantoprazole  40 mg Oral Daily  . simvastatin  20 mg Oral QHS   Continuous Infusions: . azithromycin Stopped (01/08/18 1558)  . cefTRIAXone (ROCEPHIN)  IV 1 g (01/09/18 1421)     LOS: 7 days    Time spent: 40 minutes    Terald Jump, Geraldo Docker, MD [facility] Pager [phone]   If 7PM-7AM, please contact night-coverage [email or web address] Password [email or web address] 01/09/2018, 2:41 PM

## 2018-01-09 NOTE — Progress Notes (Signed)
Physical Therapy Treatment Patient Details Name: Lawrence [name] MRN: [id number] DOB: [birth year] Today's Date: 01/09/2018    History of Present Illness 72 y.o.WM retired Manufacturing systems engineer PMHx Anemia, COPD, Chronic ischemic heart disease S/P pacemaker, unspecified, OSA on CPAP, CAD, Diabetes mellitus type II, with peripheral neuropathy, Essential HTN, HLD, Nodule of kidney, Nontoxic multinodular goiter, Obesity, Other testicular hypofunction, PUD, CKD.This past week, his sugars started bottoming out (glucose was 28 at home and 38 in cardiology office) and he is off all diabetic medications.  He has been having difficulty with his breathing the last month but worse in the last 2 weeks.  +cough, productive of yellow or clear phlegm the last few days.  Temp to 99.6 yesterday.  HR is usually in 65-70 range, in the 120s for the last few days.  +LE edema.  He was given Lasix and that worked really well today with good diuresis.     PT Comments    Progressing well towards goals. Increased ambulation tolerance, however, continues to be limited by fatigue and SOB. DOE at 3/4. Oxygen sats ranged from 85%-93% on 2L of oxygen this session. Would continue to benefit from acute PT to increase independence and safety with functional mobility.    Follow Up Recommendations  Home health PT;Supervision/Assistance - 24 hour     Equipment Recommendations  Other (comment)(TBA)    Recommendations for Other Services       Precautions / Restrictions Precautions Precautions: Fall;Other (comment) Precaution Comments: watch oxygen sats  Restrictions Weight Bearing Restrictions: No    Mobility  Bed Mobility Overal bed mobility: Independent                Transfers Overall transfer level: Needs assistance Equipment used: 4-wheeled walker Transfers: Sit to/from Stand Sit to Stand: Supervision         General transfer comment: Supervision for safety. Verbal cues for safe hand placement when  returning to sitting.   Ambulation/Gait Ambulation/Gait assistance: Min guard Ambulation Distance (Feet): 150 Feet(X2 with seated rest) Assistive device: 4-wheeled walker Gait Pattern/deviations: Step-through pattern;Decreased stride length;Wide base of support Gait velocity: Decreased  Gait velocity interpretation: Below normal speed for age/gender General Gait Details: Increased gait tolerace with good technique with use of rollator. Cues for pursed lip breathing. Required 1 seated rest secondary to fatigue. Oxygen sats from 85-93% on 2L of oxygen.    Stairs            Wheelchair Mobility    Modified Rankin (Stroke Patients Only)       Balance Overall balance assessment: Needs assistance Sitting-balance support: No upper extremity supported;Feet supported Sitting balance-Leahy Scale: Good     Standing balance support: During functional activity;Bilateral upper extremity supported Standing balance-Leahy Scale: Poor Standing balance comment: reliant on BUE support on Rollator                             Cognition Arousal/Alertness: Awake/alert Behavior During Therapy: WFL for tasks assessed/performed Overall Cognitive Status: Within Functional Limits for tasks assessed                                        Exercises      General Comments        Pertinent Vitals/Pain Pain Assessment: No/denies pain    Home Living  Prior Function            PT Goals (current goals can now be found in the care plan section) Acute Rehab PT Goals Patient Stated Goal: to get better PT Goal Formulation: With patient Time For Goal Achievement: 01/18/18 Potential to Achieve Goals: Good Progress towards PT goals: Progressing toward goals    Frequency    Min 3X/week      PT Plan Current plan remains appropriate    Co-evaluation              AM-PAC PT "6 Clicks" Daily Activity  Outcome Measure   Difficulty turning over in bed (including adjusting bedclothes, sheets and blankets)?: None Difficulty moving from lying on back to sitting on the side of the bed? : None Difficulty sitting down on and standing up from a chair with arms (e.g., wheelchair, bedside commode, etc,.)?: A Little Help needed moving to and from a bed to chair (including a wheelchair)?: A Little Help needed walking in hospital room?: A Little Help needed climbing 3-5 steps with a railing? : Total 6 Click Score: 18    End of Session Equipment Utilized During Treatment: Gait belt Activity Tolerance: Patient limited by fatigue Patient left: in bed;with call bell/phone within reach(sitting EOB; RN notified ) Nurse Communication: Mobility status PT Visit Diagnosis: Unsteadiness on feet (R26.81);Muscle weakness (generalized) (M62.81)     Time: 0071-2197 PT Time Calculation (min) (ACUTE ONLY): 23 min  Charges:  $Gait Training: 23-37 mins                    G Codes:       Leighton Ruff, PT, DPT  Acute Rehabilitation Services  Pager: [phone]    Rudean Hitt 01/09/2018, 6:45 PM

## 2018-01-10 LAB — GLUCOSE, CAPILLARY
GLUCOSE-CAPILLARY: 134 mg/dL — AB (ref 65–99)
GLUCOSE-CAPILLARY: 206 mg/dL — AB (ref 65–99)
GLUCOSE-CAPILLARY: 129 mg/dL — AB (ref 65–99)
Glucose-Capillary: 136 mg/dL — ABNORMAL HIGH (ref 65–99)

## 2018-01-10 MED ORDER — SENNOSIDES-DOCUSATE SODIUM 8.6-50 MG PO TABS
1.0000 | ORAL_TABLET | Freq: Two times a day (BID) | ORAL | Status: DC
  Administered 2018-01-10 – 2018-01-13 (×6): 1 via ORAL
  Filled 2018-01-10 (×6): qty 1

## 2018-01-10 MED ORDER — BISACODYL 10 MG RE SUPP: 10.0000 mg | Freq: Every day | RECTAL | Status: DC | PRN

## 2018-01-10 MED ORDER — MAGNESIUM CITRATE PO SOLN: 1.0000 | Freq: Once | ORAL | Status: DC | PRN

## 2018-01-10 MED ORDER — GLIMEPIRIDE 1 MG PO TABS
2.0000 mg | ORAL_TABLET | Freq: Every day | ORAL | Status: DC
  Administered 2018-01-11 – 2018-01-13 (×3): 2 mg via ORAL
  Filled 2018-01-10 (×3): qty 2

## 2018-01-10 MED ORDER — ALBUTEROL SULFATE (2.5 MG/3ML) 0.083% IN NEBU: 2.5000 mg | INHALATION_SOLUTION | RESPIRATORY_TRACT | Status: DC | PRN

## 2018-01-10 MED ORDER — POLYETHYLENE GLYCOL 3350 17 G PO PACK
17.0000 g | PACK | Freq: Every day | ORAL | Status: DC
  Administered 2018-01-10 – 2018-01-13 (×4): 17 g via ORAL
  Filled 2018-01-10 (×4): qty 1

## 2018-01-10 NOTE — Progress Notes (Signed)
Pt already wearing home CPAP upon RT's arrival. RT will continue to monitor.

## 2018-01-10 NOTE — Progress Notes (Signed)
[facility] TEAM 1 - Stepdown/ICU TEAM  CASY BRUNETTO  VPX:[id number] DOB: 07-03-1946 DOA: 01/02/2018 PCP: [name], MD    Brief Narrative:  72yo Mretired [employer] w/ a Hx of Anemia, COPD, Chronic ischemic heart disease, pacemaker, OSA on CPAP, Afib, CAD, DM2, HTN, HLD, Nodule of kidney, Nontoxic multinodular goiter, Obesity, PUD, and CKD who was admitted directly from his Cardiologist's office due to hypoxia, hypothermia, and hypoglycemia w/ CBG in the 10s.    Significant Events: 1/10 admit 1/11 TTE - poor quality   Subjective: The patient continues to cough up a significant amount of sputum.  He also reports that he has not had a bowel movement in multiple days.  He otherwise denies shortness of breath nausea vomiting or chest pain.  Assessment & Plan:  Acute Respiratory Failure with Hypoxia / Sepsis due to viral PNA (positive coronavirus)  Sepsis resolved - hemodynamically stable   Paroxysmal atrial fibrillation Sinus rhythm at present  Acute on CKD stage III (baseline Cr~1.83) Follow renal function with further diuresis - crt holding steady at ~2.4 Recent Labs  Lab 01/05/18 0637 01/06/18 0416 01/07/18 0411 01/08/18 0433 01/09/18 0943  CREATININE 2.64* 2.44* 2.44* 2.44* 2.35*    Acute on chronic diastolic CHF Lowest recent weight on record ~115kg August 2018 - much less volume overloaded w/ weight trending down nicely   [employer]   01/07/18 0408 01/08/18 2216 01/10/18 0500  Weight: 127.5 kg (281 lb 1.4 oz) 124.6 kg (274 lb 11.1 oz) 119.3 kg (263 lb 0.1 oz)   Essential HTN Blood pressure stable   DM2 w/ hypoglycemia  prior to admission patient's PCP discontinued his Cogentin, Amaryl, Lantus secondary to hypoglycemia - 1/11 A1c 5.6 -  hypoglycemia likely due to poor clearance of previously utilized DM meds in face of worsening renal fxn - CBG reasonable today - follow w/o change in tx   HLD continue Zocor  DVT prophylaxis: eliquis Code  Status: FULL CODE Family Communication: no family present at time of exam  Disposition Plan: probable d/c home in 48hrs   Consultants:  none  Antimicrobials:  Zosyn 1/10 Vancomycin 1/10 Azithromycin 1/11 > 1/17 Ceftriaxone 1/11 > 1/17  Objective: Blood pressure (!) 124/55, pulse 64, temperature 97.6 F (36.4 C), temperature source Oral, resp. rate 18, height 5' 11"  (1.803 m), weight 119.3 kg (263 lb 0.1 oz), SpO2 93 %.  Intake/Output Summary (Last 24 hours) at 01/10/2018 1701 Last data filed at 01/10/2018 1300 Gross per 24 hour  Intake 1080 ml  Output 2650 ml  Net -1570 ml   Filed Weights   01/07/18 0408 01/08/18 2216 01/10/18 0500  Weight: 127.5 kg (281 lb 1.4 oz) 124.6 kg (274 lb 11.1 oz) 119.3 kg (263 lb 0.1 oz)    Examination: General: No acute respiratory distress - frequent cough  Lungs: CTA th/o - no wheezing  Cardiovascular: RRR Abdomen: Nontender, morbidly obese, soft, bowel sounds positive, no rebound Extremities: 1+ pitting edema bilateral lower extremities   CBC: Recent Labs  Lab 01/04/18 0648 01/05/18 0637 01/06/18 0416 01/07/18 0411 01/09/18 0943  WBC 9.8 17.5* 15.6* 16.5* 14.1*  HGB 12.0* 11.7* 11.7* 11.8* 10.9*  HCT 37.9* 36.8* 36.7* 36.2* 34.4*  MCV 94.5 93.4 92.2 91.4 92.7  PLT 142* 206 233 261 462   Basic Metabolic Panel: Recent Labs  Lab 01/04/18 1332 01/05/18 0637 01/06/18 0416 01/07/18 0411 01/08/18 0433 01/09/18 0943  NA 125* 132* 129* 131* 133* 136  K 4.4 4.9 4.8 5.0  5.5* 4.4  CL 90* 93* 91* 94* 95* 95*  CO2 20* 26 24 24 25 28   GLUCOSE 499* 107* 221* 152* 157* 104*  BUN 49* 57* 71* 81* 86* 90*  CREATININE 2.28* 2.64* 2.44* 2.44* 2.44* 2.35*  CALCIUM 8.4* 8.9 8.8* 9.1 9.4 9.3  MG 2.0 2.2 2.4 2.5*  --  2.4   GFR: Estimated Creatinine Clearance: 37.3 mL/min (A) (by C-G formula based on SCr of 2.35 mg/dL (H)).  HbA1C: Hgb A1c MFr Bld  Date/Time Value Ref Range Status  01/03/2018 08:27 AM 5.6 4.8 - 5.6 % Final    Comment:     (NOTE) Pre diabetes:          5.7%-6.4% Diabetes:              >6.4% Glycemic control for   <7.0% adults with diabetes     CBG: Recent Labs  Lab 01/09/18 1637 01/09/18 2104 01/10/18 0738 01/10/18 1211 01/10/18 1642  GLUCAP 194* 149* 136* 134* 206*    Recent Results (from the past 240 hour(s))  MRSA PCR Screening     Status: None   Collection Time: 01/02/18  3:18 PM  Result Value Ref Range Status   MRSA by PCR NEGATIVE NEGATIVE Final    Comment:        The GeneXpert MRSA Assay (FDA approved for NASAL specimens only), is one component of a comprehensive MRSA colonization surveillance program. It is not intended to diagnose MRSA infection nor to guide or monitor treatment for MRSA infections.   Blood Culture (routine x 2)     Status: None   Collection Time: 01/02/18  3:30 PM  Result Value Ref Range Status   Specimen Description BLOOD LEFT ANTECUBITAL  Final   Special Requests   Final    BOTTLES DRAWN AEROBIC AND ANAEROBIC Blood Culture results may not be optimal due to an excessive volume of blood received in culture bottles   Culture   Final    NO GROWTH 5 DAYS Performed at [facility], [place] [address] [place], [place] [address]    Report Status 01/07/2018 FINAL  Final  Blood Culture (routine x 2)     Status: None   Collection Time: 01/02/18  3:45 PM  Result Value Ref Range Status   Specimen Description BLOOD RIGHT ANTECUBITAL  Final   Special Requests   Final    BOTTLES DRAWN AEROBIC AND ANAEROBIC Blood Culture adequate volume   Culture   Final    NO GROWTH 5 DAYS Performed at [facility], [place] [address] [place], [place] [address]    Report Status 01/07/2018 FINAL  Final  Urine culture     Status: Abnormal   Collection Time: 01/02/18  4:20 PM  Result Value Ref Range Status   Specimen Description URINE, RANDOM  Final   Special Requests NONE  Final   Culture (A)  Final    <10,000 COLONIES/mL INSIGNIFICANT GROWTH Performed at [facility], [place] [address] [place], [place] [address]    Report Status 01/04/2018 FINAL  Final  Respiratory Panel by PCR     Status: Abnormal   Collection Time: 01/03/18  1:09 PM  Result Value Ref Range Status   Adenovirus NOT DETECTED NOT DETECTED Final   Coronavirus 229E NOT DETECTED NOT DETECTED Final   Coronavirus HKU1 NOT DETECTED NOT DETECTED Final   Coronavirus NL63 NOT DETECTED NOT DETECTED Final   Coronavirus OC43 DETECTED (A) NOT DETECTED Final  Metapneumovirus NOT DETECTED NOT DETECTED Final   Rhinovirus / Enterovirus NOT DETECTED NOT DETECTED Final   Influenza A NOT DETECTED NOT DETECTED Final   Influenza B NOT DETECTED NOT DETECTED Final   Parainfluenza Virus 1 NOT DETECTED NOT DETECTED Final   Parainfluenza Virus 2 NOT DETECTED NOT DETECTED Final   Parainfluenza Virus 3 NOT DETECTED NOT DETECTED Final   Parainfluenza Virus 4 NOT DETECTED NOT DETECTED Final   Respiratory Syncytial Virus NOT DETECTED NOT DETECTED Final   Bordetella pertussis NOT DETECTED NOT DETECTED Final   Chlamydophila pneumoniae NOT DETECTED NOT DETECTED Final   Mycoplasma pneumoniae NOT DETECTED NOT DETECTED Final  Culture, blood (routine x 2) Call MD if unable to obtain prior to antibiotics being given     Status: None   Collection Time: 01/03/18  1:20 PM  Result Value Ref Range Status   Specimen Description BLOOD RIGHT ANTECUBITAL  Final   Special Requests IN PEDIATRIC BOTTLE Blood Culture adequate volume  Final   Culture NO GROWTH 5 DAYS  Final   Report Status 01/08/2018 FINAL  Final  Culture, blood (routine x 2) Call MD if unable to obtain prior to antibiotics being given     Status: None   Collection Time: 01/03/18  1:26 PM  Result Value Ref Range Status   Specimen Description BLOOD BLOOD RIGHT HAND  Final   Special Requests IN PEDIATRIC BOTTLE Blood Culture adequate volume  Final   Culture NO GROWTH 5 DAYS  Final   Report Status 01/08/2018 FINAL  Final     Scheduled Meds: . amiodarone   200 mg Oral Daily  . apixaban  5 mg Oral BID  . aspirin EC  81 mg Oral Daily  . chlorhexidine  15 mL Mouth Rinse BID  . dextromethorphan-guaiFENesin  1 tablet Oral BID  . furosemide  60 mg Intravenous BID  . insulin aspart  0-20 Units Subcutaneous TID WC  . insulin aspart  0-5 Units Subcutaneous QHS  . insulin glargine  8 Units Subcutaneous Daily  . ipratropium-albuterol  3 mL Nebulization TID  . mouth rinse  15 mL Mouth Rinse q12n4p  . metolazone  5 mg Oral BID  . nebivolol  5 mg Oral Daily  . pantoprazole  40 mg Oral Daily  . simvastatin  20 mg Oral QHS     LOS: 8 days   Cherene Altes, MD [facility] Office  [phone] Pager - Text Page per Amion as per below:  On-Call/Text Page:      [email or web address]      password [email or web address]  If 7PM-7AM, please contact night-coverage [email or web address] Password [email or web address] 01/10/2018, 5:01 PM

## 2018-01-10 NOTE — Progress Notes (Signed)
Occupational Therapy Treatment Patient Details Name: Lawrence [name] MRN: [id number] DOB: March 10, 1946 Today's Date: 01/10/2018    History of present illness 72 y.o.WM retired Manufacturing systems engineer PMHx Anemia, COPD, Chronic ischemic heart disease S/P pacemaker, unspecified, OSA on CPAP, CAD, Diabetes mellitus type II, with peripheral neuropathy, Essential HTN, HLD, Nodule of kidney, Nontoxic multinodular goiter, Obesity, Other testicular hypofunction, PUD, CKD.This past week, his sugars started bottoming out (glucose was 28 at home and 38 in cardiology office) and he is off all diabetic medications.  He has been having difficulty with his breathing the last month but worse in the last 2 weeks.  +cough, productive of yellow or clear phlegm the last few days.  Temp to 99.6 yesterday.  HR is usually in 65-70 range, in the 120s for the last few days.  +LE edema.  He was given Lasix and that worked really well today with good diuresis.    OT comments  Pt required supervision for toilet transfer and grooming task x2 in standing. Pt continues to be quick to fatigue; SpO2 88-91% on 2L with minimal activity; DOE 3/4. Pt able to recall 1 energy conservation strategy; educated on energy conservation strategies and pursed lip breathing. D/c plan remains appropriate. Will continue to follow acutely.   Follow Up Recommendations  Home health OT;Supervision/Assistance - 24 hour    Equipment Recommendations  3 in 1 bedside commode    Recommendations for Other Services      Precautions / Restrictions Precautions Precautions: Fall;Other (comment) Precaution Comments: watch oxygen sats  Restrictions Weight Bearing Restrictions: No       Mobility Bed Mobility Overal bed mobility: Independent                Transfers Overall transfer level: Needs assistance Equipment used: 4-wheeled walker Transfers: Sit to/from Stand Sit to Stand: Supervision         General transfer comment: for safety, pt  moves quickly and requires cues for safe hand placement    Balance Overall balance assessment: Needs assistance Sitting-balance support: Feet supported;No upper extremity supported Sitting balance-Leahy Scale: Good     Standing balance support: No upper extremity supported;During functional activity Standing balance-Leahy Scale: Fair                             ADL either performed or assessed with clinical judgement   ADL Overall ADL's : Needs assistance/impaired     Grooming: Supervision/safety;Standing;Oral care;Wash/dry hands Grooming Details (indicate cue type and reason): Fatigues with standing activity                 Toilet Transfer: Supervision/safety;Ambulation;Regular Toilet;RW           Functional mobility during ADLs: Supervision/safety;Rolling walker General ADL Comments: Pt able to recall 1 energy conservation strategy. Educated on other forms of energy conservation and pursed lip breathing. SpO2 88-91 on 2L with activity; DOE 3/4.     Vision       Perception     Praxis      Cognition Arousal/Alertness: Awake/alert Behavior During Therapy: WFL for tasks assessed/performed Overall Cognitive Status: Within Functional Limits for tasks assessed                                          Exercises     Shoulder Instructions  General Comments      Pertinent Vitals/ Pain       Pain Assessment: No/denies pain  Home Living                                          Prior Functioning/Environment              Frequency  Min 2X/week        Progress Toward Goals  OT Goals(current goals can now be found in the care plan section)  Progress towards OT goals: Progressing toward goals  Acute Rehab OT Goals Patient Stated Goal: to get better OT Goal Formulation: With patient  Plan Discharge plan remains appropriate    Co-evaluation                 AM-PAC PT "6 Clicks" Daily  Activity     Outcome Measure   Help from another person eating meals?: None Help from another person taking care of personal grooming?: A Little Help from another person toileting, which includes using toliet, bedpan, or urinal?: A Little Help from another person bathing (including washing, rinsing, drying)?: A Little Help from another person to put on and taking off regular upper body clothing?: None Help from another person to put on and taking off regular lower body clothing?: A Little 6 Click Score: 20    End of Session Equipment Utilized During Treatment: Rolling walker;Oxygen  OT Visit Diagnosis: Unsteadiness on feet (R26.81);Other abnormalities of gait and mobility (R26.89);Muscle weakness (generalized) (M62.81)   Activity Tolerance Patient tolerated treatment well   Patient Left in bed;with call bell/phone within reach   Nurse Communication          Time: 1610-9604 OT Time Calculation (min): 18 min  Charges: OT General Charges $OT Visit: 1 Visit OT Treatments $Self Care/Home Management : 8-22 mins  Brandonlee Navis A. Ulice Brilliant, M.S., OTR/L Pager: [place] 01/10/2018, 3:35 PM

## 2018-01-11 LAB — BASIC METABOLIC PANEL
Anion gap: 13 (ref 5–15)
BUN: 91 mg/dL — ABNORMAL HIGH (ref 6–20)
CALCIUM: 9.4 mg/dL (ref 8.9–10.3)
CHLORIDE: 90 mmol/L — AB (ref 101–111)
CO2: 31 mmol/L (ref 22–32)
Creatinine, Ser: 2.09 mg/dL — ABNORMAL HIGH (ref 0.61–1.24)
GFR, EST AFRICAN AMERICAN: 35 mL/min — AB (ref >60)
GFR, EST NON AFRICAN AMERICAN: 30 mL/min — AB (ref >60)
Glucose, Bld: 174 mg/dL — ABNORMAL HIGH (ref 65–99)
Potassium: 4.3 mmol/L (ref 3.5–5.1)
SODIUM: 134 mmol/L — AB (ref 135–145)

## 2018-01-11 LAB — GLUCOSE, CAPILLARY
GLUCOSE-CAPILLARY: 121 mg/dL — AB (ref 65–99)
GLUCOSE-CAPILLARY: 183 mg/dL — AB (ref 65–99)
Glucose-Capillary: 93 mg/dL (ref 65–99)
Glucose-Capillary: 146 mg/dL — ABNORMAL HIGH (ref 65–99)

## 2018-01-11 NOTE — Progress Notes (Signed)
[name]  ZHY:[id number] DOB: 1946-03-28 DOA: 01/02/2018 PCP: [name], MD    Brief Narrative:  72yo Mretired [employer] w/ a Hx of Anemia, COPD, Chronic ischemic heart disease, pacemaker, OSA on CPAP, Afib, CAD, DM2, HTN, HLD, Nodule of kidney, Nontoxic multinodular goiter, Obesity, PUD, and CKD who was admitted directly from his Cardiologist's office due to hypoxia, hypothermia, and hypoglycemia w/ CBG in the 81s.    Significant Events: 1/10 admit 1/11 TTE - poor quality   Subjective: The patient had a bowel movement last night.  He feels much better overall.  He feels significantly less short of breath.  He reports that he has not weighed this little in quite some time.  He denies chest pain shortness of breath nausea or vomiting.  He continues to urinate frequently and profusely.  Assessment & Plan:  Acute Respiratory Failure with Hypoxia / Sepsis due to viral PNA (positive coronavirus)  Sepsis resolved - hemodynamically stable - attempt to wean to RA  Paroxysmal atrial fibrillation Sinus rhythm at present  Acute on CKD stage III (baseline Cr~1.83) Follow renal function with further diuresis - crt steadily improving  Recent Labs  Lab 01/06/18 0416 01/07/18 0411 01/08/18 0433 01/09/18 0943 01/11/18 0817  CREATININE 2.44* 2.44* 2.44* 2.35* 2.09*    Acute on chronic diastolic CHF Lowest recent weight on record ~115kg August 2018 - much less volume overloaded w/ weight trending down nicely - cont to diurese as able   [employer]   01/08/18 2216 01/10/18 0500 01/11/18 2952  Weight: 124.6 kg (274 lb 11.1 oz) 119.3 kg (263 lb 0.1 oz) 117.4 kg (258 lb 12.8 oz)   Essential HTN Blood pressure stable   DM2 w/ hypoglycemia  prior to admission patient's PCP discontinued his Cogentin, Amaryl, Lantus secondary to hypoglycemia - 1/11 A1c 5.6 -  hypoglycemia likely due to poor clearance of previously utilized DM meds  in face of worsening renal fxn - CBG reasonable today - follow w/o change in tx   HLD continue Zocor  DVT prophylaxis: eliquis Code Status: FULL CODE Family Communication: no family present at time of exam  Disposition Plan: probable d/c home in 48hrs   Consultants:  none  Antimicrobials:  Zosyn 1/10 Vancomycin 1/10 Azithromycin 1/11 > 1/17 Ceftriaxone 1/11 > 1/17  Objective: Blood pressure (!) 119/57, pulse 83, temperature 98.4 F (36.9 C), temperature source Oral, resp. rate 18, height 5' 11"  (1.803 m), weight 117.4 kg (258 lb 12.8 oz), SpO2 93 %.  Intake/Output Summary (Last 24 hours) at 01/11/2018 1558 Last data filed at 01/11/2018 1304 Gross per 24 hour  Intake 590 ml  Output 2950 ml  Net -2360 ml   Filed Weights   01/08/18 2216 01/10/18 0500 01/11/18 0637  Weight: 124.6 kg (274 lb 11.1 oz) 119.3 kg (263 lb 0.1 oz) 117.4 kg (258 lb 12.8 oz)    Examination: General: No acute respiratory distress  Lungs: CTA th/o - no crackles or wheeze  Cardiovascular: RRR w/o M  Abdomen: Nontender, morbidly obese, soft, bowel sounds positive, no rebound Extremities: 1+ pitting edema bilateral lower extremities - no erythema   CBC: Recent Labs  Lab 01/05/18 0637 01/06/18 0416 01/07/18 0411 01/09/18 0943  WBC 17.5* 15.6* 16.5* 14.1*  HGB 11.7* 11.7* 11.8* 10.9*  HCT 36.8* 36.7* 36.2* 34.4*  MCV 93.4 92.2 91.4 92.7  PLT 206 233 261 841   Basic Metabolic Panel: Recent Labs  Lab 01/05/18  3734 01/06/18 0416 01/07/18 0411 01/08/18 0433 01/09/18 0943 01/11/18 0817  NA 132* 129* 131* 133* 136 134*  K 4.9 4.8 5.0 5.5* 4.4 4.3  CL 93* 91* 94* 95* 95* 90*  CO2 26 24 24 25 28 31   GLUCOSE 107* 221* 152* 157* 104* 174*  BUN 57* 71* 81* 86* 90* 91*  CREATININE 2.64* 2.44* 2.44* 2.44* 2.35* 2.09*  CALCIUM 8.9 8.8* 9.1 9.4 9.3 9.4  MG 2.2 2.4 2.5*  --  2.4  --    GFR: Estimated Creatinine Clearance: 41.6 mL/min (A) (by C-G formula based on SCr of 2.09 mg/dL  (H)).  HbA1C: Hgb A1c MFr Bld  Date/Time Value Ref Range Status  01/03/2018 08:27 AM 5.6 4.8 - 5.6 % Final    Comment:    (NOTE) Pre diabetes:          5.7%-6.4% Diabetes:              >6.4% Glycemic control for   <7.0% adults with diabetes     CBG: Recent Labs  Lab 01/10/18 1211 01/10/18 1642 01/10/18 2141 01/11/18 0720 01/11/18 1110  GLUCAP 134* 206* 129* 121* 183*    Recent Results (from the past 240 hour(s))  MRSA PCR Screening     Status: None   Collection Time: 01/02/18  3:18 PM  Result Value Ref Range Status   MRSA by PCR NEGATIVE NEGATIVE Final    Comment:        The GeneXpert MRSA Assay (FDA approved for NASAL specimens only), is one component of a comprehensive MRSA colonization surveillance program. It is not intended to diagnose MRSA infection nor to guide or monitor treatment for MRSA infections.   Blood Culture (routine x 2)     Status: None   Collection Time: 01/02/18  3:30 PM  Result Value Ref Range Status   Specimen Description BLOOD LEFT ANTECUBITAL  Final   Special Requests   Final    BOTTLES DRAWN AEROBIC AND ANAEROBIC Blood Culture results may not be optimal due to an excessive volume of blood received in culture bottles   Culture   Final    NO GROWTH 5 DAYS Performed at [facility], [place] [address], [place], [place] [address]    Report Status 01/07/2018 FINAL  Final  Blood Culture (routine x 2)     Status: None   Collection Time: 01/02/18  3:45 PM  Result Value Ref Range Status   Specimen Description BLOOD RIGHT ANTECUBITAL  Final   Special Requests   Final    BOTTLES DRAWN AEROBIC AND ANAEROBIC Blood Culture adequate volume   Culture   Final    NO GROWTH 5 DAYS Performed at [facility], [place] [address], [place], [place] [address]    Report Status 01/07/2018 FINAL  Final  Urine culture     Status: Abnormal   Collection Time: 01/02/18  4:20 PM  Result Value Ref Range Status   Specimen Description URINE, RANDOM  Final    Special Requests NONE  Final   Culture (A)  Final    <10,000 COLONIES/mL INSIGNIFICANT GROWTH Performed at [facility], [place] [address], [place],  [address]    Report Status 01/04/2018 FINAL  Final  Respiratory Panel by PCR     Status: Abnormal   Collection Time: 01/03/18  1:09 PM  Result Value Ref Range Status   Adenovirus NOT DETECTED NOT DETECTED Final   Coronavirus 229E NOT DETECTED NOT DETECTED Final  Coronavirus HKU1 NOT DETECTED NOT DETECTED Final   Coronavirus NL63 NOT DETECTED NOT DETECTED Final   Coronavirus OC43 DETECTED (A) NOT DETECTED Final   Metapneumovirus NOT DETECTED NOT DETECTED Final   Rhinovirus / Enterovirus NOT DETECTED NOT DETECTED Final   Influenza A NOT DETECTED NOT DETECTED Final   Influenza B NOT DETECTED NOT DETECTED Final   Parainfluenza Virus 1 NOT DETECTED NOT DETECTED Final   Parainfluenza Virus 2 NOT DETECTED NOT DETECTED Final   Parainfluenza Virus 3 NOT DETECTED NOT DETECTED Final   Parainfluenza Virus 4 NOT DETECTED NOT DETECTED Final   Respiratory Syncytial Virus NOT DETECTED NOT DETECTED Final   Bordetella pertussis NOT DETECTED NOT DETECTED Final   Chlamydophila pneumoniae NOT DETECTED NOT DETECTED Final   Mycoplasma pneumoniae NOT DETECTED NOT DETECTED Final  Culture, blood (routine x 2) Call MD if unable to obtain prior to antibiotics being given     Status: None   Collection Time: 01/03/18  1:20 PM  Result Value Ref Range Status   Specimen Description BLOOD RIGHT ANTECUBITAL  Final   Special Requests IN PEDIATRIC BOTTLE Blood Culture adequate volume  Final   Culture NO GROWTH 5 DAYS  Final   Report Status 01/08/2018 FINAL  Final  Culture, blood (routine x 2) Call MD if unable to obtain prior to antibiotics being given     Status: None   Collection Time: 01/03/18  1:26 PM  Result Value Ref Range Status   Specimen Description BLOOD BLOOD RIGHT HAND  Final   Special Requests IN PEDIATRIC BOTTLE Blood Culture adequate volume   Final   Culture NO GROWTH 5 DAYS  Final   Report Status 01/08/2018 FINAL  Final     Scheduled Meds: . amiodarone  200 mg Oral Daily  . apixaban  5 mg Oral BID  . aspirin EC  81 mg Oral Daily  . chlorhexidine  15 mL Mouth Rinse BID  . dextromethorphan-guaiFENesin  1 tablet Oral BID  . furosemide  60 mg Intravenous BID  . glimepiride  2 mg Oral Q breakfast  . insulin aspart  0-20 Units Subcutaneous TID WC  . insulin aspart  0-5 Units Subcutaneous QHS  . ipratropium-albuterol  3 mL Nebulization TID  . mouth rinse  15 mL Mouth Rinse q12n4p  . metolazone  5 mg Oral BID  . nebivolol  5 mg Oral Daily  . pantoprazole  40 mg Oral Daily  . polyethylene glycol  17 g Oral Daily  . senna-docusate  1 tablet Oral BID  . simvastatin  20 mg Oral QHS     LOS: 9 days   Cherene Altes, MD [facility] Office  [phone] Pager - Text Page per Amion as per below:  On-Call/Text Page:      [email or web address]      password [email or web address]  If 7PM-7AM, please contact night-coverage [email or web address] Password [facility] 01/11/2018, 3:58 PM

## 2018-01-12 LAB — BASIC METABOLIC PANEL
Anion gap: 17 — ABNORMAL HIGH (ref 5–15)
BUN: 91 mg/dL — ABNORMAL HIGH (ref 6–20)
CHLORIDE: 89 mmol/L — AB (ref 101–111)
CO2: 31 mmol/L (ref 22–32)
CREATININE: 2.05 mg/dL — AB (ref 0.61–1.24)
Calcium: 9.5 mg/dL (ref 8.9–10.3)
GFR calc non Af Amer: 31 mL/min — ABNORMAL LOW (ref >60)
GFR, EST AFRICAN AMERICAN: 36 mL/min — AB (ref >60)
Glucose, Bld: 108 mg/dL — ABNORMAL HIGH (ref 65–99)
POTASSIUM: 4.1 mmol/L (ref 3.5–5.1)
Sodium: 137 mmol/L (ref 135–145)

## 2018-01-12 LAB — GLUCOSE, CAPILLARY
GLUCOSE-CAPILLARY: 154 mg/dL — AB (ref 65–99)
GLUCOSE-CAPILLARY: 231 mg/dL — AB (ref 65–99)
Glucose-Capillary: 118 mg/dL — ABNORMAL HIGH (ref 65–99)
Glucose-Capillary: 104 mg/dL — ABNORMAL HIGH (ref 65–99)

## 2018-01-12 MED ORDER — DM-GUAIFENESIN ER 30-600 MG PO TB12: 1.0000 | ORAL_TABLET | Freq: Two times a day (BID) | ORAL | Status: DC | PRN

## 2018-01-12 NOTE — Progress Notes (Addendum)
Pt has home CPAP at bedside. RT added sterile water to chamber per pt's request. Pt able to place himself on. 2L O2 bled in. RT will continue to monitor.

## 2018-01-12 NOTE — Progress Notes (Signed)
[facility] TEAM 1 - Stepdown/ICU TEAM  Lawrence [name]  OLM:[id number] DOB: [birth year] DOA: 01/02/2018 PCP: [name], MD    Brief Narrative:  72yo Mretired [employer] w/ a Hx of Anemia, COPD, Chronic ischemic heart disease, pacemaker, OSA on CPAP, Afib, CAD, DM2, HTN, HLD, Nodule of kidney, Nontoxic multinodular goiter, Obesity, PUD, and CKD who was admitted directly from his Cardiologist's office due to hypoxia, hypothermia, and hypoglycemia w/ CBG in the 43s.    Significant Events: 1/10 admit 1/11 TTE - poor quality   Subjective: The patient states he continues to feel better with each passing day.  Last night he was noted to desaturate into the low 80s while using CPAP while asleep.  His saturations have been consistently 88% or greater during the day while awake.  He denies chest pain nausea vomiting or abdominal pain.  Assessment & Plan:  Acute Respiratory Failure with Hypoxia / Sepsis due to viral PNA (positive coronavirus)  Sepsis resolved - hemodynamically stable - weaned to RA during day - check sats w/ ambulation - will def need O2 bleed in w/ CPAP QHS  Paroxysmal atrial fibrillation Sinus rhythm at present  Acute on CKD stage III (baseline Cr~1.83) Follow renal function with further diuresis - crt steadily improving w/ nadir appearing to be ~2.0 Recent Labs  Lab 01/07/18 0411 01/08/18 0433 01/09/18 0943 01/11/18 0817 01/12/18 0612  CREATININE 2.44* 2.44* 2.35* 2.09* 2.05*    Acute on chronic diastolic CHF Lowest recent weight on record ~115kg August 2018 - much less volume overloaded w/ weight trending down nicely - cont to diurese as able - suspect EDW will be ~115kg - discussed/educated on wgt tracking at home w/ use of prn extra diuretic and fluid restrictino   Filed Weights   01/10/18 0500 01/11/18 0637 01/12/18 0549  Weight: 119.3 kg (263 lb 0.1 oz) 117.4 kg (258 lb 12.8 oz) 115.1 kg (253 lb 11.2 oz)   Essential HTN Blood pressure stable     DM2 w/ hypoglycemia  prior to admission patient's PCP discontinued his Cogentin, Amaryl, Lantus secondary to hypoglycemia - 1/11 A1c 5.6 -  hypoglycemia likely due to poor clearance of previously utilized DM meds in face of worsening renal fxn - CBG rin goal range today w/ use of low dose amaryl   HLD continue Zocor  DVT prophylaxis: eliquis Code Status: FULL CODE Family Communication: Spoke with wife and patient at bedside at length Disposition Plan: probable d/c home 1/21  Consultants:  none  Antimicrobials:  Zosyn 1/10 Vancomycin 1/10 Azithromycin 1/11 > 1/17 Ceftriaxone 1/11 > 1/17  Objective: Blood pressure 121/71, pulse 70, temperature 98.2 F (36.8 C), temperature source Oral, resp. rate 19, height 5' 11"  (1.803 m), weight 115.1 kg (253 lb 11.2 oz), SpO2 94 %.  Intake/Output Summary (Last 24 hours) at 01/12/2018 1500 Last data filed at 01/12/2018 1408 Gross per 24 hour  Intake 490 ml  Output 2695 ml  Net -2205 ml   Filed Weights   01/10/18 0500 01/11/18 0637 01/12/18 0549  Weight: 119.3 kg (263 lb 0.1 oz) 117.4 kg (258 lb 12.8 oz) 115.1 kg (253 lb 11.2 oz)    Examination: General: No acute respiratory distress -alert and pleasant Lungs: CTA th/o without wheezing or crackles Cardiovascular: RRR -no murmur or rub Abdomen: Nontender, morbidly obese, soft, bowel sounds positive, no rebound Extremities: Trace edema bilateral lower extremities- no erythema   CBC: Recent Labs  Lab 01/06/18 0416 01/07/18 0411 01/09/18 0943  WBC  15.6* 16.5* 14.1*  HGB 11.7* 11.8* 10.9*  HCT 36.7* 36.2* 34.4*  MCV 92.2 91.4 92.7  PLT 233 261 784   Basic Metabolic Panel: Recent Labs  Lab 01/06/18 0416 01/07/18 0411 01/08/18 0433 01/09/18 0943 01/11/18 0817 01/12/18 0612  NA 129* 131* 133* 136 134* 137  K 4.8 5.0 5.5* 4.4 4.3 4.1  CL 91* 94* 95* 95* 90* 89*  CO2 24 24 25 28 31 31   GLUCOSE 221* 152* 157* 104* 174* 108*  BUN 71* 81* 86* 90* 91* 91*  CREATININE 2.44*  2.44* 2.44* 2.35* 2.09* 2.05*  CALCIUM 8.8* 9.1 9.4 9.3 9.4 9.5  MG 2.4 2.5*  --  2.4  --   --    GFR: Estimated Creatinine Clearance: 42 mL/min (A) (by C-G formula based on SCr of 2.05 mg/dL (H)).  HbA1C: Hgb A1c MFr Bld  Date/Time Value Ref Range Status  01/03/2018 08:27 AM 5.6 4.8 - 5.6 % Final    Comment:    (NOTE) Pre diabetes:          5.7%-6.4% Diabetes:              >6.4% Glycemic control for   <7.0% adults with diabetes     CBG: Recent Labs  Lab 01/11/18 1110 01/11/18 1634 01/11/18 2212 01/12/18 0727 01/12/18 1212  GLUCAP 183* 93 146* 118* 154*    Recent Results (from the past 240 hour(s))  MRSA PCR Screening     Status: None   Collection Time: 01/02/18  3:18 PM  Result Value Ref Range Status   MRSA by PCR NEGATIVE NEGATIVE Final    Comment:        The GeneXpert MRSA Assay (FDA approved for NASAL specimens only), is one component of a comprehensive MRSA colonization surveillance program. It is not intended to diagnose MRSA infection nor to guide or monitor treatment for MRSA infections.   Blood Culture (routine x 2)     Status: None   Collection Time: 01/02/18  3:30 PM  Result Value Ref Range Status   Specimen Description BLOOD LEFT ANTECUBITAL  Final   Special Requests   Final    BOTTLES DRAWN AEROBIC AND ANAEROBIC Blood Culture results may not be optimal due to an excessive volume of blood received in culture bottles   Culture   Final    NO GROWTH 5 DAYS Performed at [facility], [place] [address] [place], [place] [address]    Report Status 01/07/2018 FINAL  Final  Blood Culture (routine x 2)     Status: None   Collection Time: 01/02/18  3:45 PM  Result Value Ref Range Status   Specimen Description BLOOD RIGHT ANTECUBITAL  Final   Special Requests   Final    BOTTLES DRAWN AEROBIC AND ANAEROBIC Blood Culture adequate volume   Culture   Final    NO GROWTH 5 DAYS Performed at [facility], [place] [address], [place], [place]  [address]    Report Status 01/07/2018 FINAL  Final  Urine culture     Status: Abnormal   Collection Time: 01/02/18  4:20 PM  Result Value Ref Range Status   Specimen Description URINE, RANDOM  Final   Special Requests NONE  Final   Culture (A)  Final    <10,000 COLONIES/mL INSIGNIFICANT GROWTH Performed at [facility], [place] [address], [place], [place] [address]    Report Status 01/04/2018 FINAL  Final  Respiratory Panel by PCR  Status: Abnormal   Collection Time: 01/03/18  1:09 PM  Result Value Ref Range Status   Adenovirus NOT DETECTED NOT DETECTED Final   Coronavirus 229E NOT DETECTED NOT DETECTED Final   Coronavirus HKU1 NOT DETECTED NOT DETECTED Final   Coronavirus NL63 NOT DETECTED NOT DETECTED Final   Coronavirus OC43 DETECTED (A) NOT DETECTED Final   Metapneumovirus NOT DETECTED NOT DETECTED Final   Rhinovirus / Enterovirus NOT DETECTED NOT DETECTED Final   Influenza A NOT DETECTED NOT DETECTED Final   Influenza B NOT DETECTED NOT DETECTED Final   Parainfluenza Virus 1 NOT DETECTED NOT DETECTED Final   Parainfluenza Virus 2 NOT DETECTED NOT DETECTED Final   Parainfluenza Virus 3 NOT DETECTED NOT DETECTED Final   Parainfluenza Virus 4 NOT DETECTED NOT DETECTED Final   Respiratory Syncytial Virus NOT DETECTED NOT DETECTED Final   Bordetella pertussis NOT DETECTED NOT DETECTED Final   Chlamydophila pneumoniae NOT DETECTED NOT DETECTED Final   Mycoplasma pneumoniae NOT DETECTED NOT DETECTED Final  Culture, blood (routine x 2) Call MD if unable to obtain prior to antibiotics being given     Status: None   Collection Time: 01/03/18  1:20 PM  Result Value Ref Range Status   Specimen Description BLOOD RIGHT ANTECUBITAL  Final   Special Requests IN PEDIATRIC BOTTLE Blood Culture adequate volume  Final   Culture NO GROWTH 5 DAYS  Final   Report Status 01/08/2018 FINAL  Final  Culture, blood (routine x 2) Call MD if unable to obtain prior to antibiotics being given      Status: None   Collection Time: 01/03/18  1:26 PM  Result Value Ref Range Status   Specimen Description BLOOD BLOOD RIGHT HAND  Final   Special Requests IN PEDIATRIC BOTTLE Blood Culture adequate volume  Final   Culture NO GROWTH 5 DAYS  Final   Report Status 01/08/2018 FINAL  Final     Scheduled Meds: . amiodarone  200 mg Oral Daily  . apixaban  5 mg Oral BID  . aspirin EC  81 mg Oral Daily  . chlorhexidine  15 mL Mouth Rinse BID  . dextromethorphan-guaiFENesin  1 tablet Oral BID  . furosemide  60 mg Intravenous BID  . glimepiride  2 mg Oral Q breakfast  . insulin aspart  0-20 Units Subcutaneous TID WC  . insulin aspart  0-5 Units Subcutaneous QHS  . ipratropium-albuterol  3 mL Nebulization TID  . mouth rinse  15 mL Mouth Rinse q12n4p  . metolazone  5 mg Oral BID  . nebivolol  5 mg Oral Daily  . pantoprazole  40 mg Oral Daily  . polyethylene glycol  17 g Oral Daily  . senna-docusate  1 tablet Oral BID  . simvastatin  20 mg Oral QHS     LOS: 10 days   Cherene Altes, MD [facility] Office  [phone] Pager - Text Page per Amion as per below:  On-Call/Text Page:      [email or web address]      password [email or web address]  If 7PM-7AM, please contact night-coverage [email or web address] Password [email or web address] 01/12/2018, 3:00 PM

## 2018-01-12 NOTE — Progress Notes (Signed)
Patient lowest desat tonight were at 52 to 62 w/o O2.   Throughout the night patient has sustained levels in the 90s but has had episodes in the 80s.  I will keep monitoring patient.

## 2018-01-13 ENCOUNTER — Encounter (INDEPENDENT_AMBULATORY_CARE_PROVIDER_SITE_OTHER): Payer: Self-pay

## 2018-01-13 LAB — CBC
HEMATOCRIT: 28.2 % — AB (ref 39.0–52.0)
HEMOGLOBIN: 8.9 g/dL — AB (ref 13.0–17.0)
MCH: 29.1 pg (ref 26.0–34.0)
MCHC: 31.6 g/dL (ref 30.0–36.0)
MCV: 92.2 fL (ref 78.0–100.0)
Platelets: 198 10*3/uL (ref 150–400)
RBC: 3.06 MIL/uL — ABNORMAL LOW (ref 4.22–5.81)
RDW: 17 % — ABNORMAL HIGH (ref 11.5–15.5)
WBC: 12.8 10*3/uL — ABNORMAL HIGH (ref 4.0–10.5)

## 2018-01-13 LAB — BASIC METABOLIC PANEL
ANION GAP: 12 (ref 5–15)
BUN: 89 mg/dL — ABNORMAL HIGH (ref 6–20)
CHLORIDE: 90 mmol/L — AB (ref 101–111)
CO2: 36 mmol/L — ABNORMAL HIGH (ref 22–32)
Calcium: 9.3 mg/dL (ref 8.9–10.3)
Creatinine, Ser: 2.02 mg/dL — ABNORMAL HIGH (ref 0.61–1.24)
GFR calc Af Amer: 36 mL/min — ABNORMAL LOW (ref >60)
GFR, EST NON AFRICAN AMERICAN: 31 mL/min — AB (ref >60)
GLUCOSE: 108 mg/dL — AB (ref 65–99)
POTASSIUM: 4.2 mmol/L (ref 3.5–5.1)
Sodium: 138 mmol/L (ref 135–145)

## 2018-01-13 LAB — GLUCOSE, CAPILLARY
GLUCOSE-CAPILLARY: 183 mg/dL — AB (ref 65–99)
Glucose-Capillary: 112 mg/dL — ABNORMAL HIGH (ref 65–99)

## 2018-01-13 MED ORDER — METOLAZONE 5 MG PO TABS: 5.0000 mg | ORAL_TABLET | Freq: Two times a day (BID) | ORAL | 0 refills | Status: DC

## 2018-01-13 MED ORDER — GLIMEPIRIDE 2 MG PO TABS: 2.0000 mg | ORAL_TABLET | Freq: Every day | ORAL | 0 refills | Status: DC

## 2018-01-13 NOTE — Progress Notes (Signed)
Physical Therapy Treatment Patient Details Name: Lawrence [name] MRN: [id number] DOB: [birth year] Today's Date: 01/13/2018    History of Present Illness 71 y.o.WM retired Manufacturing systems engineer PMHx Anemia, COPD, Chronic ischemic heart disease S/P pacemaker, unspecified, OSA on CPAP, CAD, Diabetes mellitus type II, with peripheral neuropathy, Essential HTN, HLD, Nodule of kidney, Nontoxic multinodular goiter, Obesity, Other testicular hypofunction, PUD, CKD.This past week, his sugars started bottoming out (glucose was 28 at home and 38 in cardiology office) and he is off all diabetic medications.  He has been having difficulty with his breathing the last month but worse in the last 2 weeks.  +cough, productive of yellow or clear phlegm the last few days.  Temp to 99.6 yesterday.  HR is usually in 65-70 range, in the 120s for the last few days.  +LE edema.  He was given Lasix and that worked really well today with good diuresis.     PT Comments    Pt has improved to the point of not needing portable oxygen at minimal exertion in a home-like environment.  With use of his Rollator, sats on RA 89/90% with EHR 80's.    Follow Up Recommendations  Home health PT;Supervision/Assistance - 24 hour     Equipment Recommendations  Other (comment)    Recommendations for Other Services       Precautions / Restrictions Precautions Precautions: Fall(minimal risk) Precaution Comments: watch oxygen sats  Restrictions Weight Bearing Restrictions: No    Mobility  Bed Mobility               General bed mobility comments: up already  Transfers Overall transfer level: Needs assistance Equipment used: None Transfers: Sit to/from Stand Sit to Stand: Supervision         General transfer comment: safety minded and no assist needed  Ambulation/Gait Ambulation/Gait assistance: Supervision Ambulation Distance (Feet): 200 Feet Assistive device: 4-wheeled walker Gait Pattern/deviations:  Step-through pattern Gait velocity: Decreased  Gait velocity interpretation: Below normal speed for age/gender General Gait Details: pt steady with rollator.  Stops to rest as appropriate.  Sats maintained 89/90%  except for 84% with poor wave pattern   Stairs Stairs: Yes   Stair Management: One rail Right;Alternating pattern;Forwards Number of Stairs: 5 General stair comments: safe with rail  Wheelchair Mobility    Modified Rankin (Stroke Patients Only)       Balance Overall balance assessment: Needs assistance         Standing balance support: No upper extremity supported;During functional activity Standing balance-Leahy Scale: Fair Standing balance comment: reliant on BUE support on Rollator                             Cognition Arousal/Alertness: Awake/alert Behavior During Therapy: WFL for tasks assessed/performed Overall Cognitive Status: Within Functional Limits for tasks assessed                                        Exercises      General Comments        Pertinent Vitals/Pain Pain Assessment: No/denies pain    Home Living                      Prior Function            PT Goals (current goals can now be found in the  care plan section) Acute Rehab PT Goals Patient Stated Goal: to get better PT Goal Formulation: With patient Time For Goal Achievement: 01/18/18 Potential to Achieve Goals: Good Progress towards PT goals: Progressing toward goals    Frequency    Min 3X/week      PT Plan Current plan remains appropriate    Co-evaluation              AM-PAC PT "6 Clicks" Daily Activity  Outcome Measure  Difficulty turning over in bed (including adjusting bedclothes, sheets and blankets)?: None Difficulty moving from lying on back to sitting on the side of the bed? : None Difficulty sitting down on and standing up from a chair with arms (e.g., wheelchair, bedside commode, etc,.)?: A Little Help  needed moving to and from a bed to chair (including a wheelchair)?: A Little Help needed walking in hospital room?: A Little Help needed climbing 3-5 steps with a railing? : A Little 6 Click Score: 20    End of Session   Activity Tolerance: Patient limited by fatigue Patient left: with call bell/phone within reach;with family/visitor present(with OT) Nurse Communication: Mobility status PT Visit Diagnosis: Unsteadiness on feet (R26.81);Muscle weakness (generalized) (M62.81)     Time: 6067-7034 PT Time Calculation (min) (ACUTE ONLY): 11 min  Charges:  $Gait Training: 8-22 mins                    G Codes:       02/04/2018  Donnella Sham, PT [phone]  (pager)   Tessie Fass Peggi Yono February 04, 2018, 3:20 PM

## 2018-01-13 NOTE — Care Management Note (Signed)
Case Management Note  Patient Details  Name: Lawrence [name] MRN: [id number] Date of Birth: June 22, 1946  Subjective/Objective:   Pt presented for Acute Hypoxic Respiratory Failure. PTA from home with wife. Plan will be for home with [facility].  Agency List provided and pt/ wife chose [facility].                 Action/Plan: Referral made to [facility] and [facility] to begin within 24-48 hours post d/c. No further needs from CM at this time.   Expected Discharge Date:  01/06/18               Expected Discharge Plan:  [place]  In-House Referral:  NA  Discharge planning Services  CM Consult  Post Acute Care Choice:  Home Health Choice offered to:  Patient, Spouse  DME Arranged:  N/A DME Agency:  NA  HH Arranged:  PT HH Agency:  [facility]  Status of Service:  Completed, signed off  If discussed at [place] of Stay Meetings, dates discussed:    Additional Comments:  Bethena Roys, RN 01/13/2018, 3:16 PM

## 2018-01-13 NOTE — Progress Notes (Signed)
Occupational Therapy Treatment Patient Details Name: Lawrence [name] MRN: [id number] DOB: 1946-05-18 Today's Date: 01/13/2018    History of present illness 72 y.o.WM retired Manufacturing systems engineer PMHx Anemia, COPD, Chronic ischemic heart disease S/P pacemaker, unspecified, OSA on CPAP, CAD, Diabetes mellitus type II, with peripheral neuropathy, Essential HTN, HLD, Nodule of kidney, Nontoxic multinodular goiter, Obesity, Other testicular hypofunction, PUD, CKD.This past week, his sugars started bottoming out (glucose was 28 at home and 38 in cardiology office) and he is off all diabetic medications.  He has been having difficulty with his breathing the last month but worse in the last 2 weeks.  +cough, productive of yellow or clear phlegm the last few days.  Temp to 99.6 yesterday.  HR is usually in 65-70 range, in the 120s for the last few days.  +LE edema.  He was given Lasix and that worked really well today with good diuresis.    OT comments  Pt has made excellent progress.  He is now able to perform ADLs with supervision.  DOE 1/4 - 2/4 with activity.  No LOB noted when bending forward to retrieve items from floor.  Wife is very supportive.  He is eager to discharge.   Follow Up Recommendations  Supervision/Assistance - 24 hour;No OT follow up    Equipment Recommendations  None recommended by OT    Recommendations for Other Services      Precautions / Restrictions Precautions Precautions: Fall Precaution Comments: watch oxygen sats  Restrictions Weight Bearing Restrictions: No       Mobility Bed Mobility               General bed mobility comments: up already  Transfers Overall transfer level: Needs assistance Equipment used: None Transfers: Sit to/from Stand;Stand Pivot Transfers Sit to Stand: Supervision Stand pivot transfers: Supervision       General transfer comment: safety minded and no assist needed    Balance Overall balance assessment: Needs assistance          Standing balance support: No upper extremity supported;During functional activity Standing balance-Leahy Scale: Good Standing balance comment: reliant on BUE support on Rollator                            ADL either performed or assessed with clinical judgement   ADL Overall ADL's : Needs assistance/impaired Eating/Feeding: Sitting;Independent   Grooming: Wash/dry hands;Oral care;Wash/dry face;Brushing hair;Supervision/safety;Standing   Upper Body Bathing: Set up;Sitting   Lower Body Bathing: Supervison/ safety;Sit to/from stand   Upper Body Dressing : Set up;Sitting   Lower Body Dressing: Supervision/safety;Sit to/from stand   Toilet Transfer: Supervision/safety;Ambulation;Comfort height toilet   Toileting- Clothing Manipulation and Hygiene: Supervision/safety;Sit to/from stand       Functional mobility during ADLs: Supervision/safety;Rolling walker General ADL Comments: Pt reports feeling significantly better.  Pt able to stand x 9 mins with supervision and DOE 1/4.  This is functional standing tolerance, and this was after working with PT.  He is able to retrieve items from floor with supervision.  Reinforced need to pace self.  Wife present and agreeable.  Pt reports he tub bathes using a garden tub and feels confident he can do this.       Vision       Perception     Praxis      Cognition Arousal/Alertness: Awake/alert Behavior During Therapy: WFL for tasks assessed/performed Overall Cognitive Status: Within Functional Limits for tasks assessed  Exercises     Shoulder Instructions       General Comments      Pertinent Vitals/ Pain       Pain Assessment: No/denies pain  Home Living                                          Prior Functioning/Environment              Frequency  Min 2X/week        Progress Toward Goals  OT Goals(current goals can  now be found in the care plan section)  Progress towards OT goals: Progressing toward goals  Acute Rehab OT Goals Patient Stated Goal: to get better  Plan Discharge plan needs to be updated    Co-evaluation                 AM-PAC PT "6 Clicks" Daily Activity     Outcome Measure   Help from another person eating meals?: None Help from another person taking care of personal grooming?: A Little Help from another person toileting, which includes using toliet, bedpan, or urinal?: A Little Help from another person bathing (including washing, rinsing, drying)?: A Little Help from another person to put on and taking off regular upper body clothing?: A Little Help from another person to put on and taking off regular lower body clothing?: A Little 6 Click Score: 19    End of Session    OT Visit Diagnosis: Unsteadiness on feet (R26.81);Other abnormalities of gait and mobility (R26.89);Muscle weakness (generalized) (M62.81)   Activity Tolerance Patient tolerated treatment well   Patient Left in bed;with call bell/phone within reach;with family/visitor present(sitting EOB with wife present )   Nurse Communication          Time: 4481-8563 OT Time Calculation (min): 21 min  Charges: OT General Charges $OT Visit: 1 Visit OT Treatments $Self Care/Home Management : 8-22 mins  [facility], OTR/L [phone]    [name] 01/13/2018, 6:17 PM

## 2018-01-13 NOTE — Discharge Summary (Signed)
DISCHARGE SUMMARY  Lawrence [name]  MR#: [id number]  DOB:1946-06-07  Date of Admission: 01/02/2018 Date of Discharge: 01/13/2018  Attending Physician:Kenzy Campoverde Hennie Duos, MD   Patient's TGP:[id number], Lawrence Blonder, MD  Consults:  none  Disposition: D/C home   Follow-up Appts: [facility].   Specialty:  Home Health Services Why:  Physical Therapy Contact information: [address] [place] [address] [phone]        Mutiple established outpatient providers Follow up.   Why:  Keep your scheduled appointments as we discussed today in your room.           Tests Needing Follow-up: -assess weight / volume status and possible need to adjust weight goal/diuretic dose   Discharge Diagnoses: Acute Respiratory Failure with Hypoxia Sepsis due to viral PNA (positivecoronavirus)  Paroxysmal atrial fibrillation Acute on CKD stage III (baseline Cr~1.83) Acute on chronic diastolic CHF Essential HTN DM2 w/ hypoglycemia  HLD  Initial presentation: 72yo Mretired [employer] w/ a Hxof Anemia, COPD, Chronic ischemic heart disease, pacemaker, OSA on CPAP, Afib, CAD, DM2, HTN, HLD, Nodule of kidney, Nontoxic multinodular goiter, Obesity, PUD, and CKD who was admitted directly from his Cardiologist's office due to hypoxia, hypothermia, and hypoglycemia w/ CBG in the 30s.    Hospital Course:  Acute Respiratory Failure with Hypoxia / Sepsis due to viral PNA (positivecoronavirus)  Sepsis resolved - hemodynamically stable - weaned to RA during day - sats 89% or > w/ ambulation - to continue nightly CPAP as per prior regimen   Paroxysmal atrial fibrillation Sinus rhythm at time of d/c - cont amiodarone and DOAC  Acute on CKD stage III (baseline Cr~1.83) Followed renal function with diuresis - crt steadily improved w/ nadir ~2.0  Acute on chronic diastolic CHF Lowest recent weight on record ~115kg August 2018 - much less  volume overloaded w/ weight trending down nicely th/o hospital stay - cont diuretic at thome - EDW set at 248# w/ pt instructed to increase lasix to 7m AM / 659mPM when weight above 250# - discussed/educated on wgt tracking at home w/ use of prn extra diuretic and fluid restriction  Essential HTN Blood pressure stable   DM2 w/ hypoglycemia  prior to admission patient's PCP discontinued his Cogentin, Amaryl, Lantus secondary to hypoglycemia - 1/11 A1c 5.6 -  hypoglycemia likely due to poor clearance of previously utilized DM meds in face of worsening renal fxn - CBG in goal range w/ use of low dose amaryl only   HLD continue Zocor   Allergies as of 01/13/2018      Reactions   Bactrim [sulfamethoxazole-trimethoprim]       Medication List    STOP taking these medications   doxycycline 100 MG tablet Commonly known as:  VIBRA-TABS   TRADJENTA 5 MG Tabs tablet Generic drug:  linagliptin     TAKE these medications   amiodarone 200 MG tablet Commonly known as:  PACERONE Take 1 tablet (200 mg total) daily by mouth.   COMBIVENT RESPIMAT 20-100 MCG/ACT Aers respimat Generic drug:  Ipratropium-Albuterol Inhale 1 puff into the lungs daily as needed for wheezing.   DUONEB 0.5-2.5 (3) MG/3ML Soln Generic drug:  ipratropium-albuterol Take 3 mLs by nebulization as needed.   DRY EYE FORMULA PO Place 2 drops into both eyes daily as needed.   ELIQUIS 5 MG Tabs tablet Generic drug:  apixaban Take 5 mg by mouth 2 (two) times daily.  furosemide 20 MG tablet Commonly known as:  LASIX Take 60 mg by mouth 2 (two) times daily.   glimepiride 2 MG tablet Commonly known as:  AMARYL Take 1 tablet (2 mg total) by mouth daily with breakfast. Start taking on:  01/14/2018 What changed:    medication strength  when to take this   metolazone 5 MG tablet Commonly known as:  ZAROXOLYN Take 1 tablet (5 mg total) by mouth 2 (two) times daily.   nebivolol 5 MG tablet Commonly known as:   BYSTOLIC Take 5 mg by mouth daily.   nitroGLYCERIN 0.4 MG SL tablet Commonly known as:  NITROSTAT Place 0.4 mg under the tongue.   pantoprazole 40 MG tablet Commonly known as:  PROTONIX Take 40 mg by mouth as needed.   simvastatin 20 MG tablet Commonly known as:  ZOCOR Take 20 mg by mouth at bedtime.       Day of Discharge BP 114/68 (BP Location: Left Arm)   Pulse 69   Temp 97.9 F (36.6 C) (Oral)   Resp 18   Ht 5' 11"  (1.803 m)   Wt 112.3 kg (247 lb 8 oz)   SpO2 95%   BMI 34.52 kg/m   Physical Exam: General: No acute respiratory distress Lungs: Clear to auscultation bilaterally without wheezes or crackles Cardiovascular: Regular rate and rhythm without murmur gallop or rub normal S1 and S2 Abdomen: Nontender, nondistended, soft, bowel sounds positive, no rebound, no ascites, no appreciable mass Extremities: No significant cyanosis, clubbing, or edema bilateral lower extremities  Basic Metabolic Panel: Recent Labs  Lab 01/07/18 0411 01/08/18 0433 01/09/18 0943 01/11/18 0817 01/12/18 0612 01/13/18 0343  NA 131* 133* 136 134* 137 138  K 5.0 5.5* 4.4 4.3 4.1 4.2  CL 94* 95* 95* 90* 89* 90*  CO2 24 25 28 31 31  36*  GLUCOSE 152* 157* 104* 174* 108* 108*  BUN 81* 86* 90* 91* 91* 89*  CREATININE 2.44* 2.44* 2.35* 2.09* 2.05* 2.02*  CALCIUM 9.1 9.4 9.3 9.4 9.5 9.3  MG 2.5*  --  2.4  --   --   --     CBC: Recent Labs  Lab 01/07/18 0411 01/09/18 0943 01/13/18 0343  WBC 16.5* 14.1* 12.8*  HGB 11.8* 10.9* 8.9*  HCT 36.2* 34.4* 28.2*  MCV 91.4 92.7 92.2  PLT 261 248 198    ProBNP (last 3 results) Recent Labs    09/17/17 1627 11/04/17 1112  PROBNP 6,365* 6,012*    CBG: Recent Labs  Lab 01/12/18 1212 01/12/18 1604 01/12/18 2042 01/13/18 0728 01/13/18 1116  GLUCAP 154* 104* 231* 112* 183*    Time spent in discharge (includes decision making & examination of pt): >35 minutes  01/13/2018, 4:52 PM   Cherene Altes, MD [facility] Office  [phone] Pager [phone]  On-Call/Text Page:      [email or web address]      password [email or web address]

## 2018-01-13 NOTE — Progress Notes (Signed)
SATURATION QUALIFICATIONS: (This note is used to comply with regulatory documentation for home oxygen)  Patient Saturations on Room Air at Rest = 95%  Patient Saturations on Room Air while Ambulating = 89-94%  Patient Saturations on 0 Liters of oxygen while Ambulating =   Please briefly explain why patient needs home oxygen: Patient ambulated hallway without any oxygen use. Maintained 89-94% oxygen saturation. Denied any issues while ambulating.

## 2018-01-14 DIAGNOSIS — I509 Heart failure, unspecified: Secondary | ICD-10-CM | POA: Diagnosis not present

## 2018-01-14 DIAGNOSIS — Z6838 Body mass index (BMI) 38.0-38.9, adult: Secondary | ICD-10-CM | POA: Diagnosis not present

## 2018-01-14 DIAGNOSIS — J189 Pneumonia, unspecified organism: Secondary | ICD-10-CM | POA: Diagnosis not present

## 2018-01-14 DIAGNOSIS — N189 Chronic kidney disease, unspecified: Secondary | ICD-10-CM | POA: Diagnosis not present

## 2018-01-15 DIAGNOSIS — R0602 Shortness of breath: Secondary | ICD-10-CM | POA: Diagnosis not present

## 2018-01-15 DIAGNOSIS — N183 Chronic kidney disease, stage 3 (moderate): Secondary | ICD-10-CM | POA: Diagnosis not present

## 2018-01-15 DIAGNOSIS — D500 Iron deficiency anemia secondary to blood loss (chronic): Secondary | ICD-10-CM | POA: Diagnosis not present

## 2018-01-15 DIAGNOSIS — Z7901 Long term (current) use of anticoagulants: Secondary | ICD-10-CM | POA: Diagnosis not present

## 2018-01-15 DIAGNOSIS — K922 Gastrointestinal hemorrhage, unspecified: Secondary | ICD-10-CM | POA: Diagnosis not present

## 2018-01-15 DIAGNOSIS — I509 Heart failure, unspecified: Secondary | ICD-10-CM | POA: Diagnosis not present

## 2018-01-15 DIAGNOSIS — N179 Acute kidney failure, unspecified: Secondary | ICD-10-CM | POA: Diagnosis not present

## 2018-01-15 DIAGNOSIS — E1139 Type 2 diabetes mellitus with other diabetic ophthalmic complication: Secondary | ICD-10-CM | POA: Diagnosis not present

## 2018-01-15 DIAGNOSIS — Z950 Presence of cardiac pacemaker: Secondary | ICD-10-CM | POA: Diagnosis not present

## 2018-01-15 DIAGNOSIS — I480 Paroxysmal atrial fibrillation: Secondary | ICD-10-CM | POA: Diagnosis not present

## 2018-01-15 DIAGNOSIS — I129 Hypertensive chronic kidney disease with stage 1 through stage 4 chronic kidney disease, or unspecified chronic kidney disease: Secondary | ICD-10-CM | POA: Diagnosis not present

## 2018-01-15 DIAGNOSIS — E1122 Type 2 diabetes mellitus with diabetic chronic kidney disease: Secondary | ICD-10-CM | POA: Diagnosis not present

## 2018-01-15 DIAGNOSIS — D631 Anemia in chronic kidney disease: Secondary | ICD-10-CM | POA: Diagnosis not present

## 2018-01-16 DIAGNOSIS — I5033 Acute on chronic diastolic (congestive) heart failure: Secondary | ICD-10-CM | POA: Diagnosis not present

## 2018-01-16 DIAGNOSIS — J449 Chronic obstructive pulmonary disease, unspecified: Secondary | ICD-10-CM | POA: Diagnosis not present

## 2018-01-16 DIAGNOSIS — I480 Paroxysmal atrial fibrillation: Secondary | ICD-10-CM | POA: Diagnosis not present

## 2018-01-16 DIAGNOSIS — I2510 Atherosclerotic heart disease of native coronary artery without angina pectoris: Secondary | ICD-10-CM | POA: Diagnosis not present

## 2018-01-16 DIAGNOSIS — Z794 Long term (current) use of insulin: Secondary | ICD-10-CM | POA: Diagnosis not present

## 2018-01-16 DIAGNOSIS — Z8701 Personal history of pneumonia (recurrent): Secondary | ICD-10-CM | POA: Diagnosis not present

## 2018-01-16 DIAGNOSIS — E1140 Type 2 diabetes mellitus with diabetic neuropathy, unspecified: Secondary | ICD-10-CM | POA: Diagnosis not present

## 2018-01-16 DIAGNOSIS — Z87891 Personal history of nicotine dependence: Secondary | ICD-10-CM | POA: Diagnosis not present

## 2018-01-16 DIAGNOSIS — I110 Hypertensive heart disease with heart failure: Secondary | ICD-10-CM | POA: Diagnosis not present

## 2018-01-16 DIAGNOSIS — Z7901 Long term (current) use of anticoagulants: Secondary | ICD-10-CM | POA: Diagnosis not present

## 2018-01-16 DIAGNOSIS — D509 Iron deficiency anemia, unspecified: Secondary | ICD-10-CM | POA: Diagnosis not present

## 2018-01-17 ENCOUNTER — Ambulatory Visit: Payer: Medicare Other | Admitting: Cardiology

## 2018-01-17 ENCOUNTER — Encounter: Payer: Self-pay | Admitting: Cardiology

## 2018-01-17 ENCOUNTER — Ambulatory Visit (INDEPENDENT_AMBULATORY_CARE_PROVIDER_SITE_OTHER): Payer: Medicare Other | Admitting: Cardiology

## 2018-01-17 VITALS — Ht 71.0 in | Wt 232.0 lb

## 2018-01-17 DIAGNOSIS — I2510 Atherosclerotic heart disease of native coronary artery without angina pectoris: Secondary | ICD-10-CM

## 2018-01-17 DIAGNOSIS — I10 Essential (primary) hypertension: Secondary | ICD-10-CM | POA: Diagnosis not present

## 2018-01-17 DIAGNOSIS — I5032 Chronic diastolic (congestive) heart failure: Secondary | ICD-10-CM | POA: Diagnosis not present

## 2018-01-17 DIAGNOSIS — Z950 Presence of cardiac pacemaker: Secondary | ICD-10-CM | POA: Diagnosis not present

## 2018-01-17 DIAGNOSIS — D649 Anemia, unspecified: Secondary | ICD-10-CM | POA: Diagnosis not present

## 2018-01-17 DIAGNOSIS — I480 Paroxysmal atrial fibrillation: Secondary | ICD-10-CM

## 2018-01-17 NOTE — Patient Instructions (Signed)
Medication Instructions:  Your physician recommends that you continue on your current medications as directed. Please refer to the Current Medication list given to you today.  Labwork: None ordered  Testing/Procedures: EKG today  Follow-Up: Your physician recommends that you schedule a follow-up appointment in: 2 weeks with Dr. [name]   Any Other Special Instructions Will Be Listed Below (If Applicable).     If you need a refill on your cardiac medications before your next appointment, please call your pharmacy.

## 2018-01-17 NOTE — Progress Notes (Signed)
Cardiology Office Note:    Date:  01/17/2018   ID:  Lawrence [name], DOB [birth year], MRN [id number]  PCP:  [name], MD  Cardiologist:  [name], MD    Referring MD: [name], MD   Chief Complaint  Patient presents with  . Hospitalization Follow-up  Doing better  History of Present Illness:    Lawrence [name] is a 72 y.o. male with multiple medical problems.  Last time I seen him he was sick, short of breath, hypoglycemic events she ended up being sent to the emergency room.  He was admitted to [facility].  He was managed appropriately over there.  And now comes for follow-up.  He is doing better still exhausted and tired.  Still looking somewhat pale.  In the meantime he visited hematologist and was given iron injection.  Denies have any chest pain tightness squeezing pressure burning chest no palpitations.  Past Medical History:  Diagnosis Date  . Anemia   . Chronic airway obstruction, not elsewhere classified   . Chronic ischemic heart disease, unspecified   . Coronary artery disease   . Diabetes mellitus    type II, neuropathy,   . Essential hypertension, benign   . Hyperlipidemia, mixed   . Nodule of kidney    incidentally found on CT abdomen 11/2012.  Pending Urology evaluation.   . Nontoxic multinodular goiter   . Obesity   . Other testicular hypofunction   . PUD (peptic ulcer disease)   . Renal insufficiency, mild    hx of    Past Surgical History:  Procedure Laterality Date  . CHOLECYSTECTOMY    . CORONARY ARTERY BYPASS GRAFT  2007   x 4  . ESOPHAGOGASTRODUODENOSCOPY     10/06/2012:  hiatal hernia, moderate gastritis.  2012: Colonoscopy at [facility]:  one polyp.  Due next 2017  . EYE SURGERY    . KNEE DEBRIDEMENT    . PACEMAKER IMPLANT N/A 08/22/2017   Procedure: Pacemaker Implant;  Surgeon: Constance Haw, MD;  Location: [place] [facility];  Service: Cardiovascular;  Laterality: N/A;  . SHOULDER SURGERY     left shoulder  .  TONSILECTOMY, ADENOIDECTOMY, BILATERAL MYRINGOTOMY AND TUBES      Current Medications: Current Meds  Medication Sig  . amiodarone (PACERONE) 200 MG tablet Take 1 tablet (200 mg total) daily by mouth.  Marland Kitchen apixaban (ELIQUIS) 5 MG TABS tablet Take 5 mg by mouth 2 (two) times daily.  . furosemide (LASIX) 20 MG tablet Take 60 mg by mouth 2 (two) times daily.   Marland Kitchen glimepiride (AMARYL) 2 MG tablet Take 1 tablet (2 mg total) by mouth daily with breakfast.  . Ipratropium-Albuterol (COMBIVENT RESPIMAT) 20-100 MCG/ACT AERS respimat Inhale 1 puff into the lungs daily as needed for wheezing.  Marland Kitchen ipratropium-albuterol (DUONEB) 0.5-2.5 (3) MG/3ML SOLN Take 3 mLs by nebulization as needed.  . metolazone (ZAROXOLYN) 5 MG tablet Take 5 mg by mouth daily. Takes once a day 3 times per week/ m-w-f  . Multiple Vitamins-Minerals (DRY EYE FORMULA PO) Place 2 drops into both eyes daily as needed.  . nebivolol (BYSTOLIC) 5 MG tablet Take 5 mg by mouth daily.  . nitroGLYCERIN (NITROSTAT) 0.4 MG SL tablet Place 0.4 mg under the tongue.  . pantoprazole (PROTONIX) 40 MG tablet Take 40 mg by mouth as needed.   . simvastatin (ZOCOR) 20 MG tablet Take 20 mg by mouth at bedtime.      Allergies:   Bactrim [  sulfamethoxazole-trimethoprim]   Social History   Socioeconomic History  . Marital status: Married    Spouse name: None  . Number of children: 5  . Years of education: None  . Highest education level: None  Social Needs  . Financial resource strain: None  . Food insecurity - worry: None  . Food insecurity - inability: None  . Transportation needs - medical: None  . Transportation needs - non-medical: None  Occupational History  . Occupation: Retired    Comment: Medical illustrator; retired Chiropodist  Tobacco Use  . Smoking status: Former Smoker    Packs/day: 1.50    Years: 30.00    Pack years: 45.00    Types: Cigarettes    Last attempt to quit: 2006    Years since quitting: 13.0  . Smokeless tobacco:  Former Systems developer    Types: Chew  Substance and Sexual Activity  . Alcohol use: Yes    Comment: rare  . Drug use: No  . Sexual activity: None  Other Topics Concern  . None  Social History Narrative   ** Merged History Encounter **       Lives with wife in a one story home.  Has 6 children.  Retired Nature conservation officer.  Education: college.     Family History: The patient's family history includes Asthma in his sister; Cancer in his father and mother; Colon cancer in his mother; Emphysema in his unknown relative; Esophageal cancer in his father; Hypertension in his mother; Kidney cancer in his mother; Stroke in his father. ROS:   Please see the history of present illness.    All 14 point review of systems negative except as described per history of present illness  EKGs/Labs/Other Studies Reviewed:      Recent Labs: 11/04/2017: NT-Pro BNP 6,012 01/02/2018: B Natriuretic Peptide 1,559.8 01/03/2018: ALT 26; TSH 6.384 01/09/2018: Magnesium 2.4 01/13/2018: BUN 89; Creatinine, Ser 2.02; Hemoglobin 8.9; Platelets 198; Potassium 4.2; Sodium 138  Recent Lipid Panel No results found for: CHOL, TRIG, HDL, CHOLHDL, VLDL, LDLCALC, LDLDIRECT  Physical Exam:    VS:  Ht 5' 11"  (1.803 m)   Wt 232 lb (105.2 kg)   BMI 32.36 kg/m     Wt Readings from Last 3 Encounters:  01/17/18 232 lb (105.2 kg)  01/13/18 247 lb 8 oz (112.3 kg)  01/02/18 267 lb (121.1 kg)     GEN:  Well nourished, well developed in no acute distress HEENT: Normal NECK: No JVD; No carotid bruits LYMPHATICS: No lymphadenopathy CARDIAC: RRR, no murmurs, no rubs, no gallops RESPIRATORY:  Clear to auscultation without rales, wheezing or rhonchi  ABDOMEN: Soft, non-tender, non-distended MUSCULOSKELETAL:  No edema; No deformity  SKIN: Warm and dry LOWER EXTREMITIES: no swelling NEUROLOGIC:  Alert and oriented x 3 PSYCHIATRIC:  Normal affect   ASSESSMENT:    1. CHF (congestive heart failure), NYHA class II, chronic, diastolic ([place])     2. Coronary artery disease involving native coronary artery of native heart without angina pectoris   3. Essential hypertension, benign   4. Paroxysmal atrial fibrillation (HCC)   5. Pacemaker   6. Anemia, unspecified type    PLAN:    In order of problems listed above:  1. Congestive heart failure: Appears to be compensated right now.  I will retrieve his Chem-7 that was done 2 days ago.  We will maintain him on current diuretics he is taking Zaroxolyn as well as furosemide. 2. Coronary artery disease: Stable no recent issues 3. Essential hypertension: Blood  pressure appears to be well controlled continue present management 4. Proximal mitral fibrillation.  I will do EKG today he seems somewhat irregular 5. Pacemaker: Normal function. 6. Anemia status post recent infusion of iron   Medication Adjustments/Labs and Tests Ordered: Current medicines are reviewed at length with the patient today.  Concerns regarding medicines are outlined above.  No orders of the defined types were placed in this encounter.  Medication changes: No orders of the defined types were placed in this encounter.   Signed, Park Liter, MD, [facility] 01/17/2018 9:40 AM    [place]

## 2018-01-20 DIAGNOSIS — I480 Paroxysmal atrial fibrillation: Secondary | ICD-10-CM | POA: Diagnosis not present

## 2018-01-20 DIAGNOSIS — I5033 Acute on chronic diastolic (congestive) heart failure: Secondary | ICD-10-CM | POA: Diagnosis not present

## 2018-01-20 DIAGNOSIS — E1140 Type 2 diabetes mellitus with diabetic neuropathy, unspecified: Secondary | ICD-10-CM | POA: Diagnosis not present

## 2018-01-20 DIAGNOSIS — I2510 Atherosclerotic heart disease of native coronary artery without angina pectoris: Secondary | ICD-10-CM | POA: Diagnosis not present

## 2018-01-20 DIAGNOSIS — I110 Hypertensive heart disease with heart failure: Secondary | ICD-10-CM | POA: Diagnosis not present

## 2018-01-20 DIAGNOSIS — J449 Chronic obstructive pulmonary disease, unspecified: Secondary | ICD-10-CM | POA: Diagnosis not present

## 2018-01-22 ENCOUNTER — Encounter (INDEPENDENT_AMBULATORY_CARE_PROVIDER_SITE_OTHER): Payer: Self-pay

## 2018-01-22 DIAGNOSIS — D500 Iron deficiency anemia secondary to blood loss (chronic): Secondary | ICD-10-CM | POA: Diagnosis not present

## 2018-01-23 DIAGNOSIS — E1140 Type 2 diabetes mellitus with diabetic neuropathy, unspecified: Secondary | ICD-10-CM | POA: Diagnosis not present

## 2018-01-23 DIAGNOSIS — I2510 Atherosclerotic heart disease of native coronary artery without angina pectoris: Secondary | ICD-10-CM | POA: Diagnosis not present

## 2018-01-23 DIAGNOSIS — I110 Hypertensive heart disease with heart failure: Secondary | ICD-10-CM | POA: Diagnosis not present

## 2018-01-23 DIAGNOSIS — I480 Paroxysmal atrial fibrillation: Secondary | ICD-10-CM | POA: Diagnosis not present

## 2018-01-23 DIAGNOSIS — I5033 Acute on chronic diastolic (congestive) heart failure: Secondary | ICD-10-CM | POA: Diagnosis not present

## 2018-01-23 DIAGNOSIS — J449 Chronic obstructive pulmonary disease, unspecified: Secondary | ICD-10-CM | POA: Diagnosis not present

## 2018-01-25 DIAGNOSIS — I5033 Acute on chronic diastolic (congestive) heart failure: Secondary | ICD-10-CM | POA: Diagnosis not present

## 2018-01-27 DIAGNOSIS — E1140 Type 2 diabetes mellitus with diabetic neuropathy, unspecified: Secondary | ICD-10-CM | POA: Diagnosis not present

## 2018-01-27 DIAGNOSIS — J449 Chronic obstructive pulmonary disease, unspecified: Secondary | ICD-10-CM | POA: Diagnosis not present

## 2018-01-27 DIAGNOSIS — I480 Paroxysmal atrial fibrillation: Secondary | ICD-10-CM | POA: Diagnosis not present

## 2018-01-27 DIAGNOSIS — I110 Hypertensive heart disease with heart failure: Secondary | ICD-10-CM | POA: Diagnosis not present

## 2018-01-27 DIAGNOSIS — I5033 Acute on chronic diastolic (congestive) heart failure: Secondary | ICD-10-CM | POA: Diagnosis not present

## 2018-01-27 DIAGNOSIS — I2510 Atherosclerotic heart disease of native coronary artery without angina pectoris: Secondary | ICD-10-CM | POA: Diagnosis not present

## 2018-01-30 DIAGNOSIS — I5033 Acute on chronic diastolic (congestive) heart failure: Secondary | ICD-10-CM | POA: Diagnosis not present

## 2018-01-30 DIAGNOSIS — I110 Hypertensive heart disease with heart failure: Secondary | ICD-10-CM | POA: Diagnosis not present

## 2018-01-30 DIAGNOSIS — I2510 Atherosclerotic heart disease of native coronary artery without angina pectoris: Secondary | ICD-10-CM | POA: Diagnosis not present

## 2018-01-30 DIAGNOSIS — J449 Chronic obstructive pulmonary disease, unspecified: Secondary | ICD-10-CM | POA: Diagnosis not present

## 2018-01-30 DIAGNOSIS — E1140 Type 2 diabetes mellitus with diabetic neuropathy, unspecified: Secondary | ICD-10-CM | POA: Diagnosis not present

## 2018-01-30 DIAGNOSIS — I480 Paroxysmal atrial fibrillation: Secondary | ICD-10-CM | POA: Diagnosis not present

## 2018-01-31 ENCOUNTER — Encounter: Payer: Self-pay | Admitting: Cardiology

## 2018-01-31 ENCOUNTER — Ambulatory Visit (INDEPENDENT_AMBULATORY_CARE_PROVIDER_SITE_OTHER): Payer: Medicare Other | Admitting: Cardiology

## 2018-01-31 VITALS — BP 112/62 | HR 76 | Resp 14 | Ht 71.0 in | Wt 226.4 lb

## 2018-01-31 DIAGNOSIS — I10 Essential (primary) hypertension: Secondary | ICD-10-CM

## 2018-01-31 DIAGNOSIS — I5032 Chronic diastolic (congestive) heart failure: Secondary | ICD-10-CM

## 2018-01-31 DIAGNOSIS — I2510 Atherosclerotic heart disease of native coronary artery without angina pectoris: Secondary | ICD-10-CM

## 2018-01-31 DIAGNOSIS — E782 Mixed hyperlipidemia: Secondary | ICD-10-CM | POA: Diagnosis not present

## 2018-01-31 DIAGNOSIS — Z950 Presence of cardiac pacemaker: Secondary | ICD-10-CM

## 2018-01-31 NOTE — Progress Notes (Signed)
Cardiology Office Note:    Date:  01/31/2018   ID:  Lawrence [name], DOB 09-03-1946, MRN [id number]  PCP:  [name], MD  Cardiologist:  [name], MD    Referring MD: [name], MD   Chief Complaint  Patient presents with  . 2 Week Follow-up  Doing well  History of Present Illness:    Lawrence [name] is a 72 y.o. male with congestive heart failure coronary artery disease hypertension proximal mitral fibrillation he is doing great asymptomatic lost significant amount of weight was able to walk and shop no difficulties.  Well  Past Medical History:  Diagnosis Date  . Anemia   . Chronic airway obstruction, not elsewhere classified   . Chronic ischemic heart disease, unspecified   . Coronary artery disease   . Diabetes mellitus    type II, neuropathy,   . Essential hypertension, benign   . Hyperlipidemia, mixed   . Nodule of kidney    incidentally found on CT abdomen 11/2012.  Pending Urology evaluation.   . Nontoxic multinodular goiter   . Obesity   . Other testicular hypofunction   . PUD (peptic ulcer disease)   . Renal insufficiency, mild    hx of    Past Surgical History:  Procedure Laterality Date  . CHOLECYSTECTOMY    . CORONARY ARTERY BYPASS GRAFT  2007   x 4  . ESOPHAGOGASTRODUODENOSCOPY     10/06/2012:  hiatal hernia, moderate gastritis.  2012: Colonoscopy at [facility]:  one polyp.  Due next 2017  . EYE SURGERY    . KNEE DEBRIDEMENT    . PACEMAKER IMPLANT N/A 08/22/2017   Procedure: Pacemaker Implant;  Surgeon: Constance Haw, MD;  Location: [place] [facility];  Service: Cardiovascular;  Laterality: N/A;  . SHOULDER SURGERY     left shoulder  . TONSILECTOMY, ADENOIDECTOMY, BILATERAL MYRINGOTOMY AND TUBES      Current Medications: Current Meds  Medication Sig  . amiodarone (PACERONE) 200 MG tablet Take 1 tablet (200 mg total) daily by mouth.  Marland Kitchen apixaban (ELIQUIS) 5 MG TABS tablet Take 5 mg by mouth 2 (two) times daily.  .  furosemide (LASIX) 20 MG tablet Take 60 mg by mouth 2 (two) times daily.   Marland Kitchen glimepiride (AMARYL) 2 MG tablet Take 1 tablet (2 mg total) by mouth daily with breakfast.  . Ipratropium-Albuterol (COMBIVENT RESPIMAT) 20-100 MCG/ACT AERS respimat Inhale 1 puff into the lungs daily as needed for wheezing.  Marland Kitchen ipratropium-albuterol (DUONEB) 0.5-2.5 (3) MG/3ML SOLN Take 3 mLs by nebulization as needed.  . metolazone (ZAROXOLYN) 5 MG tablet Take 5 mg by mouth daily. Takes once a day 3 times per week/ m-w-f  . Multiple Vitamins-Minerals (DRY EYE FORMULA PO) Place 2 drops into both eyes daily as needed.  . nebivolol (BYSTOLIC) 5 MG tablet Take 5 mg by mouth daily.  . nitroGLYCERIN (NITROSTAT) 0.4 MG SL tablet Place 0.4 mg under the tongue.  . pantoprazole (PROTONIX) 40 MG tablet Take 40 mg by mouth as needed.   . simvastatin (ZOCOR) 20 MG tablet Take 20 mg by mouth at bedtime.      Allergies:   Bactrim [sulfamethoxazole-trimethoprim]   Social History   Socioeconomic History  . Marital status: Married    Spouse name: Not on file  . Number of children: 5  . Years of education: Not on file  . Highest education level: Not on file  Social Needs  . Financial resource strain: Not on  file  . Food insecurity - worry: Not on file  . Food insecurity - inability: Not on file  . Transportation needs - medical: Not on file  . Transportation needs - non-medical: Not on file  Occupational History  . Occupation: Retired    Comment: Medical illustrator; retired Chiropodist  Tobacco Use  . Smoking status: Former Smoker    Packs/day: 1.50    Years: 30.00    Pack years: 45.00    Types: Cigarettes    Last attempt to quit: 2006    Years since quitting: 13.1  . Smokeless tobacco: Former Systems developer    Types: Chew  Substance and Sexual Activity  . Alcohol use: Yes    Comment: rare  . Drug use: No  . Sexual activity: Not on file  Other Topics Concern  . Not on file  Social History Narrative   ** Merged  History Encounter **       Lives with wife in a one story home.  Has 6 children.  Retired Nature conservation officer.  Education: college.     Family History: The patient's family history includes Asthma in his sister; Cancer in his father and mother; Colon cancer in his mother; Emphysema in his unknown relative; Esophageal cancer in his father; Hypertension in his mother; Kidney cancer in his mother; Stroke in his father. ROS:   Please see the history of present illness.    All 14 point review of systems negative except as described per history of present illness  EKGs/Labs/Other Studies Reviewed:      Recent Labs: 11/04/2017: NT-Pro BNP 6,012 01/02/2018: B Natriuretic Peptide 1,559.8 01/03/2018: ALT 26; TSH 6.384 01/09/2018: Magnesium 2.4 01/13/2018: BUN 89; Creatinine, Ser 2.02; Hemoglobin 8.9; Platelets 198; Potassium 4.2; Sodium 138  Recent Lipid Panel No results found for: CHOL, TRIG, HDL, CHOLHDL, VLDL, LDLCALC, LDLDIRECT  Physical Exam:    VS:  BP 112/62   Pulse 76   Resp 14   Ht 5\' 11"  (1.803 m)   Wt 226 lb 6.4 oz (102.7 kg)   BMI 31.58 kg/m     Wt Readings from Last 3 Encounters:  01/31/18 226 lb 6.4 oz (102.7 kg)  01/17/18 232 lb (105.2 kg)  01/13/18 247 lb 8 oz (112.3 kg)     GEN:  Well nourished, well developed in no acute distress HEENT: Normal NECK: No JVD; No carotid bruits LYMPHATICS: No lymphadenopathy CARDIAC: RRR, no murmurs, no rubs, no gallops RESPIRATORY:  Clear to auscultation without rales, wheezing or rhonchi  ABDOMEN: Soft, non-tender, non-distended MUSCULOSKELETAL:  No edema; No deformity  SKIN: Warm and dry LOWER EXTREMITIES: no swelling NEUROLOGIC:  Alert and oriented x 3 PSYCHIATRIC:  Normal affect   ASSESSMENT:    1. CHF (congestive heart failure), NYHA class II, chronic, diastolic ([place])   2. Coronary artery disease involving native coronary artery of native heart without angina pectoris   3. Essential hypertension, benign   4. Hyperlipidemia, mixed    5. Pacemaker    PLAN:    In order of problems listed above:  1. Congestive heart failure: Compensated.  We will check his Chem-7 as well as proBNP.  I am doing this mostly to establish baseline he really looks good and I would like to know what his numbers are looking when he feeling that well.  We will not change any of his medications 2. CAD: Doing well we will continue present management. 3. Essential hypertension: Blood pressure stable we will continue present management 4. Lipidemia: He is  on small dose of simvastatin with talking about intensification of the therapy however he did have some problem with Lipitor they will for we will continue with present medications 5. Pacemaker: Noted stable continue present management   Medication Adjustments/Labs and Tests Ordered: Current medicines are reviewed at length with the patient today.  Concerns regarding medicines are outlined above.  No orders of the defined types were placed in this encounter.  Medication changes: No orders of the defined types were placed in this encounter.   Signed, Park Liter, MD, [facility] 01/31/2018 10:34 AM    [place]

## 2018-01-31 NOTE — Patient Instructions (Signed)
Medication Instructions:  Your physician recommends that you continue on your current medications as directed. Please refer to the Current Medication list given to you today.  Labwork: Your physician recommends that you have lab work today: BMP and BNP  Testing/Procedures: None ordered  Follow-Up: Your physician recommends that you schedule a follow-up appointment in: 1 month with Dr. Krasowski   Any Other Special Instructions Will Be Listed Below (If Applicable).     If you need a refill on your cardiac medications before your next appointment, please call your pharmacy.   

## 2018-02-01 LAB — BASIC METABOLIC PANEL
BUN/Creatinine Ratio: 29 — ABNORMAL HIGH (ref 10–24)
BUN: 62 mg/dL — ABNORMAL HIGH (ref 8–27)
CHLORIDE: 94 mmol/L — AB (ref 96–106)
CO2: 27 mmol/L (ref 20–29)
Calcium: 9.7 mg/dL (ref 8.6–10.2)
Creatinine, Ser: 2.11 mg/dL — ABNORMAL HIGH (ref 0.76–1.27)
GFR calc Af Amer: 35 mL/min/{1.73_m2} — ABNORMAL LOW (ref >59)
GFR calc non Af Amer: 30 mL/min/{1.73_m2} — ABNORMAL LOW (ref >59)
GLUCOSE: 150 mg/dL — AB (ref 65–99)
Potassium: 4.9 mmol/L (ref 3.5–5.2)
SODIUM: 141 mmol/L (ref 134–144)

## 2018-02-01 LAB — PRO B NATRIURETIC PEPTIDE: NT-PRO BNP: 6793 pg/mL — AB (ref 0–376)

## 2018-02-06 DIAGNOSIS — I480 Paroxysmal atrial fibrillation: Secondary | ICD-10-CM | POA: Diagnosis not present

## 2018-02-06 DIAGNOSIS — E1140 Type 2 diabetes mellitus with diabetic neuropathy, unspecified: Secondary | ICD-10-CM | POA: Diagnosis not present

## 2018-02-06 DIAGNOSIS — I5033 Acute on chronic diastolic (congestive) heart failure: Secondary | ICD-10-CM | POA: Diagnosis not present

## 2018-02-06 DIAGNOSIS — J449 Chronic obstructive pulmonary disease, unspecified: Secondary | ICD-10-CM | POA: Diagnosis not present

## 2018-02-06 DIAGNOSIS — I110 Hypertensive heart disease with heart failure: Secondary | ICD-10-CM | POA: Diagnosis not present

## 2018-02-06 DIAGNOSIS — I2510 Atherosclerotic heart disease of native coronary artery without angina pectoris: Secondary | ICD-10-CM | POA: Diagnosis not present

## 2018-02-13 DIAGNOSIS — J449 Chronic obstructive pulmonary disease, unspecified: Secondary | ICD-10-CM | POA: Diagnosis not present

## 2018-02-13 DIAGNOSIS — E1140 Type 2 diabetes mellitus with diabetic neuropathy, unspecified: Secondary | ICD-10-CM | POA: Diagnosis not present

## 2018-02-13 DIAGNOSIS — I2510 Atherosclerotic heart disease of native coronary artery without angina pectoris: Secondary | ICD-10-CM | POA: Diagnosis not present

## 2018-02-13 DIAGNOSIS — I5033 Acute on chronic diastolic (congestive) heart failure: Secondary | ICD-10-CM | POA: Diagnosis not present

## 2018-02-13 DIAGNOSIS — I110 Hypertensive heart disease with heart failure: Secondary | ICD-10-CM | POA: Diagnosis not present

## 2018-02-13 DIAGNOSIS — I480 Paroxysmal atrial fibrillation: Secondary | ICD-10-CM | POA: Diagnosis not present

## 2018-02-19 DIAGNOSIS — E1165 Type 2 diabetes mellitus with hyperglycemia: Secondary | ICD-10-CM | POA: Diagnosis not present

## 2018-02-19 DIAGNOSIS — K922 Gastrointestinal hemorrhage, unspecified: Secondary | ICD-10-CM | POA: Diagnosis not present

## 2018-02-19 DIAGNOSIS — D500 Iron deficiency anemia secondary to blood loss (chronic): Secondary | ICD-10-CM | POA: Diagnosis not present

## 2018-02-19 DIAGNOSIS — N189 Chronic kidney disease, unspecified: Secondary | ICD-10-CM | POA: Diagnosis not present

## 2018-02-19 DIAGNOSIS — D508 Other iron deficiency anemias: Secondary | ICD-10-CM | POA: Diagnosis not present

## 2018-02-19 DIAGNOSIS — R945 Abnormal results of liver function studies: Secondary | ICD-10-CM | POA: Diagnosis not present

## 2018-02-19 DIAGNOSIS — D631 Anemia in chronic kidney disease: Secondary | ICD-10-CM | POA: Diagnosis not present

## 2018-02-20 ENCOUNTER — Encounter (INDEPENDENT_AMBULATORY_CARE_PROVIDER_SITE_OTHER): Payer: Self-pay

## 2018-02-24 ENCOUNTER — Emergency Department (HOSPITAL_BASED_OUTPATIENT_CLINIC_OR_DEPARTMENT_OTHER)
Admission: EM | Admit: 2018-02-24 | Discharge: 2018-02-24 | Disposition: A | Discharge status: Home / Self Care | Payer: Medicare Other | Attending: Emergency Medicine | Admitting: Emergency Medicine

## 2018-02-24 ENCOUNTER — Emergency Department (HOSPITAL_BASED_OUTPATIENT_CLINIC_OR_DEPARTMENT_OTHER): Payer: Medicare Other

## 2018-02-24 ENCOUNTER — Other Ambulatory Visit: Payer: Self-pay

## 2018-02-24 ENCOUNTER — Encounter (HOSPITAL_BASED_OUTPATIENT_CLINIC_OR_DEPARTMENT_OTHER): Payer: Self-pay | Admitting: Emergency Medicine

## 2018-02-24 DIAGNOSIS — E860 Dehydration: Secondary | ICD-10-CM

## 2018-02-24 DIAGNOSIS — I959 Hypotension, unspecified: Secondary | ICD-10-CM | POA: Diagnosis not present

## 2018-02-24 DIAGNOSIS — J449 Chronic obstructive pulmonary disease, unspecified: Secondary | ICD-10-CM | POA: Diagnosis not present

## 2018-02-24 DIAGNOSIS — M25572 Pain in left ankle and joints of left foot: Secondary | ICD-10-CM | POA: Insufficient documentation

## 2018-02-24 DIAGNOSIS — Z950 Presence of cardiac pacemaker: Secondary | ICD-10-CM | POA: Insufficient documentation

## 2018-02-24 DIAGNOSIS — I5032 Chronic diastolic (congestive) heart failure: Secondary | ICD-10-CM | POA: Insufficient documentation

## 2018-02-24 DIAGNOSIS — I110 Hypertensive heart disease with heart failure: Secondary | ICD-10-CM | POA: Diagnosis not present

## 2018-02-24 DIAGNOSIS — Z951 Presence of aortocoronary bypass graft: Secondary | ICD-10-CM | POA: Diagnosis not present

## 2018-02-24 DIAGNOSIS — Z87891 Personal history of nicotine dependence: Secondary | ICD-10-CM | POA: Insufficient documentation

## 2018-02-24 DIAGNOSIS — N189 Chronic kidney disease, unspecified: Secondary | ICD-10-CM | POA: Diagnosis not present

## 2018-02-24 DIAGNOSIS — I2510 Atherosclerotic heart disease of native coronary artery without angina pectoris: Secondary | ICD-10-CM | POA: Diagnosis not present

## 2018-02-24 DIAGNOSIS — I10 Essential (primary) hypertension: Secondary | ICD-10-CM | POA: Diagnosis not present

## 2018-02-24 DIAGNOSIS — R531 Weakness: Secondary | ICD-10-CM | POA: Insufficient documentation

## 2018-02-24 DIAGNOSIS — E119 Type 2 diabetes mellitus without complications: Secondary | ICD-10-CM | POA: Diagnosis not present

## 2018-02-24 LAB — CBC WITH DIFFERENTIAL/PLATELET
BASOS PCT: 0 %
Basophils Absolute: 0.1 10*3/uL (ref 0.0–0.1)
EOS PCT: 1 %
Eosinophils Absolute: 0.1 10*3/uL (ref 0.0–0.7)
HEMATOCRIT: 33.4 % — AB (ref 39.0–52.0)
Hemoglobin: 11 g/dL — ABNORMAL LOW (ref 13.0–17.0)
Lymphocytes Relative: 7 %
Lymphs Abs: 0.9 10*3/uL (ref 0.7–4.0)
MCH: 31.6 pg (ref 26.0–34.0)
MCHC: 32.9 g/dL (ref 30.0–36.0)
MCV: 96 fL (ref 78.0–100.0)
MONO ABS: 0.9 10*3/uL (ref 0.1–1.0)
MONOS PCT: 7 %
Neutro Abs: 10.7 10*3/uL — ABNORMAL HIGH (ref 1.7–7.7)
Neutrophils Relative %: 85 %
PLATELETS: 331 10*3/uL (ref 150–400)
RBC: 3.48 MIL/uL — ABNORMAL LOW (ref 4.22–5.81)
RDW: 14.6 % (ref 11.5–15.5)
WBC: 12.7 10*3/uL — ABNORMAL HIGH (ref 4.0–10.5)

## 2018-02-24 LAB — URINALYSIS, ROUTINE W REFLEX MICROSCOPIC
BILIRUBIN URINE: NEGATIVE
GLUCOSE, UA: NEGATIVE mg/dL
HGB URINE DIPSTICK: NEGATIVE
Ketones, ur: NEGATIVE mg/dL
Leukocytes, UA: NEGATIVE
Nitrite: NEGATIVE
Protein, ur: NEGATIVE mg/dL
SPECIFIC GRAVITY, URINE: 1.01 (ref 1.005–1.030)
pH: 6 (ref 5.0–8.0)

## 2018-02-24 LAB — COMPREHENSIVE METABOLIC PANEL
ALK PHOS: 230 U/L — AB (ref 38–126)
ALT: 67 U/L — AB (ref 17–63)
AST: 76 U/L — AB (ref 15–41)
Albumin: 3.8 g/dL (ref 3.5–5.0)
Anion gap: 13 (ref 5–15)
BILIRUBIN TOTAL: 0.8 mg/dL (ref 0.3–1.2)
BUN: 93 mg/dL — AB (ref 6–20)
CALCIUM: 9.6 mg/dL (ref 8.9–10.3)
CO2: 30 mmol/L (ref 22–32)
Chloride: 92 mmol/L — ABNORMAL LOW (ref 101–111)
Creatinine, Ser: 2.2 mg/dL — ABNORMAL HIGH (ref 0.61–1.24)
GFR calc Af Amer: 33 mL/min — ABNORMAL LOW (ref >60)
GFR calc non Af Amer: 28 mL/min — ABNORMAL LOW (ref >60)
GLUCOSE: 246 mg/dL — AB (ref 65–99)
Potassium: 3.9 mmol/L (ref 3.5–5.1)
SODIUM: 135 mmol/L (ref 135–145)
TOTAL PROTEIN: 7.7 g/dL (ref 6.5–8.1)

## 2018-02-24 LAB — BRAIN NATRIURETIC PEPTIDE: B Natriuretic Peptide: 370.5 pg/mL — ABNORMAL HIGH (ref 0.0–100.0)

## 2018-02-24 LAB — PROTIME-INR
INR: 1.42
Prothrombin Time: 17.2 seconds — ABNORMAL HIGH (ref 11.4–15.2)

## 2018-02-24 LAB — TROPONIN I: Troponin I: 0.03 ng/mL (ref <0.03)

## 2018-02-24 MED ORDER — SODIUM CHLORIDE 0.9 % IV BOLUS (SEPSIS)
500.0000 mL | Freq: Once | INTRAVENOUS | Status: AC
  Administered 2018-02-24: 500 mL via INTRAVENOUS

## 2018-02-24 NOTE — ED Notes (Signed)
Patient transported to X-ray 

## 2018-02-24 NOTE — ED Notes (Signed)
ED Provider at bedside. 

## 2018-02-24 NOTE — ED Triage Notes (Signed)
Per spouse patient is complaining of dizziness and weakness since last night.  Reports PCP performed UA and had an elevated WBC.  Reports referred to ER by PCP. BP at MD office SBP in the 90s.

## 2018-02-24 NOTE — ED Provider Notes (Signed)
Emergency Department Provider Note   I have reviewed the triage vital signs and the nursing notes.   HISTORY  Chief Complaint Hypotension   HPI Lawrence [name] is a 72 y.o. male with PMH of CAD, DM, CHF, CKD, and HLD presents to the emergency department for evaluation of lightheadedness and generalized weakness which began this morning while getting ready for the gym.  He reports being compliant with all of his medications including Eliquis and Lasix.  He was admitted in January for viral pneumonia and CHF exacerbation.  He states that his weights have been under good control and he was told by his cardiologist recently that he was actually running slightly dry and they adjusted his Lasix dose down.  Denies any fevers or chills.  No vomiting or diarrhea.  He has had some dark stools but was recently receiving an iron infusion and states this is typical of his stool color after iron infusions.  No chest pain or palpitations.  Denies shortness of breath.  He went to his PCP office today where he was found to be hypotensive with SBP in the 90s and was referred to the ED for further evaluation.   Patient has had some pain and mild swelling in the left ankle.  It was worse several days ago where he had difficulty walking on the ankle but states that the swelling is gone down.  He will have intermittent sharp pains to the foot and ankle but he has been ambulatory on the leg.    Past Medical History:  Diagnosis Date  . Anemia   . Chronic airway obstruction, not elsewhere classified   . Chronic ischemic heart disease, unspecified   . Coronary artery disease   . Diabetes mellitus    type II, neuropathy,   . Essential hypertension, benign   . Hyperlipidemia, mixed   . Nodule of kidney    incidentally found on CT abdomen 11/2012.  Pending Urology evaluation.   . Nontoxic multinodular goiter   . Obesity   . Other testicular hypofunction   . PUD (peptic ulcer disease)   . Renal insufficiency,  mild    hx of    Patient Active Problem List   Diagnosis Date Noted  . Acute on chronic diastolic CHF (congestive heart failure) ([place]) 01/03/2018  . CAP (community acquired pneumonia) 01/02/2018  . CHF (congestive heart failure), NYHA class II, chronic, diastolic ([place]) 63/78/5885  . Pacemaker 09/13/2017  . Bradycardia with 31-40 beats per minute 08/21/2017  . Paroxysmal atrial fibrillation ([place]) 06/25/2017  . Anemia, iron deficiency 12/28/2014  . Renal lesion 05/25/2014  . Morbid obesity ([place]) 03/10/2013  . COPD (chronic obstructive pulmonary disease) ([place]) 01/22/2013  . Anemia   . Coronary artery disease   . Hyperlipidemia, mixed   . Essential hypertension, benign     Past Surgical History:  Procedure Laterality Date  . CHOLECYSTECTOMY    . CORONARY ARTERY BYPASS GRAFT  2007   x 4  . ESOPHAGOGASTRODUODENOSCOPY     10/06/2012:  hiatal hernia, moderate gastritis.  2012: Colonoscopy at [facility]:  one polyp.  Due next 2017  . EYE SURGERY    . KNEE DEBRIDEMENT    . PACEMAKER IMPLANT N/A 08/22/2017   Procedure: Pacemaker Implant;  Surgeon: Constance Haw, MD;  Location: [place] [facility];  Service: Cardiovascular;  Laterality: N/A;  . SHOULDER SURGERY     left shoulder  . TONSILECTOMY, ADENOIDECTOMY, BILATERAL MYRINGOTOMY AND TUBES      Current Outpatient  Rx  . Order #: [id number] Class: Normal  . Order #: [id number] Class: Historical Med  . Order #: [id number] Class: Historical Med  . Order #: [id number] Class: Print  . Order #: [id number] Class: Historical Med  . Order #: [id number] Class: Historical Med  . Order #: [id number] Class: Historical Med  . Order #: [id number] Class: Historical Med  . Order #: [id number] Class: Historical Med  . Order #: [id number] Class: Historical Med  . Order #: [id number] Class: Historical Med  . Order #: [id number] Class: Historical Med    Allergies Bactrim [sulfamethoxazole-trimethoprim]  Family History  Problem Relation Age of Onset  . Colon cancer  Mother   . Hypertension Mother   . Kidney cancer Mother   . Cancer Mother        colon  . Esophageal cancer Father   . Stroke Father   . Cancer Father        esophageal  . Asthma Sister   . Emphysema Unknown     Social History Social History   Tobacco Use  . Smoking status: Former Smoker    Packs/day: 1.50    Years: 30.00    Pack years: 45.00    Types: Cigarettes    Last attempt to quit: 2006    Years since quitting: 13.1  . Smokeless tobacco: Former Systems developer    Types: Chew  Substance Use Topics  . Alcohol use: Yes    Comment: rare  . Drug use: No    Review of Systems  Constitutional: No fever/chills. Positive lightheadedness.  Eyes: No visual changes. ENT: No sore throat. Cardiovascular: Denies chest pain. Positive low BP. Respiratory: Denies shortness of breath. Gastrointestinal: No abdominal pain.  No nausea, no vomiting.  No diarrhea.  No constipation. Genitourinary: Negative for dysuria. Musculoskeletal: Negative for back pain. Skin: Negative for rash. Neurological: Negative for headaches, focal weakness or numbness.  10-point ROS otherwise negative.  ____________________________________________   PHYSICAL EXAM:  VITAL SIGNS: ED Triage Vitals  Enc Vitals Group     BP 02/24/18 1232 (!) 98/52     Pulse Rate 02/24/18 1231 62     Resp 02/24/18 1231 20     Temp 02/24/18 1231 98 F (36.7 C)     Temp Source 02/24/18 1231 Oral     SpO2 02/24/18 1231 98 %     Weight 02/24/18 1230 215 lb (97.5 kg)     Height 02/24/18 1230 6' (1.829 m)     Pain Score 02/24/18 1230 10   Constitutional: Alert and oriented. Well appearing and in no acute distress. Eyes: Conjunctivae are normal.  Head: Atraumatic. Nose: No congestion/rhinnorhea. Mouth/Throat: Mucous membranes are slightly dry. Oropharynx non-erythematous. Neck: No stridor.   Cardiovascular: Normal rate, regular rhythm. Good peripheral circulation. Grossly normal heart sounds.   Respiratory: Normal  respiratory effort.  No retractions. Lungs CTAB. Gastrointestinal: Soft and nontender. No distention.  Musculoskeletal: Mild left ankle swelling with trace erythema. No warmth. Patient is ambulatory without significant difficulty with normal passive and active ROM of the left ankle.  Neurologic:  Normal speech and language. No gross focal neurologic deficits are appreciated.  Skin:  Skin is warm, dry and intact. No rash noted.  ____________________________________________   LABS (all labs ordered are listed, but only abnormal results are displayed)  Labs Reviewed  COMPREHENSIVE METABOLIC PANEL - Abnormal; Notable for the following components:      Result Value   Chloride 92 (*)    Glucose, Bld 246 (*)    BUN 93 (*)    Creatinine, Ser 2.20 (*)  AST 76 (*)    ALT 67 (*)    Alkaline Phosphatase 230 (*)    GFR calc non Af Amer 28 (*)    GFR calc Af Amer 33 (*)    All other components within normal limits  CBC WITH DIFFERENTIAL/PLATELET - Abnormal; Notable for the following components:   WBC 12.7 (*)    RBC 3.48 (*)    Hemoglobin 11.0 (*)    HCT 33.4 (*)    Neutro Abs 10.7 (*)    All other components within normal limits  TROPONIN I - Abnormal; Notable for the following components:   Troponin I 0.03 (*)    All other components within normal limits  BRAIN NATRIURETIC PEPTIDE - Abnormal; Notable for the following components:   B Natriuretic Peptide 370.5 (*)    All other components within normal limits  PROTIME-INR - Abnormal; Notable for the following components:   Prothrombin Time 17.2 (*)    All other components within normal limits  URINALYSIS, ROUTINE W REFLEX MICROSCOPIC   ____________________________________________  EKG   EKG Interpretation  Date/Time:  Monday February 24 2018 13:58:15 EST Ventricular Rate:  70 PR Interval:    QRS Duration: 168 QT Interval:  453 QTC Calculation: 493 R Axis:   86 Text Interpretation:  A-V dual-paced rhythm with some inhibition No  further analysis attempted due to paced rhythm Baseline wander in lead(s) V6 Confirmed by Nanda Quinton [phone]) on 02/24/2018 2:57:34 PM       ____________________________________________  RADIOLOGY  Dg Chest 2 View  Result Date: 02/24/2018 CLINICAL DATA:  Weakness and dizziness since last night, medial LEFT ankle pain after walking to the mall, history diabetes mellitus, coronary artery disease, hypertension EXAM: CHEST  2 VIEW COMPARISON:  01/02/2018 FINDINGS: LEFT subclavian transvenous pacemaker leads project at RIGHT atrium and RIGHT ventricle unchanged. Normal size of cardiac silhouette post CABG. Atherosclerotic calcification aorta. Mediastinal contours and pulmonary vascularity normal. Mild chronic peribronchial thickening. Chronic interstitial disease changes with basilar predominance, stable. No acute infiltrate, pleural effusion or pneumothorax. Bones appear demineralized. Tiny metallic foreign body projects over the upper RIGHT chest/axilla. IMPRESSION: Chronic bronchitic and interstitial disease changes. No acute abnormalities. Electronically Signed   By: Lavonia Dana M.D.   On: 02/24/2018 13:13   Dg Ankle Complete Left  Result Date: 02/24/2018 CLINICAL DATA:  Weakness and dizziness since last night, medial LEFT ankle pain after walking to the mall EXAM: LEFT ANKLE COMPLETE - 3+ VIEW COMPARISON:  12/23/2008 FINDINGS: Osseous mineralization normal. Soft tissue swelling at LEFT ankle. Joint spaces preserved. No acute fracture, dislocation, or bone destruction. IMPRESSION: No acute osseous abnormalities. Electronically Signed   By: Lavonia Dana M.D.   On: 02/24/2018 13:11    ____________________________________________   PROCEDURES  Procedure(s) performed:   Procedures  None ____________________________________________   INITIAL IMPRESSION / ASSESSMENT AND PLAN / ED COURSE  Pertinent labs & imaging results that were available during my care of the patient were reviewed by me and  considered in my medical decision making (see chart for details).  Presents to the emergency department for evaluation of generalized weakness and lightheadedness.  He is overall well-appearing.  No focal findings on exam.  Lungs are clear.  No hypoxemia.  No tachycardia.  He does have slightly low blood pressure here but MAPS are ok.  No signs or symptoms to suggest infection.  Patient is on Eliquis and has had some dark stools but states he has had this with his iron infusions in the  past.  Plan for screening labs, chest x-ray, and plain film of the left ankle.   03:44 PM Is feeling much better after gentle IV fluids.  Blood pressures have normalized rapidly.  He is very dry clinically and I believe is over diuresed.  He does have a mild leukocytosis but no clear source of infection.  No infection symptoms at home.  Plan for decrease Lasix dose, as discussed with his cardiologist, and continue to monitor for signs or symptoms of infection.   At this time, I do not feel there is any life-threatening condition present. I have reviewed and discussed all results (EKG, imaging, lab, urine as appropriate), exam findings with patient. I have reviewed nursing notes and appropriate previous records.  I feel the patient is safe to be discharged home without further emergent workup. Discussed usual and customary return precautions. Patient and family (if present) verbalize understanding and are comfortable with this plan.  Patient will follow-up with their primary care provider. If they do not have a primary care provider, information for follow-up has been provided to them. All questions have been answered.  ____________________________________________  FINAL CLINICAL IMPRESSION(S) / ED DIAGNOSES  Final diagnoses:  Weakness  Dehydration  Hypotension, unspecified hypotension type  Acute left ankle pain     MEDICATIONS GIVEN DURING THIS VISIT:  Medications  sodium chloride 0.9 % bolus 500 mL (0 mLs  Intravenous Stopped 02/24/18 1436)    Note:  This document was prepared using Dragon voice recognition software and may include unintentional dictation errors.  Nanda Quinton, MD Emergency Medicine    Zoraida Havrilla, [facility], MD 02/24/18 1900

## 2018-02-24 NOTE — Discharge Instructions (Signed)
You were seen in the ED today with lightheadedness and low blood pressures. These improved with some IV fluids. Continue your home medications and monitor your daily weights. If you develop chest pain, difficulty breathing, fever, or worsening lightheadedness you should return to the ED immediately.

## 2018-02-26 ENCOUNTER — Ambulatory Visit (INDEPENDENT_AMBULATORY_CARE_PROVIDER_SITE_OTHER): Payer: Medicare Other | Admitting: *Deleted

## 2018-02-26 DIAGNOSIS — I480 Paroxysmal atrial fibrillation: Secondary | ICD-10-CM | POA: Diagnosis not present

## 2018-02-26 MED ORDER — AMIODARONE HCL 200 MG PO TABS: 200.0000 mg | ORAL_TABLET | Freq: Every day | ORAL | 3 refills | Status: DC

## 2018-02-26 NOTE — Progress Notes (Signed)
Remote pacemaker transmission.   

## 2018-02-27 ENCOUNTER — Encounter: Payer: Self-pay | Admitting: Cardiology

## 2018-03-04 ENCOUNTER — Ambulatory Visit (INDEPENDENT_AMBULATORY_CARE_PROVIDER_SITE_OTHER): Payer: Medicare Other | Admitting: Cardiology

## 2018-03-04 ENCOUNTER — Encounter: Payer: Self-pay | Admitting: Cardiology

## 2018-03-04 VITALS — BP 100/60 | HR 76 | Ht 72.0 in | Wt 228.0 lb

## 2018-03-04 DIAGNOSIS — I480 Paroxysmal atrial fibrillation: Secondary | ICD-10-CM

## 2018-03-04 DIAGNOSIS — I10 Essential (primary) hypertension: Secondary | ICD-10-CM | POA: Diagnosis not present

## 2018-03-04 DIAGNOSIS — Z950 Presence of cardiac pacemaker: Secondary | ICD-10-CM | POA: Diagnosis not present

## 2018-03-04 DIAGNOSIS — I2510 Atherosclerotic heart disease of native coronary artery without angina pectoris: Secondary | ICD-10-CM | POA: Diagnosis not present

## 2018-03-04 DIAGNOSIS — I5032 Chronic diastolic (congestive) heart failure: Secondary | ICD-10-CM

## 2018-03-04 LAB — CUP PACEART REMOTE DEVICE CHECK
Implantable Lead Location: 753859
Implantable Pulse Generator Implant Date: 20180830
MDC IDC LEAD IMPLANT DT: 20180830
MDC IDC LEAD IMPLANT DT: 20180830
MDC IDC LEAD LOCATION: 753860
MDC IDC SESS DTM: 20190312204856
Pulse Gen Model: 2272
Pulse Gen Serial Number: 8939815

## 2018-03-04 NOTE — Patient Instructions (Signed)
Medication Instructions:  Your physician recommends that you continue on your current medications as directed. Please refer to the Current Medication list given to you today.   Labwork: Your physician recommends that you return for lab work today: BMP, ProBNP.   Testing/Procedures: None  Follow-Up: Your physician recommends that you schedule a follow-up appointment in: 1 month.  Any Other Special Instructions Will Be Listed Below (If Applicable).     If you need a refill on your cardiac medications before your next appointment, please call your pharmacy.

## 2018-03-04 NOTE — Progress Notes (Signed)
Cardiology Office Note:    Date:  03/04/2018   ID:  Lawrence [name], DOB [birth year], MRN [id number]  PCP:  [name], MD  Cardiologist:  [name], MD    Referring MD: [name], MD   Chief Complaint  Patient presents with  . 1 month follow up  Now doing well but gaining 8 pounds  History of Present Illness:    Lawrence [name] is a 72 y.o. male he does have congestive heart failure recently he ended up going to the emergency because of weakness fatigue low blood pressure.  He was given some fluids and his diuretics were cut down from furosemide 60 mg twice daily to furosemide 40 mg twice a day he still takes Zaroxolyn 5 mg 3 times a week.  Denies having any chest pain tightness squeezing pressure burning chest not his weight gain but no other issues.  Past Medical History:  Diagnosis Date  . Anemia   . Chronic airway obstruction, not elsewhere classified   . Chronic ischemic heart disease, unspecified   . Coronary artery disease   . Diabetes mellitus    type II, neuropathy,   . Essential hypertension, benign   . Hyperlipidemia, mixed   . Nodule of kidney    incidentally found on CT abdomen 11/2012.  Pending Urology evaluation.   . Nontoxic multinodular goiter   . Obesity   . Other testicular hypofunction   . PUD (peptic ulcer disease)   . Renal insufficiency, mild    hx of    Past Surgical History:  Procedure Laterality Date  . CHOLECYSTECTOMY    . CORONARY ARTERY BYPASS GRAFT  2007   x 4  . ESOPHAGOGASTRODUODENOSCOPY     10/06/2012:  hiatal hernia, moderate gastritis.  2012: Colonoscopy at [facility]:  one polyp.  Due next 2017  . EYE SURGERY    . KNEE DEBRIDEMENT    . PACEMAKER IMPLANT N/A 08/22/2017   Procedure: Pacemaker Implant;  Surgeon: Constance Haw, MD;  Location: [place] [facility];  Service: Cardiovascular;  Laterality: N/A;  . SHOULDER SURGERY     left shoulder  . TONSILECTOMY, ADENOIDECTOMY, BILATERAL MYRINGOTOMY AND TUBES       Current Medications: Current Meds  Medication Sig  . amiodarone (PACERONE) 200 MG tablet Take 1 tablet (200 mg total) by mouth daily.  Marland Kitchen apixaban (ELIQUIS) 5 MG TABS tablet Take 5 mg by mouth 2 (two) times daily.  . furosemide (LASIX) 20 MG tablet Take 60 mg by mouth 2 (two) times daily.   Marland Kitchen glimepiride (AMARYL) 2 MG tablet Take 1 tablet (2 mg total) by mouth daily with breakfast. (Patient taking differently: Take 4 mg by mouth daily with breakfast. )  . Ipratropium-Albuterol (COMBIVENT RESPIMAT) 20-100 MCG/ACT AERS respimat Inhale 1 puff into the lungs daily as needed for wheezing.  Marland Kitchen ipratropium-albuterol (DUONEB) 0.5-2.5 (3) MG/3ML SOLN Take 3 mLs by nebulization as needed.  . metolazone (ZAROXOLYN) 5 MG tablet Take 5 mg by mouth daily. Takes once a day 3 times per week/ m-w-f  . Multiple Vitamins-Minerals (DRY EYE FORMULA PO) Place 2 drops into both eyes daily as needed.  . nebivolol (BYSTOLIC) 5 MG tablet Take 5 mg by mouth daily.  . nitroGLYCERIN (NITROSTAT) 0.4 MG SL tablet Place 0.4 mg under the tongue.  . pantoprazole (PROTONIX) 40 MG tablet Take 40 mg by mouth as needed.   . simvastatin (ZOCOR) 20 MG tablet Take 20 mg by mouth at bedtime.  Allergies:   Bactrim [sulfamethoxazole-trimethoprim]   Social History   Socioeconomic History  . Marital status: Married    Spouse name: None  . Number of children: 5  . Years of education: None  . Highest education level: None  Social Needs  . Financial resource strain: None  . Food insecurity - worry: None  . Food insecurity - inability: None  . Transportation needs - medical: None  . Transportation needs - non-medical: None  Occupational History  . Occupation: Retired    Comment: Medical illustrator; retired Chiropodist  Tobacco Use  . Smoking status: Former Smoker    Packs/day: 1.50    Years: 30.00    Pack years: 45.00    Types: Cigarettes    Last attempt to quit: 2006    Years since quitting: 13.2  . Smokeless  tobacco: Former Systems developer    Types: Chew  Substance and Sexual Activity  . Alcohol use: Yes    Comment: rare  . Drug use: No  . Sexual activity: None  Other Topics Concern  . None  Social History Narrative   ** Merged History Encounter **       Lives with wife in a one story home.  Has 6 children.  Retired Nature conservation officer.  Education: college.     Family History: The patient's family history includes Asthma in his sister; Cancer in his father and mother; Colon cancer in his mother; Emphysema in his unknown relative; Esophageal cancer in his father; Hypertension in his mother; Kidney cancer in his mother; Stroke in his father. ROS:   Please see the history of present illness.    All 14 point review of systems negative except as described per history of present illness  EKGs/Labs/Other Studies Reviewed:      Recent Labs: 01/03/2018: TSH 6.384 01/09/2018: Magnesium 2.4 01/31/2018: NT-Pro BNP 6,793 02/24/2018: ALT 67; B Natriuretic Peptide 370.5; BUN 93; Creatinine, Ser 2.20; Hemoglobin 11.0; Platelets 331; Potassium 3.9; Sodium 135  Recent Lipid Panel No results found for: CHOL, TRIG, HDL, CHOLHDL, VLDL, LDLCALC, LDLDIRECT  Physical Exam:    VS:  BP 100/60   Pulse 76   Ht 6' (1.829 m)   Wt 228 lb (103.4 kg)   SpO2 96%   BMI 30.92 kg/m     Wt Readings from Last 3 Encounters:  03/04/18 228 lb (103.4 kg)  02/24/18 215 lb (97.5 kg)  01/31/18 226 lb 6.4 oz (102.7 kg)     GEN:  Well nourished, well developed in no acute distress HEENT: Normal NECK: No JVD; No carotid bruits LYMPHATICS: No lymphadenopathy CARDIAC: RRR, no murmurs, no rubs, no gallops RESPIRATORY:  Clear to auscultation without rales, wheezing or rhonchi  ABDOMEN: Soft, non-tender, non-distended MUSCULOSKELETAL:  No edema; No deformity  SKIN: Warm and dry LOWER EXTREMITIES: no swelling NEUROLOGIC:  Alert and oriented x 3 PSYCHIATRIC:  Normal affect   ASSESSMENT:    1. CHF (congestive heart failure), NYHA class II,  chronic, diastolic ([place])   2. Coronary artery disease involving native coronary artery of native heart without angina pectoris   3. Paroxysmal atrial fibrillation (HCC)   4. Essential hypertension, benign   5. Pacemaker    PLAN:    In order of problems listed above:  1. Congestive heart failure: I will check his proBNP today as well as BMP to see if I can increase the dose of diuretic however I would be very reluctant especially in view of the fact that his creatinine is elevated. 2. Artery  disease stable continue present management. 3. Paroxysmal atrial fibrillation fibrillation appropriate medications which I will continue. 4. Pacemaker recently interrogated we will continue present management   Medication Adjustments/Labs and Tests Ordered: Current medicines are reviewed at length with the patient today.  Concerns regarding medicines are outlined above.  No orders of the defined types were placed in this encounter.  Medication changes: No orders of the defined types were placed in this encounter.   Signed, Park Liter, MD, [facility] 03/04/2018 10:11 AM    [place]

## 2018-03-05 ENCOUNTER — Telehealth: Payer: Self-pay | Admitting: *Deleted

## 2018-03-05 DIAGNOSIS — I5033 Acute on chronic diastolic (congestive) heart failure: Secondary | ICD-10-CM

## 2018-03-05 LAB — BASIC METABOLIC PANEL
BUN/Creatinine Ratio: 33 — ABNORMAL HIGH (ref 10–24)
BUN: 60 mg/dL — ABNORMAL HIGH (ref 8–27)
CHLORIDE: 99 mmol/L (ref 96–106)
CO2: 26 mmol/L (ref 20–29)
Calcium: 9.2 mg/dL (ref 8.6–10.2)
Creatinine, Ser: 1.83 mg/dL — ABNORMAL HIGH (ref 0.76–1.27)
GFR calc Af Amer: 42 mL/min/{1.73_m2} — ABNORMAL LOW (ref >59)
GFR, EST NON AFRICAN AMERICAN: 36 mL/min/{1.73_m2} — AB (ref >59)
GLUCOSE: 169 mg/dL — AB (ref 65–99)
POTASSIUM: 5.3 mmol/L — AB (ref 3.5–5.2)
SODIUM: 140 mmol/L (ref 134–144)

## 2018-03-05 LAB — PRO B NATRIURETIC PEPTIDE: NT-Pro BNP: 7996 pg/mL — ABNORMAL HIGH (ref 0–376)

## 2018-03-05 NOTE — Telephone Encounter (Signed)
Patient's wife, Silva Bandy, informed of results and instructed patient needs to take furosemide 60 mg twice daily until weight returns to baseline. Advised to go to [facility] in [place] for BMP on Monday. Phyllis verbalized understanding. No further questions.

## 2018-03-10 ENCOUNTER — Encounter (INDEPENDENT_AMBULATORY_CARE_PROVIDER_SITE_OTHER): Payer: Self-pay

## 2018-03-11 DIAGNOSIS — I10 Essential (primary) hypertension: Secondary | ICD-10-CM | POA: Diagnosis not present

## 2018-03-11 DIAGNOSIS — Z950 Presence of cardiac pacemaker: Secondary | ICD-10-CM | POA: Diagnosis not present

## 2018-03-11 DIAGNOSIS — I2510 Atherosclerotic heart disease of native coronary artery without angina pectoris: Secondary | ICD-10-CM | POA: Diagnosis not present

## 2018-03-11 DIAGNOSIS — I480 Paroxysmal atrial fibrillation: Secondary | ICD-10-CM | POA: Diagnosis not present

## 2018-03-11 DIAGNOSIS — I5032 Chronic diastolic (congestive) heart failure: Secondary | ICD-10-CM | POA: Diagnosis not present

## 2018-03-11 NOTE — Addendum Note (Signed)
Addended by: Austin Miles on: 03/11/2018 11:26 AM   Modules accepted: Orders

## 2018-03-12 LAB — BASIC METABOLIC PANEL
BUN/Creatinine Ratio: 26 — ABNORMAL HIGH (ref 10–24)
BUN: 71 mg/dL — ABNORMAL HIGH (ref 8–27)
CO2: 28 mmol/L (ref 20–29)
Calcium: 9.5 mg/dL (ref 8.6–10.2)
Chloride: 90 mmol/L — ABNORMAL LOW (ref 96–106)
Creatinine, Ser: 2.69 mg/dL — ABNORMAL HIGH (ref 0.76–1.27)
GFR calc Af Amer: 26 mL/min/{1.73_m2} — ABNORMAL LOW (ref >59)
GFR calc non Af Amer: 23 mL/min/{1.73_m2} — ABNORMAL LOW (ref >59)
GLUCOSE: 167 mg/dL — AB (ref 65–99)
POTASSIUM: 4.5 mmol/L (ref 3.5–5.2)
SODIUM: 136 mmol/L (ref 134–144)

## 2018-03-13 ENCOUNTER — Telehealth: Payer: Self-pay | Admitting: *Deleted

## 2018-03-13 DIAGNOSIS — I5033 Acute on chronic diastolic (congestive) heart failure: Secondary | ICD-10-CM

## 2018-03-13 DIAGNOSIS — I5032 Chronic diastolic (congestive) heart failure: Secondary | ICD-10-CM

## 2018-03-13 NOTE — Telephone Encounter (Signed)
Informed patient's wife, Silva Bandy, of results. Advised her to decrease furosemide 40 mg twice daily and go to [facility] in one week to recheck labs. Phyllis verbalized understanding. No further questions.

## 2018-03-13 NOTE — Telephone Encounter (Signed)
-----   Message from Park Liter, MD sent at 03/13/2018  9:21 AM EDT ----- Kidney function worst, reduce furosemide to 40 BID, chem7 in 1 week

## 2018-03-21 ENCOUNTER — Encounter: Payer: Self-pay | Admitting: Cardiology

## 2018-03-26 DIAGNOSIS — D509 Iron deficiency anemia, unspecified: Secondary | ICD-10-CM | POA: Diagnosis not present

## 2018-03-26 DIAGNOSIS — K922 Gastrointestinal hemorrhage, unspecified: Secondary | ICD-10-CM | POA: Diagnosis not present

## 2018-03-26 DIAGNOSIS — N189 Chronic kidney disease, unspecified: Secondary | ICD-10-CM | POA: Diagnosis not present

## 2018-03-26 DIAGNOSIS — D500 Iron deficiency anemia secondary to blood loss (chronic): Secondary | ICD-10-CM | POA: Diagnosis not present

## 2018-04-02 DIAGNOSIS — E1142 Type 2 diabetes mellitus with diabetic polyneuropathy: Secondary | ICD-10-CM | POA: Diagnosis not present

## 2018-04-02 DIAGNOSIS — M25562 Pain in left knee: Secondary | ICD-10-CM | POA: Diagnosis not present

## 2018-04-02 DIAGNOSIS — I10 Essential (primary) hypertension: Secondary | ICD-10-CM | POA: Diagnosis not present

## 2018-04-02 DIAGNOSIS — Z6833 Body mass index (BMI) 33.0-33.9, adult: Secondary | ICD-10-CM | POA: Diagnosis not present

## 2018-04-04 ENCOUNTER — Encounter: Payer: Self-pay | Admitting: Cardiology

## 2018-04-04 ENCOUNTER — Ambulatory Visit (INDEPENDENT_AMBULATORY_CARE_PROVIDER_SITE_OTHER): Payer: Medicare Other | Admitting: Cardiology

## 2018-04-04 VITALS — BP 128/60 | HR 64 | Ht 72.0 in | Wt 223.0 lb

## 2018-04-04 DIAGNOSIS — I2510 Atherosclerotic heart disease of native coronary artery without angina pectoris: Secondary | ICD-10-CM

## 2018-04-04 DIAGNOSIS — I5032 Chronic diastolic (congestive) heart failure: Secondary | ICD-10-CM

## 2018-04-04 DIAGNOSIS — I480 Paroxysmal atrial fibrillation: Secondary | ICD-10-CM | POA: Diagnosis not present

## 2018-04-04 MED ORDER — METOLAZONE 2.5 MG PO TABS: 2.5000 mg | ORAL_TABLET | Freq: Every day | ORAL | 2 refills | Status: DC

## 2018-04-04 NOTE — Progress Notes (Signed)
Cardiology Office Note:    Date:  04/04/2018   ID:  [name], DOB 1945/12/31, MRN [id number]  PCP:  [name], MD  Cardiologist:  [name], MD    Referring MD: [name], MD   Chief Complaint  Patient presents with  . Follow-up  Doing well  History of Present Illness:    Lawrence [name] is a 72 y.o. male with a congestive heart failure.  He reports to have some dizziness upon getting up.  His weight actually went down by 4 pounds.  Denies have any chest pain tightness squeezing pressure burning chest seems to be hemodynamically compensated may be even a little over diuresed.  Past Medical History:  Diagnosis Date  . Anemia   . Chronic airway obstruction, not elsewhere classified   . Chronic ischemic heart disease, unspecified   . Coronary artery disease   . Diabetes mellitus    type II, neuropathy,   . Essential hypertension, benign   . Hyperlipidemia, mixed   . Nodule of kidney    incidentally found on CT abdomen 11/2012.  Pending Urology evaluation.   . Nontoxic multinodular goiter   . Obesity   . Other testicular hypofunction   . PUD (peptic ulcer disease)   . Renal insufficiency, mild    hx of    Past Surgical History:  Procedure Laterality Date  . CHOLECYSTECTOMY    . CORONARY ARTERY BYPASS GRAFT  2007   x 4  . ESOPHAGOGASTRODUODENOSCOPY     10/06/2012:  hiatal hernia, moderate gastritis.  2012: Colonoscopy at [facility]:  one polyp.  Due next 2017  . EYE SURGERY    . KNEE DEBRIDEMENT    . PACEMAKER IMPLANT N/A 08/22/2017   Procedure: Pacemaker Implant;  Surgeon: Constance Haw, MD;  Location: [place] [facility];  Service: Cardiovascular;  Laterality: N/A;  . SHOULDER SURGERY     left shoulder  . TONSILECTOMY, ADENOIDECTOMY, BILATERAL MYRINGOTOMY AND TUBES      Current Medications: Current Meds  Medication Sig  . amiodarone (PACERONE) 200 MG tablet Take 1 tablet (200 mg total) by mouth daily.  Marland Kitchen apixaban (ELIQUIS) 5 MG TABS  tablet Take 5 mg by mouth 2 (two) times daily.  . furosemide (LASIX) 20 MG tablet Take 60 mg by mouth 2 (two) times daily.   Marland Kitchen glimepiride (AMARYL) 2 MG tablet Take 1 tablet (2 mg total) by mouth daily with breakfast. (Patient taking differently: Take 4 mg by mouth daily with breakfast. )  . Ipratropium-Albuterol (COMBIVENT RESPIMAT) 20-100 MCG/ACT AERS respimat Inhale 1 puff into the lungs daily as needed for wheezing.  Marland Kitchen ipratropium-albuterol (DUONEB) 0.5-2.5 (3) MG/3ML SOLN Take 3 mLs by nebulization as needed.  . metolazone (ZAROXOLYN) 5 MG tablet Take 5 mg by mouth daily. Takes once a day 3 times per week/ m-w-f  . Multiple Vitamins-Minerals (DRY EYE FORMULA PO) Place 2 drops into both eyes daily as needed.  . nebivolol (BYSTOLIC) 5 MG tablet Take 5 mg by mouth daily.  . nitroGLYCERIN (NITROSTAT) 0.4 MG SL tablet Place 0.4 mg under the tongue.  . pantoprazole (PROTONIX) 40 MG tablet Take 40 mg by mouth as needed.   . simvastatin (ZOCOR) 20 MG tablet Take 20 mg by mouth at bedtime.      Allergies:   Bactrim [sulfamethoxazole-trimethoprim]   Social History   Socioeconomic History  . Marital status: Married    Spouse name: Not on file  . Number of children: 5  .  Years of education: Not on file  . Highest education level: Not on file  Occupational History  . Occupation: Retired    Comment: Medical illustrator; retired Chiropodist  Social Needs  . Financial resource strain: Not on file  . Food insecurity:    Worry: Not on file    Inability: Not on file  . Transportation needs:    Medical: Not on file    Non-medical: Not on file  Tobacco Use  . Smoking status: Former Smoker    Packs/day: 1.50    Years: 30.00    Pack years: 45.00    Types: Cigarettes    Last attempt to quit: 2006    Years since quitting: 13.2  . Smokeless tobacco: Former Systems developer    Types: Chew  Substance and Sexual Activity  . Alcohol use: Yes    Comment: rare  . Drug use: No  . Sexual activity: Not on  file  Lifestyle  . Physical activity:    Days per week: Not on file    Minutes per session: Not on file  . Stress: Not on file  Relationships  . Social connections:    Talks on phone: Not on file    Gets together: Not on file    Attends religious service: Not on file    Active member of club or organization: Not on file    Attends meetings of clubs or organizations: Not on file    Relationship status: Not on file  Other Topics Concern  . Not on file  Social History Narrative   ** Merged History Encounter **       Lives with wife in a one story home.  Has 6 children.  Retired Nature conservation officer.  Education: college.     Family History: The patient's family history includes Asthma in his sister; Cancer in his father and mother; Colon cancer in his mother; Emphysema in his unknown relative; Esophageal cancer in his father; Hypertension in his mother; Kidney cancer in his mother; Stroke in his father. ROS:   Please see the history of present illness.    All 14 point review of systems negative except as described per history of present illness  EKGs/Labs/Other Studies Reviewed:      Recent Labs: 01/03/2018: TSH 6.384 01/09/2018: Magnesium 2.4 02/24/2018: ALT 67; B Natriuretic Peptide 370.5; Hemoglobin 11.0; Platelets 331 03/04/2018: NT-Pro BNP 7,996 03/11/2018: BUN 71; Creatinine, Ser 2.69; Potassium 4.5; Sodium 136  Recent Lipid Panel No results found for: CHOL, TRIG, HDL, CHOLHDL, VLDL, LDLCALC, LDLDIRECT  Physical Exam:    VS:  BP 128/60   Pulse 64   Ht 6' (1.829 m)   Wt 223 lb (101.2 kg)   SpO2 98%   BMI 30.24 kg/m     Wt Readings from Last 3 Encounters:  04/04/18 223 lb (101.2 kg)  03/04/18 228 lb (103.4 kg)  02/24/18 215 lb (97.5 kg)     GEN:  Well nourished, well developed in no acute distress HEENT: Normal NECK: No JVD; No carotid bruits LYMPHATICS: No lymphadenopathy CARDIAC: RRR, no murmurs, no rubs, no gallops RESPIRATORY:  Clear to auscultation without rales,  wheezing or rhonchi  ABDOMEN: Soft, non-tender, non-distended MUSCULOSKELETAL:  No edema; No deformity  SKIN: Warm and dry LOWER EXTREMITIES: no swelling NEUROLOGIC:  Alert and oriented x 3 PSYCHIATRIC:  Normal affect   ASSESSMENT:    1. CHF (congestive heart failure), NYHA class II, chronic, diastolic ([place])   2. Coronary artery disease involving native coronary artery of  native heart without angina pectoris   3. Paroxysmal atrial fibrillation (HCC)    PLAN:    In order of problems listed above:  1. Ingestive heart failure.  Ask him to reduce Zaroxolyn to 2.5 3 times a week.  Rest of the medication will be the same I see him back in 1 month 2. Coronary artery disease stable on appropriate medication which I will continue. 3. Paroxysmal atrial fibrillation: Anticoagulated his chads 2 vascular is 4.  I will continue with amiodarone.  See him back in 1 month or sooner if he has a problem   Medication Adjustments/Labs and Tests Ordered: Current medicines are reviewed at length with the patient today.  Concerns regarding medicines are outlined above.  No orders of the defined types were placed in this encounter.  Medication changes: No orders of the defined types were placed in this encounter.   Signed, Park Liter, MD, [facility] 04/04/2018 10:57 AM    [place]

## 2018-04-04 NOTE — Patient Instructions (Signed)
Medication Instructions:  Your physician has recommended you make the following change in your medication: DECREASE metolazone (zaroxolyn) 2.5 mg three times a week  Labwork: None  Testing/Procedures: None  Follow-Up: Your physician recommends that you schedule a follow-up appointment in: 1 month.   Any Other Special Instructions Will Be Listed Below (If Applicable).     If you need a refill on your cardiac medications before your next appointment, please call your pharmacy.

## 2018-04-10 DIAGNOSIS — J439 Emphysema, unspecified: Secondary | ICD-10-CM | POA: Diagnosis not present

## 2018-04-10 DIAGNOSIS — Z87891 Personal history of nicotine dependence: Secondary | ICD-10-CM | POA: Diagnosis not present

## 2018-04-10 DIAGNOSIS — I700 Atherosclerosis of aorta: Secondary | ICD-10-CM | POA: Diagnosis not present

## 2018-04-10 DIAGNOSIS — Z122 Encounter for screening for malignant neoplasm of respiratory organs: Secondary | ICD-10-CM | POA: Diagnosis not present

## 2018-04-16 DIAGNOSIS — M25562 Pain in left knee: Secondary | ICD-10-CM | POA: Diagnosis not present

## 2018-04-17 ENCOUNTER — Other Ambulatory Visit: Payer: Self-pay | Admitting: Orthopedic Surgery

## 2018-04-17 DIAGNOSIS — M25562 Pain in left knee: Secondary | ICD-10-CM

## 2018-04-21 DIAGNOSIS — D500 Iron deficiency anemia secondary to blood loss (chronic): Secondary | ICD-10-CM | POA: Diagnosis not present

## 2018-04-21 DIAGNOSIS — K922 Gastrointestinal hemorrhage, unspecified: Secondary | ICD-10-CM | POA: Diagnosis not present

## 2018-04-21 DIAGNOSIS — D649 Anemia, unspecified: Secondary | ICD-10-CM | POA: Diagnosis not present

## 2018-04-22 ENCOUNTER — Other Ambulatory Visit: Payer: Medicare Other

## 2018-04-22 DIAGNOSIS — D649 Anemia, unspecified: Secondary | ICD-10-CM | POA: Diagnosis not present

## 2018-04-23 DIAGNOSIS — Z7901 Long term (current) use of anticoagulants: Secondary | ICD-10-CM | POA: Diagnosis not present

## 2018-04-23 DIAGNOSIS — N183 Chronic kidney disease, stage 3 (moderate): Secondary | ICD-10-CM | POA: Diagnosis not present

## 2018-04-23 DIAGNOSIS — I480 Paroxysmal atrial fibrillation: Secondary | ICD-10-CM | POA: Diagnosis not present

## 2018-04-23 DIAGNOSIS — E1122 Type 2 diabetes mellitus with diabetic chronic kidney disease: Secondary | ICD-10-CM | POA: Diagnosis not present

## 2018-04-23 DIAGNOSIS — D631 Anemia in chronic kidney disease: Secondary | ICD-10-CM | POA: Diagnosis not present

## 2018-04-23 DIAGNOSIS — I129 Hypertensive chronic kidney disease with stage 1 through stage 4 chronic kidney disease, or unspecified chronic kidney disease: Secondary | ICD-10-CM | POA: Diagnosis not present

## 2018-04-23 DIAGNOSIS — Z950 Presence of cardiac pacemaker: Secondary | ICD-10-CM | POA: Diagnosis not present

## 2018-04-23 DIAGNOSIS — N179 Acute kidney failure, unspecified: Secondary | ICD-10-CM | POA: Diagnosis not present

## 2018-04-23 DIAGNOSIS — K922 Gastrointestinal hemorrhage, unspecified: Secondary | ICD-10-CM | POA: Diagnosis not present

## 2018-04-23 DIAGNOSIS — I509 Heart failure, unspecified: Secondary | ICD-10-CM | POA: Diagnosis not present

## 2018-04-28 DIAGNOSIS — D500 Iron deficiency anemia secondary to blood loss (chronic): Secondary | ICD-10-CM | POA: Diagnosis not present

## 2018-04-29 ENCOUNTER — Ambulatory Visit
Admission: RE | Admit: 2018-04-29 | Discharge: 2018-04-29 | Disposition: A | Discharge status: Home / Self Care | Payer: Medicare Other | Source: Ambulatory Visit | Attending: Orthopedic Surgery | Admitting: Orthopedic Surgery

## 2018-04-29 DIAGNOSIS — M25562 Pain in left knee: Secondary | ICD-10-CM

## 2018-04-29 DIAGNOSIS — M1712 Unilateral primary osteoarthritis, left knee: Secondary | ICD-10-CM | POA: Diagnosis not present

## 2018-05-02 ENCOUNTER — Other Ambulatory Visit: Payer: Self-pay

## 2018-05-02 DIAGNOSIS — M79605 Pain in left leg: Secondary | ICD-10-CM

## 2018-05-06 DIAGNOSIS — M25562 Pain in left knee: Secondary | ICD-10-CM | POA: Diagnosis not present

## 2018-05-08 DIAGNOSIS — F4310 Post-traumatic stress disorder, unspecified: Secondary | ICD-10-CM | POA: Insufficient documentation

## 2018-05-08 DIAGNOSIS — H259 Unspecified age-related cataract: Secondary | ICD-10-CM

## 2018-05-08 DIAGNOSIS — H903 Sensorineural hearing loss, bilateral: Secondary | ICD-10-CM

## 2018-05-08 DIAGNOSIS — E782 Mixed hyperlipidemia: Secondary | ICD-10-CM | POA: Insufficient documentation

## 2018-05-08 DIAGNOSIS — I10 Essential (primary) hypertension: Secondary | ICD-10-CM

## 2018-05-08 DIAGNOSIS — M47812 Spondylosis without myelopathy or radiculopathy, cervical region: Secondary | ICD-10-CM | POA: Insufficient documentation

## 2018-05-08 DIAGNOSIS — H2511 Age-related nuclear cataract, right eye: Secondary | ICD-10-CM | POA: Insufficient documentation

## 2018-05-08 DIAGNOSIS — E291 Testicular hypofunction: Secondary | ICD-10-CM | POA: Insufficient documentation

## 2018-05-08 DIAGNOSIS — I509 Heart failure, unspecified: Secondary | ICD-10-CM

## 2018-05-08 DIAGNOSIS — K219 Gastro-esophageal reflux disease without esophagitis: Secondary | ICD-10-CM

## 2018-05-08 DIAGNOSIS — H9313 Tinnitus, bilateral: Secondary | ICD-10-CM

## 2018-05-08 DIAGNOSIS — M109 Gout, unspecified: Secondary | ICD-10-CM

## 2018-05-08 DIAGNOSIS — E1140 Type 2 diabetes mellitus with diabetic neuropathy, unspecified: Secondary | ICD-10-CM | POA: Insufficient documentation

## 2018-05-08 DIAGNOSIS — N182 Chronic kidney disease, stage 2 (mild): Secondary | ICD-10-CM | POA: Insufficient documentation

## 2018-05-08 HISTORY — DX: Sensorineural hearing loss, bilateral: H90.3

## 2018-05-08 HISTORY — DX: Age-related nuclear cataract, right eye: H25.11

## 2018-05-08 HISTORY — DX: Heart failure, unspecified: I50.9

## 2018-05-08 HISTORY — DX: Chronic kidney disease, stage 2 (mild): N18.2

## 2018-05-08 HISTORY — DX: Testicular hypofunction: E29.1

## 2018-05-08 HISTORY — DX: Gout, unspecified: M10.9

## 2018-05-08 HISTORY — DX: Tinnitus, bilateral: H93.13

## 2018-05-08 HISTORY — DX: Post-traumatic stress disorder, unspecified: F43.10

## 2018-05-08 HISTORY — DX: Type 2 diabetes mellitus with diabetic neuropathy, unspecified: E11.40

## 2018-05-08 HISTORY — DX: Spondylosis without myelopathy or radiculopathy, cervical region: M47.812

## 2018-05-08 HISTORY — DX: Essential (primary) hypertension: I10

## 2018-05-08 HISTORY — DX: Mixed hyperlipidemia: E78.2

## 2018-05-08 HISTORY — DX: Gastro-esophageal reflux disease without esophagitis: K21.9

## 2018-05-08 HISTORY — DX: Unspecified age-related cataract: H25.9

## 2018-05-09 ENCOUNTER — Encounter: Payer: Self-pay | Admitting: Gastroenterology

## 2018-05-16 ENCOUNTER — Encounter: Payer: Self-pay | Admitting: Cardiology

## 2018-05-16 ENCOUNTER — Ambulatory Visit (INDEPENDENT_AMBULATORY_CARE_PROVIDER_SITE_OTHER): Payer: Medicare Other | Admitting: Cardiology

## 2018-05-16 VITALS — BP 154/66 | HR 66 | Ht 72.0 in | Wt 233.0 lb

## 2018-05-16 DIAGNOSIS — I480 Paroxysmal atrial fibrillation: Secondary | ICD-10-CM

## 2018-05-16 DIAGNOSIS — Z950 Presence of cardiac pacemaker: Secondary | ICD-10-CM | POA: Diagnosis not present

## 2018-05-16 DIAGNOSIS — I5032 Chronic diastolic (congestive) heart failure: Secondary | ICD-10-CM | POA: Diagnosis not present

## 2018-05-16 DIAGNOSIS — I2510 Atherosclerotic heart disease of native coronary artery without angina pectoris: Secondary | ICD-10-CM | POA: Diagnosis not present

## 2018-05-16 DIAGNOSIS — I10 Essential (primary) hypertension: Secondary | ICD-10-CM

## 2018-05-16 NOTE — Progress Notes (Signed)
Cardiology Office Note:    Date:  05/16/2018   ID:  Lawrence [name], DOB [birth year], MRN [id number]  PCP:  [name], MD  Cardiologist:  [name], MD    Referring MD: [name], MD   Chief Complaint  Patient presents with  . 1 month follow up  Been very well  History of Present Illness:    Lawrence [name] is a 72 y.o. male with coronary artery disease diastolic/systolic congestive heart failure.  Overall Lawrence [name] is doing very well.  He is to have more energy but no shortness of breath no swelling of lower extremities.  He did have some abdominal pain he seen vascular specialist who recommended no surgery.  He also sustained some injury to his left knee he does  use crutches.  Past Medical History:  Diagnosis Date  . Anemia   . Chronic airway obstruction, not elsewhere classified   . Chronic ischemic heart disease, unspecified   . Coronary artery disease   . Diabetes mellitus    type II, neuropathy,   . Essential hypertension, benign   . Hyperlipidemia, mixed   . Nodule of kidney    incidentally found on CT abdomen 11/2012.  Pending Urology evaluation.   . Nontoxic multinodular goiter   . Obesity   . Other testicular hypofunction   . PUD (peptic ulcer disease)   . Renal insufficiency, mild    hx of    Past Surgical History:  Procedure Laterality Date  . CHOLECYSTECTOMY    . CORONARY ARTERY BYPASS GRAFT  2007   x 4  . ESOPHAGOGASTRODUODENOSCOPY     10/06/2012:  hiatal hernia, moderate gastritis.  2012: Colonoscopy at [facility]:  one polyp.  Due next 2017  . EYE SURGERY    . KNEE DEBRIDEMENT    . PACEMAKER IMPLANT N/A 08/22/2017   Procedure: Pacemaker Implant;  Surgeon: Constance Haw, MD;  Location: [place] [facility];  Service: Cardiovascular;  Laterality: N/A;  . SHOULDER SURGERY     left shoulder  . TONSILECTOMY, ADENOIDECTOMY, BILATERAL MYRINGOTOMY AND TUBES      Current Medications: Current Meds  Medication Sig  . amiodarone  (PACERONE) 200 MG tablet Take 1 tablet (200 mg total) by mouth daily.  Marland Kitchen apixaban (ELIQUIS) 5 MG TABS tablet Take 5 mg by mouth 2 (two) times daily.  . furosemide (LASIX) 40 MG tablet Take 40 mg by mouth 2 (two) times daily.   . Ipratropium-Albuterol (COMBIVENT RESPIMAT) 20-100 MCG/ACT AERS respimat Inhale 1 puff into the lungs daily as needed for wheezing.  Marland Kitchen ipratropium-albuterol (DUONEB) 0.5-2.5 (3) MG/3ML SOLN Take 3 mLs by nebulization as needed.  . metolazone (ZAROXOLYN) 2.5 MG tablet Take 1 tablet (2.5 mg total) by mouth daily. Takes once a day 3 times per week/ m-w-f  . Multiple Vitamins-Minerals (DRY EYE FORMULA PO) Place 2 drops into both eyes daily as needed.  . nebivolol (BYSTOLIC) 5 MG tablet Take 5 mg by mouth as needed.   . nitroGLYCERIN (NITROSTAT) 0.4 MG SL tablet Place 0.4 mg under the tongue.  . pantoprazole (PROTONIX) 40 MG tablet Take 40 mg by mouth as needed.   . simvastatin (ZOCOR) 20 MG tablet Take 20 mg by mouth at bedtime.      Allergies:   Bactrim [sulfamethoxazole-trimethoprim]   Social History   Socioeconomic History  . Marital status: Married    Spouse name: Not on file  . Number of children: 5  . Years of education: Not  on file  . Highest education level: Not on file  Occupational History  . Occupation: Retired    Comment: Medical illustrator; retired Chiropodist  Social Needs  . Financial resource strain: Not on file  . Food insecurity:    Worry: Not on file    Inability: Not on file  . Transportation needs:    Medical: Not on file    Non-medical: Not on file  Tobacco Use  . Smoking status: Former Smoker    Packs/day: 1.50    Years: 30.00    Pack years: 45.00    Types: Cigarettes    Last attempt to quit: 2006    Years since quitting: 13.4  . Smokeless tobacco: Former Systems developer    Types: Chew  Substance and Sexual Activity  . Alcohol use: Yes    Comment: rare  . Drug use: No  . Sexual activity: Not on file  Lifestyle  . Physical activity:     Days per week: Not on file    Minutes per session: Not on file  . Stress: Not on file  Relationships  . Social connections:    Talks on phone: Not on file    Gets together: Not on file    Attends religious service: Not on file    Active member of club or organization: Not on file    Attends meetings of clubs or organizations: Not on file    Relationship status: Not on file  Other Topics Concern  . Not on file  Social History Narrative   ** Merged History Encounter **       Lives with wife in a one story home.  Has 6 children.  Retired Nature conservation officer.  Education: college.     Family History: The patient's family history includes Asthma in his sister; Cancer in his father and mother; Colon cancer in his mother; Emphysema in his unknown relative; Esophageal cancer in his father; Hypertension in his mother; Kidney cancer in his mother; Stroke in his father. ROS:   Please see the history of present illness.    All 14 point review of systems negative except as described per history of present illness  EKGs/Labs/Other Studies Reviewed:      Recent Labs: 01/03/2018: TSH 6.384 01/09/2018: Magnesium 2.4 02/24/2018: ALT 67; B Natriuretic Peptide 370.5; Hemoglobin 11.0; Platelets 331 03/04/2018: NT-Pro BNP 7,996 03/11/2018: BUN 71; Creatinine, Ser 2.69; Potassium 4.5; Sodium 136  Recent Lipid Panel No results found for: CHOL, TRIG, HDL, CHOLHDL, VLDL, LDLCALC, LDLDIRECT  Physical Exam:    VS:  BP (!) 154/66   Pulse 66   Ht 6' (1.829 m)   Wt 233 lb (105.7 kg)   SpO2 96%   BMI 31.60 kg/m     Wt Readings from Last 3 Encounters:  05/16/18 233 lb (105.7 kg)  04/04/18 223 lb (101.2 kg)  03/04/18 228 lb (103.4 kg)     GEN:  Well nourished, well developed in no acute distress HEENT: Normal NECK: No JVD; No carotid bruits LYMPHATICS: No lymphadenopathy CARDIAC: RRR, no murmurs, no rubs, no gallops RESPIRATORY:  Clear to auscultation without rales, wheezing or rhonchi  ABDOMEN: Soft,  non-tender, non-distended MUSCULOSKELETAL:  No edema; No deformity  SKIN: Warm and dry LOWER EXTREMITIES: no swelling NEUROLOGIC:  Alert and oriented x 3 PSYCHIATRIC:  Normal affect   ASSESSMENT:    1. CHF (congestive heart failure), NYHA class II, chronic, diastolic ([place])   2. Coronary artery disease involving native coronary artery of native heart without  angina pectoris   3. Essential hypertension, benign   4. Paroxysmal atrial fibrillation (HCC)   5. Pacemaker    PLAN:    In order of problems listed above:  1. Congestive heart failure compensated I will check his Chem-7 today.  proBNP will be drawn as well.  I will not modified any therapy for now. 2. Coronary artery disease: Stable on appropriate medications which I will continue. 3. Essential hypertension: Blood pressure well controlled continue present management 4. Paroxysmal atrial fibrillation: Maintaining sinus rhythm anticoagulated. 5. Pacemaker noted stable.  She is doing well will check Chem-7 and proBNP see him back in 2 months   Medication Adjustments/Labs and Tests Ordered: Current medicines are reviewed at length with the patient today.  Concerns regarding medicines are outlined above.  No orders of the defined types were placed in this encounter.  Medication changes: No orders of the defined types were placed in this encounter.   Signed, Park Liter, MD, [facility] 05/16/2018 10:45 AM    [place]

## 2018-05-16 NOTE — Patient Instructions (Signed)
Medication Instructions:  Your physician recommends that you continue on your current medications as directed. Please refer to the Current Medication list given to you today.   Labwork: Your physician recommends that you return for lab work today: BMP, Pro-BNP.   Testing/Procedures: None  Follow-Up: Your physician recommends that you schedule a follow-up appointment in: 2 months.  If you need a refill on your cardiac medications before your next appointment, please call your pharmacy.   Thank you for choosing [facility]! Robyne Peers, RN [phone]

## 2018-05-17 LAB — BASIC METABOLIC PANEL
BUN / CREAT RATIO: 30 — AB (ref 10–24)
BUN: 59 mg/dL — ABNORMAL HIGH (ref 8–27)
CO2: 28 mmol/L (ref 20–29)
Calcium: 9.5 mg/dL (ref 8.6–10.2)
Chloride: 95 mmol/L — ABNORMAL LOW (ref 96–106)
Creatinine, Ser: 1.99 mg/dL — ABNORMAL HIGH (ref 0.76–1.27)
GFR, EST AFRICAN AMERICAN: 38 mL/min/{1.73_m2} — AB (ref >59)
GFR, EST NON AFRICAN AMERICAN: 33 mL/min/{1.73_m2} — AB (ref >59)
Glucose: 361 mg/dL — ABNORMAL HIGH (ref 65–99)
POTASSIUM: 5.5 mmol/L — AB (ref 3.5–5.2)
SODIUM: 137 mmol/L (ref 134–144)

## 2018-05-17 LAB — PRO B NATRIURETIC PEPTIDE: NT-Pro BNP: 3272 pg/mL — ABNORMAL HIGH (ref 0–376)

## 2018-05-20 ENCOUNTER — Encounter (INDEPENDENT_AMBULATORY_CARE_PROVIDER_SITE_OTHER): Payer: Self-pay

## 2018-05-26 ENCOUNTER — Encounter: Payer: Self-pay | Admitting: Cardiology

## 2018-05-26 ENCOUNTER — Ambulatory Visit (INDEPENDENT_AMBULATORY_CARE_PROVIDER_SITE_OTHER): Payer: Medicare Other | Admitting: Cardiology

## 2018-05-26 VITALS — BP 110/56 | HR 79 | Ht 72.0 in | Wt 220.0 lb

## 2018-05-26 DIAGNOSIS — Z950 Presence of cardiac pacemaker: Secondary | ICD-10-CM

## 2018-05-26 DIAGNOSIS — I2510 Atherosclerotic heart disease of native coronary artery without angina pectoris: Secondary | ICD-10-CM | POA: Diagnosis not present

## 2018-05-26 DIAGNOSIS — I10 Essential (primary) hypertension: Secondary | ICD-10-CM

## 2018-05-26 DIAGNOSIS — I495 Sick sinus syndrome: Secondary | ICD-10-CM | POA: Diagnosis not present

## 2018-05-26 DIAGNOSIS — I480 Paroxysmal atrial fibrillation: Secondary | ICD-10-CM

## 2018-05-26 DIAGNOSIS — E785 Hyperlipidemia, unspecified: Secondary | ICD-10-CM

## 2018-05-26 DIAGNOSIS — Z01812 Encounter for preprocedural laboratory examination: Secondary | ICD-10-CM | POA: Diagnosis not present

## 2018-05-26 DIAGNOSIS — I471 Supraventricular tachycardia: Secondary | ICD-10-CM

## 2018-05-26 LAB — CUP PACEART INCLINIC DEVICE CHECK
Battery Remaining Longevity: 102 mo
Brady Statistic RA Percent Paced: 62 %
Date Time Interrogation Session: 20190603143158
Implantable Lead Implant Date: 20180830
Implantable Lead Implant Date: 20180830
Implantable Lead Location: 753859
Lead Channel Impedance Value: 437.5 Ohm
Lead Channel Pacing Threshold Amplitude: 0.75 V
Lead Channel Pacing Threshold Amplitude: 0.75 V
Lead Channel Pacing Threshold Pulse Width: 0.5 ms
Lead Channel Pacing Threshold Pulse Width: 0.5 ms
Lead Channel Pacing Threshold Pulse Width: 0.5 ms
Lead Channel Sensing Intrinsic Amplitude: 2.1 mV
Lead Channel Setting Pacing Amplitude: 1.75 V
Lead Channel Setting Pacing Amplitude: 2.5 V
Lead Channel Setting Pacing Pulse Width: 0.5 ms
Lead Channel Setting Sensing Sensitivity: 2 mV
MDC IDC LEAD LOCATION: 753860
MDC IDC MSMT BATTERY VOLTAGE: 3.01 V
MDC IDC MSMT LEADCHNL RA PACING THRESHOLD AMPLITUDE: 0.75 V
MDC IDC MSMT LEADCHNL RV IMPEDANCE VALUE: 525 Ohm
MDC IDC MSMT LEADCHNL RV SENSING INTR AMPL: 12 mV
MDC IDC PG IMPLANT DT: 20180830
MDC IDC STAT BRADY RV PERCENT PACED: 41 %
Pulse Gen Model: 2272
Pulse Gen Serial Number: 8939815

## 2018-05-26 NOTE — H&P (View-Only) (Signed)
Electrophysiology Office Note   Date:  05/26/2018   ID:  Lawrence [name], DOB [birth year], MRN [id number]  PCP:  [name], MD  Cardiologist:  [name] Primary Electrophysiologist:  Harlow Basley Meredith Leeds, MD    Chief Complaint  Patient presents with  . Pacemaker Check    PAF/Sick sinus syndrome     History of Present Illness: Lawrence [name] is a 72 y.o. male who is being seen today for the evaluation of sick sinus syndrome at the request of [name], MD. Presenting today for electrophysiology evaluation.  He has a history of coronary artery disease, paroxysmal atrial fibrillation, hypertension, hyperlipidemia, and chronic renal failure.  He presented to the hospital  in October 2018 and had a [place] dual-chamber pacemaker implanted 08/22/17.  Today, denies symptoms of palpitations, chest pain, shortness of breath, orthopnea, PND, lower extremity edema, claudication, dizziness, presyncope, syncope, bleeding, or neurologic sequela. The patient is tolerating medications without difficulties.  Overall, he is doing well.  He does complain of weakness and fatigue though.  Device interrogation shows that he is in a likely atrial tachycardia.   Past Medical History:  Diagnosis Date  . Anemia   . Chronic airway obstruction, not elsewhere classified   . Chronic ischemic heart disease, unspecified   . Coronary artery disease   . Diabetes mellitus    type II, neuropathy,   . Essential hypertension, benign   . Hyperlipidemia, mixed   . Nodule of kidney    incidentally found on CT abdomen 11/2012.  Pending Urology evaluation.   . Nontoxic multinodular goiter   . Obesity   . Other testicular hypofunction   . PUD (peptic ulcer disease)   . Renal insufficiency, mild    hx of   Past Surgical History:  Procedure Laterality Date  . CHOLECYSTECTOMY    . CORONARY ARTERY BYPASS GRAFT  2007   x 4  . ESOPHAGOGASTRODUODENOSCOPY     10/06/2012:  hiatal hernia, moderate  gastritis.  2012: Colonoscopy at [facility]:  one polyp.  Due next 2017  . EYE SURGERY    . KNEE DEBRIDEMENT    . PACEMAKER IMPLANT N/A 08/22/2017   Procedure: Pacemaker Implant;  Surgeon: Constance Haw, MD;  Location: [place] [facility];  Service: Cardiovascular;  Laterality: N/A;  . SHOULDER SURGERY     left shoulder  . TONSILECTOMY, ADENOIDECTOMY, BILATERAL MYRINGOTOMY AND TUBES       Current Outpatient Medications  Medication Sig Dispense Refill  . apixaban (ELIQUIS) 5 MG TABS tablet Take 5 mg by mouth 2 (two) times daily.    . furosemide (LASIX) 40 MG tablet Take 40 mg by mouth 2 (two) times daily.     . Ipratropium-Albuterol (COMBIVENT RESPIMAT) 20-100 MCG/ACT AERS respimat Inhale 1 puff into the lungs daily as needed for wheezing.    Marland Kitchen ipratropium-albuterol (DUONEB) 0.5-2.5 (3) MG/3ML SOLN Take 3 mLs by nebulization as needed.    . metolazone (ZAROXOLYN) 2.5 MG tablet Take 1 tablet (2.5 mg total) by mouth daily. Takes once a day 3 times per week/ m-w-f 30 tablet 2  . Multiple Vitamins-Minerals (DRY EYE FORMULA PO) Place 2 drops into both eyes daily as needed.    . nitroGLYCERIN (NITROSTAT) 0.4 MG SL tablet Place 0.4 mg under the tongue.    . pantoprazole (PROTONIX) 40 MG tablet Take 40 mg by mouth as needed.     . simvastatin (ZOCOR) 20 MG tablet Take 20 mg by mouth at bedtime.  No current facility-administered medications for this visit.     Allergies:   Bactrim [sulfamethoxazole-trimethoprim]   Social History:  The patient  reports that he quit smoking about 13 years ago. His smoking use included cigarettes. He has a 45.00 pack-year smoking history. He has quit using smokeless tobacco. His smokeless tobacco use included chew. He reports that he drinks alcohol. He reports that he does not use drugs.   Family History:  The patient's family history includes Asthma in his sister; Cancer in his father and mother; Colon cancer in his mother; Emphysema in his unknown relative;  Esophageal cancer in his father; Hypertension in his mother; Kidney cancer in his mother; Stroke in his father.   ROS:  Please see the history of present illness.   Otherwise, review of systems is positive for fatigue, snoring, balance problems, dizziness, bleeding.   All other systems are reviewed and negative.   PHYSICAL EXAM: VS:  BP (!) 110/56   Pulse 79   Ht 6' (1.829 m)   Wt 220 lb (99.8 kg)   SpO2 96%   BMI 29.84 kg/m  , BMI Body mass index is 29.84 kg/m. GEN: Well nourished, well developed, in no acute distress  HEENT: normal  Neck: no JVD, carotid bruits, or masses Cardiac: RRR; no murmurs, rubs, or gallops,no edema  Respiratory:  clear to auscultation bilaterally, normal work of breathing GI: soft, nontender, nondistended, + BS MS: no deformity or atrophy  Skin: warm and dry, device site well healed Neuro:  Strength and sensation are intact Psych: euthymic mood, full affect  EKG:  EKG is ordered today. Personal review of the ekg ordered shows sinus rhythm, first-degree AV block, PVCs, prolonged QTC, rate 79  Personal review of the device interrogation today. Results in [place]: 01/03/2018: TSH 6.384 01/09/2018: Magnesium 2.4 02/24/2018: ALT 67; B Natriuretic Peptide 370.5; Hemoglobin 11.0; Platelets 331 05/16/2018: BUN 59; Creatinine, Ser 1.99; NT-Pro BNP 3,272; Potassium 5.5; Sodium 137    Lipid Panel  No results found for: CHOL, TRIG, HDL, CHOLHDL, VLDL, LDLCALC, LDLDIRECT   Wt Readings from Last 3 Encounters:  05/26/18 220 lb (99.8 kg)  05/16/18 233 lb (105.7 kg)  04/04/18 223 lb (101.2 kg)      Other studies Reviewed: Additional studies/ records that were reviewed today include: TTE 09/17/17  Review of the above records today demonstrates:  - Left ventricle: The cavity size was mildly dilated. Wall   thickness was normal. Systolic function was normal. The estimated   ejection fraction was in the range of 55% to 60%. Regional wall   motion  abnormalities cannot be excluded. The study is not   technically sufficient to allow evaluation of LV diastolic   function. - Mitral valve: Calcified annulus. There was mild regurgitation. - Right ventricle: The cavity size was moderately dilated. - Tricuspid valve: There was moderate regurgitation. - Pulmonary arteries: PA peak pressure: 32 mm Hg (S).   ASSESSMENT AND PLAN:  1.  Sick sinus syndrome: This post [place] dual-chamber pacemaker.  Device is functioning appropriately.  We have changed his mode switch to 130 bpm.  2.  Paroxysmal atrial fibrillation: On amiodarone and Eliquis.  He has missed no doses of his Eliquis.  No atrial fibrillation on his device.  Unfortunately his LFTs of gone up and thus we need to stop his amiodarone.  This patients CHA2DS2-VASc Score and unadjusted Ischemic Stroke Rate (% per year) is equal to 4.8 % stroke rate/year from a score  of 4  Above score calculated as 1 point each if present [CHF, HTN, DM, Vascular=MI/PAD/Aortic Plaque, Age if 65-74, or Male] Above score calculated as 2 points each if present [Age > 75, or Stroke/TIA/TE]  3.  Coronary artery disease: No current chest pain.  4.  Hypertension: Blood pressure well controlled.  5.  Hyperlipidemia: Continue statin  6.  Atrial tachycardia: In atrial tachycardia based on device interrogation.  An attempt was made in clinic to pace him out of his tachycardia which did not occur.  He is symptomatic with weakness and fatigue.  We Cherlyn Syring plan for cardioversion.  Current medicines are reviewed at length with the patient today.   The patient does not have concerns regarding his medicines.  The following changes were made today:  Stop amiodarone  Labs/ tests ordered today include:  Orders Placed This Encounter  Procedures  . EKG 12-Lead     Disposition:   FU with Christen Bedoya 3 months  Signed, Graham Doukas Meredith Leeds, MD  05/26/2018 1:46 PM     [address] [place]  [id number] [phone] (office) [phone] (fax)

## 2018-05-26 NOTE — Progress Notes (Signed)
Electrophysiology Office Note   Date:  05/26/2018   ID:  Lawrence [name], DOB Feb 07, 1946, MRN [id number]  PCP:  [name], MD  Cardiologist:  [name] Primary Electrophysiologist:  Will Meredith Leeds, MD    Chief Complaint  Patient presents with  . Pacemaker Check    PAF/Sick sinus syndrome     History of Present Illness: Lawrence [name] is a 72 y.o. male who is being seen today for the evaluation of sick sinus syndrome at the request of [name], MD. Presenting today for electrophysiology evaluation.  He has a history of coronary artery disease, paroxysmal atrial fibrillation, hypertension, hyperlipidemia, and chronic renal failure.  He presented to the hospital  in October 2018 and had a [place] dual-chamber pacemaker implanted 08/22/17.  Today, denies symptoms of palpitations, chest pain, shortness of breath, orthopnea, PND, lower extremity edema, claudication, dizziness, presyncope, syncope, bleeding, or neurologic sequela. The patient is tolerating medications without difficulties.  Overall, he is doing well.  He does complain of weakness and fatigue though.  Device interrogation shows that he is in a likely atrial tachycardia.   Past Medical History:  Diagnosis Date  . Anemia   . Chronic airway obstruction, not elsewhere classified   . Chronic ischemic heart disease, unspecified   . Coronary artery disease   . Diabetes mellitus    type II, neuropathy,   . Essential hypertension, benign   . Hyperlipidemia, mixed   . Nodule of kidney    incidentally found on CT abdomen 11/2012.  Pending Urology evaluation.   . Nontoxic multinodular goiter   . Obesity   . Other testicular hypofunction   . PUD (peptic ulcer disease)   . Renal insufficiency, mild    hx of   Past Surgical History:  Procedure Laterality Date  . CHOLECYSTECTOMY    . CORONARY ARTERY BYPASS GRAFT  2007   x 4  . ESOPHAGOGASTRODUODENOSCOPY     10/06/2012:  hiatal hernia, moderate  gastritis.  2012: Colonoscopy at [facility]:  one polyp.  Due next 2017  . EYE SURGERY    . KNEE DEBRIDEMENT    . PACEMAKER IMPLANT N/A 08/22/2017   Procedure: Pacemaker Implant;  Surgeon: Constance Haw, MD;  Location: [place] [facility];  Service: Cardiovascular;  Laterality: N/A;  . SHOULDER SURGERY     left shoulder  . TONSILECTOMY, ADENOIDECTOMY, BILATERAL MYRINGOTOMY AND TUBES       Current Outpatient Medications  Medication Sig Dispense Refill  . apixaban (ELIQUIS) 5 MG TABS tablet Take 5 mg by mouth 2 (two) times daily.    . furosemide (LASIX) 40 MG tablet Take 40 mg by mouth 2 (two) times daily.     . Ipratropium-Albuterol (COMBIVENT RESPIMAT) 20-100 MCG/ACT AERS respimat Inhale 1 puff into the lungs daily as needed for wheezing.    Marland Kitchen ipratropium-albuterol (DUONEB) 0.5-2.5 (3) MG/3ML SOLN Take 3 mLs by nebulization as needed.    . metolazone (ZAROXOLYN) 2.5 MG tablet Take 1 tablet (2.5 mg total) by mouth daily. Takes once a day 3 times per week/ m-w-f 30 tablet 2  . Multiple Vitamins-Minerals (DRY EYE FORMULA PO) Place 2 drops into both eyes daily as needed.    . nitroGLYCERIN (NITROSTAT) 0.4 MG SL tablet Place 0.4 mg under the tongue.    . pantoprazole (PROTONIX) 40 MG tablet Take 40 mg by mouth as needed.     . simvastatin (ZOCOR) 20 MG tablet Take 20 mg by mouth at bedtime.  No current facility-administered medications for this visit.     Allergies:   Bactrim [sulfamethoxazole-trimethoprim]   Social History:  The patient  reports that he quit smoking about 13 years ago. His smoking use included cigarettes. He has a 45.00 pack-year smoking history. He has quit using smokeless tobacco. His smokeless tobacco use included chew. He reports that he drinks alcohol. He reports that he does not use drugs.   Family History:  The patient's family history includes Asthma in his sister; Cancer in his father and mother; Colon cancer in his mother; Emphysema in his unknown relative;  Esophageal cancer in his father; Hypertension in his mother; Kidney cancer in his mother; Stroke in his father.   ROS:  Please see the history of present illness.   Otherwise, review of systems is positive for fatigue, snoring, balance problems, dizziness, bleeding.   All other systems are reviewed and negative.   PHYSICAL EXAM: VS:  BP (!) 110/56   Pulse 79   Ht 6' (1.829 m)   Wt 220 lb (99.8 kg)   SpO2 96%   BMI 29.84 kg/m  , BMI Body mass index is 29.84 kg/m. GEN: Well nourished, well developed, in no acute distress  HEENT: normal  Neck: no JVD, carotid bruits, or masses Cardiac: RRR; no murmurs, rubs, or gallops,no edema  Respiratory:  clear to auscultation bilaterally, normal work of breathing GI: soft, nontender, nondistended, + BS MS: no deformity or atrophy  Skin: warm and dry, device site well healed Neuro:  Strength and sensation are intact Psych: euthymic mood, full affect  EKG:  EKG is ordered today. Personal review of the ekg ordered shows sinus rhythm, first-degree AV block, PVCs, prolonged QTC, rate 79  Personal review of the device interrogation today. Results in [place]: 01/03/2018: TSH 6.384 01/09/2018: Magnesium 2.4 02/24/2018: ALT 67; B Natriuretic Peptide 370.5; Hemoglobin 11.0; Platelets 331 05/16/2018: BUN 59; Creatinine, Ser 1.99; NT-Pro BNP 3,272; Potassium 5.5; Sodium 137    Lipid Panel  No results found for: CHOL, TRIG, HDL, CHOLHDL, VLDL, LDLCALC, LDLDIRECT   Wt Readings from Last 3 Encounters:  05/26/18 220 lb (99.8 kg)  05/16/18 233 lb (105.7 kg)  04/04/18 223 lb (101.2 kg)      Other studies Reviewed: Additional studies/ records that were reviewed today include: TTE 09/17/17  Review of the above records today demonstrates:  - Left ventricle: The cavity size was mildly dilated. Wall   thickness was normal. Systolic function was normal. The estimated   ejection fraction was in the range of 55% to 60%. Regional wall   motion  abnormalities cannot be excluded. The study is not   technically sufficient to allow evaluation of LV diastolic   function. - Mitral valve: Calcified annulus. There was mild regurgitation. - Right ventricle: The cavity size was moderately dilated. - Tricuspid valve: There was moderate regurgitation. - Pulmonary arteries: PA peak pressure: 32 mm Hg (S).   ASSESSMENT AND PLAN:  1.  Sick sinus syndrome: This post [employer] dual-chamber pacemaker.  Device is functioning appropriately.  We have changed his mode switch to 130 bpm.  2.  Paroxysmal atrial fibrillation: On amiodarone and Eliquis.  He has missed no doses of his Eliquis.  No atrial fibrillation on his device.  Unfortunately his LFTs of gone up and thus we need to stop his amiodarone.  This patients CHA2DS2-VASc Score and unadjusted Ischemic Stroke Rate (% per year) is equal to 4.8 % stroke rate/year from a score  of 4  Above score calculated as 1 point each if present [CHF, HTN, DM, Vascular=MI/PAD/Aortic Plaque, Age if 65-74, or Male] Above score calculated as 2 points each if present [Age > 75, or Stroke/TIA/TE]  3.  Coronary artery disease: No current chest pain.  4.  Hypertension: Blood pressure well controlled.  5.  Hyperlipidemia: Continue statin  6.  Atrial tachycardia: In atrial tachycardia based on device interrogation.  An attempt was made in clinic to pace him out of his tachycardia which did not occur.  He is symptomatic with weakness and fatigue.  We will plan for cardioversion.  Current medicines are reviewed at length with the patient today.   The patient does not have concerns regarding his medicines.  The following changes were made today:  Stop amiodarone  Labs/ tests ordered today include:  Orders Placed This Encounter  Procedures  . EKG 12-Lead     Disposition:   FU with Will Camnitz 3 months  Signed, Will Meredith Leeds, MD  05/26/2018 1:46 PM     [address] [place] [address] [phone] (office) [phone] (fax)

## 2018-05-26 NOTE — Patient Instructions (Addendum)
Medication Instructions:  Your physician has recommended you make the following change in your medication:  1. STOP Amiodarone  Labwork: Please have BMET & CBC drawn at your cancer doctors on 6/5. A handwritten prescription for blood work given to you today to take with you.  Testing/Procedures: Your physician has recommended that you have a Cardioversion (DCCV). Electrical Cardioversion uses a jolt of electricity to your heart either through paddles or wired patches attached to your chest. This is a controlled, usually prescheduled, procedure. Defibrillation is done under light anesthesia in the hospital, and you usually go home the day of the procedure. This is done to get your heart back into a normal rhythm. You are not awake for the procedure. Please see the instruction sheet given to you today. Your provider has recommended a cardioversion.   CARDIOVERSION INSTRUCTIONS You are scheduled for a cardioversion on 06/06/2018 with Dr. Sallyanne Kuster or associates. Please go to [facility] through the [facility],  at 8:30 a.m.  Do not have any food or drink after midnight on 06/05/18.  You may take your medicines with a sip of water on the day of your procedure.  You will need someone to drive you home following your procedure.   Call the [place] office at [phone] if you have any questions, problems or concerns.   Follow-Up: Your physician recommends that you schedule a follow-up appointment in: 3 months with Dr. Curt Bears.    * If you need a refill on your cardiac medications before your next appointment, please call your pharmacy.   *Please note that any paperwork needing to be filled out by the provider will need to be addressed at the front desk prior to seeing the provider. Please note that any FMLA, disability or other documents regarding health condition is subject to a $25.00 charge that must be received prior to completion of paperwork  in the form of a money order or check.  Thank you for choosing [facility]!!   Trinidad Curet, RN [phone]  Any Other Special Instructions Will Be Listed Below (If Applicable).  Electrical Cardioversion Electrical cardioversion is the delivery of a jolt of electricity to change the rhythm of the heart. Sticky patches or metal paddles are placed on the chest to deliver the electricity from a device. This is done to restore a normal rhythm. A rhythm that is too fast or not regular keeps the heart from pumping well. Electrical cardioversion is done in an emergency if:   There is low or no blood pressure as a result of the heart rhythm.    Normal rhythm must be restored as fast as possible to protect the brain and heart from further damage.    It may save a life. Cardioversion may be done for heart rhythms that are not immediately life threatening, such as atrial fibrillation or flutter, in which:   The heart is beating too fast or is not regular.    Medicine to change the rhythm has not worked.    It is safe to wait in order to allow time for preparation.  Symptoms of the abnormal rhythm are bothersome.  The risk of stroke and other serious problems can be reduced.  LET [facility] CARE PROVIDER KNOW ABOUT:   Any allergies you have.  All medicines you are taking, including vitamins, herbs, eye drops, creams, and over-the-counter medicines.  Previous problems you or members of your family have had with the use of  anesthetics.    Any blood disorders you have.    Previous surgeries you have had.    Medical conditions you have.   RISKS AND COMPLICATIONS  Generally, this is a safe procedure. However, problems can occur and include:   Breathing problems related to the anesthetic used.  A blood clot that breaks free and travels to other parts of your body. This could cause a stroke or other problems. The risk of this is lowered by use of blood-thinning medicine  (anticoagulant) prior to the procedure.  Cardiac arrest (rare).   BEFORE THE PROCEDURE   You may have tests to detect blood clots in your heart and to evaluate heart function.   You may start taking anticoagulants so your blood does not clot as easily.    Medicines may be given to help stabilize your heart rate and rhythm.   PROCEDURE  You will be given medicine through an IV tube to reduce discomfort and make you sleepy (sedative).    An electrical shock will be delivered.   AFTER THE PROCEDURE Your heart rhythm will be watched to make sure it does not change. You will need someone to drive you home.

## 2018-05-28 ENCOUNTER — Ambulatory Visit (INDEPENDENT_AMBULATORY_CARE_PROVIDER_SITE_OTHER): Payer: Medicare Other | Admitting: *Deleted

## 2018-05-28 DIAGNOSIS — N189 Chronic kidney disease, unspecified: Secondary | ICD-10-CM

## 2018-05-28 DIAGNOSIS — I495 Sick sinus syndrome: Secondary | ICD-10-CM | POA: Diagnosis not present

## 2018-05-28 DIAGNOSIS — D509 Iron deficiency anemia, unspecified: Secondary | ICD-10-CM | POA: Diagnosis not present

## 2018-05-28 DIAGNOSIS — I471 Supraventricular tachycardia: Secondary | ICD-10-CM

## 2018-05-28 DIAGNOSIS — K922 Gastrointestinal hemorrhage, unspecified: Secondary | ICD-10-CM

## 2018-05-28 DIAGNOSIS — I509 Heart failure, unspecified: Secondary | ICD-10-CM | POA: Diagnosis not present

## 2018-05-28 DIAGNOSIS — E1122 Type 2 diabetes mellitus with diabetic chronic kidney disease: Secondary | ICD-10-CM | POA: Diagnosis not present

## 2018-05-28 DIAGNOSIS — D500 Iron deficiency anemia secondary to blood loss (chronic): Secondary | ICD-10-CM | POA: Diagnosis not present

## 2018-05-28 NOTE — Progress Notes (Signed)
Remote pacemaker transmission.   

## 2018-05-30 DIAGNOSIS — D649 Anemia, unspecified: Secondary | ICD-10-CM | POA: Diagnosis not present

## 2018-06-02 DIAGNOSIS — D649 Anemia, unspecified: Secondary | ICD-10-CM | POA: Diagnosis not present

## 2018-06-03 ENCOUNTER — Encounter: Payer: Self-pay | Admitting: Gastroenterology

## 2018-06-04 DIAGNOSIS — D500 Iron deficiency anemia secondary to blood loss (chronic): Secondary | ICD-10-CM | POA: Diagnosis not present

## 2018-06-06 ENCOUNTER — Encounter (HOSPITAL_COMMUNITY)
Admission: RE | Disposition: A | Discharge status: Home / Self Care | Payer: Self-pay | Source: Ambulatory Visit | Attending: Cardiovascular Disease

## 2018-06-06 ENCOUNTER — Ambulatory Visit (HOSPITAL_COMMUNITY)
Admission: RE | Admit: 2018-06-06 | Discharge: 2018-06-06 | Disposition: A | Discharge status: Home / Self Care | Payer: Medicare Other | Source: Ambulatory Visit | Attending: Cardiovascular Disease | Admitting: Cardiovascular Disease

## 2018-06-06 ENCOUNTER — Encounter (HOSPITAL_COMMUNITY): Payer: Self-pay | Admitting: *Deleted

## 2018-06-06 DIAGNOSIS — E669 Obesity, unspecified: Secondary | ICD-10-CM | POA: Diagnosis not present

## 2018-06-06 DIAGNOSIS — K449 Diaphragmatic hernia without obstruction or gangrene: Secondary | ICD-10-CM | POA: Diagnosis not present

## 2018-06-06 DIAGNOSIS — I130 Hypertensive heart and chronic kidney disease with heart failure and stage 1 through stage 4 chronic kidney disease, or unspecified chronic kidney disease: Secondary | ICD-10-CM | POA: Insufficient documentation

## 2018-06-06 DIAGNOSIS — Z951 Presence of aortocoronary bypass graft: Secondary | ICD-10-CM | POA: Diagnosis not present

## 2018-06-06 DIAGNOSIS — E782 Mixed hyperlipidemia: Secondary | ICD-10-CM | POA: Diagnosis not present

## 2018-06-06 DIAGNOSIS — J449 Chronic obstructive pulmonary disease, unspecified: Secondary | ICD-10-CM | POA: Diagnosis not present

## 2018-06-06 DIAGNOSIS — I471 Supraventricular tachycardia: Secondary | ICD-10-CM | POA: Insufficient documentation

## 2018-06-06 DIAGNOSIS — I495 Sick sinus syndrome: Secondary | ICD-10-CM | POA: Insufficient documentation

## 2018-06-06 DIAGNOSIS — E1122 Type 2 diabetes mellitus with diabetic chronic kidney disease: Secondary | ICD-10-CM | POA: Insufficient documentation

## 2018-06-06 DIAGNOSIS — Z882 Allergy status to sulfonamides status: Secondary | ICD-10-CM | POA: Diagnosis not present

## 2018-06-06 DIAGNOSIS — N189 Chronic kidney disease, unspecified: Secondary | ICD-10-CM | POA: Diagnosis not present

## 2018-06-06 DIAGNOSIS — Z79899 Other long term (current) drug therapy: Secondary | ICD-10-CM | POA: Insufficient documentation

## 2018-06-06 DIAGNOSIS — I509 Heart failure, unspecified: Secondary | ICD-10-CM | POA: Insufficient documentation

## 2018-06-06 DIAGNOSIS — I2510 Atherosclerotic heart disease of native coronary artery without angina pectoris: Secondary | ICD-10-CM | POA: Insufficient documentation

## 2018-06-06 DIAGNOSIS — Z950 Presence of cardiac pacemaker: Secondary | ICD-10-CM | POA: Diagnosis not present

## 2018-06-06 DIAGNOSIS — D631 Anemia in chronic kidney disease: Secondary | ICD-10-CM | POA: Insufficient documentation

## 2018-06-06 DIAGNOSIS — Z538 Procedure and treatment not carried out for other reasons: Secondary | ICD-10-CM | POA: Insufficient documentation

## 2018-06-06 DIAGNOSIS — Z6829 Body mass index (BMI) 29.0-29.9, adult: Secondary | ICD-10-CM | POA: Insufficient documentation

## 2018-06-06 DIAGNOSIS — Z8711 Personal history of peptic ulcer disease: Secondary | ICD-10-CM | POA: Insufficient documentation

## 2018-06-06 DIAGNOSIS — I480 Paroxysmal atrial fibrillation: Secondary | ICD-10-CM | POA: Diagnosis not present

## 2018-06-06 DIAGNOSIS — Z720 Tobacco use: Secondary | ICD-10-CM | POA: Diagnosis not present

## 2018-06-06 HISTORY — DX: Sleep apnea, unspecified: G47.30

## 2018-06-06 LAB — POCT I-STAT, CHEM 8
BUN: 44 mg/dL — AB (ref 6–20)
CREATININE: 1.6 mg/dL — AB (ref 0.61–1.24)
Calcium, Ion: 1.2 mmol/L (ref 1.15–1.40)
Chloride: 94 mmol/L — ABNORMAL LOW (ref 101–111)
Glucose, Bld: 244 mg/dL — ABNORMAL HIGH (ref 65–99)
HEMATOCRIT: 36 % — AB (ref 39.0–52.0)
HEMOGLOBIN: 12.2 g/dL — AB (ref 13.0–17.0)
POTASSIUM: 4.1 mmol/L (ref 3.5–5.1)
SODIUM: 136 mmol/L (ref 135–145)
TCO2: 28 mmol/L (ref 22–32)

## 2018-06-06 SURGERY — CANCELLED PROCEDURE

## 2018-06-06 MED ORDER — APIXABAN 5 MG PO TABS
5.0000 mg | ORAL_TABLET | Freq: Once | ORAL | Status: AC
  Administered 2018-06-06: 5 mg via ORAL
  Filled 2018-06-06 (×2): qty 1

## 2018-06-06 NOTE — Progress Notes (Signed)
Patient admitted to Endo for CV. Patient placed on monitor and was paced. [facility] Rep called and verified NSR. MD confirmed and instructed patient can go home and continue all medications. Patient cancelled prior to entering the procedure room.

## 2018-06-06 NOTE — Progress Notes (Signed)
Presented in NSR, confirmed by pacemaker check. Procedure canceled. Sanda Klein, MD, [facility] [phone] office [phone] pager

## 2018-06-06 NOTE — Interval H&P Note (Signed)
History and Physical Interval Note:  06/06/2018 9:01 AM  [name]  has presented today for surgery, with the diagnosis of afib  The various methods of treatment have been discussed with the patient and family. After consideration of risks, benefits and other options for treatment, the patient has consented to  Procedure(s): CARDIOVERSION (N/A) as a surgical intervention .  The patient's history has been reviewed, patient examined, no change in status, stable for surgery.  I have reviewed the patient's chart and labs.  Questions were answered to the patient's satisfaction.     Tiffanny Lamarche

## 2018-06-12 DIAGNOSIS — I10 Essential (primary) hypertension: Secondary | ICD-10-CM | POA: Diagnosis not present

## 2018-06-12 DIAGNOSIS — Z6834 Body mass index (BMI) 34.0-34.9, adult: Secondary | ICD-10-CM | POA: Diagnosis not present

## 2018-06-12 DIAGNOSIS — E1165 Type 2 diabetes mellitus with hyperglycemia: Secondary | ICD-10-CM | POA: Diagnosis not present

## 2018-06-16 ENCOUNTER — Encounter

## 2018-06-16 ENCOUNTER — Encounter: Payer: Medicare Other | Admitting: Surgery

## 2018-06-16 ENCOUNTER — Encounter (HOSPITAL_COMMUNITY): Payer: Medicare Other

## 2018-06-30 ENCOUNTER — Encounter: Payer: Self-pay | Admitting: Gastroenterology

## 2018-06-30 DIAGNOSIS — N189 Chronic kidney disease, unspecified: Secondary | ICD-10-CM

## 2018-06-30 DIAGNOSIS — K922 Gastrointestinal hemorrhage, unspecified: Secondary | ICD-10-CM

## 2018-06-30 DIAGNOSIS — D500 Iron deficiency anemia secondary to blood loss (chronic): Secondary | ICD-10-CM

## 2018-06-30 DIAGNOSIS — E1122 Type 2 diabetes mellitus with diabetic chronic kidney disease: Secondary | ICD-10-CM

## 2018-07-07 DIAGNOSIS — D500 Iron deficiency anemia secondary to blood loss (chronic): Secondary | ICD-10-CM | POA: Diagnosis not present

## 2018-07-08 ENCOUNTER — Telehealth: Payer: Self-pay

## 2018-07-08 ENCOUNTER — Ambulatory Visit (INDEPENDENT_AMBULATORY_CARE_PROVIDER_SITE_OTHER): Payer: Medicare Other | Admitting: Gastroenterology

## 2018-07-08 ENCOUNTER — Encounter: Payer: Self-pay | Admitting: Gastroenterology

## 2018-07-08 VITALS — BP 142/62 | HR 66 | Ht 72.0 in | Wt 223.0 lb

## 2018-07-08 DIAGNOSIS — M1712 Unilateral primary osteoarthritis, left knee: Secondary | ICD-10-CM | POA: Diagnosis not present

## 2018-07-08 DIAGNOSIS — K922 Gastrointestinal hemorrhage, unspecified: Secondary | ICD-10-CM | POA: Diagnosis not present

## 2018-07-08 DIAGNOSIS — D649 Anemia, unspecified: Secondary | ICD-10-CM | POA: Diagnosis not present

## 2018-07-08 NOTE — Telephone Encounter (Signed)
Patient with diagnosis of Afib on Eliquis for anticoagulation.    Procedure: colonoscopy Date of procedure: 08/26/18  CHADS2-VASc score of  5 (CHF, HTN, AGE, DM2, stroke/tia x 2, CAD, AGE, male)  CrCl 37m/min  Per office protocol, patient can hold Eliquis for 24 hours prior to procedure.

## 2018-07-08 NOTE — Progress Notes (Signed)
Chief Complaint:   Referring Provider:  [name], MD      ASSESSMENT AND PLAN;   #1. 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  Patient is transfusion dependent, on IV Feraheme.  Had negative EGD except for small hiatal hernia 09/2012 and colonoscopy except for colonic polyps and moderate sigmoid diverticulosis 04/2015.  Capsule endoscopy 12/23/2012 showed few small bowel AVMs, limited examination. Also with mild chronic renal insufficiency (Cr 1.9) negative CT scan.  11/2012 except for indeterminate right renal lesion and ventral abdominal wall hernia.  Plan: - EGD and colonoscopy with MiraLAX preparation. Proceed with EGD and colonoscopy.  I have discussed the risks and benefits.  The risks including risk of perforation requiring laparotomy, bleeding after biopsies/polypectomy requiring blood transfusions and risks of anesthesia/sedation were discussed.  Rare risks of missing UGI and colorectal neoplasms were also discussed.  Alternatives were also given.  Patient is fully aware and agrees to proceed. All the questions were answered. Procedures will be scheduled in upcoming days.  Patient is to report immediately if there is any significant weight loss or excessive bleeding until then. Consent forms were given for review. - If negative, do recommend repeating capsule endoscopy. - Hold Eliquis 2 days before endoscopic procedures, if OK with Dr Raliegh Ip (cardiology).  - Continue to monitor hemoglobin/hematocrit and transfuse as needed.  Patient has regular follow-up appointments with Dr. Hinton Rao.  #2.  GERD with small hiatal hernia. - Would continue Protonix for now - Discussed above with the patient's wife in detail. Per patient's wife, he cannot be taken off Eliquis permanently because of his cardiac problems. #3.  Family history of colon cancer (mother>75yr and family history of esophageal cancer  (father) #4.  Iron deficiency anemia due to chronic blood loss.  With element of anemia of chronic disease due to CRI.   HPI:    Lawrence OWCZARZAKis a 72y.o. male  With recurrent anemia and heme positive stools. He always has dark stools due to p.o. Iron. Is also taking IV Feraheme Had 4 units of blood this year. Being followed by Dr. MHinton Raoand Dr. KRaliegh Ip Dr. MHinton Raohas advised re-GI evaluation since patient has been requiring increasing blood transfusions and iron transfusions. Most recent hemoglobin on 07/07/2018 was 10.0, MCV 95, platelets 261K His creatinine was 1.9 and he had normal liver function tests except for alk phos of 294.  He also had a recent hairline fracture and is on left knee brace. No nausea, vomiting, heartburn, regurgitation, odynophagia or dysphagia.  No significant diarrhea or constipation.  There is no hematochezia. No unintentional weight loss. No abdominal pain.    Past Medical History:  Diagnosis Date  . Anemia   . Chronic airway obstruction, not elsewhere classified   . Chronic ischemic heart disease, unspecified   . Coronary artery disease   . Diabetes mellitus    type II, neuropathy,   . Essential hypertension, benign   . Hyperlipidemia, mixed   . Nodule of kidney    incidentally found on CT abdomen 11/2012.  Pending Urology evaluation.   . Nontoxic multinodular goiter   . Obesity   . Other testicular hypofunction   . PUD (peptic ulcer disease)   . Renal insufficiency, mild    hx of  . Sleep apnea    uses cpap    Past Surgical History:  Procedure Laterality Date  . CHOLECYSTECTOMY    .  COLONOSCOPY  05/11/2015   Colonic polyps statuts post polypectomy. Moderate predominalty sigmoid diverticulosis. Internal hemorrhoids. Tubular adenoma.   . CORONARY ARTERY BYPASS GRAFT  2007   x 4  . ESOPHAGOGASTRODUODENOSCOPY     10/06/2012:  hiatal hernia, moderate gastritis.  2012: Colonoscopy at [facility]:  one polyp.  Due next 2017  . EYE SURGERY    . KNEE  DEBRIDEMENT    . PACEMAKER IMPLANT N/A 08/22/2017   Procedure: Pacemaker Implant;  Surgeon: Constance Haw, MD;  Location: [place] [facility];  Service: Cardiovascular;  Laterality: N/A;  . SHOULDER SURGERY     left shoulder  . TONSILECTOMY, ADENOIDECTOMY, BILATERAL MYRINGOTOMY AND TUBES      Family History  Problem Relation Age of Onset  . Colon cancer Mother   . Hypertension Mother   . Kidney cancer Mother   . Cancer Mother        colon  . Esophageal cancer Father   . Stroke Father   . Cancer Father        esophageal  . Asthma Sister   . Emphysema Unknown     Social History   Tobacco Use  . Smoking status: Former Smoker    Packs/day: 1.50    Years: 30.00    Pack years: 45.00    Types: Cigarettes    Last attempt to quit: 2006    Years since quitting: 13.5  . Smokeless tobacco: Former Systems developer    Types: Chew  Substance Use Topics  . Alcohol use: Yes    Comment: rare  . Drug use: No    Current Outpatient Medications  Medication Sig Dispense Refill  . acetaminophen (TYLENOL) 500 MG tablet Take 2,000 mg by mouth every 6 (six) hours as needed for moderate pain or headache.    Marland Kitchen apixaban (ELIQUIS) 5 MG TABS tablet Take 5 mg by mouth 2 (two) times daily.    . Cholecalciferol (VITAMIN D3) 2000 units TABS Take 2,000 Units by mouth daily.    . furosemide (LASIX) 40 MG tablet Take 40 mg by mouth 2 (two) times daily.     . insulin glargine (LANTUS) 100 unit/mL SOPN Inject 2-10 Units into the skin daily as needed (for BS > 110 per sliding scale).    . Ipratropium-Albuterol (COMBIVENT RESPIMAT) 20-100 MCG/ACT AERS respimat Inhale 1 puff into the lungs daily as needed for wheezing.    Marland Kitchen ipratropium-albuterol (DUONEB) 0.5-2.5 (3) MG/3ML SOLN Take 3 mLs by nebulization every 6 (six) hours as needed (for shortness of breath or wheezing).     . metolazone (ZAROXOLYN) 2.5 MG tablet Take 1 tablet (2.5 mg total) by mouth daily. Takes once a day 3 times per week/ m-w-f (Patient taking  differently: Take 2.5 mg by mouth every Monday, Wednesday, and Friday. ) 30 tablet 2  . Multiple Vitamins-Minerals (DRY EYE FORMULA PO) Place 2 drops into both eyes daily as needed (for dry eyes).     . nitroGLYCERIN (NITROSTAT) 0.4 MG SL tablet Place 0.4 mg under the tongue every 5 (five) minutes as needed for chest pain.     . pantoprazole (PROTONIX) 40 MG tablet Take 40 mg by mouth daily before breakfast.     . simvastatin (ZOCOR) 20 MG tablet Take 20 mg by mouth at bedtime.     . vitamin B-12 (CYANOCOBALAMIN) 1000 MCG tablet Take 1,000 mcg by mouth daily.     No current facility-administered medications for this visit.     Allergies  Allergen Reactions  .  Bactrim [Sulfamethoxazole-Trimethoprim] Other (See Comments)    Heart to sleep, renal failure, potassium level spiked    Review of Systems:  Constitutional: Denies fever, chills, diaphoresis, appetite change, has  fatigue.  HEENT: Denies photophobia, eye pain, redness, hearing loss, ear pain, congestion, sore throat, rhinorrhea, sneezing, mouth sores, neck pain, neck stiffness and tinnitus.   Respiratory: Denies SOB, DOE, cough, chest tightness,  and wheezing. Has COPD Cardiovascular: Denies chest pain, palpitations and leg swelling.  Genitourinary: Denies dysuria, urgency, frequency, hematuria, flank pain and difficulty urinating.  Musculoskeletal: Denies myalgias, back pain, joint swelling, arthralgias and has gait problem.  Skin: No rash.  Neurological: Denies dizziness, seizures, syncope, weakness, light-headedness, numbness and headaches.  Hematological: Denies adenopathy. Has Easy bruising. Psychiatric/Behavioral: No anxiety or depression     Physical Exam:    BP (!) 142/62   Pulse 66   Ht 6' (1.829 m)   Wt 223 lb (101.2 kg)   BMI 30.24 kg/m  Filed Weights   07/08/18 1406  Weight: 223 lb (101.2 kg)   Constitutional:  Well-developed, in no acute distress. Psychiatric: Normal mood and affect. Behavior is  normal. HEENT: Pupils normal.  Conjunctivae are normal. No scleral icterus. Neck supple.  Cardiovascular: Normal rate, regular rhythm. No edema.  Pacemaker. Pulmonary/chest: Decreased bilateral breath sounds. Abdominal: Soft, nondistended. Nontender. Bowel sounds active throughout. There are no masses palpable. No hepatomegaly. Has ventral hernia Rectal:  defered Neurological: Alert and oriented to person place and time. Skin: Skin is warm and dry. No rashes noted. Has left knee brace.  Data Reviewed: I have personally reviewed following labs and imaging studies  CBC: CBC Latest Ref Rng & Units 06/06/2018 02/24/2018 01/13/2018  WBC 4.0 - 10.5 K/uL - 12.7(H) 12.8(H)  Hemoglobin 13.0 - 17.0 g/dL 12.2(L) 11.0(L) 8.9(L)  Hematocrit 39.0 - 52.0 % 36.0(L) 33.4(L) 28.2(L)  Platelets 150 - 400 K/uL - 331 198    CMP: CMP Latest Ref Rng & Units 06/06/2018 05/16/2018 03/11/2018  Glucose 65 - 99 mg/dL 244(H) 361(H) 167(H)  BUN 6 - 20 mg/dL 44(H) 59(H) 71(H)  Creatinine 0.61 - 1.24 mg/dL 1.60(H) 1.99(H) 2.69(H)  Sodium 135 - 145 mmol/L 136 137 136  Potassium 3.5 - 5.1 mmol/L 4.1 5.5(H) 4.5  Chloride 101 - 111 mmol/L 94(L) 95(L) 90(L)  CO2 20 - 29 mmol/L - 28 28  Calcium 8.6 - 10.2 mg/dL - 9.5 9.5  Total Protein 6.5 - 8.1 g/dL - - -  Total Bilirubin 0.3 - 1.2 mg/dL - - -  Alkaline Phos 38 - 126 U/L - - -  AST 15 - 41 U/L - - -  ALT 17 - 63 U/L - - -      Carmell Austria, MD 07/08/2018, 2:31 PM  Cc: [name], MD

## 2018-07-08 NOTE — Telephone Encounter (Signed)
[facility] Pre-operative Risk Assessment     Request for surgical clearance:     Endoscopy Procedure  What type of surgery is being performed?     Colonoscopy/EGD  When is this surgery scheduled?     08/26/18  What type of clearance is required ?   Pharmacy  Are there any medications that need to be held prior to surgery and how long? Eliquis  Practice name and name of physician performing surgery?      [facility] Leia Alf   What is your office phone and fax number?      Phone- [phone]  [phone]  Anesthesia type (None, local, MAC, general) ?       MAC

## 2018-07-08 NOTE — Patient Instructions (Addendum)
If you are age 73 or older, your body mass index should be between 23-30. Your Body mass index is 30.24 kg/m. If this is out of the aforementioned range listed, please consider follow up with your Primary Care Provider.  If you are age 25 or younger, your body mass index should be between 19-25. Your Body mass index is 30.24 kg/m. If this is out of the aformentioned range listed, please consider follow up with your Primary Care Provider.   You have been scheduled for an endoscopy and colonoscopy. Please follow the written instructions given to you at your visit today. Please pick up your prep supplies at the pharmacy within the next 1-3 days. If you use inhalers (even only as needed), please bring them with you on the day of your procedure. Your physician has requested that you go to www.startemmi.com and enter the access code given to you at your visit today. This web site gives a general overview about your procedure. However, you should still follow specific instructions given to you by our office regarding your preparation for the procedure.  You will be contacted by our office prior to your procedure for directions on holding your Eliquis.  If you do not hear from our office 1 week prior to your scheduled procedure, please call [phone] to discuss.    Thank you,  Dr. Jackquline Denmark

## 2018-07-09 NOTE — Telephone Encounter (Signed)
Patient informed by phone.

## 2018-07-09 NOTE — Telephone Encounter (Signed)
Per pharmacy- OK to hold Eliquis 24 hours pre op.  Kerin Ransom PA-C 07/09/2018 1:57 PM

## 2018-07-16 ENCOUNTER — Encounter: Payer: Self-pay | Admitting: Cardiology

## 2018-07-16 ENCOUNTER — Ambulatory Visit (INDEPENDENT_AMBULATORY_CARE_PROVIDER_SITE_OTHER): Payer: Medicare Other | Admitting: Cardiology

## 2018-07-16 VITALS — BP 122/64 | HR 71 | Ht 72.0 in | Wt 230.0 lb

## 2018-07-16 DIAGNOSIS — Z950 Presence of cardiac pacemaker: Secondary | ICD-10-CM

## 2018-07-16 DIAGNOSIS — I480 Paroxysmal atrial fibrillation: Secondary | ICD-10-CM | POA: Diagnosis not present

## 2018-07-16 DIAGNOSIS — I2510 Atherosclerotic heart disease of native coronary artery without angina pectoris: Secondary | ICD-10-CM | POA: Diagnosis not present

## 2018-07-16 DIAGNOSIS — I5032 Chronic diastolic (congestive) heart failure: Secondary | ICD-10-CM

## 2018-07-16 NOTE — Patient Instructions (Signed)
Medication Instructions:  Your physician recommends that you continue on your current medications as directed. Please refer to the Current Medication list given to you today.  Labwork: None ordered  Testing/Procedures: None ordered  Follow-Up: Your physician recommends that you schedule a follow-up appointment in: 3 months with Dr. [name]. You will receive a letter or phone call to schedule.   Any Other Special Instructions Will Be Listed Below (If Applicable).     If you need a refill on your cardiac medications before your next appointment, please call your pharmacy.

## 2018-07-16 NOTE — Progress Notes (Signed)
Cardiology Office Note:    Date:  07/16/2018   ID:  Lawrence [name], DOB [birth year], MRN [id number]  PCP:  [name], MD  Cardiologist:  [name], MD    Referring MD: [name], MD   Chief Complaint  Patient presents with  . 2 month follow up  Doing well  History of Present Illness:    Lawrence [name] is a 72 y.o. male with diastolic congestive heart failure, coronary artery disease, hypertension, pacemaker, paroxysmal atrial fibrillation, chronic anemia comes today to office follow-up overall he seems to be doing well denies having any chest pain tightness squeezing pressure burning chest recently he got his device interrogated he was find to be in atrial tachycardia he was brought to [facility] for cardioversion however he converted spontaneously before procedure was performed.  Denies having any palpitations still have some problem with anemia is being followed by primary care physician as well as oncology/hematology team.  Past Medical History:  Diagnosis Date  . Anemia   . Chronic airway obstruction, not elsewhere classified   . Chronic ischemic heart disease, unspecified   . Coronary artery disease   . Diabetes mellitus    type II, neuropathy,   . Essential hypertension, benign   . Hyperlipidemia, mixed   . Nodule of kidney    incidentally found on CT abdomen 11/2012.  Pending Urology evaluation.   . Nontoxic multinodular goiter   . Obesity   . Other testicular hypofunction   . PUD (peptic ulcer disease)   . Renal insufficiency, mild    hx of  . Sleep apnea    uses cpap    Past Surgical History:  Procedure Laterality Date  . CHOLECYSTECTOMY    . COLONOSCOPY  05/11/2015   Colonic polyps statuts post polypectomy. Moderate predominalty sigmoid diverticulosis. Internal hemorrhoids. Tubular adenoma.   . CORONARY ARTERY BYPASS GRAFT  2007   x 4  . ESOPHAGOGASTRODUODENOSCOPY     10/06/2012:  hiatal hernia, moderate gastritis.  2012: Colonoscopy  at [facility]:  one polyp.  Due next 2017  . EYE SURGERY    . KNEE DEBRIDEMENT    . PACEMAKER IMPLANT N/A 08/22/2017   Procedure: Pacemaker Implant;  Surgeon: Constance Haw, MD;  Location: [place] [facility];  Service: Cardiovascular;  Laterality: N/A;  . SHOULDER SURGERY     left shoulder  . TONSILECTOMY, ADENOIDECTOMY, BILATERAL MYRINGOTOMY AND TUBES      Current Medications: Current Meds  Medication Sig  . acetaminophen (TYLENOL) 500 MG tablet Take 2,000 mg by mouth every 6 (six) hours as needed for moderate pain or headache.  Marland Kitchen apixaban (ELIQUIS) 5 MG TABS tablet Take 5 mg by mouth 2 (two) times daily.  . Cholecalciferol (VITAMIN D3) 2000 units TABS Take 2,000 Units by mouth daily.  . furosemide (LASIX) 40 MG tablet Take 40 mg by mouth 2 (two) times daily.   . insulin glargine (LANTUS) 100 unit/mL SOPN Inject 2-10 Units into the skin daily as needed (for BS > 110 per sliding scale).  . Ipratropium-Albuterol (COMBIVENT RESPIMAT) 20-100 MCG/ACT AERS respimat Inhale 1 puff into the lungs daily as needed for wheezing.  Marland Kitchen ipratropium-albuterol (DUONEB) 0.5-2.5 (3) MG/3ML SOLN Take 3 mLs by nebulization every 6 (six) hours as needed (for shortness of breath or wheezing).   . metolazone (ZAROXOLYN) 2.5 MG tablet Take 1 tablet (2.5 mg total) by mouth daily. Takes once a day 3 times per week/ m-w-f (Patient taking differently: Take 2.5  mg by mouth every Monday, Wednesday, and Friday. )  . Multiple Vitamins-Minerals (DRY EYE FORMULA PO) Place 2 drops into both eyes daily as needed (for dry eyes).   . nitroGLYCERIN (NITROSTAT) 0.4 MG SL tablet Place 0.4 mg under the tongue every 5 (five) minutes as needed for chest pain.   . pantoprazole (PROTONIX) 40 MG tablet Take 40 mg by mouth daily before breakfast.   . simvastatin (ZOCOR) 20 MG tablet Take 20 mg by mouth at bedtime.   . vitamin B-12 (CYANOCOBALAMIN) 1000 MCG tablet Take 1,000 mcg by mouth daily.     Allergies:   Bactrim  [sulfamethoxazole-trimethoprim]   Social History   Socioeconomic History  . Marital status: Married    Spouse name: Not on file  . Number of children: 5  . Years of education: Not on file  . Highest education level: Not on file  Occupational History  . Occupation: Retired    Comment: Medical illustrator; retired Chiropodist  Social Needs  . Financial resource strain: Not on file  . Food insecurity:    Worry: Not on file    Inability: Not on file  . Transportation needs:    Medical: Not on file    Non-medical: Not on file  Tobacco Use  . Smoking status: Former Smoker    Packs/day: 1.50    Years: 30.00    Pack years: 45.00    Types: Cigarettes    Last attempt to quit: 2006    Years since quitting: 13.5  . Smokeless tobacco: Former Systems developer    Types: Chew  Substance and Sexual Activity  . Alcohol use: Yes    Comment: rare  . Drug use: No  . Sexual activity: Not on file  Lifestyle  . Physical activity:    Days per week: Not on file    Minutes per session: Not on file  . Stress: Not on file  Relationships  . Social connections:    Talks on phone: Not on file    Gets together: Not on file    Attends religious service: Not on file    Active member of club or organization: Not on file    Attends meetings of clubs or organizations: Not on file    Relationship status: Not on file  Other Topics Concern  . Not on file  Social History Narrative   ** Merged History Encounter **       Lives with wife in a one story home.  Has 6 children.  Retired Nature conservation officer.  Education: college.     Family History: The patient's family history includes Asthma in his sister; Cancer in his father and mother; Colon cancer in his mother; Emphysema in his unknown relative; Esophageal cancer in his father; Hypertension in his mother; Kidney cancer in his mother; Stroke in his father. ROS:   Please see the history of present illness.    All 14 point review of systems negative except as described  per history of present illness  EKGs/Labs/Other Studies Reviewed:      Recent Labs: 01/03/2018: TSH 6.384 01/09/2018: Magnesium 2.4 02/24/2018: ALT 67; B Natriuretic Peptide 370.5; Platelets 331 05/16/2018: NT-Pro BNP 3,272 06/06/2018: BUN 44; Creatinine, Ser 1.60; Hemoglobin 12.2; Potassium 4.1; Sodium 136  Recent Lipid Panel No results found for: CHOL, TRIG, HDL, CHOLHDL, VLDL, LDLCALC, LDLDIRECT  Physical Exam:    VS:  BP 122/64   Pulse 71   Ht 6' (1.829 m)   Wt 230 lb (104.3 kg)  SpO2 96%   BMI 31.19 kg/m     Wt Readings from Last 3 Encounters:  07/16/18 230 lb (104.3 kg)  07/08/18 223 lb (101.2 kg)  06/06/18 220 lb (99.8 kg)     GEN:  Well nourished, well developed in no acute distress HEENT: Normal NECK: No JVD; No carotid bruits LYMPHATICS: No lymphadenopathy CARDIAC: RRR, no murmurs, no rubs, no gallops RESPIRATORY:  Clear to auscultation without rales, wheezing or rhonchi  ABDOMEN: Soft, non-tender, non-distended MUSCULOSKELETAL:  No edema; No deformity  SKIN: Warm and dry LOWER EXTREMITIES: no swelling NEUROLOGIC:  Alert and oriented x 3 PSYCHIATRIC:  Normal affect   ASSESSMENT:    1. CHF (congestive heart failure), NYHA class II, chronic, diastolic ([place])   2. Coronary artery disease involving native coronary artery of native heart without angina pectoris   3. Paroxysmal atrial fibrillation (HCC)   4. Pacemaker    PLAN:    In order of problems listed above:  1. Congestive heart failure: Compensated on appropriate medications which I will continue.  Hemodynamically stable weight stable 2. Coronary artery disease no trouble lately.  We will continue present management. 3. Paroxysmal atrial fibrillation he is anticoagulated with Eliquis which I will continue.  The problem is that he does have chronic anemia and there is some conversation about him potentially being transfusion dependent if this is the case and in the future we may need to be forced to stop  his anticoagulation with can consider watchman device I started talking to him about that. 4. Pacemaker present: Recent interrogation showed normal function.  He was in atrial tachycardia at the time he was sent to [facility] for cardioversion however he spontaneously converted before procedure was performed.  Overall Thomann is doing well from my standpoint review we will see him back in the office in about 3 months or sooner if he has a problem   Medication Adjustments/Labs and Tests Ordered: Current medicines are reviewed at length with the patient today.  Concerns regarding medicines are outlined above.  No orders of the defined types were placed in this encounter.  Medication changes: No orders of the defined types were placed in this encounter.   Signed, Park Liter, MD, [facility] 07/16/2018 10:13 AM    [place]

## 2018-07-17 LAB — CUP PACEART REMOTE DEVICE CHECK
Battery Voltage: 3.01 V
Brady Statistic AP VP Percent: 11 %
Brady Statistic AS VS Percent: 9.3 %
Brady Statistic RA Percent Paced: 1 %
Brady Statistic RV Percent Paced: 59 %
Implantable Lead Implant Date: 20180830
Implantable Lead Location: 753859
Implantable Lead Location: 753860
Implantable Pulse Generator Implant Date: 20180830
Lead Channel Impedance Value: 490 Ohm
Lead Channel Pacing Threshold Amplitude: 0.75 V
Lead Channel Pacing Threshold Pulse Width: 0.5 ms
Lead Channel Sensing Intrinsic Amplitude: 12 mV
Lead Channel Setting Pacing Amplitude: 1.75 V
Lead Channel Setting Pacing Pulse Width: 0.5 ms
Lead Channel Setting Sensing Sensitivity: 2 mV
MDC IDC LEAD IMPLANT DT: 20180830
MDC IDC MSMT BATTERY REMAINING LONGEVITY: 109 mo
MDC IDC MSMT BATTERY REMAINING PERCENTAGE: 95.5 %
MDC IDC MSMT LEADCHNL RA IMPEDANCE VALUE: 460 Ohm
MDC IDC MSMT LEADCHNL RA SENSING INTR AMPL: 2.1 mV
MDC IDC MSMT LEADCHNL RV PACING THRESHOLD AMPLITUDE: 0.75 V
MDC IDC MSMT LEADCHNL RV PACING THRESHOLD PULSEWIDTH: 0.5 ms
MDC IDC SESS DTM: 20190605060014
MDC IDC SET LEADCHNL RV PACING AMPLITUDE: 2.5 V
MDC IDC STAT BRADY AP VS PERCENT: 1.4 %
MDC IDC STAT BRADY AS VP PERCENT: 76 %
Pulse Gen Model: 2272
Pulse Gen Serial Number: 8939815

## 2018-07-20 DIAGNOSIS — R231 Pallor: Secondary | ICD-10-CM | POA: Diagnosis not present

## 2018-07-20 DIAGNOSIS — R918 Other nonspecific abnormal finding of lung field: Secondary | ICD-10-CM | POA: Diagnosis not present

## 2018-07-20 DIAGNOSIS — I110 Hypertensive heart disease with heart failure: Secondary | ICD-10-CM | POA: Diagnosis not present

## 2018-07-20 DIAGNOSIS — R112 Nausea with vomiting, unspecified: Secondary | ICD-10-CM | POA: Diagnosis not present

## 2018-07-20 DIAGNOSIS — Z951 Presence of aortocoronary bypass graft: Secondary | ICD-10-CM | POA: Diagnosis not present

## 2018-07-20 DIAGNOSIS — R1084 Generalized abdominal pain: Secondary | ICD-10-CM | POA: Diagnosis not present

## 2018-07-20 DIAGNOSIS — K922 Gastrointestinal hemorrhage, unspecified: Secondary | ICD-10-CM | POA: Diagnosis not present

## 2018-07-20 DIAGNOSIS — K5900 Constipation, unspecified: Secondary | ICD-10-CM | POA: Diagnosis not present

## 2018-07-20 DIAGNOSIS — Z87891 Personal history of nicotine dependence: Secondary | ICD-10-CM | POA: Diagnosis not present

## 2018-07-20 DIAGNOSIS — I2510 Atherosclerotic heart disease of native coronary artery without angina pectoris: Secondary | ICD-10-CM | POA: Diagnosis not present

## 2018-07-20 DIAGNOSIS — E1010 Type 1 diabetes mellitus with ketoacidosis without coma: Secondary | ICD-10-CM | POA: Diagnosis not present

## 2018-07-20 DIAGNOSIS — I4891 Unspecified atrial fibrillation: Secondary | ICD-10-CM | POA: Diagnosis not present

## 2018-07-20 DIAGNOSIS — R110 Nausea: Secondary | ICD-10-CM | POA: Diagnosis not present

## 2018-07-20 DIAGNOSIS — I509 Heart failure, unspecified: Secondary | ICD-10-CM | POA: Diagnosis not present

## 2018-07-20 DIAGNOSIS — E1165 Type 2 diabetes mellitus with hyperglycemia: Secondary | ICD-10-CM | POA: Diagnosis not present

## 2018-07-21 ENCOUNTER — Inpatient Hospital Stay (HOSPITAL_COMMUNITY): Payer: Medicare Other | Admitting: Anesthesiology

## 2018-07-21 ENCOUNTER — Inpatient Hospital Stay (HOSPITAL_COMMUNITY)
Admission: AD | Admit: 2018-07-21 | Discharge: 2018-07-31 | DRG: 330 | Disposition: A | Discharge status: Home Health | Payer: Medicare Other | Source: Other Acute Inpatient Hospital | Attending: Internal Medicine | Admitting: Internal Medicine

## 2018-07-21 ENCOUNTER — Encounter (HOSPITAL_COMMUNITY)
Admission: AD | Disposition: A | Discharge status: Home Health | Payer: Self-pay | Source: Other Acute Inpatient Hospital | Attending: Internal Medicine

## 2018-07-21 ENCOUNTER — Inpatient Hospital Stay (HOSPITAL_COMMUNITY): Payer: Medicare Other

## 2018-07-21 ENCOUNTER — Inpatient Hospital Stay
Admission: AD | Admit: 2018-07-21 | Payer: Self-pay | Source: Other Acute Inpatient Hospital | Admitting: Internal Medicine

## 2018-07-21 DIAGNOSIS — E042 Nontoxic multinodular goiter: Secondary | ICD-10-CM | POA: Diagnosis present

## 2018-07-21 DIAGNOSIS — Z978 Presence of other specified devices: Secondary | ICD-10-CM

## 2018-07-21 DIAGNOSIS — K559 Vascular disorder of intestine, unspecified: Secondary | ICD-10-CM | POA: Diagnosis not present

## 2018-07-21 DIAGNOSIS — R1013 Epigastric pain: Secondary | ICD-10-CM

## 2018-07-21 DIAGNOSIS — Z87891 Personal history of nicotine dependence: Secondary | ICD-10-CM

## 2018-07-21 DIAGNOSIS — I5032 Chronic diastolic (congestive) heart failure: Secondary | ICD-10-CM | POA: Diagnosis present

## 2018-07-21 DIAGNOSIS — M47816 Spondylosis without myelopathy or radiculopathy, lumbar region: Secondary | ICD-10-CM | POA: Diagnosis present

## 2018-07-21 DIAGNOSIS — K5730 Diverticulosis of large intestine without perforation or abscess without bleeding: Secondary | ICD-10-CM | POA: Diagnosis present

## 2018-07-21 DIAGNOSIS — R52 Pain, unspecified: Secondary | ICD-10-CM

## 2018-07-21 DIAGNOSIS — I130 Hypertensive heart and chronic kidney disease with heart failure and stage 1 through stage 4 chronic kidney disease, or unspecified chronic kidney disease: Secondary | ICD-10-CM | POA: Diagnosis not present

## 2018-07-21 DIAGNOSIS — R1110 Vomiting, unspecified: Secondary | ICD-10-CM | POA: Diagnosis not present

## 2018-07-21 DIAGNOSIS — Z6831 Body mass index (BMI) 31.0-31.9, adult: Secondary | ICD-10-CM

## 2018-07-21 DIAGNOSIS — N179 Acute kidney failure, unspecified: Secondary | ICD-10-CM | POA: Diagnosis present

## 2018-07-21 DIAGNOSIS — Z452 Encounter for adjustment and management of vascular access device: Secondary | ICD-10-CM | POA: Diagnosis not present

## 2018-07-21 DIAGNOSIS — Z800 Family history of malignant neoplasm of digestive organs: Secondary | ICD-10-CM

## 2018-07-21 DIAGNOSIS — Y9389 Activity, other specified: Secondary | ICD-10-CM

## 2018-07-21 DIAGNOSIS — I2510 Atherosclerotic heart disease of native coronary artery without angina pectoris: Secondary | ICD-10-CM | POA: Diagnosis present

## 2018-07-21 DIAGNOSIS — N183 Chronic kidney disease, stage 3 unspecified: Secondary | ICD-10-CM

## 2018-07-21 DIAGNOSIS — Z950 Presence of cardiac pacemaker: Secondary | ICD-10-CM

## 2018-07-21 DIAGNOSIS — E782 Mixed hyperlipidemia: Secondary | ICD-10-CM | POA: Diagnosis present

## 2018-07-21 DIAGNOSIS — K921 Melena: Secondary | ICD-10-CM

## 2018-07-21 DIAGNOSIS — D62 Acute posthemorrhagic anemia: Secondary | ICD-10-CM | POA: Diagnosis not present

## 2018-07-21 DIAGNOSIS — K449 Diaphragmatic hernia without obstruction or gangrene: Secondary | ICD-10-CM | POA: Diagnosis present

## 2018-07-21 DIAGNOSIS — I480 Paroxysmal atrial fibrillation: Secondary | ICD-10-CM | POA: Diagnosis present

## 2018-07-21 DIAGNOSIS — K274 Chronic or unspecified peptic ulcer, site unspecified, with hemorrhage: Secondary | ICD-10-CM | POA: Diagnosis not present

## 2018-07-21 DIAGNOSIS — W1839XA Other fall on same level, initial encounter: Secondary | ICD-10-CM | POA: Diagnosis not present

## 2018-07-21 DIAGNOSIS — R1084 Generalized abdominal pain: Secondary | ICD-10-CM

## 2018-07-21 DIAGNOSIS — D509 Iron deficiency anemia, unspecified: Secondary | ICD-10-CM | POA: Diagnosis present

## 2018-07-21 DIAGNOSIS — K55041 Focal (segmental) acute infarction of large intestine: Secondary | ICD-10-CM | POA: Diagnosis not present

## 2018-07-21 DIAGNOSIS — E872 Acidosis: Secondary | ICD-10-CM | POA: Diagnosis not present

## 2018-07-21 DIAGNOSIS — I482 Chronic atrial fibrillation: Secondary | ICD-10-CM | POA: Diagnosis not present

## 2018-07-21 DIAGNOSIS — K219 Gastro-esophageal reflux disease without esophagitis: Secondary | ICD-10-CM | POA: Diagnosis present

## 2018-07-21 DIAGNOSIS — E870 Hyperosmolality and hypernatremia: Secondary | ICD-10-CM | POA: Diagnosis not present

## 2018-07-21 DIAGNOSIS — R918 Other nonspecific abnormal finding of lung field: Secondary | ICD-10-CM | POA: Diagnosis not present

## 2018-07-21 DIAGNOSIS — M5134 Other intervertebral disc degeneration, thoracic region: Secondary | ICD-10-CM | POA: Diagnosis not present

## 2018-07-21 DIAGNOSIS — K55021 Focal (segmental) acute infarction of small intestine: Secondary | ICD-10-CM | POA: Diagnosis not present

## 2018-07-21 DIAGNOSIS — J449 Chronic obstructive pulmonary disease, unspecified: Secondary | ICD-10-CM | POA: Diagnosis present

## 2018-07-21 DIAGNOSIS — Z794 Long term (current) use of insulin: Secondary | ICD-10-CM

## 2018-07-21 DIAGNOSIS — D649 Anemia, unspecified: Secondary | ICD-10-CM

## 2018-07-21 DIAGNOSIS — I4891 Unspecified atrial fibrillation: Secondary | ICD-10-CM | POA: Diagnosis not present

## 2018-07-21 DIAGNOSIS — T45515A Adverse effect of anticoagulants, initial encounter: Secondary | ICD-10-CM | POA: Diagnosis present

## 2018-07-21 DIAGNOSIS — R0602 Shortness of breath: Secondary | ICD-10-CM | POA: Diagnosis not present

## 2018-07-21 DIAGNOSIS — E1010 Type 1 diabetes mellitus with ketoacidosis without coma: Secondary | ICD-10-CM | POA: Diagnosis not present

## 2018-07-21 DIAGNOSIS — K922 Gastrointestinal hemorrhage, unspecified: Secondary | ICD-10-CM | POA: Diagnosis present

## 2018-07-21 DIAGNOSIS — Y92230 Patient room in hospital as the place of occurrence of the external cause: Secondary | ICD-10-CM | POA: Diagnosis not present

## 2018-07-21 DIAGNOSIS — Z86010 Personal history of colonic polyps: Secondary | ICD-10-CM

## 2018-07-21 DIAGNOSIS — Z8711 Personal history of peptic ulcer disease: Secondary | ICD-10-CM

## 2018-07-21 DIAGNOSIS — I361 Nonrheumatic tricuspid (valve) insufficiency: Secondary | ICD-10-CM | POA: Diagnosis not present

## 2018-07-21 DIAGNOSIS — K55029 Acute infarction of small intestine, extent unspecified: Secondary | ICD-10-CM | POA: Diagnosis not present

## 2018-07-21 DIAGNOSIS — G8929 Other chronic pain: Secondary | ICD-10-CM | POA: Diagnosis present

## 2018-07-21 DIAGNOSIS — I509 Heart failure, unspecified: Secondary | ICD-10-CM | POA: Diagnosis not present

## 2018-07-21 DIAGNOSIS — E1122 Type 2 diabetes mellitus with diabetic chronic kidney disease: Secondary | ICD-10-CM | POA: Diagnosis not present

## 2018-07-21 DIAGNOSIS — M545 Low back pain: Secondary | ICD-10-CM | POA: Diagnosis not present

## 2018-07-21 DIAGNOSIS — R112 Nausea with vomiting, unspecified: Secondary | ICD-10-CM | POA: Diagnosis not present

## 2018-07-21 DIAGNOSIS — K567 Ileus, unspecified: Secondary | ICD-10-CM | POA: Diagnosis not present

## 2018-07-21 DIAGNOSIS — I110 Hypertensive heart disease with heart failure: Secondary | ICD-10-CM | POA: Diagnosis not present

## 2018-07-21 DIAGNOSIS — R109 Unspecified abdominal pain: Secondary | ICD-10-CM | POA: Diagnosis not present

## 2018-07-21 DIAGNOSIS — K5520 Angiodysplasia of colon without hemorrhage: Secondary | ICD-10-CM | POA: Diagnosis not present

## 2018-07-21 DIAGNOSIS — E1165 Type 2 diabetes mellitus with hyperglycemia: Secondary | ICD-10-CM | POA: Diagnosis present

## 2018-07-21 DIAGNOSIS — Z4682 Encounter for fitting and adjustment of non-vascular catheter: Secondary | ICD-10-CM | POA: Diagnosis not present

## 2018-07-21 DIAGNOSIS — Z882 Allergy status to sulfonamides status: Secondary | ICD-10-CM

## 2018-07-21 DIAGNOSIS — K55019 Acute (reversible) ischemia of small intestine, extent unspecified: Secondary | ICD-10-CM | POA: Diagnosis not present

## 2018-07-21 DIAGNOSIS — Z7901 Long term (current) use of anticoagulants: Secondary | ICD-10-CM

## 2018-07-21 DIAGNOSIS — Z951 Presence of aortocoronary bypass graft: Secondary | ICD-10-CM

## 2018-07-21 DIAGNOSIS — D72829 Elevated white blood cell count, unspecified: Secondary | ICD-10-CM

## 2018-07-21 DIAGNOSIS — I4821 Permanent atrial fibrillation: Secondary | ICD-10-CM

## 2018-07-21 DIAGNOSIS — R1111 Vomiting without nausea: Secondary | ICD-10-CM | POA: Diagnosis not present

## 2018-07-21 DIAGNOSIS — J810 Acute pulmonary edema: Secondary | ICD-10-CM | POA: Diagnosis not present

## 2018-07-21 DIAGNOSIS — J9811 Atelectasis: Secondary | ICD-10-CM | POA: Diagnosis not present

## 2018-07-21 DIAGNOSIS — G4733 Obstructive sleep apnea (adult) (pediatric): Secondary | ICD-10-CM | POA: Diagnosis present

## 2018-07-21 DIAGNOSIS — E785 Hyperlipidemia, unspecified: Secondary | ICD-10-CM | POA: Diagnosis not present

## 2018-07-21 DIAGNOSIS — J939 Pneumothorax, unspecified: Secondary | ICD-10-CM

## 2018-07-21 DIAGNOSIS — M546 Pain in thoracic spine: Secondary | ICD-10-CM | POA: Diagnosis not present

## 2018-07-21 DIAGNOSIS — R1010 Upper abdominal pain, unspecified: Secondary | ICD-10-CM | POA: Diagnosis not present

## 2018-07-21 DIAGNOSIS — K55049 Acute infarction of large intestine, extent unspecified: Secondary | ICD-10-CM | POA: Diagnosis not present

## 2018-07-21 DIAGNOSIS — K6389 Other specified diseases of intestine: Secondary | ICD-10-CM | POA: Diagnosis not present

## 2018-07-21 HISTORY — PX: LAPAROTOMY: SHX154

## 2018-07-21 HISTORY — DX: Gastrointestinal hemorrhage, unspecified: K92.2

## 2018-07-21 LAB — LACTIC ACID, PLASMA
LACTIC ACID, VENOUS: 4.4 mmol/L — AB (ref 0.5–1.9)
Lactic Acid, Venous: 4.4 mmol/L (ref 0.5–1.9)

## 2018-07-21 LAB — CBC WITH DIFFERENTIAL/PLATELET
Abs Immature Granulocytes: 0.2 10*3/uL — ABNORMAL HIGH (ref 0.0–0.1)
BASOS ABS: 0.1 10*3/uL (ref 0.0–0.1)
BASOS PCT: 0 %
EOS ABS: 0.1 10*3/uL (ref 0.0–0.7)
Eosinophils Relative: 1 %
HCT: 31.4 % — ABNORMAL LOW (ref 39.0–52.0)
Hemoglobin: 10.4 g/dL — ABNORMAL LOW (ref 13.0–17.0)
Immature Granulocytes: 1 %
Lymphocytes Relative: 2 %
Lymphs Abs: 0.6 10*3/uL — ABNORMAL LOW (ref 0.7–4.0)
MCH: 31.7 pg (ref 26.0–34.0)
MCHC: 33.1 g/dL (ref 30.0–36.0)
MCV: 95.7 fL (ref 78.0–100.0)
MONO ABS: 0.8 10*3/uL (ref 0.1–1.0)
Monocytes Relative: 3 %
Neutro Abs: 22.4 10*3/uL — ABNORMAL HIGH (ref 1.7–7.7)
Neutrophils Relative %: 93 %
PLATELETS: 364 10*3/uL (ref 150–400)
RBC: 3.28 MIL/uL — ABNORMAL LOW (ref 4.22–5.81)
RDW: 14.4 % (ref 11.5–15.5)
WBC: 24.2 10*3/uL — ABNORMAL HIGH (ref 4.0–10.5)

## 2018-07-21 LAB — BASIC METABOLIC PANEL
ANION GAP: 11 (ref 5–15)
BUN: 112 mg/dL — ABNORMAL HIGH (ref 8–23)
CALCIUM: 8.1 mg/dL — AB (ref 8.9–10.3)
CHLORIDE: 102 mmol/L (ref 98–111)
CO2: 22 mmol/L (ref 22–32)
Creatinine, Ser: 2.78 mg/dL — ABNORMAL HIGH (ref 0.61–1.24)
GFR calc Af Amer: 25 mL/min — ABNORMAL LOW (ref >60)
GFR calc non Af Amer: 21 mL/min — ABNORMAL LOW (ref >60)
GLUCOSE: 244 mg/dL — AB (ref 70–99)
Potassium: 4 mmol/L (ref 3.5–5.1)
Sodium: 135 mmol/L (ref 135–145)

## 2018-07-21 LAB — GLUCOSE, CAPILLARY
GLUCOSE-CAPILLARY: 480 mg/dL — AB (ref 70–99)
GLUCOSE-CAPILLARY: 391 mg/dL — AB (ref 70–99)
GLUCOSE-CAPILLARY: 410 mg/dL — AB (ref 70–99)
GLUCOSE-CAPILLARY: 407 mg/dL — AB (ref 70–99)
GLUCOSE-CAPILLARY: 205 mg/dL — AB (ref 70–99)
GLUCOSE-CAPILLARY: 171 mg/dL — AB (ref 70–99)
GLUCOSE-CAPILLARY: 167 mg/dL — AB (ref 70–99)
Glucose-Capillary: 326 mg/dL — ABNORMAL HIGH (ref 70–99)
Glucose-Capillary: 325 mg/dL — ABNORMAL HIGH (ref 70–99)
Glucose-Capillary: 111 mg/dL — ABNORMAL HIGH (ref 70–99)
Glucose-Capillary: 201 mg/dL — ABNORMAL HIGH (ref 70–99)
Glucose-Capillary: 202 mg/dL — ABNORMAL HIGH (ref 70–99)
Glucose-Capillary: 189 mg/dL — ABNORMAL HIGH (ref 70–99)
Glucose-Capillary: 159 mg/dL — ABNORMAL HIGH (ref 70–99)

## 2018-07-21 LAB — COMPREHENSIVE METABOLIC PANEL
ALK PHOS: 239 U/L — AB (ref 38–126)
ALT: 33 U/L (ref 0–44)
ANION GAP: 18 — AB (ref 5–15)
AST: 49 U/L — ABNORMAL HIGH (ref 15–41)
Albumin: 3.3 g/dL — ABNORMAL LOW (ref 3.5–5.0)
BUN: 121 mg/dL — ABNORMAL HIGH (ref 8–23)
CALCIUM: 9.3 mg/dL (ref 8.9–10.3)
CHLORIDE: 91 mmol/L — AB (ref 98–111)
CO2: 24 mmol/L (ref 22–32)
Creatinine, Ser: 2.67 mg/dL — ABNORMAL HIGH (ref 0.61–1.24)
GFR calc non Af Amer: 22 mL/min — ABNORMAL LOW (ref >60)
GFR, EST AFRICAN AMERICAN: 26 mL/min — AB (ref >60)
Glucose, Bld: 484 mg/dL — ABNORMAL HIGH (ref 70–99)
Potassium: 3.3 mmol/L — ABNORMAL LOW (ref 3.5–5.1)
SODIUM: 133 mmol/L — AB (ref 135–145)
Total Bilirubin: 1 mg/dL (ref 0.3–1.2)
Total Protein: 6.7 g/dL (ref 6.5–8.1)

## 2018-07-21 LAB — MAGNESIUM: Magnesium: 2.3 mg/dL (ref 1.7–2.4)

## 2018-07-21 LAB — URINALYSIS, ROUTINE W REFLEX MICROSCOPIC
BACTERIA UA: NONE SEEN
BILIRUBIN URINE: NEGATIVE
Glucose, UA: >=500 mg/dL — AB
Hgb urine dipstick: NEGATIVE
KETONES UR: NEGATIVE mg/dL
LEUKOCYTES UA: NEGATIVE
NITRITE: NEGATIVE
Protein, ur: NEGATIVE mg/dL
Specific Gravity, Urine: 1.011 (ref 1.005–1.030)
pH: 5 (ref 5.0–8.0)

## 2018-07-21 LAB — CBC
HCT: 31.6 % — ABNORMAL LOW (ref 39.0–52.0)
HCT: 26.9 % — ABNORMAL LOW (ref 39.0–52.0)
Hemoglobin: 11 g/dL — ABNORMAL LOW (ref 13.0–17.0)
Hemoglobin: 8.7 g/dL — ABNORMAL LOW (ref 13.0–17.0)
MCH: 31.6 pg (ref 26.0–34.0)
MCH: 31.4 pg (ref 26.0–34.0)
MCHC: 34.8 g/dL (ref 30.0–36.0)
MCHC: 32.3 g/dL (ref 30.0–36.0)
MCV: 90.8 fL (ref 78.0–100.0)
MCV: 97.1 fL (ref 78.0–100.0)
Platelets: 458 10*3/uL — ABNORMAL HIGH (ref 150–400)
Platelets: 377 10*3/uL (ref 150–400)
RBC: 3.48 MIL/uL — AB (ref 4.22–5.81)
RBC: 2.77 MIL/uL — ABNORMAL LOW (ref 4.22–5.81)
RDW: 14.6 % (ref 11.5–15.5)
RDW: 15.4 % (ref 11.5–15.5)
WBC: 50.2 10*3/uL — AB (ref 4.0–10.5)
WBC: 31.7 10*3/uL — AB (ref 4.0–10.5)

## 2018-07-21 LAB — LIPASE, BLOOD: Lipase: 60 U/L — ABNORMAL HIGH (ref 11–51)

## 2018-07-21 LAB — TROPONIN I
Troponin I: 0.03 ng/mL (ref <0.03)
Troponin I: 0.03 ng/mL (ref <0.03)

## 2018-07-21 LAB — MRSA PCR SCREENING: MRSA by PCR: NEGATIVE

## 2018-07-21 LAB — SODIUM, URINE, RANDOM: SODIUM UR: 45 mmol/L

## 2018-07-21 LAB — TYPE AND SCREEN
ABO/RH(D): O POS
Antibody Screen: NEGATIVE

## 2018-07-21 LAB — SURGICAL PCR SCREEN
MRSA, PCR: NEGATIVE
Staphylococcus aureus: NEGATIVE

## 2018-07-21 LAB — BETA-HYDROXYBUTYRIC ACID: Beta-Hydroxybutyric Acid: 0.18 mmol/L (ref 0.05–0.27)

## 2018-07-21 LAB — PHOSPHORUS: Phosphorus: 3.6 mg/dL (ref 2.5–4.6)

## 2018-07-21 LAB — OSMOLALITY, URINE: OSMOLALITY UR: 446 mosm/kg (ref 300–900)

## 2018-07-21 SURGERY — LAPAROTOMY, EXPLORATORY
Anesthesia: General | Site: Abdomen

## 2018-07-21 MED ORDER — PHENYLEPHRINE 40 MCG/ML (10ML) SYRINGE FOR IV PUSH (FOR BLOOD PRESSURE SUPPORT)
PREFILLED_SYRINGE | INTRAVENOUS | Status: DC | PRN
  Administered 2018-07-21: 80 ug via INTRAVENOUS

## 2018-07-21 MED ORDER — FENTANYL CITRATE (PF) 100 MCG/2ML IJ SOLN
INTRAMUSCULAR | Status: AC
  Filled 2018-07-21: qty 2

## 2018-07-21 MED ORDER — ONDANSETRON HCL 4 MG/2ML IJ SOLN
INTRAMUSCULAR | Status: AC
  Filled 2018-07-21: qty 2

## 2018-07-21 MED ORDER — ROCURONIUM BROMIDE 10 MG/ML (PF) SYRINGE
PREFILLED_SYRINGE | INTRAVENOUS | Status: AC
  Filled 2018-07-21: qty 10

## 2018-07-21 MED ORDER — SODIUM CHLORIDE 0.9 % IV SOLN
250.0000 mL | INTRAVENOUS | Status: DC | PRN
  Administered 2018-07-30: 250 mL via INTRAVENOUS

## 2018-07-21 MED ORDER — FENTANYL CITRATE (PF) 250 MCG/5ML IJ SOLN
INTRAMUSCULAR | Status: AC
  Filled 2018-07-21: qty 5

## 2018-07-21 MED ORDER — ONDANSETRON HCL 4 MG/2ML IJ SOLN
INTRAMUSCULAR | Status: DC | PRN
  Administered 2018-07-21: 4 mg via INTRAVENOUS

## 2018-07-21 MED ORDER — POTASSIUM CHLORIDE 10 MEQ/100ML IV SOLN
10.0000 meq | INTRAVENOUS | Status: AC
  Administered 2018-07-21 (×4): 10 meq via INTRAVENOUS
  Filled 2018-07-21 (×4): qty 100

## 2018-07-21 MED ORDER — FENTANYL CITRATE (PF) 100 MCG/2ML IJ SOLN
25.0000 ug | INTRAMUSCULAR | Status: DC | PRN
  Administered 2018-07-21: 50 ug via INTRAVENOUS
  Administered 2018-07-21 (×2): 25 ug via INTRAVENOUS

## 2018-07-21 MED ORDER — SUGAMMADEX SODIUM 200 MG/2ML IV SOLN
INTRAVENOUS | Status: DC | PRN
  Administered 2018-07-21: 400 mg via INTRAVENOUS

## 2018-07-21 MED ORDER — SODIUM CHLORIDE 0.9 % IV SOLN
INTRAVENOUS | Status: DC | PRN
  Administered 2018-07-21 (×2): via INTRAVENOUS

## 2018-07-21 MED ORDER — ONDANSETRON HCL 4 MG/2ML IJ SOLN: 4.0000 mg | Freq: Four times a day (QID) | INTRAMUSCULAR | Status: DC | PRN

## 2018-07-21 MED ORDER — PROPOFOL 10 MG/ML IV BOLUS
INTRAVENOUS | Status: DC | PRN
  Administered 2018-07-21: 80 mg via INTRAVENOUS

## 2018-07-21 MED ORDER — SODIUM CHLORIDE 0.9 % IV SOLN
INTRAVENOUS | Status: AC
  Filled 2018-07-21: qty 2

## 2018-07-21 MED ORDER — SODIUM CHLORIDE 0.9 % IV SOLN
INTRAVENOUS | Status: DC
  Administered 2018-07-21: 75 mL via INTRAVENOUS
  Administered 2018-07-21 – 2018-07-25 (×8): via INTRAVENOUS

## 2018-07-21 MED ORDER — FENTANYL CITRATE (PF) 100 MCG/2ML IJ SOLN
INTRAMUSCULAR | Status: DC | PRN
  Administered 2018-07-21 (×4): 50 ug via INTRAVENOUS
  Administered 2018-07-21: 75 ug via INTRAVENOUS
  Administered 2018-07-21: 50 ug via INTRAVENOUS
  Administered 2018-07-21: 25 ug via INTRAVENOUS

## 2018-07-21 MED ORDER — SUCCINYLCHOLINE CHLORIDE 200 MG/10ML IV SOSY
PREFILLED_SYRINGE | INTRAVENOUS | Status: AC
  Filled 2018-07-21: qty 10

## 2018-07-21 MED ORDER — SODIUM CHLORIDE 0.9 % IV SOLN
INTRAVENOUS | Status: DC
  Administered 2018-07-21: 1000 mL via INTRAVENOUS

## 2018-07-21 MED ORDER — PANTOPRAZOLE SODIUM 40 MG IV SOLR
40.0000 mg | Freq: Two times a day (BID) | INTRAVENOUS | Status: DC
  Administered 2018-07-21 – 2018-07-22 (×3): 40 mg via INTRAVENOUS
  Filled 2018-07-21 (×3): qty 40

## 2018-07-21 MED ORDER — SUGAMMADEX SODIUM 500 MG/5ML IV SOLN
INTRAVENOUS | Status: AC
  Filled 2018-07-21: qty 5

## 2018-07-21 MED ORDER — DIPHENHYDRAMINE HCL 12.5 MG/5ML PO ELIX: 12.5000 mg | ORAL_SOLUTION | Freq: Four times a day (QID) | ORAL | Status: DC | PRN

## 2018-07-21 MED ORDER — HYDROMORPHONE 1 MG/ML IV SOLN
INTRAVENOUS | Status: AC
  Filled 2018-07-21: qty 25

## 2018-07-21 MED ORDER — NALOXONE HCL 0.4 MG/ML IJ SOLN: 0.4000 mg | INTRAMUSCULAR | Status: DC | PRN

## 2018-07-21 MED ORDER — PROPOFOL 10 MG/ML IV BOLUS
INTRAVENOUS | Status: AC
  Filled 2018-07-21: qty 20

## 2018-07-21 MED ORDER — SODIUM CHLORIDE 0.9% FLUSH: 9.0000 mL | INTRAVENOUS | Status: DC | PRN

## 2018-07-21 MED ORDER — HYDROMORPHONE 1 MG/ML IV SOLN
INTRAVENOUS | Status: DC
  Administered 2018-07-21: 15:00:00 via INTRAVENOUS
  Administered 2018-07-21 – 2018-07-22 (×3): 0.6 mg via INTRAVENOUS
  Administered 2018-07-22: 0.9 mg via INTRAVENOUS
  Administered 2018-07-22: 0.6 mg via INTRAVENOUS

## 2018-07-21 MED ORDER — ONDANSETRON HCL 4 MG/2ML IJ SOLN
4.0000 mg | Freq: Four times a day (QID) | INTRAMUSCULAR | Status: DC | PRN
  Administered 2018-07-21 – 2018-07-26 (×4): 4 mg via INTRAVENOUS
  Filled 2018-07-21 (×4): qty 2

## 2018-07-21 MED ORDER — LIDOCAINE 2% (20 MG/ML) 5 ML SYRINGE
INTRAMUSCULAR | Status: AC
  Filled 2018-07-21: qty 5

## 2018-07-21 MED ORDER — SODIUM CHLORIDE 0.9 % IV SOLN
2.0000 g | Freq: Two times a day (BID) | INTRAVENOUS | Status: DC
  Administered 2018-07-21 – 2018-07-25 (×8): 2 g via INTRAVENOUS
  Filled 2018-07-21 (×14): qty 2

## 2018-07-21 MED ORDER — ORAL CARE MOUTH RINSE
15.0000 mL | Freq: Two times a day (BID) | OROMUCOSAL | Status: DC
  Administered 2018-07-22 – 2018-07-31 (×17): 15 mL via OROMUCOSAL

## 2018-07-21 MED ORDER — OXYCODONE HCL 5 MG/5ML PO SOLN: 5.0000 mg | Freq: Once | ORAL | Status: DC | PRN

## 2018-07-21 MED ORDER — OXYCODONE HCL 5 MG PO TABS: 5.0000 mg | ORAL_TABLET | Freq: Once | ORAL | Status: DC | PRN

## 2018-07-21 MED ORDER — CHLORHEXIDINE GLUCONATE 0.12 % MT SOLN
15.0000 mL | Freq: Two times a day (BID) | OROMUCOSAL | Status: DC
  Administered 2018-07-21 – 2018-07-31 (×19): 15 mL via OROMUCOSAL
  Filled 2018-07-21 (×17): qty 15

## 2018-07-21 MED ORDER — HYDROMORPHONE HCL 1 MG/ML IJ SOLN
0.5000 mg | INTRAMUSCULAR | Status: DC | PRN
  Administered 2018-07-21 (×2): 0.5 mg via INTRAVENOUS
  Filled 2018-07-21 (×2): qty 0.5

## 2018-07-21 MED ORDER — INSULIN REGULAR HUMAN 100 UNIT/ML IJ SOLN
INTRAMUSCULAR | Status: DC
  Administered 2018-07-21: 5.6 [IU]/h via INTRAVENOUS
  Administered 2018-07-21: 4.2 [IU]/h via INTRAVENOUS
  Filled 2018-07-21 (×2): qty 1

## 2018-07-21 MED ORDER — SODIUM CHLORIDE 0.9 % IV SOLN
2.0000 g | INTRAVENOUS | Status: AC
  Administered 2018-07-21: 2 g via INTRAVENOUS
  Filled 2018-07-21: qty 2

## 2018-07-21 MED ORDER — IPRATROPIUM-ALBUTEROL 0.5-2.5 (3) MG/3ML IN SOLN: 3.0000 mL | RESPIRATORY_TRACT | Status: DC | PRN

## 2018-07-21 MED ORDER — LIDOCAINE 5 % EX PTCH
1.0000 | MEDICATED_PATCH | CUTANEOUS | Status: DC
  Administered 2018-07-21 – 2018-07-29 (×9): 1 via TRANSDERMAL
  Filled 2018-07-21 (×11): qty 1

## 2018-07-21 MED ORDER — ORAL CARE MOUTH RINSE: 15.0000 mL | Freq: Two times a day (BID) | OROMUCOSAL | Status: DC

## 2018-07-21 MED ORDER — LIDOCAINE HCL (CARDIAC) PF 100 MG/5ML IV SOSY
PREFILLED_SYRINGE | INTRAVENOUS | Status: DC | PRN
  Administered 2018-07-21: 80 mg via INTRAVENOUS

## 2018-07-21 MED ORDER — 0.9 % SODIUM CHLORIDE (POUR BTL) OPTIME
TOPICAL | Status: DC | PRN
  Administered 2018-07-21: 2000 mL

## 2018-07-21 MED ORDER — FENTANYL CITRATE (PF) 100 MCG/2ML IJ SOLN: 25.0000 ug | INTRAMUSCULAR | Status: DC | PRN

## 2018-07-21 MED ORDER — SUCCINYLCHOLINE CHLORIDE 20 MG/ML IJ SOLN
INTRAMUSCULAR | Status: DC | PRN
  Administered 2018-07-21: 80 mg via INTRAVENOUS

## 2018-07-21 MED ORDER — ROCURONIUM BROMIDE 100 MG/10ML IV SOLN
INTRAVENOUS | Status: DC | PRN
  Administered 2018-07-21 (×2): 50 mg via INTRAVENOUS

## 2018-07-21 MED ORDER — DIPHENHYDRAMINE HCL 50 MG/ML IJ SOLN: 12.5000 mg | Freq: Four times a day (QID) | INTRAMUSCULAR | Status: DC | PRN

## 2018-07-21 MED ORDER — ACETAMINOPHEN 10 MG/ML IV SOLN
1000.0000 mg | Freq: Four times a day (QID) | INTRAVENOUS | Status: AC | PRN
  Administered 2018-07-21: 1000 mg via INTRAVENOUS
  Filled 2018-07-21: qty 100

## 2018-07-21 SURGICAL SUPPLY — 43 items
BLADE CLIPPER SURG (BLADE) IMPLANT
CANISTER SUCT 3000ML PPV (MISCELLANEOUS) ×3 IMPLANT
CHLORAPREP W/TINT 26ML (MISCELLANEOUS) ×3 IMPLANT
COVER SURGICAL LIGHT HANDLE (MISCELLANEOUS) ×3 IMPLANT
DRAPE LAPAROSCOPIC ABDOMINAL (DRAPES) ×3 IMPLANT
DRAPE WARM FLUID 44X44 (DRAPE) ×3 IMPLANT
DRSG OPSITE POSTOP 4X10 (GAUZE/BANDAGES/DRESSINGS) IMPLANT
DRSG OPSITE POSTOP 4X8 (GAUZE/BANDAGES/DRESSINGS) IMPLANT
DRSG VAC ATS LRG SENSATRAC (GAUZE/BANDAGES/DRESSINGS) ×2 IMPLANT
ELECT BLADE 6.5 EXT (BLADE) IMPLANT
ELECT CAUTERY BLADE 6.4 (BLADE) ×3 IMPLANT
ELECT REM PT RETURN 9FT ADLT (ELECTROSURGICAL) ×3
ELECTRODE REM PT RTRN 9FT ADLT (ELECTROSURGICAL) ×1 IMPLANT
GLOVE BIO SURGEON STRL SZ8 (GLOVE) ×3 IMPLANT
GLOVE BIOGEL PI IND STRL 8 (GLOVE) ×1 IMPLANT
GLOVE BIOGEL PI INDICATOR 8 (GLOVE) ×2
GOWN STRL REUS W/ TWL LRG LVL3 (GOWN DISPOSABLE) ×1 IMPLANT
GOWN STRL REUS W/ TWL XL LVL3 (GOWN DISPOSABLE) ×1 IMPLANT
GOWN STRL REUS W/TWL LRG LVL3 (GOWN DISPOSABLE) ×3
GOWN STRL REUS W/TWL XL LVL3 (GOWN DISPOSABLE) ×3
KIT BASIN OR (CUSTOM PROCEDURE TRAY) ×3 IMPLANT
KIT TURNOVER KIT B (KITS) ×3 IMPLANT
LIGASURE IMPACT 36 18CM CVD LR (INSTRUMENTS) IMPLANT
NS IRRIG 1000ML POUR BTL (IV SOLUTION) ×6 IMPLANT
PACK GENERAL/GYN (CUSTOM PROCEDURE TRAY) ×3 IMPLANT
PAD ARMBOARD 7.5X6 YLW CONV (MISCELLANEOUS) ×3 IMPLANT
RELOAD PROXIMATE 75MM BLUE (ENDOMECHANICALS) ×6 IMPLANT
RELOAD STAPLE 75 3.8 BLU REG (ENDOMECHANICALS) IMPLANT
SPECIMEN JAR LARGE (MISCELLANEOUS) IMPLANT
SPONGE LAP 18X18 X RAY DECT (DISPOSABLE) IMPLANT
STAPLER GUN LINEAR PROX 60 (STAPLE) ×2 IMPLANT
STAPLER PROXIMATE 75MM BLUE (STAPLE) ×2 IMPLANT
STAPLER VISISTAT 35W (STAPLE) ×3 IMPLANT
SUCTION POOLE TIP (SUCTIONS) ×3 IMPLANT
SUT PDS AB 1 TP1 96 (SUTURE) ×6 IMPLANT
SUT VIC AB 2-0 SH 18 (SUTURE) ×3 IMPLANT
SUT VIC AB 3-0 SH 18 (SUTURE) ×3 IMPLANT
SUT VICRYL AB 2 0 TIES (SUTURE) ×3 IMPLANT
SUT VICRYL AB 3 0 TIES (SUTURE) ×3 IMPLANT
TOWEL OR 17X24 6PK STRL BLUE (TOWEL DISPOSABLE) ×3 IMPLANT
TOWEL OR 17X26 10 PK STRL BLUE (TOWEL DISPOSABLE) ×3 IMPLANT
TRAY FOLEY MTR SLVR 16FR STAT (SET/KITS/TRAYS/PACK) IMPLANT
YANKAUER SUCT BULB TIP NO VENT (SUCTIONS) IMPLANT

## 2018-07-21 NOTE — Consult Note (Signed)
Consultation  Referring Provider:     Dr. Jimmey Ralph Primary Care Physician:  [name], MD Primary Gastroenterologist:        Dr. Lyndel Safe Reason for Consultation:     Anemia, GI bleeding         HPI:   Lawrence [name] is a 72 y.o. male with a history of CAD s/p CABG, atrial fibrillation on Eliquis, DM, pacemaker in place, history of anemia, admitted to the hospital with worsening anemia, GI consulted to assist in management.  Patient has a history of chronic anemia with heme positive stools. He reports his stools are chronically dark as he takes iron. He's had multiple blood transfusions this year for anemia. He was seen by Dr. Lyndel Safe of our practice on 7/16, and was scheduled for an outpatient EGD and colonoscopy. He's had a workup in 2013 with EGD, colonoscopy, and capsule endoscopy. EGD showed small HH, colonoscopy showed a few polyps and diverticulosis. 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 He takes protonix chronically.  He reports yesterday new onset of nausea / vomiting / abdominal pain. He reports multiple episodes of emesis without blood. He otherwise was noted to have extremely dark bowel movements yesterday (2 yesterday, one overnight) which were much darker than usual and brought the patient to the ER. Was seen in [facility]. He reports severe mid abdominal pain since yesterday which is new. Patient unable to get comfortable and reports he feels miserable due to pain.   In ED was noted to have Hgb in 8s (basedline 9-11s), given reversal agent for Eliquis, and given PRBC transfusion and we were called for transfer to our hospital. He denies any further bowel movements overnight. He had another episode of nonbloody emesis while at bedside. Hemodynamics stable. WBC rose to 24 and lactate of 4.4. Xray done overnight showing stool in the colon and possible ileus vs. SBO. Hgb has risen to 10s. He also has AKI with marked rise in BUN.   Past Medical History:    Diagnosis Date  . Anemia   . Chronic airway obstruction, not elsewhere classified   . Chronic ischemic heart disease, unspecified   . Coronary artery disease   . Diabetes mellitus    type II, neuropathy,   . Essential hypertension, benign   . Hyperlipidemia, mixed   . Nodule of kidney    incidentally found on CT abdomen 11/2012.  Pending Urology evaluation.   . Nontoxic multinodular goiter   . Obesity   . Other testicular hypofunction   . PUD (peptic ulcer disease)   . Renal insufficiency, mild    hx of  . Sleep apnea    uses cpap    Past Surgical History:  Procedure Laterality Date  . CHOLECYSTECTOMY    . COLONOSCOPY  05/11/2015   Colonic polyps statuts post polypectomy. Moderate predominalty sigmoid diverticulosis. Internal hemorrhoids. Tubular adenoma.   . CORONARY ARTERY BYPASS GRAFT  2007   x 4  . ESOPHAGOGASTRODUODENOSCOPY     10/06/2012:  hiatal hernia, moderate gastritis.  2012: Colonoscopy at [facility]:  one polyp.  Due next 2017  . EYE SURGERY    . KNEE DEBRIDEMENT    . PACEMAKER IMPLANT N/A 08/22/2017   Procedure: Pacemaker Implant;  Surgeon: Constance Haw, MD;  Location: [facility];  Service: Cardiovascular;  Laterality: N/A;  . SHOULDER SURGERY     left shoulder  . TONSILECTOMY, ADENOIDECTOMY, BILATERAL MYRINGOTOMY AND TUBES      Family  History  Problem Relation Age of Onset  . Colon cancer Mother   . Hypertension Mother   . Kidney cancer Mother   . Cancer Mother        colon  . Esophageal cancer Father   . Stroke Father   . Cancer Father        esophageal  . Asthma Sister   . Emphysema Unknown      Social History   Tobacco Use  . Smoking status: Former Smoker    Packs/day: 1.50    Years: 30.00    Pack years: 45.00    Types: Cigarettes    Last attempt to quit: 2006    Years since quitting: 13.5  . Smokeless tobacco: Former Systems developer    Types: Chew  Substance Use Topics  . Alcohol use: Yes    Comment: rare  . Drug use: No     Prior to Admission medications   Medication Sig Start Date End Date Taking? Authorizing Provider  acetaminophen (TYLENOL) 500 MG tablet Take 2,000 mg by mouth every 6 (six) hours as needed for moderate pain or headache.    [provider]  apixaban (ELIQUIS) 5 MG TABS tablet Take 5 mg by mouth 2 (two) times daily.    [provider]  Cholecalciferol (VITAMIN D3) 2000 units TABS Take 2,000 Units by mouth daily.    [provider]  furosemide (LASIX) 40 MG tablet Take 40 mg by mouth 2 (two) times daily.     [provider]  insulin glargine (LANTUS) 100 unit/mL SOPN Inject 2-10 Units into the skin daily as needed (for BS > 110 per sliding scale).    [provider]  Ipratropium-Albuterol (COMBIVENT RESPIMAT) 20-100 MCG/ACT AERS respimat Inhale 1 puff into the lungs daily as needed for wheezing.    [provider]  ipratropium-albuterol (DUONEB) 0.5-2.5 (3) MG/3ML SOLN Take 3 mLs by nebulization every 6 (six) hours as needed (for shortness of breath or wheezing).     [provider]  metolazone (ZAROXOLYN) 2.5 MG tablet Take 1 tablet (2.5 mg total) by mouth daily. Takes once a day 3 times per week/ m-w-f Patient taking differently: Take 2.5 mg by mouth every Monday, Wednesday, and Friday.  04/04/18   Park Liter, MD  Multiple Vitamins-Minerals (DRY EYE FORMULA PO) Place 2 drops into both eyes daily as needed (for dry eyes).  11/24/17   [provider]  nitroGLYCERIN (NITROSTAT) 0.4 MG SL tablet Place 0.4 mg under the tongue every 5 (five) minutes as needed for chest pain.     [provider]  pantoprazole (PROTONIX) 40 MG tablet Take 40 mg by mouth daily before breakfast.     [provider]  simvastatin (ZOCOR) 20 MG tablet Take 20 mg by mouth at bedtime.     [provider]  vitamin B-12 (CYANOCOBALAMIN) 1000 MCG tablet Take 1,000 mcg by mouth daily.    [provider]    Current  Facility-Administered Medications  Medication Dose Route Frequency Provider Last Rate Last Dose  . 0.9 %  sodium chloride infusion  250 mL Intravenous PRN Omar Person, NP      . acetaminophen (OFIRMEV) IV 1,000 mg  1,000 mg Intravenous Q6H PRN Hammonds, Sharyn Blitz, MD 400 mL/hr at 07/21/18 0741 1,000 mg at 07/21/18 0741  . HYDROmorphone (DILAUDID) injection 0.5 mg  0.5 mg Intravenous Q3H PRN Hammonds, Sharyn Blitz, MD   0.5 mg at 07/21/18 0520  . insulin regular (NOVOLIN  R,HUMULIN R) 100 Units in sodium chloride 0.9 % 100 mL (1 Units/mL) infusion   Intravenous Continuous Omar Person, NP 8.5 mL/hr at 07/21/18 0739 8.5 Units/hr at 07/21/18 0739  . ipratropium-albuterol (DUONEB) 0.5-2.5 (3) MG/3ML nebulizer solution 3 mL  3 mL Nebulization Q4H PRN Omar Person, NP      . lidocaine (LIDODERM) 5 % 1 patch  1 patch Transdermal Q24H Hammonds, Sharyn Blitz, MD   1 patch at 07/21/18 0520  . ondansetron (ZOFRAN) injection 4 mg  4 mg Intravenous Q6H PRN Omar Person, NP   4 mg at 07/21/18 0520  . pantoprazole (PROTONIX) injection 40 mg  40 mg Intravenous Q12H Hayden Pedro M, NP      . potassium chloride 10 mEq in 100 mL IVPB  10 mEq Intravenous Q1 Hr x 4 Hayden Pedro M, NP 100 mL/hr at 07/21/18 0706 10 mEq at 07/21/18 0706    Allergies as of 07/21/2018 - Review Complete 07/21/2018  Allergen Reaction Noted  . Bactrim [sulfamethoxazole-trimethoprim] Other (See Comments) 06/25/2017     Review of Systems:    As per HPI, otherwise negative    Physical Exam:  Vital signs in last 24 hours: Temp:  [97.8 F (36.6 C)-98 F (36.7 C)] 97.8 F (36.6 C) (07/29 0700) Pulse Rate:  [62-68] 62 (07/29 0600) Resp:  [18-19] 18 (07/29 0600) BP: (112-150)/(54-60) 150/57 (07/29 0600) SpO2:  [96 %-100 %] 100 % (07/29 0600) Weight:  [229 lb 15 oz (104.3 kg)] 229 lb 15 oz (104.3 kg) (07/29 0410)   General:   Pleasant male, vomiting in bed with emesis bag Head:  Normocephalic and  atraumatic. Eyes:   No icterus.    Ears:  Normal auditory acuity. Neck:  Supple Lungs: Respirations even and unlabored. Lungs clear to auscultation bilaterally.    Heart:  Regular rate and rhythm; no MRG Abdomen:  Soft, protuberant, severely tender to mild palpation of the mid abdomen. . No appreciable masses   Rectal:  Not performed.  Msk:  Symmetrical without gross deformities.  Extremities:  Without edema. Neurologic:  Alert and  oriented x4;  grossly normal neurologically. Skin:  Intact without significant lesions or rashes. Psych:  Alert and cooperative. Normal affect.  LAB RESULTS: Recent Labs    07/21/18 0444  WBC 24.2*  HGB 10.4*  HCT 31.4*  PLT 364   BMET Recent Labs    07/21/18 0444  NA 133*  K 3.3*  CL 91*  CO2 24  GLUCOSE 484*  BUN 121*  CREATININE 2.67*  CALCIUM 9.3   LFT Recent Labs    07/21/18 0444  PROT 6.7  ALBUMIN 3.3*  AST 49*  ALT 33  ALKPHOS 239*  BILITOT 1.0   PT/INR No results for input(s): LABPROT, INR in the last 72 hours.  STUDIES: Dg Abd Portable 1v  Result Date: 07/21/2018 CLINICAL DATA:  Abdominal pain vomiting tonight. EXAM: PORTABLE ABDOMEN - 1 VIEW COMPARISON:  CT abdomen and pelvis December 01, 2012 FINDINGS: Moderate amount of retained large bowel stool. Moderate amount of retained large bowel stool. A few loops of gas distended small large bowel. Surgical clips in the included right abdomen compatible with cholecystectomy. Phleboliths in the pelvis. Mild vascular calcifications. Mild dextroscoliosis. Ankylosis of the sacroiliac joints. Osteopenia. IMPRESSION: Moderate amount of retained large bowel stool with mild probable ileus, less likely early small bowel obstruction. Electronically Signed   By: Elon Alas M.D.   On: 07/21/2018 06:14  Impression / Plan:  72 y/o male with history of CAD s/p CABG, atrial fibrillation on Eliquis, DM, pacemaker in place, history of recurrent anemia with heme positive stools  (remote workup for this issue yielded findings of small bowel AVMs), admitted to the hospital with worsening anemia and concern for melena. Also noted to have severe mid abdominal pain, vomiting, leukocytosis and elevated lactate. Xray concerning for possible SBO / ileus.  His anemia is chronic and he has had some mild worsening of it with some dark stools which does warrant endoscopic evaluation, however I'm more concerned acutely about his severe pain / vomiting / leukocytosis / rising lactate. Please keep the patient NPO. Recommend noncontrast CT abdomen pelvis now to further evaluate his pain, as he won't tolerate contrast given vomiting and AKI. Will see what this shows initially, will need NG if obstructed. Agree with PPI in the interim. His vitals are stable and Hgb has responded appropriately to transfusion. Timing of endoscopic evaluation pending his CT findings and course today, he's stable currently from a bleeding perspective. Of note, chronic AP elevation noted and mild AST elevation, will await CT.  Please call with questions, will follow.   [place], MD [facility]

## 2018-07-21 NOTE — Anesthesia Postprocedure Evaluation (Signed)
Anesthesia Post Note  Patient: Lawrence [name]  Procedure(s) Performed: EXPLORATORY LAPAROTOMY WITH SMALL BOWEL RESECTION (N/A Abdomen)     Patient location during evaluation: PACU Anesthesia Type: General Level of consciousness: awake and alert and oriented Pain management: pain level controlled Vital Signs Assessment: post-procedure vital signs reviewed and stable Respiratory status: spontaneous breathing, nonlabored ventilation, respiratory function stable and patient connected to nasal cannula oxygen Cardiovascular status: blood pressure returned to baseline and stable Postop Assessment: no apparent nausea or vomiting Anesthetic complications: no    Last Vitals:  Vitals:   07/21/18 1400 07/21/18 1515  BP:    Pulse:    Resp:  14  Temp: (!) 36.4 C   SpO2:  100%    Last Pain:  Vitals:   07/21/18 1515  TempSrc:   PainSc: 5                  Jlyn Bracamonte A.

## 2018-07-21 NOTE — Progress Notes (Signed)
Dr Havery Moros alerted by radiologist that pt has ischemic SB. Call into surgery for consult.    Azucena Freed PA-C [phone].

## 2018-07-21 NOTE — Progress Notes (Addendum)
Pt back from OR - CBG 111 turned off insulin gtt - call to CCM - order to follow Phase 2 ICU Glycemic Control protocol - after 6 CBG's less than 250 than can transition to phase 3

## 2018-07-21 NOTE — H&P (Signed)
PULMONARY / CRITICAL CARE MEDICINE   Name: Lawrence [name] MRN: [id number] DOB: 1946-11-22    ADMISSION DATE:  07/21/2018 CONSULTATION DATE:  07/21/2018  REFERRING MD:  [facility]   CHIEF COMPLAINT:  Anemia   HISTORY OF PRESENT ILLNESS:   72 year old male with PMH of COPD, DM, HLD, HTN, CAD, S/P Pacemaker, A.Fib on Eliquis, Diastolic HF, OSA on CPAP at HS, H/O AVM Colonic Polys and Moderate Sigmoid Diverticulosis, GERD  Presents to [facility] ED on 7/29 with Abdominal Pain, Nausea, and reportedly two days of Hematochezia in which appeared black/tarry. Hemoglobin 8.8. Given 1 unit RBC at OSH. Eliquis reversed with Anti-Inhibitor Coagulant Complex. Patient transferred to [facility] for Further Evaluation.   On arrival to ICU patient is alert and oriented with systolic 226. On insulin gtt. Complaining of abdominal tenderness and nausea.   PAST MEDICAL HISTORY :  He  has a past medical history of Anemia, Chronic airway obstruction, not elsewhere classified, Chronic ischemic heart disease, unspecified, Coronary artery disease, Diabetes mellitus, Essential hypertension, benign, Hyperlipidemia, mixed, Nodule of kidney, Nontoxic multinodular goiter, Obesity, Other testicular hypofunction, PUD (peptic ulcer disease), Renal insufficiency, mild, and Sleep apnea.  PAST SURGICAL HISTORY: He  has a past surgical history that includes Shoulder surgery; Coronary artery bypass graft (2007); Tonsilectomy, adenoidectomy, bilateral myringotomy and tubes; Esophagogastroduodenoscopy; Knee debridement; Eye surgery; Cholecystectomy; PACEMAKER IMPLANT (N/A, 08/22/2017); and Colonoscopy (05/11/2015).  Allergies  Allergen Reactions  . Bactrim [Sulfamethoxazole-Trimethoprim] Other (See Comments)    Heart to sleep, renal failure, potassium level spiked    No current facility-administered medications on file prior to encounter.    Current Outpatient Medications on File Prior to Encounter  Medication Sig  .  acetaminophen (TYLENOL) 500 MG tablet Take 2,000 mg by mouth every 6 (six) hours as needed for moderate pain or headache.  Marland Kitchen apixaban (ELIQUIS) 5 MG TABS tablet Take 5 mg by mouth 2 (two) times daily.  . Cholecalciferol (VITAMIN D3) 2000 units TABS Take 2,000 Units by mouth daily.  . furosemide (LASIX) 40 MG tablet Take 40 mg by mouth 2 (two) times daily.   . insulin glargine (LANTUS) 100 unit/mL SOPN Inject 2-10 Units into the skin daily as needed (for BS > 110 per sliding scale).  . Ipratropium-Albuterol (COMBIVENT RESPIMAT) 20-100 MCG/ACT AERS respimat Inhale 1 puff into the lungs daily as needed for wheezing.  Marland Kitchen ipratropium-albuterol (DUONEB) 0.5-2.5 (3) MG/3ML SOLN Take 3 mLs by nebulization every 6 (six) hours as needed (for shortness of breath or wheezing).   . metolazone (ZAROXOLYN) 2.5 MG tablet Take 1 tablet (2.5 mg total) by mouth daily. Takes once a day 3 times per week/ m-w-f (Patient taking differently: Take 2.5 mg by mouth every Monday, Wednesday, and Friday. )  . Multiple Vitamins-Minerals (DRY EYE FORMULA PO) Place 2 drops into both eyes daily as needed (for dry eyes).   . nitroGLYCERIN (NITROSTAT) 0.4 MG SL tablet Place 0.4 mg under the tongue every 5 (five) minutes as needed for chest pain.   . pantoprazole (PROTONIX) 40 MG tablet Take 40 mg by mouth daily before breakfast.   . simvastatin (ZOCOR) 20 MG tablet Take 20 mg by mouth at bedtime.   . vitamin B-12 (CYANOCOBALAMIN) 1000 MCG tablet Take 1,000 mcg by mouth daily.    FAMILY HISTORY:  His family history includes Asthma in his sister; Cancer in his father and mother; Colon cancer in his mother; Emphysema in his unknown relative; Esophageal cancer in his father; Hypertension in his mother;  Kidney cancer in his mother; Stroke in his father.  SOCIAL HISTORY: He  reports that he quit smoking about 13 years ago. His smoking use included cigarettes. He has a 45.00 pack-year smoking history. He has quit using smokeless tobacco.  His smokeless tobacco use included chew. He reports that he drinks alcohol. He reports that he does not use drugs.  REVIEW OF SYSTEMS:   All negative; except for those that are bolded, which indicate positives.  Constitutional: weight loss, weight gain, night sweats, fevers, chills, fatigue, weakness.  HEENT: headaches, sore throat, sneezing, nasal congestion, post nasal drip, difficulty swallowing, tooth/dental problems, visual complaints, visual changes, ear aches. Neuro: difficulty with speech, weakness, numbness, ataxia. CV:  chest pain, orthopnea, PND, swelling in lower extremities, dizziness, palpitations, syncope.  Resp: cough, hemoptysis, dyspnea, wheezing. GI: heartburn, indigestion, abdominal pain, nausea, vomiting, diarrhea, constipation, change in bowel habits, loss of appetite, hematemesis, melena, hematochezia.  GU: dysuria, change in color of urine, urgency or frequency, flank pain, hematuria. MSK: joint pain or swelling, decreased range of motion. Psych: change in mood or affect, depression, anxiety, suicidal ideations, homicidal ideations. Skin: rash, itching, bruising.   SUBJECTIVE:   VITAL SIGNS: BP (!) 141/60 (BP Location: Left Arm)   Pulse 68   Temp 98 F (36.7 C) (Oral)   Resp 19   Ht 6' (1.829 m)   Wt 104.3 kg (229 lb 15 oz)   SpO2 97%   BMI 31.19 kg/m   HEMODYNAMICS:    VENTILATOR SETTINGS:    INTAKE / OUTPUT: No intake/output data recorded.  PHYSICAL EXAMINATION: General:  Elderly male, no distress  Neuro:  Alert, oriented, follows commands  HEENT:  Dry MM  Cardiovascular:  Paced, no MRG Lungs:  Clear breath sounds, no wheeze/crackles  Abdomen:  Tenderness noted, active bowel sounds  Musculoskeletal:  -edema  Skin:  Warm, dry   LABS:  BMET No results for input(s): NA, K, CL, CO2, BUN, CREATININE, GLUCOSE in the last 168 hours.  Electrolytes No results for input(s): CALCIUM, MG, PHOS in the last 168 hours.  CBC No results for  input(s): WBC, HGB, HCT, PLT in the last 168 hours.  Coag's No results for input(s): APTT, INR in the last 168 hours.  Sepsis Markers No results for input(s): LATICACIDVEN, PROCALCITON, O2SATVEN in the last 168 hours.  ABG No results for input(s): PHART, PCO2ART, PO2ART in the last 168 hours.  Liver Enzymes No results for input(s): AST, ALT, ALKPHOS, BILITOT, ALBUMIN in the last 168 hours.  Cardiac Enzymes No results for input(s): TROPONINI, PROBNP in the last 168 hours.  Glucose Recent Labs  Lab 07/21/18 0420  GLUCAP 480*    Imaging No results found.   STUDIES:  None.   CULTURES: Blood 7/29 > U/A 7/29 >  ANTIBIOTICS: None  SIGNIFICANT EVENTS: 7/29 > Presents to ED   LINES/TUBES: PIV   DISCUSSION: 72 year old male with PMH of AVM and Diverticulosis on Eliquis presents to ED with dark tarry stools with nausea and ABD pain.    ASSESSMENT / PLAN:  OSA, COPD P:   Wean Supplemental Oxygen to Maintain Saturation >92  Pulmonary Hygiene  CPAP at HS Duoneb PRN  Chronic Diastolic HF S/P Pacemaker  H/O A.Fib on Eliquis, HTN, CAD P:  Cardiac Monitoring  Hold Eliquis  Hold Zocor  EKG    Acute on CKD stage 3 ([place] Crt 1.8-1.9)  Crt 2.70 >> P:   Trend BMP Replace electrolytes as indicated  LA pending   Hematochezia >  Dark Tarry  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 Capsule Endoscopy) Colonic Polys and Moderate Sigmoid Diverticulosis  GERD -Followed by [facility], Planned Colonoscopy September  P:   GI Consult in AM  NPO PPI BID Zofran PRN   Acute Blood Loss Anemia  Iron Deficiency Anemia  -INR 1.1 -PT 11.2  -S/P Anti-Inhibitor Coagulant Complex at OSH -S/P 1 unit RBC  P:  Hold Anticoagulation  Trend CBC q6h SCDS  Leucocytosis suspected reactive  WBC 18.8 >  P:   Trend WBC and Fever Curve Follow Culture Data Hold antibiotics at this time      Hyperglycemia  H/O DM   P:   Trend Glucose Insulin Gtt Beta-H and Urine Studies Pending    Chronic Back Pain  Plan  -PRN Tylenol  -Lidoderm Patch    FAMILY  - Updates: Wife updated at bedside   - Inter-disciplinary family meet or Palliative Care meeting due by: 07/28/2018     Hayden Pedro, AGACNP-BC [place]  Pgr: [phone]  PCCM Pgr: [phone]

## 2018-07-21 NOTE — Anesthesia Preprocedure Evaluation (Signed)
Anesthesia Evaluation  Patient identified by MRN, date of birth, ID band Patient awake    Reviewed: Allergy & Precautions, NPO status , Patient's Chart, lab work & pertinent test results  History of Anesthesia Complications Negative for: history of anesthetic complications  Airway Mallampati: II  TM Distance: >3 FB Neck ROM: Full    Dental  (+) Dental Advisory Given   Pulmonary sleep apnea , COPD,  COPD inhaler, former smoker,    breath sounds clear to auscultation       Cardiovascular hypertension, Pt. on medications and Pt. on home beta blockers (-) angina+ CAD, + CABG and +CHF  + dysrhythmias Atrial Fibrillation + pacemaker  Rhythm:Regular     Neuro/Psych negative neurological ROS  negative psych ROS   GI/Hepatic PUD, SBO   Endo/Other  diabetes, Type 2, Insulin DependentMorbid obesity  Renal/GU CRF and ARFRenal disease     Musculoskeletal   Abdominal   Peds  Hematology  (+) anemia ,   Anesthesia Other Findings St jude pacemaker  Reproductive/Obstetrics                             Anesthesia Physical Anesthesia Plan  ASA: III and emergent  Anesthesia Plan: General   Post-op Pain Management:    Induction: Intravenous, Cricoid pressure planned and Rapid sequence  PONV Risk Score and Plan: 2 and Ondansetron and Dexamethasone  Airway Management Planned: Oral ETT  Additional Equipment: Arterial line, CVP and Ultrasound Guidance Line Placement  Intra-op Plan:   Post-operative Plan: Extubation in OR and Possible Post-op intubation/ventilation  Informed Consent: I have reviewed the patients History and Physical, chart, labs and discussed the procedure including the risks, benefits and alternatives for the proposed anesthesia with the patient or authorized representative who has indicated his/her understanding and acceptance.   Dental advisory given  Plan Discussed with: CRNA and  Surgeon  Anesthesia Plan Comments:         Anesthesia Quick Evaluation

## 2018-07-21 NOTE — Anesthesia Procedure Notes (Addendum)
Procedure Name: Intubation Date/Time: 07/21/2018 11:54 AM Performed by: Kyung Rudd, CRNA Pre-anesthesia Checklist: Patient identified, Emergency Drugs available, Suction available and Patient being monitored Patient Re-evaluated:Patient Re-evaluated prior to induction Oxygen Delivery Method: Circle system utilized Preoxygenation: Pre-oxygenation with 100% oxygen Induction Type: IV induction, Rapid sequence and Cricoid Pressure applied Laryngoscope Size: Mac and 4 Grade View: Grade I Tube type: Oral Tube size: 7.5 mm Number of attempts: 1 Airway Equipment and Method: Stylet Placement Confirmation: ETT inserted through vocal cords under direct vision,  positive ETCO2 and breath sounds checked- equal and bilateral Secured at: 21 cm Tube secured with: Tape Dental Injury: Teeth and Oropharynx as per pre-operative assessment

## 2018-07-21 NOTE — Progress Notes (Signed)
To OR per bed with tech and RN

## 2018-07-21 NOTE — Op Note (Signed)
Preoperative diagnosis: Ischemic small bowel  Postoperative diagnosis small bowel ischemia with necrotic terminal ileum  Procedure: Exploratory laparotomy small bowel resection and cecectomy   Surgeon Erroll Luna, MD  Assistant: Brigid Re, PA  Anesthesia: General  EBL: 50 cc  Drains: None  Specimen cecum with appendix and distal 40 cm of ileum to pathology with grossly ischemic changes  Indications for procedure: The patient is a 72 year old male with multiple significant comorbidities to include coronary artery disease, COPD, morbid obesity, hypertension, hyperlipidemia who presents from [place] yesterday with nausea vomiting abdominal pain.  They are concerned he was having cardiac issues therefore he was transferred for further evaluation.  He has had a 1 day history of severe abdominal pain with nausea and vomiting.  GI was consulted to evaluate his abdominal pain.  CT scan was obtained which showed portal venous gas and pneumatosis involving his small bowel.  His abdomen is tender on examination therefore recommend exploratory laparotomy for small bowel ischemia.The procedure has been discussed with the patient.  Alternative therapies have been discussed with the patient.  Operative risks include bleeding,  Infection,  Organ injury,  Nerve injury,  Blood vessel injury,  DVT,  Pulmonary embolism,  Death,  And possible reoperation.  Medical management risks include worsening of present situation.  The success of the procedure is 50 -90 % at treating patients symptoms.  The patient understands and agrees to proceed.    Description of procedure: The patient was met in the holding area.  Questions were answered.  He was taken back to the operating room after getting informed consent.  The pros and cons of surgery and risks were all discussed with the patient again.  He was placed supine upon the operating room.  General anesthesia initiated and a central line was placed by anesthesia.   Foley catheter was placed under direct vision.  After induction of general anesthesia the abdomen was prepped and draped in sterile fashion timeout was done.  Proper patient procedure were verified he received preoperative antibiotics.  Midline incision was used.  Dissection was carried down through subcutaneous fat to the midline fascia was identified.  This was opened in the midline for approximately 20 cm.  Upon entering there is turbid fluid but no signs of perforation.  Small bowel identified run from ligament of Treitz to the ileocecal valve.  The distal 40 cm of terminal ileum showed signs of patchy ischemia consistent with embolic disease.  It was grossly ischemic from the proximal ileum all the way to the ileocecal valve.  The cecum had signs of some embolic changes well.  I divided the ileum just distal to the jejunum.  We then divided the terminal ileum prior to the cecum.  This was all resected with the LigaSure.  Upon examination of the remaining ileum to the cecum, this did not appear healthy and appeared ischemic as well as the cecum itself.  The remainder of the ascending, transverse, left colon sigmoid colon and rectum were all grossly normal without signs of ischemia.  The remainder small bowel was normal.  Then performed a septectomy using a GIA 75 stapling device which was used for the small bowel resection.  We then fashioned a side-to-side functional end-to-end anastomosis with a GIA 75 stapler.  TA 60 used to close the common enterotomy defect.  It laid nicely without kinking twisting or tension.  Both ends had good blood flow.  There is no bleeding at the anastomosis.  The small bowel was not  twisted.  Mesenteric defect was closed with 2-0 and 3-0 Vicryl.  Irrigation was then used.  His liver, stomach and remainder of internal viscera appeared without signs of ischemia.  He had a good pulse in his SMA.  After irrigation was suctioned out all instruments were counted and found to be correct.   Fascia closed with double-stranded 1 PDS.  Wound VAC placed in the subcutaneous tissues.  All final counts were found to be correct.  The patient was hemodynamic stable.  He was then awoke extubated taken to recovery in satisfactory condition.

## 2018-07-21 NOTE — Anesthesia Procedure Notes (Signed)
Central Venous Catheter Insertion Performed by: Oleta Mouse, MD, anesthesiologist Start/End7/29/2019 11:02 AM, 07/21/2018 11:07 AM Patient location: OR. Preanesthetic checklist: patient identified, IV checked, site marked, risks and benefits discussed, surgical consent, monitors and equipment checked, pre-op evaluation, timeout performed and anesthesia consent Hand hygiene performed  and maximum sterile barriers used  Catheter size: 8 Fr Total catheter length 16. Central line was placed.Double lumen Procedure performed using ultrasound guided technique. Ultrasound Notes:anatomy identified, needle tip was noted to be adjacent to the nerve/plexus identified, no ultrasound evidence of intravascular and/or intraneural injection and image(s) printed for medical record Attempts: 1 Following insertion, dressing applied, line sutured and Biopatch. Post procedure assessment: blood return through all ports, no air and free fluid flow  Patient tolerated the procedure well with no immediate complications.

## 2018-07-21 NOTE — Progress Notes (Signed)
Lab called with Critical lab value- Lactic acid drawn at 0654 resulted 4.4 - text paged CCM

## 2018-07-21 NOTE — Anesthesia Procedure Notes (Signed)
Arterial Line Insertion Start/End7/28/2019 11:50 AM, 07/20/2018 11:55 AM Performed by: Josephine Igo, CRNA, CRNA  Patient location: OR. Preanesthetic checklist: patient identified, IV checked, site marked, risks and benefits discussed, surgical consent, monitors and equipment checked and pre-op evaluation Left, radial was placed Catheter size: 20 G Hand hygiene performed , maximum sterile barriers used  and Seldinger technique used Allen's test indicative of satisfactory collateral circulation Attempts: 1 Procedure performed without using ultrasound guided technique. Following insertion, Biopatch and dressing applied. Post procedure assessment: normal  Patient tolerated the procedure well with no immediate complications.

## 2018-07-21 NOTE — Progress Notes (Signed)
Pt. Refused cpap for tonight. RN was informed.

## 2018-07-21 NOTE — Progress Notes (Signed)
Pt transported to CT per SWOT RN accompanied due to Insulin gtt.

## 2018-07-21 NOTE — Progress Notes (Signed)
OR desk called to see if they can switch out wound vac - paged to Dr Brantley Stage -waiting on call back

## 2018-07-21 NOTE — Care Management (Signed)
  This is a no charge note  Transfer from [facility] per [employer].  I was called for transferring this patient to [facility] hospital due to GI bleeding and DKA.  Patient is a 72 year old male with past medical history of AVM, PUD, hypertension, hyperlipidemia, diabetes mellitus, GERD, CAD, atrial fibrillation on Eliquis, CKD-3, pacemaker placement, who presented [facility] with hematemesis, melena.  Patient was found to have hemoglobin dropped from 10 at 2 weeks ago to 8.8, hemodynamically stable with blood pressure 163/72, heart rate 65, respiration rate 20, oxygen saturation 92% on room air, normal temperature.  Patient was given 1 L normal saline bolus, started with Protonix drip, give 1 dose of Feiba. Will also transfuse 1U of blood per EDP.  Patient was also found to have DKA with anion gap 21, blood sugar 609, bicarbonate 26.  Therefore patient needs be placed in stepdown bed.  However we do not have stepdown bed available at this moment. I told EDP that if pt is stable and can wait until AM when we may have SDU available. I would like to accept to SDU as inpt. If pt is not stable enough to wait, I recommended the ED physician to consult PCCM for ICU placement due to complicated presentation.  Ivor Costa, MD  [facility] Pager [phone]  If 7PM-7AM, please contact night-coverage [email or web address] Password [facility] 07/21/2018, 8:41 AM

## 2018-07-21 NOTE — Plan of Care (Signed)
°  Problem: Clinical Measurements: °Goal: Ability to maintain clinical measurements within normal limits will improve °Outcome: Progressing °Goal: Diagnostic test results will improve °Outcome: Progressing °Goal: Cardiovascular complication will be avoided °Outcome: Progressing °  °

## 2018-07-21 NOTE — Transfer of Care (Signed)
Immediate Anesthesia Transfer of Care Note  Patient: Lawrence [name]  Procedure(s) Performed: EXPLORATORY LAPAROTOMY WITH SMALL BOWEL RESECTION (N/A Abdomen)  Patient Location: PACU  Anesthesia Type:General  Level of Consciousness: awake  Airway & Oxygen Therapy: Patient Spontanous Breathing and Patient connected to face mask oxygen  Post-op Assessment: Report given to RN, Post -op Vital signs reviewed and stable and Patient moving all extremities X 4  Post vital signs: Reviewed and stable  Last Vitals:  Vitals Value Taken Time  BP 88/64 07/21/2018  2:04 PM  Temp    Pulse 83 07/21/2018  2:11 PM  Resp 14 07/21/2018  2:11 PM  SpO2 96 % 07/21/2018  2:11 PM  Vitals shown include unvalidated device data.  Last Pain:  Vitals:   07/21/18 0943  TempSrc:   PainSc: 5       Patients Stated Pain Goal: 0 (04/88/89 1694)  Complications: No apparent anesthesia complications

## 2018-07-21 NOTE — Progress Notes (Signed)
Received report and transported patient to radiology department for CT exam. Assumed care of patient and monitored patient throughout transport and exam. Returned safely back to room. Bedside nurse resumed care at this time. 

## 2018-07-21 NOTE — Consult Note (Signed)
[facility] Consult Note  Lawrence [name] December 15, 1946  [id number].    Requesting MD: Hammonds Chief Complaint/Reason for Consult: small bowel ischemia  HPI:  Patient is a 72 year old male with multiple comorbidities who presented to [facility] 7/29 with abdominal pain, nausea, and 2 days of melena. Patient is on eliquis for atrial fibrillation, given reversal agent yesterday, 1 unit PRBC and transferred to [facility] for further evaluation. Patient reports severe abdominal pain to me, generalized and constant. Patient reports he has had bloody stools intermittently for 3-4 years, noticed they were more tarry the last few days. Patient reports bilious emesis this AM. Allergic to bactrim. Past abdominal surgery includes cholecystectomy.   ROS: Review of Systems  Respiratory: Negative for shortness of breath and wheezing.   Cardiovascular: Negative for chest pain and palpitations.  Gastrointestinal: Positive for abdominal pain, blood in stool, melena, nausea and vomiting. Negative for constipation and diarrhea.  Genitourinary: Negative for dysuria, frequency and urgency.  Musculoskeletal: Positive for back pain (chronic).    Family History  Problem Relation Age of Onset  . Colon cancer Mother   . Hypertension Mother   . Kidney cancer Mother   . Cancer Mother        colon  . Esophageal cancer Father   . Stroke Father   . Cancer Father        esophageal  . Asthma Sister   . Emphysema Unknown     Past Medical History:  Diagnosis Date  . Anemia   . Chronic airway obstruction, not elsewhere classified   . Chronic ischemic heart disease, unspecified   . Coronary artery disease   . Diabetes mellitus    type II, neuropathy,   . Essential hypertension, benign   . Hyperlipidemia, mixed   . Nodule of kidney    incidentally found on CT abdomen 11/2012.  Pending Urology evaluation.   . Nontoxic multinodular goiter   . Obesity   . Other testicular hypofunction   .  PUD (peptic ulcer disease)   . Renal insufficiency, mild    hx of  . Sleep apnea    uses cpap    Past Surgical History:  Procedure Laterality Date  . CHOLECYSTECTOMY    . COLONOSCOPY  05/11/2015   Colonic polyps statuts post polypectomy. Moderate predominalty sigmoid diverticulosis. Internal hemorrhoids. Tubular adenoma.   . CORONARY ARTERY BYPASS GRAFT  2007   x 4  . ESOPHAGOGASTRODUODENOSCOPY     10/06/2012:  hiatal hernia, moderate gastritis.  2012: Colonoscopy at [facility]:  one polyp.  Due next 2017  . EYE SURGERY    . KNEE DEBRIDEMENT    . PACEMAKER IMPLANT N/A 08/22/2017   Procedure: Pacemaker Implant;  Surgeon: Constance Haw, MD;  Location: [facility];  Service: Cardiovascular;  Laterality: N/A;  . SHOULDER SURGERY     left shoulder  . TONSILECTOMY, ADENOIDECTOMY, BILATERAL MYRINGOTOMY AND TUBES      Social History:  reports that he quit smoking about 13 years ago. His smoking use included cigarettes. He has a 45.00 pack-year smoking history. He has quit using smokeless tobacco. His smokeless tobacco use included chew. He reports that he drinks alcohol. He reports that he does not use drugs.  Allergies:  Allergies  Allergen Reactions  . Bactrim [Sulfamethoxazole-Trimethoprim] Other (See Comments)    Heart to sleep, renal failure, potassium level spiked    Medications Prior to Admission  Medication Sig Dispense Refill  . acetaminophen (TYLENOL) 500 MG tablet  Take 2,000 mg by mouth every 6 (six) hours as needed for moderate pain or headache.    Marland Kitchen apixaban (ELIQUIS) 5 MG TABS tablet Take 5 mg by mouth 2 (two) times daily.    . Cholecalciferol (VITAMIN D3) 2000 units TABS Take 2,000 Units by mouth daily.    . furosemide (LASIX) 40 MG tablet Take 40 mg by mouth 2 (two) times daily.     . insulin glargine (LANTUS) 100 unit/mL SOPN Inject 2-10 Units into the skin daily as needed (for BS > 110 per sliding scale).    . Ipratropium-Albuterol (COMBIVENT RESPIMAT) 20-100  MCG/ACT AERS respimat Inhale 1 puff into the lungs daily as needed for wheezing.    Marland Kitchen ipratropium-albuterol (DUONEB) 0.5-2.5 (3) MG/3ML SOLN Take 3 mLs by nebulization every 6 (six) hours as needed (for shortness of breath or wheezing).     . metolazone (ZAROXOLYN) 2.5 MG tablet Take 1 tablet (2.5 mg total) by mouth daily. Takes once a day 3 times per week/ m-w-f (Patient taking differently: Take 2.5 mg by mouth every Monday, Wednesday, and Friday. ) 30 tablet 2  . Multiple Vitamins-Minerals (DRY EYE FORMULA PO) Place 2 drops into both eyes daily as needed (for dry eyes).     . nitroGLYCERIN (NITROSTAT) 0.4 MG SL tablet Place 0.4 mg under the tongue every 5 (five) minutes as needed for chest pain.     . pantoprazole (PROTONIX) 40 MG tablet Take 40 mg by mouth daily before breakfast.     . simvastatin (ZOCOR) 20 MG tablet Take 20 mg by mouth at bedtime.     . vitamin B-12 (CYANOCOBALAMIN) 1000 MCG tablet Take 1,000 mcg by mouth daily.      Blood pressure (!) 134/49, pulse 61, temperature 97.8 F (36.6 C), temperature source Oral, resp. rate 19, height 6' (1.829 m), weight 104.3 kg (229 lb 15 oz), SpO2 98 %. Physical Exam: Physical Exam  Constitutional: He is oriented to person, place, and time. He appears well-developed and well-nourished. He is cooperative. He appears ill. He appears distressed.  HENT:  Head: Normocephalic and atraumatic.  Right Ear: External ear normal.  Left Ear: External ear normal.  Nose: Nose normal.  Mouth/Throat: Oropharynx is clear and moist and mucous membranes are normal.  Eyes: Conjunctivae, EOM and lids are normal. No scleral icterus.  Pupils equal and round  Neck: Normal range of motion. Neck supple. No tracheal deviation present.  Cardiovascular: Normal rate and regular rhythm.  Pulses:      Radial pulses are 2+ on the right side, and 2+ on the left side.       Dorsalis pedis pulses are 2+ on the right side, and 2+ on the left side.  Pulmonary/Chest: Breath  sounds normal. Tachypnea noted.  Abdominal: Soft. He exhibits distension (mild- moderate). Bowel sounds are decreased. There is generalized tenderness (out of proportion to exam ). There is rebound and guarding. There is no rigidity.  Genitourinary: Penis normal.  Musculoskeletal:  ROM grossly intact in bilateral upper extremities. No gross deformities of bilateral upper and lower extremities.   Neurological: He is alert and oriented to person, place, and time. No sensory deficit.  Skin: Skin is warm. He is diaphoretic.  Psychiatric: His speech is normal and behavior is normal. His mood appears anxious.    Results for orders placed or performed during the hospital encounter of 07/21/18 (from the past 48 hour(s))  Glucose, capillary     Status: Abnormal   Collection Time: 07/21/18  4:20 AM  Result Value Ref Range   Glucose-Capillary 480 (H) 70 - 99 mg/dL   Comment 1 Notify RN    Comment 2 Document in Chart   Type and screen [place]     Status: None   Collection Time: 07/21/18  4:42 AM  Result Value Ref Range   ABO/RH(D) O POS    Antibody Screen NEG    Sample Expiration      07/24/2018 Performed at [facility], [place] [address] [place], [place] [address]   CBC with Differential/Platelet     Status: Abnormal   Collection Time: 07/21/18  4:44 AM  Result Value Ref Range   WBC 24.2 (H) 4.0 - 10.5 K/uL   RBC 3.28 (L) 4.22 - 5.81 MIL/uL   Hemoglobin 10.4 (L) 13.0 - 17.0 g/dL   HCT 31.4 (L) 39.0 - 52.0 %   MCV 95.7 78.0 - 100.0 fL   MCH 31.7 26.0 - 34.0 pg   MCHC 33.1 30.0 - 36.0 g/dL   RDW 14.4 11.5 - 15.5 %   Platelets 364 150 - 400 K/uL   Neutrophils Relative % 93 %   Neutro Abs 22.4 (H) 1.7 - 7.7 K/uL   Lymphocytes Relative 2 %   Lymphs Abs 0.6 (L) 0.7 - 4.0 K/uL   Monocytes Relative 3 %   Monocytes Absolute 0.8 0.1 - 1.0 K/uL   Eosinophils Relative 1 %   Eosinophils Absolute 0.1 0.0 - 0.7 K/uL   Basophils Relative 0 %   Basophils Absolute 0.1 0.0  - 0.1 K/uL   Immature Granulocytes 1 %   Abs Immature Granulocytes 0.2 (H) 0.0 - 0.1 K/uL    Comment: Performed at [facility] [address] [place], [place] [address]  Comprehensive metabolic panel     Status: Abnormal   Collection Time: 07/21/18  4:44 AM  Result Value Ref Range   Sodium 133 (L) 135 - 145 mmol/L   Potassium 3.3 (L) 3.5 - 5.1 mmol/L   Chloride 91 (L) 98 - 111 mmol/L   CO2 24 22 - 32 mmol/L   Glucose, Bld 484 (H) 70 - 99 mg/dL   BUN 121 (H) 8 - 23 mg/dL   Creatinine, Ser 2.67 (H) 0.61 - 1.24 mg/dL   Calcium 9.3 8.9 - 10.3 mg/dL   Total Protein 6.7 6.5 - 8.1 g/dL   Albumin 3.3 (L) 3.5 - 5.0 g/dL   AST 49 (H) 15 - 41 U/L   ALT 33 0 - 44 U/L   Alkaline Phosphatase 239 (H) 38 - 126 U/L   Total Bilirubin 1.0 0.3 - 1.2 mg/dL   GFR calc non Af Amer 22 (L) >60 mL/min   GFR calc Af Amer 26 (L) >60 mL/min    Comment: (NOTE) The eGFR has been calculated using the CKD EPI equation. This calculation has not been validated in all clinical situations. eGFR's persistently <60 mL/min signify possible Chronic Kidney Disease.    Anion gap 18 (H) 5 - 15    Comment: Performed at [facility], [place] [address], [place], [place] [address]  Magnesium     Status: None   Collection Time: 07/21/18  4:44 AM  Result Value Ref Range   Magnesium 2.3 1.7 - 2.4 mg/dL    Comment: Performed at [facility] [address], [place], [place] [address]  Phosphorus     Status: None   Collection Time: 07/21/18  4:44 AM  Result Value Ref Range   Phosphorus 3.6 2.5 - 4.6 mg/dL    Comment: Performed at [facility] [address] [place], [place] [address]  Lactic acid, plasma     Status: Abnormal   Collection Time: 07/21/18  4:44 AM  Result Value Ref Range   Lactic Acid, Venous 4.4 (HH) 0.5 - 1.9 mmol/L    Comment: CRITICAL RESULT CALLED TO, READ BACK BY AND VERIFIED WITH: Colbert Ewing [id number] [facility] Performed at [facility], [place] [address]  [place], [place] [address]   Beta-hydroxybutyric acid     Status: None   Collection Time: 07/21/18  4:44 AM  Result Value Ref Range   Beta-Hydroxybutyric Acid 0.18 0.05 - 0.27 mmol/L    Comment: Performed at [facility] [address], [place], [place] [address]  Lipase, blood     Status: Abnormal   Collection Time: 07/21/18  4:44 AM  Result Value Ref Range   Lipase 60 (H) 11 - 51 U/L    Comment: Performed at [facility], [place] [address], [place], [place] [address]  Troponin I     Status: Abnormal   Collection Time: 07/21/18  4:44 AM  Result Value Ref Range   Troponin I 0.03 (HH) <0.03 ng/mL    Comment: CRITICAL RESULT CALLED TO, READ BACK BY AND VERIFIED WITH: ELIAS,M RN 07/21/2018 0636 JORDANS Performed at [facility], [place] [address], [place], [place] [address]   Urinalysis, Routine w reflex microscopic     Status: Abnormal   Collection Time: 07/21/18  4:49 AM  Result Value Ref Range   Color, Urine YELLOW YELLOW   APPearance CLEAR CLEAR   Specific Gravity, Urine 1.011 1.005 - 1.030   pH 5.0 5.0 - 8.0   Glucose, UA >=500 (A) NEGATIVE mg/dL   Hgb urine dipstick NEGATIVE NEGATIVE   Bilirubin Urine NEGATIVE NEGATIVE   Ketones, ur NEGATIVE NEGATIVE mg/dL   Protein, ur NEGATIVE NEGATIVE mg/dL   Nitrite NEGATIVE NEGATIVE   Leukocytes, UA NEGATIVE NEGATIVE   RBC / HPF 0-5 0 - 5 RBC/hpf   WBC, UA 0-5 0 - 5 WBC/hpf   Bacteria, UA NONE SEEN NONE SEEN   Mucus PRESENT     Comment: Performed at [facility] [address], [place], [place] [address]  Sodium, urine, random     Status: None   Collection Time: 07/21/18  4:49 AM  Result Value Ref Range   Sodium, Ur 45 mmol/L    Comment: Performed at [facility] [address], [place], [place] [address]  Osmolality, urine     Status: None   Collection Time: 07/21/18  4:49 AM  Result Value Ref Range   Osmolality, Ur 446 300 - 900 mOsm/kg    Comment: Performed at [facility] [address]  [place], [place] [address]  MRSA PCR Screening     Status: None   Collection Time: 07/21/18  4:49 AM  Result Value Ref Range   MRSA by PCR NEGATIVE NEGATIVE    Comment:        The GeneXpert MRSA Assay (FDA approved for NASAL specimens only), is one component of a comprehensive MRSA colonization surveillance program. It is not intended to diagnose MRSA infection nor to guide or monitor treatment for MRSA infections. Performed at [facility], [place] [address], [place], [place] [address]   Glucose, capillary     Status: Abnormal  Collection Time: 07/21/18  6:06 AM  Result Value Ref Range   Glucose-Capillary 391 (H) 70 - 99 mg/dL  Lactic acid, plasma     Status: Abnormal   Collection Time: 07/21/18  6:54 AM  Result Value Ref Range   Lactic Acid, Venous 4.4 (HH) 0.5 - 1.9 mmol/L    Comment: CRITICAL RESULT CALLED TO, READ BACK BY AND VERIFIED WITH: TROYCE HOOD,RN AT 0802 07/21/18 BY ZBEECH. Performed at [facility], [place] [address], [place], [place] [address]   Glucose, capillary     Status: Abnormal   Collection Time: 07/21/18  7:22 AM  Result Value Ref Range   Glucose-Capillary 410 (H) 70 - 99 mg/dL  Glucose, capillary     Status: Abnormal   Collection Time: 07/21/18  8:54 AM  Result Value Ref Range   Glucose-Capillary 407 (H) 70 - 99 mg/dL  Glucose, capillary     Status: Abnormal   Collection Time: 07/21/18 10:07 AM  Result Value Ref Range   Glucose-Capillary 326 (H) 70 - 99 mg/dL   Ct Abdomen Pelvis Wo Contrast  Result Date: 07/21/2018 CLINICAL DATA:  Abdominal pain. Vomiting. Elevated white blood cell count and lactic acid EXAM: CT ABDOMEN AND PELVIS WITHOUT CONTRAST TECHNIQUE: Multidetector CT imaging of the abdomen and pelvis was performed following the standard protocol without IV contrast. COMPARISON:  Plain film 7299 FINDINGS: Lower chest:  Lung bases are clear. Hepatobiliary: Liver is mildly nodular. Postcholecystectomy. No biliary duct dilatation. Pancreas:  Normal pancreatic parenchymal intensity. No ductal dilatation or inflammation. Spleen: Normal spleen. Adrenals/urinary tract: Adrenal glands and kidneys are normal. Normal bladder. Stomach/Bowel: Stomach is normal. Duodenum is normal. In the mid small bowel there is stasis with fecalization ofinternal contents. There is pneumatosis within the wall of the small bowel centrally in the upper abdomen (image 36/3). Additionally, there is portal venous gas veins of the mesentery of the upper small bowel as seen in LEFT upper quadrant (image 44/3). Findings are consistent with small bowel ischemia. No obstructing lesion identified. Mild caliber change from proximal to distal small bowel. Vascular/Lymphatic: Venous mesenteric gas. No gas within the portal veins. Heavy calcification of the abdominal aorta. Calcifications at the ostia of the SMA and celiac trunk. Musculoskeletal: No aggressive osseous lesion Reproductive: Prostate normal. Small amount free fluid the pelvis. IMPRESSION: 1. Small bowel ischemia with pneumatosis within loops of bowel in the LEFT upper quadrant and gas within mesenteric veins. 2. No portal venous gas in the liver. 3. No evidence of high-grade bowel obstruction. There is some fecalization of enteric contents of the proximal small bowel. 4. Aortic Atherosclerosis (ICD10-I70.0). Calcifications about the ostia of the SMA and celiac trunk, patency cannot be evaluated on noncontrast exam. Critical Value/emergent results were called by telephone at the time of interpretation on 07/21/2018 at 10:13 am to Dr. [place] Cellar , who verbally acknowledged these results. Electronically Signed   By: Suzy Bouchard M.D.   On: 07/21/2018 10:17   Dg Abd Portable 1v  Result Date: 07/21/2018 CLINICAL DATA:  Abdominal pain vomiting tonight. EXAM: PORTABLE ABDOMEN - 1 VIEW COMPARISON:  CT abdomen and pelvis December 01, 2012 FINDINGS: Moderate amount of retained large bowel stool. Moderate amount of retained  large bowel stool. A few loops of gas distended small large bowel. Surgical clips in the included right abdomen compatible with cholecystectomy. Phleboliths in the pelvis. Mild vascular calcifications. Mild dextroscoliosis. Ankylosis of the sacroiliac joints. Osteopenia. IMPRESSION: Moderate amount of retained large bowel stool with mild probable  ileus, less likely early small bowel obstruction. Electronically Signed   By: Elon Alas M.D.   On: 07/21/2018 06:14      Assessment/Plan HTN HLD T2DM COPD Hx of CAD S/p pacemaker placement A.Fib on eliquis - last dose yesterday, reversal agent given, s/p 1 unit PRBC Chronic Diastolic CHF/Chronic ischemic heart disease OSA on CPAP Hx of colonic polyps and moderate sigmoid diverticulosis GERD Non-toxic multinodular goiter Mild renal insufficiency Obesity  Small bowel ischemia with pneumatosis - CT: 1. Small bowel ischemia with pneumatosis within loops of bowel in the LEFT upper quadrant and gas within mesenteric veins. 2. No portal venous gas in the liver. 3. No evidence of high-grade bowel obstruction. There is some fecalization of enteric contents of the proximal small bowel. 4. Aortic Atherosclerosis (ICD10-I70.0). Calcifications about the ostia of the SMA and celiac trunk, patency cannot be evaluated on noncontrast exam. - lactic 4.4 - WBC 24.2   TO OR EMERGENTLY FOR EX-LAP WITH PROBABLE SMALL BOWEL RESECTION   Brigid Re, [facility] 07/21/2018, 10:32 AM Pager: [phone] Consults: [phone] Mon-Fri 7:00 am-4:30 pm Sat-Sun 7:00 am-11:30 am

## 2018-07-21 NOTE — Care Management Note (Signed)
Case Management Note  Patient Details  Name: Lawrence [name] MRN: [id number] Date of Birth: December 13, 1946  Subjective/Objective:   From home, presents with GIB with abd pain,  CT shows SB ischemia, Surgery consulted, they rec explor lap, to OR emergently for ex lap with probable small bowel resection.                 Action/Plan: NCM will follow for transition of care needs.   Expected Discharge Date:                  Expected Discharge Plan:     In-House Referral:     Discharge planning Services  CM Consult  Post Acute Care Choice:    Choice offered to:     DME Arranged:    DME Agency:     HH Arranged:    HH Agency:     Status of Service:  In process, will continue to follow  If discussed at Long Length of Stay Meetings, dates discussed:    Additional Comments:  Zenon Mayo, RN 07/21/2018, 12:39 PM

## 2018-07-21 NOTE — Progress Notes (Addendum)
PULMONARY / CRITICAL CARE MEDICINE   Name: Lawrence [name] MRN: [id number] DOB: 09/26/1946    ADMISSION DATE:  07/21/2018 CONSULTATION DATE:  07/21/2018  REFERRING MD:  [facility]   CHIEF COMPLAINT:  Anemia   HISTORY OF PRESENT ILLNESS:   72 year old male with PMH of COPD, DM, HLD, HTN, CAD, S/P Pacemaker, A.Fib on Eliquis, Diastolic HF, OSA on CPAP at HS, H/O AVM Colonic Polys and Moderate Sigmoid Diverticulosis, GERD  Presents to [facility] ED on 7/29 with Abdominal Pain, Nausea, and reportedly two days of Hematochezia in which appeared black/tarry. Hemoglobin 8.8. Given 1 unit RBC at OSH. Eliquis reversed with Anti-Inhibitor Coagulant Complex. Patient transferred to [facility] for Further Evaluation.   On arrival to ICU patient is alert and oriented with systolic 409. On insulin gtt. Complaining of abdominal tenderness and nausea.    SUBJECTIVE:  Pt reports ongoing abdominal pain.  States it feels like every time he moves a knife is going through his abdomen.  CT results pending.  VITAL SIGNS: BP (!) 141/49   Pulse 64   Temp 97.8 F (36.6 C) (Oral)   Resp (!) 21   Ht 6' (1.829 m)   Wt 229 lb 15 oz (104.3 kg)   SpO2 100%   BMI 31.19 kg/m   HEMODYNAMICS:    VENTILATOR SETTINGS:    INTAKE / OUTPUT: I/O last 3 completed shifts: In: 29.9 [I.V.:5.9; IV Piggyback:24] Out: 200 [Urine:200]  PHYSICAL EXAMINATION: General: Uncomfortable appearing elderly male lying in bed, moaning HEENT: MM pink/dry, no JVD Neuro: AAO x4, MAE CV: s1s2 rrr, no m/r/g PULM: even/non-labored, lungs bilaterally clear GI: Protuberant, decreased bowel sounds, pain with palpation Extremities: warm/dry, no edema  Skin: no rashes or lesions  LABS:  BMET Recent Labs  Lab 07/21/18 0444  NA 133*  K 3.3*  CL 91*  CO2 24  BUN 121*  CREATININE 2.67*  GLUCOSE 484*    Electrolytes Recent Labs  Lab 07/21/18 0444  CALCIUM 9.3  MG 2.3  PHOS 3.6    CBC Recent Labs  Lab 07/21/18 0444   WBC 24.2*  HGB 10.4*  HCT 31.4*  PLT 364    Coag's No results for input(s): APTT, INR in the last 168 hours.  Sepsis Markers Recent Labs  Lab 07/21/18 0444 07/21/18 0654  LATICACIDVEN 4.4* 4.4*    ABG No results for input(s): PHART, PCO2ART, PO2ART in the last 168 hours.  Liver Enzymes Recent Labs  Lab 07/21/18 0444  AST 49*  ALT 33  ALKPHOS 239*  BILITOT 1.0  ALBUMIN 3.3*    Cardiac Enzymes Recent Labs  Lab 07/21/18 0444  TROPONINI 0.03*    Glucose Recent Labs  Lab 07/21/18 0420 07/21/18 0606 07/21/18 0722 07/21/18 0854  GLUCAP 480* 391* 410* 407*    Imaging Dg Abd Portable 1v  Result Date: 07/21/2018 CLINICAL DATA:  Abdominal pain vomiting tonight. EXAM: PORTABLE ABDOMEN - 1 VIEW COMPARISON:  CT abdomen and pelvis December 01, 2012 FINDINGS: Moderate amount of retained large bowel stool. Moderate amount of retained large bowel stool. A few loops of gas distended small large bowel. Surgical clips in the included right abdomen compatible with cholecystectomy. Phleboliths in the pelvis. Mild vascular calcifications. Mild dextroscoliosis. Ankylosis of the sacroiliac joints. Osteopenia. IMPRESSION: Moderate amount of retained large bowel stool with mild probable ileus, less likely early small bowel obstruction. Electronically Signed   By: Elon Alas M.D.   On: 07/21/2018 06:14     STUDIES:  CT Abd  7/29 >> small bowel ischemia with pneumatosis within loops of bowel in the left upper quadrant and gas of the mesenteric veins, no portal venous gas in the liver, no evidence of high-grade bowel obstruction, aortic atherosclerosis  CULTURES: Blood 7/29 >> U/A 7/29 >>  ANTIBIOTICS:   SIGNIFICANT EVENTS: 7/29 Admit   LINES/TUBES: PIV   DISCUSSION: 72 year old male with PMH of AVM and diverticulosis on Eliquis admitted with with dark tarry stools, nausea and ABD pain.  Work up revealed lactic acidosis, CT with small bowel ischemia, pneumatosis.  To  OR 7/29.     ASSESSMENT / PLAN:  OSA, COPD P:   Anticipate he might return from the OR on vent We will follow-up postoperatively to assess pulmonary needs DuoNeb as needed CPAP nightly does not remain intubated post surgery 3/01  Chronic Diastolic HF S/P Pacemaker  H/O A.Fib on Eliquis, HTN, CAD, HLD P:  ICU monitoring Hold anticoagulation   Acute on CKD stage 3 ([place] Crt 1.8-1.9)  P:   Trend BMP / urinary output Repeat BMP at 1800 Replace electrolytes as indicated Avoid nephrotoxic agents, ensure adequate renal perfusion  Small bowel ischemia / pneumatosis with associated abdominal pain hematochezia - Dark Tarry  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 C[facility] Endoscopy) Colonic Polys and Moderate Sigmoid Diverticulosis  GERD Obesity  -Followed by [facility], Planned Colonoscopy September  P:   Appreciate GI input CCS consulted Plan for OR exploration NPO Twice daily PPI Zofran as needed  Acute Blood Loss Anemia  Iron Deficiency Anemia  Chronic anticoagulation -reversed on admission P:  Hold further anti-coagulation SCDs Trend CBC, repeat CBC at 1800  Leukocytosis  P:   Trend WBC, fever curve Follow cultures Peri-op antibiotics per CCS    Hyperglycemia  DM   P:   Insulin drip per hyperglycemia protocol  Chronic Back Pain  P:  PRN IV Tylenol Lidoderm patch  FAMILY  - Updates: Patient updated at bedside 7/29 on NP rounds.  - Inter-disciplinary family meet or Palliative Care meeting due by: 07/28/2018      Noe Gens, NP-C [facility] Pgr: [phone] or if no answer [phone] 07/21/2018, 9:30 AM

## 2018-07-22 ENCOUNTER — Inpatient Hospital Stay (HOSPITAL_COMMUNITY): Payer: Medicare Other

## 2018-07-22 ENCOUNTER — Telehealth: Payer: Self-pay | Admitting: Gastroenterology

## 2018-07-22 ENCOUNTER — Encounter (HOSPITAL_COMMUNITY): Payer: Self-pay | Admitting: Surgery

## 2018-07-22 DIAGNOSIS — K559 Vascular disorder of intestine, unspecified: Secondary | ICD-10-CM

## 2018-07-22 LAB — CBC
HCT: 24.9 % — ABNORMAL LOW (ref 39.0–52.0)
Hemoglobin: 8 g/dL — ABNORMAL LOW (ref 13.0–17.0)
MCH: 31.6 pg (ref 26.0–34.0)
MCHC: 32.1 g/dL (ref 30.0–36.0)
MCV: 98.4 fL (ref 78.0–100.0)
PLATELETS: 306 10*3/uL (ref 150–400)
RBC: 2.53 MIL/uL — AB (ref 4.22–5.81)
RDW: 15.7 % — AB (ref 11.5–15.5)
WBC: 21.3 10*3/uL — AB (ref 4.0–10.5)

## 2018-07-22 LAB — GLUCOSE, CAPILLARY
GLUCOSE-CAPILLARY: 165 mg/dL — AB (ref 70–99)
GLUCOSE-CAPILLARY: 250 mg/dL — AB (ref 70–99)
Glucose-Capillary: 127 mg/dL — ABNORMAL HIGH (ref 70–99)
Glucose-Capillary: 132 mg/dL — ABNORMAL HIGH (ref 70–99)
Glucose-Capillary: 138 mg/dL — ABNORMAL HIGH (ref 70–99)
Glucose-Capillary: 153 mg/dL — ABNORMAL HIGH (ref 70–99)
Glucose-Capillary: 172 mg/dL — ABNORMAL HIGH (ref 70–99)
Glucose-Capillary: 208 mg/dL — ABNORMAL HIGH (ref 70–99)
Glucose-Capillary: 215 mg/dL — ABNORMAL HIGH (ref 70–99)
Glucose-Capillary: 222 mg/dL — ABNORMAL HIGH (ref 70–99)
Glucose-Capillary: 225 mg/dL — ABNORMAL HIGH (ref 70–99)
Glucose-Capillary: 265 mg/dL — ABNORMAL HIGH (ref 70–99)

## 2018-07-22 LAB — POCT I-STAT 7, (LYTES, BLD GAS, ICA,H+H)
Acid-base deficit: 5 mmol/L — ABNORMAL HIGH (ref 0.0–2.0)
Bicarbonate: 20.7 mmol/L (ref 20.0–28.0)
CALCIUM ION: 1.05 mmol/L — AB (ref 1.15–1.40)
HEMATOCRIT: 26 % — AB (ref 39.0–52.0)
HEMOGLOBIN: 8.8 g/dL — AB (ref 13.0–17.0)
O2 Saturation: 100 %
PH ART: 7.308 — AB (ref 7.350–7.450)
Patient temperature: 37.6
Potassium: 3.4 mmol/L — ABNORMAL LOW (ref 3.5–5.1)
Sodium: 139 mmol/L (ref 135–145)
TCO2: 22 mmol/L (ref 22–32)
pCO2 arterial: 41.6 mmHg (ref 32.0–48.0)
pO2, Arterial: 316 mmHg — ABNORMAL HIGH (ref 83.0–108.0)

## 2018-07-22 LAB — BASIC METABOLIC PANEL
ANION GAP: 12 (ref 5–15)
BUN: 115 mg/dL — ABNORMAL HIGH (ref 8–23)
CALCIUM: 8.5 mg/dL — AB (ref 8.9–10.3)
CO2: 22 mmol/L (ref 22–32)
Chloride: 103 mmol/L (ref 98–111)
Creatinine, Ser: 2.82 mg/dL — ABNORMAL HIGH (ref 0.61–1.24)
GFR, EST AFRICAN AMERICAN: 24 mL/min — AB (ref >60)
GFR, EST NON AFRICAN AMERICAN: 21 mL/min — AB (ref >60)
Glucose, Bld: 171 mg/dL — ABNORMAL HIGH (ref 70–99)
Potassium: 4.1 mmol/L (ref 3.5–5.1)
SODIUM: 137 mmol/L (ref 135–145)

## 2018-07-22 LAB — POCT I-STAT 4, (NA,K, GLUC, HGB,HCT)
GLUCOSE: 213 mg/dL — AB (ref 70–99)
HEMATOCRIT: 27 % — AB (ref 39.0–52.0)
HEMOGLOBIN: 9.2 g/dL — AB (ref 13.0–17.0)
POTASSIUM: 3.7 mmol/L (ref 3.5–5.1)
Sodium: 135 mmol/L (ref 135–145)

## 2018-07-22 MED ORDER — INSULIN DETEMIR 100 UNIT/ML ~~LOC~~ SOLN
12.0000 [IU] | Freq: Two times a day (BID) | SUBCUTANEOUS | Status: DC
  Administered 2018-07-22 – 2018-07-26 (×10): 12 [IU] via SUBCUTANEOUS
  Filled 2018-07-22 (×10): qty 0.12

## 2018-07-22 MED ORDER — SODIUM CHLORIDE 0.9% FLUSH
10.0000 mL | Freq: Two times a day (BID) | INTRAVENOUS | Status: DC
  Administered 2018-07-22: 20 mL
  Administered 2018-07-22 – 2018-07-29 (×6): 10 mL

## 2018-07-22 MED ORDER — SILVER NITRATE-POT NITRATE 75-25 % EX MISC
1.0000 "application " | Freq: Once | CUTANEOUS | Status: AC
  Administered 2018-07-22: 1 via TOPICAL
  Filled 2018-07-22: qty 1

## 2018-07-22 MED ORDER — SODIUM CHLORIDE 0.9% FLUSH: 10.0000 mL | INTRAVENOUS | Status: DC | PRN

## 2018-07-22 MED ORDER — INSULIN ASPART 100 UNIT/ML ~~LOC~~ SOLN
0.0000 [IU] | SUBCUTANEOUS | Status: DC
Start: 2018-07-23 — End: 2018-07-31
  Administered 2018-07-22: 7 [IU] via SUBCUTANEOUS
  Administered 2018-07-23: 3 [IU] via SUBCUTANEOUS
  Administered 2018-07-23: 4 [IU] via SUBCUTANEOUS
  Administered 2018-07-24 (×3): 3 [IU] via SUBCUTANEOUS
  Administered 2018-07-27: 4 [IU] via SUBCUTANEOUS
  Administered 2018-07-28 – 2018-07-31 (×8): 3 [IU] via SUBCUTANEOUS
  Administered 2018-07-31: 4 [IU] via SUBCUTANEOUS

## 2018-07-22 MED ORDER — ACETAMINOPHEN 10 MG/ML IV SOLN
1000.0000 mg | Freq: Four times a day (QID) | INTRAVENOUS | Status: AC
  Administered 2018-07-22 – 2018-07-23 (×4): 1000 mg via INTRAVENOUS
  Filled 2018-07-22 (×4): qty 100

## 2018-07-22 MED ORDER — PANTOPRAZOLE SODIUM 40 MG IV SOLR
40.0000 mg | Freq: Every day | INTRAVENOUS | Status: DC
  Administered 2018-07-23 – 2018-07-24 (×2): 40 mg via INTRAVENOUS
  Filled 2018-07-22 (×2): qty 40

## 2018-07-22 MED ORDER — CHLORHEXIDINE GLUCONATE CLOTH 2 % EX PADS
6.0000 | MEDICATED_PAD | Freq: Every day | CUTANEOUS | Status: DC
  Administered 2018-07-22 – 2018-07-31 (×9): 6 via TOPICAL

## 2018-07-22 MED ORDER — INSULIN ASPART 100 UNIT/ML ~~LOC~~ SOLN
1.0000 [IU] | SUBCUTANEOUS | Status: DC
  Administered 2018-07-22 (×2): 3 [IU] via SUBCUTANEOUS
  Administered 2018-07-22: 2 [IU] via SUBCUTANEOUS
  Administered 2018-07-22 (×2): 3 [IU] via SUBCUTANEOUS

## 2018-07-22 MED ORDER — HYDROMORPHONE HCL 1 MG/ML IJ SOLN
1.0000 mg | INTRAMUSCULAR | Status: DC | PRN
  Administered 2018-07-22 – 2018-07-26 (×11): 1 mg via INTRAVENOUS
  Filled 2018-07-22 (×12): qty 1

## 2018-07-22 NOTE — Progress Notes (Signed)
97ml's Dilaudid WIS, witnessed by Georges Mouse RN.

## 2018-07-22 NOTE — Telephone Encounter (Signed)
I did cancel his colonoscopy and set a reminder to see about rescheduling.

## 2018-07-22 NOTE — Progress Notes (Addendum)
Daily Rounding Note  07/22/2018, 12:25 PM  LOS: 1 day     ATTENDING ATTESTATION: Patient doing much better post-op following small bowel and right colon resection for ischemic bowel noted on CT scan. Gross specimen concerning for embolic disease, path report pending. Given his kidney function he is not currently a good candidate for CTA to assess vasculature. This unfortunately occurred while on Eliquis. I recommend resumption of another anticoagulant as soon as possible when okay from surgical standpoint given this occurance. He has a chronic anemia however no active GI bleeding appreciated at this point. His recent bleeding symptoms in the setting of his pain were likely due to ischemic bowel. Otherwise would recommend an echocardiogram in light of this occurrence. Monitor while on anticoagulation but would hold off on any endoscopic evaluation at this time while he recovers unless he has bleeding symptoms.   Will follow peripherally for now, please call with any questions moving forward.   [place] Cellar, MD [facility]           SUBJECTIVE:   Chief complaint: post surgical abd pain is way better than preop pain.  No N/V or BM's since 7/28.   OBJECTIVE:         Vital signs in last 24 hours:    Temp:  [97.5 F (36.4 C)-98.7 F (37.1 C)] 98 F (36.7 C) (07/30 0740) Pulse Rate:  [63-102] 72 (07/30 1030) Resp:  [8-26] 15 (07/30 1030) BP: (91-139)/(38-57) 125/41 (07/30 0800) SpO2:  [89 %-100 %] 89 % (07/30 1030) Arterial Line BP: (81-174)/(28-53) 144/42 (07/30 1030) Weight:  [232 lb 12.9 oz (105.6 kg)] 232 lb 12.9 oz (105.6 kg) (07/30 0552) Last BM Date: 07/20/18 Filed Weights   07/21/18 0410 07/22/18 0552  Weight: 229 lb 15 oz (104.3 kg) 232 lb 12.9 oz (105.6 kg)   General: alert, tired, non-toxic.  Comfortable.     Heart: RRR Chest: clear bil.   Abdomen: soft, tender, BS present.     Extremities: no CCE Neuro/Psych:  A and O x 3.  Moves all 4s.    Intake/Output from previous day: 07/29 0701 - 07/30 0700 In: 3725.9 [I.V.:3235.8; NG/GT:90; IV Piggyback:400.1] Out: 980 [Urine:780; Emesis/NG output:150; Blood:50]  Intake/Output this shift: Total I/O In: 253.7 [I.V.:253.7] Out: -   Lab Results: Recent Labs    07/21/18 1044 07/21/18 1837 07/22/18 0418  WBC 50.2* 31.7* 21.3*  HGB 11.0* 8.7* 8.0*  HCT 31.6* 26.9* 24.9*  PLT 458* 377 306   BMET Recent Labs    07/21/18 0444 07/21/18 1837 07/22/18 0418  NA 133* 135 137  K 3.3* 4.0 4.1  CL 91* 102 103  CO2 24 22 22   GLUCOSE 484* 244* 171*  BUN 121* 112* 115*  CREATININE 2.67* 2.78* 2.82*  CALCIUM 9.3 8.1* 8.5*   LFT Recent Labs    07/21/18 0444  PROT 6.7  ALBUMIN 3.3*  AST 49*  ALT 33  ALKPHOS 239*  BILITOT 1.0   PT/INR No results for input(s): LABPROT, INR in the last 72 hours. Hepatitis Panel No results for input(s): HEPBSAG, HCVAB, HEPAIGM, HEPBIGM in the last 72 hours.  Studies/Results: Ct Abdomen Pelvis Wo Contrast  Result Date: 07/21/2018 CLINICAL DATA:  Abdominal pain. Vomiting. Elevated white blood cell count and lactic acid EXAM: CT ABDOMEN AND PELVIS WITHOUT CONTRAST TECHNIQUE: Multidetector CT imaging of the abdomen and pelvis was performed following the standard protocol without IV contrast. COMPARISON:  Plain film 7299 FINDINGS: Lower  chest:  Lung bases are clear. Hepatobiliary: Liver is mildly nodular. Postcholecystectomy. No biliary duct dilatation. Pancreas: Normal pancreatic parenchymal intensity. No ductal dilatation or inflammation. Spleen: Normal spleen. Adrenals/urinary tract: Adrenal glands and kidneys are normal. Normal bladder. Stomach/Bowel: Stomach is normal. Duodenum is normal. In the mid small bowel there is stasis with fecalization ofinternal contents. There is pneumatosis within the wall of the small bowel centrally in the upper abdomen (image 36/3). Additionally,  there is portal venous gas veins of the mesentery of the upper small bowel as seen in LEFT upper quadrant (image 44/3). Findings are consistent with small bowel ischemia. No obstructing lesion identified. Mild caliber change from proximal to distal small bowel. Vascular/Lymphatic: Venous mesenteric gas. No gas within the portal veins. Heavy calcification of the abdominal aorta. Calcifications at the ostia of the SMA and celiac trunk. Musculoskeletal: No aggressive osseous lesion Reproductive: Prostate normal. Small amount free fluid the pelvis. IMPRESSION: 1. Small bowel ischemia with pneumatosis within loops of bowel in the LEFT upper quadrant and gas within mesenteric veins. 2. No portal venous gas in the liver. 3. No evidence of high-grade bowel obstruction. There is some fecalization of enteric contents of the proximal small bowel. 4. Aortic Atherosclerosis (ICD10-I70.0). Calcifications about the ostia of the SMA and celiac trunk, patency cannot be evaluated on noncontrast exam. Critical Value/emergent results were called by telephone at the time of interpretation on 07/21/2018 at 10:13 am to Dr. [place] Cellar , who verbally acknowledged these results. Electronically Signed   By: Suzy Bouchard M.D.   On: 07/21/2018 10:17   Dg Chest Port 1 View  Result Date: 07/22/2018 CLINICAL DATA:  Small bowel ischemia, shortness of breath, coronary artery disease, chronic ischemic heart disease, essential benign hypertension, type II diabetes mellitus, former smoker EXAM: PORTABLE CHEST 1 VIEW COMPARISON:  Portable exam 0502 hours compared to 07/21/2018 FINDINGS: Nasogastric tube extends into abdomen. RIGHT jugular central venous catheter tip projects over SVC near cavoatrial junction. LEFT subclavian sequential pacemaker leads project over RIGHT atrium and RIGHT ventricle, unchanged. Upper normal size of cardiac silhouette post CABG. Mediastinal contours and pulmonary vascularity normal. Improved interstitial  infiltrates question edema. No pleural effusion or pneumothorax. Bones demineralized. IMPRESSION: Probable mild pulmonary edema, slightly improved. Electronically Signed   By: Lavonia Dana M.D.   On: 07/22/2018 09:04   Dg Chest Port 1 View  Result Date: 07/21/2018 CLINICAL DATA:  Central line placement EXAM: PORTABLE CHEST 1 VIEW COMPARISON:  07/21/2018 at 12:26 a.m. FINDINGS: Right IJ central venous catheter tip is at the cavoatrial junction. Left chest wall pacemaker is unchanged. Sequelae of median sternotomy and CABG. Nasogastric tube side port is at the gastroesophageal junction. Mild pulmonary edema. IMPRESSION: 1. Right IJ CVC tip at the cavoatrial junction.  No pneumothorax. 2. Nasogastric tube side port at the gastroesophageal junction. Recommend advancing by 5-7 cm. 3. Mild pulmonary edema. Electronically Signed   By: Ulyses Jarred M.D.   On: 07/21/2018 15:45   Dg Abd Portable 1v  Result Date: 07/21/2018 CLINICAL DATA:  Abdominal pain vomiting tonight. EXAM: PORTABLE ABDOMEN - 1 VIEW COMPARISON:  CT abdomen and pelvis December 01, 2012 FINDINGS: Moderate amount of retained large bowel stool. Moderate amount of retained large bowel stool. A few loops of gas distended small large bowel. Surgical clips in the included right abdomen compatible with cholecystectomy. Phleboliths in the pelvis. Mild vascular calcifications. Mild dextroscoliosis. Ankylosis of the sacroiliac joints. Osteopenia. IMPRESSION: Moderate amount of retained large bowel stool with mild probable ileus,  less likely early small bowel obstruction. Electronically Signed   By: Elon Alas M.D.   On: 07/21/2018 06:14   Scheduled Meds: . chlorhexidine  15 mL Mouth Rinse BID  . Chlorhexidine Gluconate Cloth  6 each Topical Daily  . insulin aspart  1-3 Units Subcutaneous Q4H  . insulin detemir  12 Units Subcutaneous Q12H  . lidocaine  1 patch Transdermal Q24H  . mouth rinse  15 mL Mouth Rinse q12n4p  . pantoprazole  40 mg  Intravenous Q12H  . sodium chloride flush  10-40 mL Intracatheter Q12H   Continuous Infusions: . sodium chloride    . sodium chloride 75 mL/hr at 07/22/18 1000  . sodium chloride 10 mL/hr at 07/22/18 1000  . acetaminophen 1,000 mg (07/22/18 1230)  . cefoTEtan (CEFOTAN) 2 GM IVPB (Mini-Bag Plus) Stopped (07/22/18 0014)   PRN Meds:.sodium chloride, HYDROmorphone (DILAUDID) injection, ipratropium-albuterol, ondansetron (ZOFRAN) IV, sodium chloride flush  ASSESMENT:   *  Ischemic distal SB S/p laparotomy and resection 40 cm distal ileum and cecectomy with primary anatomosis.  "patchy ischemia c/w embolic disease" noted.    *   Chronic Eliquis for A fib, on hold.  Given surgical findings, pt looks to be an Eliquis fail.  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  FOBT +, SB AVMs.    Note dropping Hgb.  Has EGD/Colonoscopy set for 9/3, will d/w MD re postponing this.   PLAN   *   Drop PPI to 1 x daily.      Azucena Freed  07/22/2018, 12:25 PM Phone [phone]

## 2018-07-22 NOTE — Progress Notes (Signed)
PULMONARY / CRITICAL CARE MEDICINE   Name: Lawrence [name] MRN: [id number] DOB: 09/20/1946    ADMISSION DATE:  07/21/2018 CONSULTATION DATE:  07/21/2018  REFERRING MD:  [facility]   CHIEF COMPLAINT:  Anemia   HISTORY OF PRESENT ILLNESS:   72 year old male with PMH of COPD, DM, HLD, HTN, CAD, S/P Pacemaker, A.Fib on Eliquis, Diastolic HF, OSA on CPAP at HS, H/O AVM Colonic Polys and Moderate Sigmoid Diverticulosis, GERD  Presents to [facility] ED on 7/29 with Abdominal Pain, Nausea, and reportedly two days of Hematochezia in which appeared black/tarry. Hemoglobin 8.8. Given 1 unit RBC at OSH. Eliquis reversed with Anti-Inhibitor Coagulant Complex. Patient transferred to [facility] for Further Evaluation.   On arrival to ICU patient is alert and oriented with systolic 163. On insulin gtt. Complaining of abdominal tenderness and nausea.    SUBJECTIVE:  States he is doing well. Fell better. Wants to get OOB today . Using Incentives   VITAL SIGNS: BP (!) 125/41 (BP Location: Right Arm)   Pulse 68   Temp 98 F (36.7 C) (Oral)   Resp 20   Ht 6' (1.829 m)   Wt 232 lb 12.9 oz (105.6 kg)   SpO2 98%   BMI 31.57 kg/m   HEMODYNAMICS:    VENTILATOR SETTINGS:    INTAKE / OUTPUT: I/O last 3 completed shifts: In: 3832.3 [I.V.:3243.2; NG/GT:90; IV Piggyback:499.1] Out: 1180 [Urine:980; Emesis/NG output:150; Blood:50]  PHYSICAL EXAMINATION: General: Awake and alert, pleasant, in NAD HEENT: MM pink/dry, no JVD, NCAT Neuro: A&O x4, MAE x 4, appropriate CV: s1s2 rrr, no m/r/g PULM: bilateral excursion, even/non-labored, lungs bilaterally clear GI: Protuberant, dressing CDI, BS + Extremities: warm/dry, no edema  Skin: no rashes or lesions  LABS:  BMET Recent Labs  Lab 07/21/18 0444 07/21/18 1837 07/22/18 0418  NA 133* 135 137  K 3.3* 4.0 4.1  CL 91* 102 103  CO2 24 22 22   BUN 121* 112* 115*  CREATININE 2.67* 2.78* 2.82*  GLUCOSE 484* 244* 171*    Electrolytes Recent Labs   Lab 07/21/18 0444 07/21/18 1837 07/22/18 0418  CALCIUM 9.3 8.1* 8.5*  MG 2.3  --   --   PHOS 3.6  --   --     CBC Recent Labs  Lab 07/21/18 1044 07/21/18 1837 07/22/18 0418  WBC 50.2* 31.7* 21.3*  HGB 11.0* 8.7* 8.0*  HCT 31.6* 26.9* 24.9*  PLT 458* 377 306    Coag's No results for input(s): APTT, INR in the last 168 hours.  Sepsis Markers Recent Labs  Lab 07/21/18 0444 07/21/18 0654  LATICACIDVEN 4.4* 4.4*    ABG No results for input(s): PHART, PCO2ART, PO2ART in the last 168 hours.  Liver Enzymes Recent Labs  Lab 07/21/18 0444  AST 49*  ALT 33  ALKPHOS 239*  BILITOT 1.0  ALBUMIN 3.3*    Cardiac Enzymes Recent Labs  Lab 07/21/18 0444 07/21/18 1044  TROPONINI 0.03* 0.03*    Glucose Recent Labs  Lab 07/22/18 0002 07/22/18 0059 07/22/18 0213 07/22/18 0308 07/22/18 0416 07/22/18 0736  GLUCAP 132* 138* 153* 172* 165* 222*    Imaging Dg Chest Port 1 View  Result Date: 07/22/2018 CLINICAL DATA:  Small bowel ischemia, shortness of breath, coronary artery disease, chronic ischemic heart disease, essential benign hypertension, type II diabetes mellitus, former smoker EXAM: PORTABLE CHEST 1 VIEW COMPARISON:  Portable exam 0502 hours compared to 07/21/2018 FINDINGS: Nasogastric tube extends into abdomen. RIGHT jugular central venous catheter tip projects over  SVC near cavoatrial junction. LEFT subclavian sequential pacemaker leads project over RIGHT atrium and RIGHT ventricle, unchanged. Upper normal size of cardiac silhouette post CABG. Mediastinal contours and pulmonary vascularity normal. Improved interstitial infiltrates question edema. No pleural effusion or pneumothorax. Bones demineralized. IMPRESSION: Probable mild pulmonary edema, slightly improved. Electronically Signed   By: Lavonia Dana M.D.   On: 07/22/2018 09:04   Dg Chest Port 1 View  Result Date: 07/21/2018 CLINICAL DATA:  Central line placement EXAM: PORTABLE CHEST 1 VIEW COMPARISON:   07/21/2018 at 12:26 a.m. FINDINGS: Right IJ central venous catheter tip is at the cavoatrial junction. Left chest wall pacemaker is unchanged. Sequelae of median sternotomy and CABG. Nasogastric tube side port is at the gastroesophageal junction. Mild pulmonary edema. IMPRESSION: 1. Right IJ CVC tip at the cavoatrial junction.  No pneumothorax. 2. Nasogastric tube side port at the gastroesophageal junction. Recommend advancing by 5-7 cm. 3. Mild pulmonary edema. Electronically Signed   By: Ulyses Jarred M.D.   On: 07/21/2018 15:45     STUDIES:  CT Abd 7/29 >> small bowel ischemia with pneumatosis within loops of bowel in the left upper quadrant and gas of the mesenteric veins, no portal venous gas in the liver, no evidence of high-grade bowel obstruction, aortic atherosclerosis  CULTURES: Blood 7/29 >> U/A 7/29 >>  ANTIBIOTICS: Cefotan 7/29>>  SIGNIFICANT EVENTS: 7/29 Admit  7/29>> S/p exploratory laparotomy with small bowel resection and cecectomy 07/21/18 Dr. Brantley Stage   LINES/TUBES: PIV   DISCUSSION: 72 year old male with PMH of AVM and diverticulosis on Eliquis admitted with with dark tarry stools, nausea and ABD pain.  Work up revealed lactic acidosis, CT with small bowel ischemia, pneumatosis.  To OR 7/29.     ASSESSMENT / PLAN:  OSA, COPD P:   Extubated post op We will follow-up postoperatively to assess pulmonary needs DuoNeb as needed CPAP nightly once abdomen closed IS Mobilize daily  Chronic Diastolic HF S/P Pacemaker  H/O A.Fib on Eliquis, HTN, CAD, HLD P:  ICU monitoring Hold anticoagulation for now   Acute on CKD stage 3 ([place]line Crt 1.8-1.9)  Creatinine bump post op P:   Trend BMP / urinary output Replace electrolytes as indicated Avoid nephrotoxic agents, ensure adequate renal perfusion Consider renal consult if creatinine continues to rise  Small bowel ischemia / pneumatosis with associated abdominal pain hematochezia - Dark Tarry  Small Bowel AVM  (12/05/2012 [facility]) Colonic Polys and Moderate Sigmoid Diverticulosis  GERD Obesity  -Followed by [facility], Planned Colonoscopy September  S/p exploratory laparotomy with small bowel resection and cecectomy 07/21/18 Dr. Brantley Stage Some wound oozing 07/21/2018 am>> silver nitrate  P:   Day 1 post op Appreciate CCS assistance  NPO for now, hope to feed tomorrow Twice daily PPI Zofran as needed  Acute Blood Loss Anemia  Iron Deficiency Anemia  Chronic anticoagulation -reversed on admission Oozing from wound 7/30 am>> controlled with silver nitrate per CCS P:  Hold further anti-coagulation for now SCDs Trend CBC Transfuse for HGB < 7 Monitor for obvious bleeding For Wound Vac 7/31  Leukocytosis  Down trending since surgery P:   Trend WBC, fever curve Follow cultures Peri-op antibiotics per CCS ABX as above    Hyperglycemia  DM   P:   CBG's Q 4 SSI  Chronic Back Pain  P:  PRN IV Tylenol Lidoderm patch  FAMILY  - Updates: Patient updated at bedside 7/30 on NP rounds.  - Inter-disciplinary family meet or Palliative Care meeting due  by: 07/28/2018      Magdalen Spatz, AGACNP-BC [facility] [phone] 07/22/2018, 9:48 AM

## 2018-07-22 NOTE — Progress Notes (Signed)
[facility] Progress Note  1 Day Post-Op  Subjective: CC: mild abdominal pain Patient states he is feeling much better. Has some pain but it is greatly improved from yesterday. Denies nausea. No flatus. Wants something to drink.   Objective: Vital signs in last 24 hours: Temp:  [97.5 F (36.4 C)-98.7 F (37.1 C)] 98 F (36.7 C) (07/30 0740) Pulse Rate:  [61-89] 67 (07/30 0800) Resp:  [8-24] 17 (07/30 0800) BP: (91-139)/(38-57) 125/41 (07/30 0800) SpO2:  [93 %-100 %] 99 % (07/30 0800) Arterial Line BP: (81-174)/(28-53) 134/37 (07/30 0800) Weight:  [105.6 kg (232 lb 12.9 oz)] 105.6 kg (232 lb 12.9 oz) (07/30 0552) Last BM Date: 07/20/18  Intake/Output from previous day: 07/29 0701 - 07/30 0700 In: 3725.9 [I.V.:3235.8; NG/GT:90; IV Piggyback:400.1] Out: 980 [Urine:780; Emesis/NG output:150; Blood:50] Intake/Output this shift: Total I/O In: 85 [I.V.:85] Out: -   PE: Gen:  Alert, NAD, pleasant Card:  Regular rate and rhythm, pedal pulses 2+ BL Pulm:  Normal effort, clear to auscultation bilaterally Abd: Soft, appropriately tender, non-distended, bowel sounds present, midline incision clean with some bleeding at skin edge - controlled with silver nitrate applicator Skin: warm and dry, no rashes  Psych: A&Ox3   Lab Results:  Recent Labs    07/21/18 1837 07/22/18 0418  WBC 31.7* 21.3*  HGB 8.7* 8.0*  HCT 26.9* 24.9*  PLT 377 306   BMET Recent Labs    07/21/18 1837 07/22/18 0418  NA 135 137  K 4.0 4.1  CL 102 103  CO2 22 22  GLUCOSE 244* 171*  BUN 112* 115*  CREATININE 2.78* 2.82*  CALCIUM 8.1* 8.5*   PT/INR No results for input(s): LABPROT, INR in the last 72 hours. CMP     Component Value Date/Time   NA 137 07/22/2018 0418   NA 137 05/16/2018 1100   NA 139 03/29/2015 0926   K 4.1 07/22/2018 0418   K 4.4 03/29/2015 0926   CL 103 07/22/2018 0418   CL 108 (H) 06/09/2013 1218   CO2 22 07/22/2018 0418   CO2 24 03/29/2015 0926   GLUCOSE 171  (H) 07/22/2018 0418   GLUCOSE 239 (H) 03/29/2015 0926   GLUCOSE 235 (H) 06/09/2013 1218   BUN 115 (H) 07/22/2018 0418   BUN 59 (H) 05/16/2018 1100   BUN 25.8 03/29/2015 0926   CREATININE 2.82 (H) 07/22/2018 0418   CREATININE 1.2 03/29/2015 0926   CALCIUM 8.5 (L) 07/22/2018 0418   CALCIUM 8.9 03/29/2015 0926   PROT 6.7 07/21/2018 0444   PROT 6.6 12/18/2017 1443   PROT 6.5 03/29/2015 0926   ALBUMIN 3.3 (L) 07/21/2018 0444   ALBUMIN 4.1 12/18/2017 1443   ALBUMIN 3.5 03/29/2015 0926   AST 49 (H) 07/21/2018 0444   AST 20 03/29/2015 0926   ALT 33 07/21/2018 0444   ALT 17 03/29/2015 0926   ALKPHOS 239 (H) 07/21/2018 0444   ALKPHOS 136 03/29/2015 0926   BILITOT 1.0 07/21/2018 0444   BILITOT 0.8 12/18/2017 1443   BILITOT 0.63 03/29/2015 0926   GFRNONAA 21 (L) 07/22/2018 0418   GFRAA 24 (L) 07/22/2018 0418   Lipase     Component Value Date/Time   LIPASE 60 (H) 07/21/2018 0444       Studies/Results: Ct Abdomen Pelvis Wo Contrast  Result Date: 07/21/2018 CLINICAL DATA:  Abdominal pain. Vomiting. Elevated white blood cell count and lactic acid EXAM: CT ABDOMEN AND PELVIS WITHOUT CONTRAST TECHNIQUE: Multidetector CT imaging of the abdomen and pelvis was performed following  the standard protocol without IV contrast. COMPARISON:  Plain film 7299 FINDINGS: Lower chest:  Lung bases are clear. Hepatobiliary: Liver is mildly nodular. Postcholecystectomy. No biliary duct dilatation. Pancreas: Normal pancreatic parenchymal intensity. No ductal dilatation or inflammation. Spleen: Normal spleen. Adrenals/urinary tract: Adrenal glands and kidneys are normal. Normal bladder. Stomach/Bowel: Stomach is normal. Duodenum is normal. In the mid small bowel there is stasis with fecalization ofinternal contents. There is pneumatosis within the wall of the small bowel centrally in the upper abdomen (image 36/3). Additionally, there is portal venous gas veins of the mesentery of the upper small bowel as seen in  LEFT upper quadrant (image 44/3). Findings are consistent with small bowel ischemia. No obstructing lesion identified. Mild caliber change from proximal to distal small bowel. Vascular/Lymphatic: Venous mesenteric gas. No gas within the portal veins. Heavy calcification of the abdominal aorta. Calcifications at the ostia of the SMA and celiac trunk. Musculoskeletal: No aggressive osseous lesion Reproductive: Prostate normal. Small amount free fluid the pelvis. IMPRESSION: 1. Small bowel ischemia with pneumatosis within loops of bowel in the LEFT upper quadrant and gas within mesenteric veins. 2. No portal venous gas in the liver. 3. No evidence of high-grade bowel obstruction. There is some fecalization of enteric contents of the proximal small bowel. 4. Aortic Atherosclerosis (ICD10-I70.0). Calcifications about the ostia of the SMA and celiac trunk, patency cannot be evaluated on noncontrast exam. Critical Value/emergent results were called by telephone at the time of interpretation on 07/21/2018 at 10:13 am to Dr. [place] Cellar , who verbally acknowledged these results. Electronically Signed   By: Suzy Bouchard M.D.   On: 07/21/2018 10:17   Dg Chest Port 1 View  Result Date: 07/21/2018 CLINICAL DATA:  Central line placement EXAM: PORTABLE CHEST 1 VIEW COMPARISON:  07/21/2018 at 12:26 a.m. FINDINGS: Right IJ central venous catheter tip is at the cavoatrial junction. Left chest wall pacemaker is unchanged. Sequelae of median sternotomy and CABG. Nasogastric tube side port is at the gastroesophageal junction. Mild pulmonary edema. IMPRESSION: 1. Right IJ CVC tip at the cavoatrial junction.  No pneumothorax. 2. Nasogastric tube side port at the gastroesophageal junction. Recommend advancing by 5-7 cm. 3. Mild pulmonary edema. Electronically Signed   By: Ulyses Jarred M.D.   On: 07/21/2018 15:45   Dg Abd Portable 1v  Result Date: 07/21/2018 CLINICAL DATA:  Abdominal pain vomiting tonight. EXAM: PORTABLE  ABDOMEN - 1 VIEW COMPARISON:  CT abdomen and pelvis December 01, 2012 FINDINGS: Moderate amount of retained large bowel stool. Moderate amount of retained large bowel stool. A few loops of gas distended small large bowel. Surgical clips in the included right abdomen compatible with cholecystectomy. Phleboliths in the pelvis. Mild vascular calcifications. Mild dextroscoliosis. Ankylosis of the sacroiliac joints. Osteopenia. IMPRESSION: Moderate amount of retained large bowel stool with mild probable ileus, less likely early small bowel obstruction. Electronically Signed   By: Elon Alas M.D.   On: 07/21/2018 06:14    Anti-infectives: Anti-infectives (From admission, onward)   Start     Dose/Rate Route Frequency Ordered Stop   07/21/18 1315  cefoTEtan (CEFOTAN) 2 g in sodium chloride 0.9 % 100 mL IVPB     2 g 200 mL/hr over 30 Minutes Intravenous Every 12 hours 07/21/18 1309     07/21/18 1315  cefoTEtan (CEFOTAN) 2 g in sodium chloride 0.9 % 100 mL IVPB     2 g 200 mL/hr over 30 Minutes Intravenous To Surgery 07/21/18 1312 07/21/18 1210  Assessment/Plan HTN HLD T2DM COPD Hx of CAD S/p pacemaker placement A.Fib on eliquis - last dose yesterday, reversal agent given, s/p 1 unit PRBC Chronic Diastolic CHF/Chronic ischemic heart disease OSA on CPAP Hx of colonic polyps and moderate sigmoid diverticulosis GERD Non-toxic multinodular goiter AKI on CKD stage III - Cr 2.82, continue IVF, limit nephrotoxic agents Obesity  Small bowel ischemia with pneumatosis S/p exploratory laparotomy with small bowel resection and cecectomy 07/21/18 Dr. Brantley Stage - POD#1 - WBC 21.3 from 50.2 pre-op - NGT with 100cc drainage - continue NGT on LIWS - patient may have limited ice chips - leave WTD dressing today with bleeding and apply VAC tomorrow - mobilize as tolerated - d/c PCA  FEN: NPO with ice chips, IVF; NGT to LIWS VTE: SCDs, no chemical VTE with bleeding - may be able to start  tomorrow ID: [email or web address] 7/29>> Foley: none    LOS: 1 day    Brigid Re , [facility] 07/22/2018, 8:38 AM Pager: [phone] Consults: [phone] Mon-Fri 7:00 am-4:30 pm Sat-Sun 7:00 am-11:30 am

## 2018-07-22 NOTE — Telephone Encounter (Signed)
Not yet Let him recover from surgery Looks like he had ischemic bowel

## 2018-07-22 NOTE — Progress Notes (Signed)
Inpatient Diabetes Program Recommendations  AACE/ADA: New Consensus Statement on Inpatient Glycemic Control (2019)  Target Ranges:  Prepandial:   less than 140 mg/dL      Peak postprandial:   less than 180 mg/dL (1-2 hours)      Critically ill patients:  140 - 180 mg/dL   Results for Lawrence [name], Lawrence [name] (MRN [id number]) as of 07/22/2018 13:53  Ref. Range 07/22/2018 03:08 07/22/2018 04:16 07/22/2018 07:36 07/22/2018 11:56 07/22/2018 13:23  Glucose-Capillary Latest Ref Range: 70 - 99 mg/dL 172 (H) 165 (H) 222 (H) 250 (H) 265 (H)  Results for Lawrence [name], Lawrence [name] (MRN [id number]) as of 07/22/2018 13:53  Ref. Range 07/21/2018 20:16 07/21/2018 21:03 07/21/2018 22:05 07/21/2018 23:04 07/22/2018 00:02 07/22/2018 00:59 07/22/2018 02:13  Glucose-Capillary Latest Ref Range: 70 - 99 mg/dL 189 (H) 167 (H) 159 (H) 127 (H) 132 (H) 138 (H) 153 (H)   Review of Glycemic Control  Diabetes history: DM2 Outpatient Diabetes medications: Lantus 2-10 units daily if CBG >110 mg/dl Current orders for Inpatient glycemic control: Levemir 12 units Q12H, Novolog 1-3 units Q4H  Inpatient Diabetes Program Recommendations:  Outpatient DM medications: At time of discharge, please consider re-evaluating outpatient DM medications. Anticipate outpatient Lantus dose will need to be adjusted and patient may need short acting insulin as well.  NOTE: Noted consult for Diabetes Coordinator. Noted in reviewing chart that patient has been on Amaryl, Tradjenta, and Lantus in the past but oral DM medications were stopped due to hypoglycemia.  Went by to speak with patient regarding DM control and outpatient DM regimen.  Patient sitting up in chair and reports that he is in a lot of pain and he has called nursing and asked to be assisted back to bed. Spoke with patient briefly due to obvious pain (grimacing, guarding, constant repositoning in chair). Patient confirms that he was having issues with hypoglycemia in January and he was taken off Amaryl, Tradjenta,  and only takes Lantus based on correction scale. Patient states that over the past few weeks, glucose has been 250-350 mg/dl at home. Patient states that his wife helps him at home and can provide more information. Patient's wife is not currently in room. Informed patient that Diabetes Coordinator will follow up with him tomorrow if appropriate and try to talk with him and his wife. Patient verbalized understanding of information discussed. Talked with secretary on unit regarding patient request to get back in bed and pain; was informed that nursing was aware and is going in to assist patient.  Received page at 13:40 from RN regarding consult and hyperglycemia. Recommend patient be transitioned back to Phase 2 IV insulin to improve glycemic control and to help determine actual insulin needs.  Thanks, Barnie Alderman, RN, MSN, CDE Diabetes Coordinator Inpatient Diabetes Program [phone] (Team Pager from 8am to 5pm)

## 2018-07-22 NOTE — Progress Notes (Signed)
Nurse requested this family might appreciate a visit,  Patient was in surgery Monday and not available.  When I came on Tuesday Catholic person was visiting.  If need arises chaplain can be paged to come and provide care. Conard Novak, Chaplain   07/22/18 1200  Clinical Encounter Type  Visited With Patient not available;Other (Comment) Restaurant manager, fast food with patient)  Visit Type Spiritual support  Referral From Nurse  Consult/Referral To Chaplain ([place] day for [employer])  Spiritual Encounters  Spiritual Needs Other (Comment) (Provided by [employer])

## 2018-07-22 NOTE — Telephone Encounter (Signed)
Please advise. Thank you

## 2018-07-23 ENCOUNTER — Encounter (HOSPITAL_COMMUNITY): Payer: Self-pay | Admitting: *Deleted

## 2018-07-23 ENCOUNTER — Telehealth: Payer: Self-pay | Admitting: *Deleted

## 2018-07-23 ENCOUNTER — Inpatient Hospital Stay (HOSPITAL_COMMUNITY): Payer: Medicare Other

## 2018-07-23 DIAGNOSIS — K274 Chronic or unspecified peptic ulcer, site unspecified, with hemorrhage: Secondary | ICD-10-CM

## 2018-07-23 DIAGNOSIS — I361 Nonrheumatic tricuspid (valve) insufficiency: Secondary | ICD-10-CM

## 2018-07-23 LAB — BASIC METABOLIC PANEL
ANION GAP: 15 (ref 5–15)
BUN: 107 mg/dL — ABNORMAL HIGH (ref 8–23)
CALCIUM: 9 mg/dL (ref 8.9–10.3)
CO2: 23 mmol/L (ref 22–32)
Chloride: 102 mmol/L (ref 98–111)
Creatinine, Ser: 2.62 mg/dL — ABNORMAL HIGH (ref 0.61–1.24)
GFR calc non Af Amer: 23 mL/min — ABNORMAL LOW (ref >60)
GFR, EST AFRICAN AMERICAN: 26 mL/min — AB (ref >60)
Glucose, Bld: 149 mg/dL — ABNORMAL HIGH (ref 70–99)
Potassium: 3.8 mmol/L (ref 3.5–5.1)
Sodium: 140 mmol/L (ref 135–145)

## 2018-07-23 LAB — CBC
HEMATOCRIT: 24.2 % — AB (ref 39.0–52.0)
HEMOGLOBIN: 7.5 g/dL — AB (ref 13.0–17.0)
MCH: 31.5 pg (ref 26.0–34.0)
MCHC: 31 g/dL (ref 30.0–36.0)
MCV: 101.7 fL — ABNORMAL HIGH (ref 78.0–100.0)
Platelets: 292 10*3/uL (ref 150–400)
RBC: 2.38 MIL/uL — ABNORMAL LOW (ref 4.22–5.81)
RDW: 15.9 % — ABNORMAL HIGH (ref 11.5–15.5)
WBC: 14.5 10*3/uL — ABNORMAL HIGH (ref 4.0–10.5)

## 2018-07-23 LAB — ECHOCARDIOGRAM COMPLETE
Height: 72 in
Weight: 3742.53 oz

## 2018-07-23 LAB — GLUCOSE, CAPILLARY
GLUCOSE-CAPILLARY: 109 mg/dL — AB (ref 70–99)
GLUCOSE-CAPILLARY: 115 mg/dL — AB (ref 70–99)
Glucose-Capillary: 153 mg/dL — ABNORMAL HIGH (ref 70–99)
Glucose-Capillary: 117 mg/dL — ABNORMAL HIGH (ref 70–99)
Glucose-Capillary: 127 mg/dL — ABNORMAL HIGH (ref 70–99)

## 2018-07-23 MED ORDER — PERFLUTREN LIPID MICROSPHERE
1.0000 mL | INTRAVENOUS | Status: AC | PRN
  Administered 2018-07-23: 4 mL via INTRAVENOUS
  Filled 2018-07-23: qty 10

## 2018-07-23 NOTE — Progress Notes (Signed)
PULMONARY / CRITICAL CARE MEDICINE   Name: Lawrence [name] MRN: [id number] DOB: 1946/04/14    ADMISSION DATE:  07/21/2018 CONSULTATION DATE:  07/21/2018  REFERRING MD:  [facility]   CHIEF COMPLAINT:  Anemia   HISTORY OF PRESENT ILLNESS:   72 year old male with PMH of COPD, DM, HLD, HTN, CAD, S/P Pacemaker, A.Fib on Eliquis, Diastolic HF, OSA on CPAP at HS, H/O AVM Colonic Polys and Moderate Sigmoid Diverticulosis, GERD  Presents to [facility] ED on 7/29 with Abdominal Pain, Nausea, and reportedly two days of Hematochezia in which appeared black/tarry. Hemoglobin 8.8. Given 1 unit RBC at OSH. Eliquis reversed with Anti-Inhibitor Coagulant Complex. Patient transferred to [facility] for Further Evaluation.   On arrival to ICU patient is alert and oriented with systolic 035. On insulin gtt. Complaining of abdominal tenderness and nausea.    SUBJECTIVE: Awake alert on room air no acute distress may be ready for stepdown status  VITAL SIGNS: BP (!) 134/54 (BP Location: Left Arm)   Pulse 71   Temp (!) 97.5 F (36.4 C) (Oral)   Resp 13   Ht 6' (1.829 m)   Wt 233 lb 14.5 oz (106.1 kg)   SpO2 94%   BMI 31.72 kg/m   HEMODYNAMICS:    VENTILATOR SETTINGS:    INTAKE / OUTPUT: I/O last 3 completed shifts: In: 3540.2 [P.O.:120; I.V.:2730.2; NG/GT:90; IV Piggyback:600.1] Out: 0093 Gaye.Gallo; Emesis/NG output:100]  PHYSICAL EXAMINATION: General: Well-nourished well-developed male no acute distress HEENT: No JVD or lymphadenopathy is appreciated Neuro: Awake alert no acute distress CV: Irregular regular rate and rhythm PULM: even/non-labored, lungs bilaterally decreased in bases GI: Abdominal dressing is dry and intact.  Tender to touch faint bowel sounds Extremities: warm/dry, negative edema  Skin: no rashes or lesions   LABS:  BMET Recent Labs  Lab 07/21/18 1837 07/22/18 0418 07/23/18 0433  NA 135 137 140  K 4.0 4.1 3.8  CL 102 103 102  CO2 22 22 23   BUN 112* 115* 107*   CREATININE 2.78* 2.82* 2.62*  GLUCOSE 244* 171* 149*    Electrolytes Recent Labs  Lab 07/21/18 0444 07/21/18 1837 07/22/18 0418 07/23/18 0433  CALCIUM 9.3 8.1* 8.5* 9.0  MG 2.3  --   --   --   PHOS 3.6  --   --   --     CBC Recent Labs  Lab 07/21/18 1837 07/22/18 0418 07/23/18 0433  WBC 31.7* 21.3* 14.5*  HGB 8.7* 8.0* 7.5*  HCT 26.9* 24.9* 24.2*  PLT 377 306 292    Coag's No results for input(s): APTT, INR in the last 168 hours.  Sepsis Markers Recent Labs  Lab 07/21/18 0444 07/21/18 0654  LATICACIDVEN 4.4* 4.4*    ABG Recent Labs  Lab 07/21/18 1239  PHART 7.308*  PCO2ART 41.6  PO2ART 316.0*    Liver Enzymes Recent Labs  Lab 07/21/18 0444  AST 49*  ALT 33  ALKPHOS 239*  BILITOT 1.0  ALBUMIN 3.3*    Cardiac Enzymes Recent Labs  Lab 07/21/18 0444 07/21/18 1044  TROPONINI 0.03* 0.03*    Glucose Recent Labs  Lab 07/22/18 1323 07/22/18 1629 07/22/18 1918 07/22/18 2304 07/23/18 0316 07/23/18 0710  GLUCAP 265* 225* 215* 208* 153* 117*    Imaging No results found.   STUDIES:  CT Abd 7/29 >> small bowel ischemia with pneumatosis within loops of bowel in the left upper quadrant and gas of the mesenteric veins, no portal venous gas in the liver, no evidence  of high-grade bowel obstruction, aortic atherosclerosis  CULTURES: Blood 7/29 >> U/A 7/29 >>  ANTIBIOTICS: Cefotan 7/29>>  SIGNIFICANT EVENTS: 7/29 Admit  7/29>> S/p exploratory laparotomy with small bowel resection and cecectomy   LINES/TUBES: PIV   DISCUSSION: 72 year old male with PMH of AVM and diverticulosis on Eliquis admitted with with dark tarry stools, nausea and ABD pain.  Work up revealed lactic acidosis, CT with small bowel ischemia, pneumatosis.  To OR 7/29.     ASSESSMENT / PLAN:  OSA, COPD P:   Extubated and on room air May meet criteria for transfer to stepdown unit  Chronic Diastolic HF S/P Pacemaker  H/O A.Fib on Eliquis, HTN, CAD, HLD P:   ICU monitoring but may be ready for stepdown unit Holding anticoagulation for now   Acute on CKD [name] 3 (Basleline Crt 1.8-1.9)  Creatinine bump post op Lab Results  Component Value Date   CREATININE 2.62 (H) 07/23/2018   CREATININE 2.82 (H) 07/22/2018   CREATININE 2.78 (H) 07/21/2018   CREATININE 1.2 03/29/2015   CREATININE 1.4 (H) 12/28/2014   CREATININE 1.2 05/25/2014    P:   Trend creatinine Replete electrolytes as needed Creatinine starting to trend down but may need renal consult near future  Small bowel ischemia / pneumatosis with associated abdominal pain hematochezia - Dark Tarry  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 [facility] Endoscopy) Colonic Polys and Moderate Sigmoid Diverticulosis  GERD Obesity  -Followed by [facility], Planned Colonoscopy September  S/p exploratory laparotomy with small bowel resection and cecectomy 07/21/18 Lawrence [name] Some wound oozing 07/21/2018 am>> silver nitrate  P:   Day 2 postop Care with surgery followed  Acute Blood Loss Anemia  Iron Deficiency Anemia  Chronic anticoagulation -reversed on admission Oozing from wound 7/30 am>> controlled with silver nitrate per CCS P:  Anticoagulation on hold Silver nitrate stick applied per surgery Continue to monitor  Leukocytosis  Down trending since surgery P:   Follow culture data Trend white count fever curve Antibiotics narrow as able    Hyperglycemia  CBG (last 3)  Recent Labs    07/22/18 2304 07/23/18 0316 07/23/18 0710  GLUCAP 208* 153* 117*    DM   P:   CBGs per protocol  Chronic Back Pain  P:  PRN analgesics  FAMILY  - Updates: Patient updated 07/23/2018 - Inter-disciplinary family meet or Palliative Care meeting due by: 07/28/2018      Lawrence [name] ACNP Lawrence [name] PCCM Pager [phone] till 1 pm If no answer page [phone] 07/23/2018, 10:03 AM

## 2018-07-23 NOTE — Progress Notes (Signed)
2 Days Post-Op   Subjective/Chief Complaint: Looks well some flatus    Objective: Vital signs in last 24 hours: Temp:  [97.5 F (36.4 C)-98.2 F (36.8 C)] 97.5 F (36.4 C) (07/31 0700) Pulse Rate:  [66-79] 71 (07/31 0600) Resp:  [11-23] 12 (07/31 0600) BP: (109-140)/(41-59) 123/52 (07/31 0600) SpO2:  [89 %-100 %] 92 % (07/31 0600) Arterial Line BP: (133-171)/(37-48) 144/42 (07/30 1030) Weight:  [106.1 kg (233 lb 14.5 oz)] 106.1 kg (233 lb 14.5 oz) (07/31 0436) Last BM Date: 07/20/18  Intake/Output from previous day: 07/30 0701 - 07/31 0700 In: 2145.9 [P.O.:120; I.V.:1625.9; IV Piggyback:400] Out: 1275 [Urine:1275] Intake/Output this shift: No intake/output data recorded.  Incision/Wound:open clean minimal oozing noted soft ND  Lab Results:  Recent Labs    07/22/18 0418 07/23/18 0433  WBC 21.3* 14.5*  HGB 8.0* 7.5*  HCT 24.9* 24.2*  PLT 306 292   BMET Recent Labs    07/22/18 0418 07/23/18 0433  NA 137 140  K 4.1 3.8  CL 103 102  CO2 22 23  GLUCOSE 171* 149*  BUN 115* 107*  CREATININE 2.82* 2.62*  CALCIUM 8.5* 9.0   PT/INR No results for input(s): LABPROT, INR in the last 72 hours. ABG Recent Labs    07/21/18 1239  PHART 7.308*  HCO3 20.7    Studies/Results: Ct Abdomen Pelvis Wo Contrast  Result Date: 07/21/2018 CLINICAL DATA:  Abdominal pain. Vomiting. Elevated white blood cell count and lactic acid EXAM: CT ABDOMEN AND PELVIS WITHOUT CONTRAST TECHNIQUE: Multidetector CT imaging of the abdomen and pelvis was performed following the standard protocol without IV contrast. COMPARISON:  Plain film 7299 FINDINGS: Lower chest:  Lung bases are clear. Hepatobiliary: Liver is mildly nodular. Postcholecystectomy. No biliary duct dilatation. Pancreas: Normal pancreatic parenchymal intensity. No ductal dilatation or inflammation. Spleen: Normal spleen. Adrenals/urinary tract: Adrenal glands and kidneys are normal. Normal bladder. Stomach/Bowel: Stomach is normal.  Duodenum is normal. In the mid small bowel there is stasis with fecalization ofinternal contents. There is pneumatosis within the wall of the small bowel centrally in the upper abdomen (image 36/3). Additionally, there is portal venous gas veins of the mesentery of the upper small bowel as seen in LEFT upper quadrant (image 44/3). Findings are consistent with small bowel ischemia. No obstructing lesion identified. Mild caliber change from proximal to distal small bowel. Vascular/Lymphatic: Venous mesenteric gas. No gas within the portal veins. Heavy calcification of the abdominal aorta. Calcifications at the ostia of the SMA and celiac trunk. Musculoskeletal: No aggressive osseous lesion Reproductive: Prostate normal. Small amount free fluid the pelvis. IMPRESSION: 1. Small bowel ischemia with pneumatosis within loops of bowel in the LEFT upper quadrant and gas within mesenteric veins. 2. No portal venous gas in the liver. 3. No evidence of high-grade bowel obstruction. There is some fecalization of enteric contents of the proximal small bowel. 4. Aortic Atherosclerosis (ICD10-I70.0). Calcifications about the ostia of the SMA and celiac trunk, patency cannot be evaluated on noncontrast exam. Critical Value/emergent results were called by telephone at the time of interpretation on 07/21/2018 at 10:13 am to Dr. [place] Cellar , who verbally acknowledged these results. Electronically Signed   By: Suzy Bouchard M.D.   On: 07/21/2018 10:17   Dg Chest Port 1 View  Result Date: 07/22/2018 CLINICAL DATA:  Small bowel ischemia, shortness of breath, coronary artery disease, chronic ischemic heart disease, essential benign hypertension, type II diabetes mellitus, former smoker EXAM: PORTABLE CHEST 1 VIEW COMPARISON:  Portable exam 0502 hours  compared to 07/21/2018 FINDINGS: Nasogastric tube extends into abdomen. RIGHT jugular central venous catheter tip projects over SVC near cavoatrial junction. LEFT subclavian  sequential pacemaker leads project over RIGHT atrium and RIGHT ventricle, unchanged. Upper normal size of cardiac silhouette post CABG. Mediastinal contours and pulmonary vascularity normal. Improved interstitial infiltrates question edema. No pleural effusion or pneumothorax. Bones demineralized. IMPRESSION: Probable mild pulmonary edema, slightly improved. Electronically Signed   By: Lavonia Dana M.D.   On: 07/22/2018 09:04   Dg Chest Port 1 View  Result Date: 07/21/2018 CLINICAL DATA:  Central line placement EXAM: PORTABLE CHEST 1 VIEW COMPARISON:  07/21/2018 at 12:26 a.m. FINDINGS: Right IJ central venous catheter tip is at the cavoatrial junction. Left chest wall pacemaker is unchanged. Sequelae of median sternotomy and CABG. Nasogastric tube side port is at the gastroesophageal junction. Mild pulmonary edema. IMPRESSION: 1. Right IJ CVC tip at the cavoatrial junction.  No pneumothorax. 2. Nasogastric tube side port at the gastroesophageal junction. Recommend advancing by 5-7 cm. 3. Mild pulmonary edema. Electronically Signed   By: Ulyses Jarred M.D.   On: 07/21/2018 15:45    Anti-infectives: Anti-infectives (From admission, onward)   Start     Dose/Rate Route Frequency Ordered Stop   07/21/18 1315  cefoTEtan (CEFOTAN) 2 g in sodium chloride 0.9 % 100 mL IVPB     2 g 200 mL/hr over 30 Minutes Intravenous Every 12 hours 07/21/18 1309     07/21/18 1315  cefoTEtan (CEFOTAN) 2 g in sodium chloride 0.9 % 100 mL IVPB     2 g 200 mL/hr over 30 Minutes Intravenous To Surgery 07/21/18 1312 07/21/18 1210      Assessment/Plan: s/p Procedure(s): EXPLORATORY LAPAROTOMY WITH SMALL BOWEL RESECTION (N/A)   HTN HLD T2DM COPD Hx of CAD S/p pacemaker placement A.Fib on eliquis - last dose yesterday, reversal agent given, s/p 1 unit PRBC Chronic Diastolic CHF/Chronic ischemic heart disease OSA on CPAP Hx of colonic polyps and moderate sigmoid diverticulosis GERD Non-toxic multinodular goiter AKI  on CKD stage III - Cr 2.82, continue IVF, limit nephrotoxic agents Obesity  Small bowel ischemia with pneumatosis S/p exploratory laparotomy with small bowel resection and cecectomy 07/21/18 Dr. Brantley Stage - POD#2 - WBC 14.5  from 50.2 pre-op - NGT with 100cc drainage - continue NGT on LIWS  Remove today  - patient may have limited ice chips - apply VAC  - mobilize as tolerated - d/c PCA  Restart anticoagulation in 24 hours     LOS: 2 days    Marcello Moores A [place] Lebeau 07/23/2018

## 2018-07-23 NOTE — Progress Notes (Signed)
  Echocardiogram 2D Echocardiogram has been performed.  Darlina Sicilian M 07/23/2018, 1:33 PM

## 2018-07-23 NOTE — Consult Note (Addendum)
[facility] Nurse wound consult note Reason for Consult: Apply NPWT (VAC) therapy to midline abdominal wound Wound type:surgical  Pressure Injury POA: NA Measurement: 15 cm x 4 cm x 4.6 cm  Wound FRT:MYTR pink.  Adipose tissue noted in wound bed Drainage (amount, consistency, odor) minimal serosanguinous  No odor Periwound:intact Dressing procedure/placement/frequency: 1 [piece black foam. Seal immediately achieved.  Tolerated procedure well.  Change Mon/Wed/Friday .  Bedside RN to perform  Will not follow at this time.  Please re-consult if needed.  Domenic Moras RN BSN [place] Pager [phone]

## 2018-07-23 NOTE — Progress Notes (Signed)
Inpatient Diabetes Program Recommendations  AACE/ADA: New Consensus Statement on Inpatient Glycemic Control (2015)  Target Ranges:  Prepandial:   less than 140 mg/dL      Peak postprandial:   less than 180 mg/dL (1-2 hours)      Critically ill patients:  140 - 180 mg/dL   Lab Results  Component Value Date   GLUCAP 109 (H) 07/23/2018   HGBA1C 5.6 01/03/2018    Review of Glycemic ControlResults for Lawrence [name], Lawrence [name] (MRN [id number]) as of 07/23/2018 14:48  Ref. Range 07/22/2018 19:18 07/22/2018 23:04 07/23/2018 03:16 07/23/2018 07:10 07/23/2018 11:13  Glucose-Capillary Latest Ref Range: 70 - 99 mg/dL 215 (H) 208 (H) 153 (H) 117 (H) 109 (H)   Diabetes history: DM2 Outpatient Diabetes medications: Lantus 2-10 units daily if CBG >110 mg/dl Current orders for Inpatient glycemic control: Levemir 12 units Q12H, Novolog resistant q 4 hours  Inpatient Diabetes Program Recommendations:    Blood sugars improved.  I was able to talk to patient and wife today regarding home DM management. No A1C available however patient and wife state that prior to admit blood sugars were in the 400-500's.  Evidently patient was taken off medications at the beginning of the year due to low blood sugars.  Wife states that she was varying doses of insulin based on patient's blood sugars, however he was only on Lantus at home.  I explained the profile of Lantus and that it is a long acting basal insulin and is given once a day.  We discussed that patient may need rapid acting insulin with meals and that dose may need to be varied based on blood sugar.  I discussed that rapid acting insulin has much quicker action time (15 minutes) and lasts 3-4 hours.  Upon d/c home, he will likely need standing dose of basal insulin with a correction scale with meals.  Wife seems comfortable with this and seems to understand that blood sugars need to be better controlled.  Blood sugars improved today.  Will follow.    Thanks,  Adah Perl, RN,  BC-ADM Inpatient Diabetes Coordinator Pager [phone] (8a-5p)

## 2018-07-23 NOTE — Progress Notes (Signed)
Pt arrived to unit [facility] from ICU, transfer to bed via max assist. AA&ox4. Wound vac intact to mid abd with minimal drainage. VSS. NAD noted. Oriented to unit and white board. Nursing will continue to monitor.

## 2018-07-23 NOTE — Telephone Encounter (Signed)
Wife phoned to let us know pt is in ICU at [facility]. Had blockage and needed some of intestine removed. Wife wanted Dr. Edythe Clarity know

## 2018-07-24 DIAGNOSIS — R109 Unspecified abdominal pain: Secondary | ICD-10-CM

## 2018-07-24 DIAGNOSIS — R52 Pain, unspecified: Secondary | ICD-10-CM

## 2018-07-24 DIAGNOSIS — R1010 Upper abdominal pain, unspecified: Secondary | ICD-10-CM

## 2018-07-24 LAB — MAGNESIUM: Magnesium: 2.3 mg/dL (ref 1.7–2.4)

## 2018-07-24 LAB — GLUCOSE, CAPILLARY
GLUCOSE-CAPILLARY: 123 mg/dL — AB (ref 70–99)
Glucose-Capillary: 113 mg/dL — ABNORMAL HIGH (ref 70–99)
Glucose-Capillary: 115 mg/dL — ABNORMAL HIGH (ref 70–99)
Glucose-Capillary: 131 mg/dL — ABNORMAL HIGH (ref 70–99)
Glucose-Capillary: 140 mg/dL — ABNORMAL HIGH (ref 70–99)
Glucose-Capillary: 95 mg/dL (ref 70–99)

## 2018-07-24 LAB — BASIC METABOLIC PANEL
Anion gap: 13 (ref 5–15)
BUN: 86 mg/dL — AB (ref 8–23)
CHLORIDE: 107 mmol/L (ref 98–111)
CO2: 23 mmol/L (ref 22–32)
CREATININE: 2.25 mg/dL — AB (ref 0.61–1.24)
Calcium: 9.2 mg/dL (ref 8.9–10.3)
GFR calc non Af Amer: 27 mL/min — ABNORMAL LOW (ref >60)
GFR, EST AFRICAN AMERICAN: 32 mL/min — AB (ref >60)
Glucose, Bld: 128 mg/dL — ABNORMAL HIGH (ref 70–99)
Potassium: 4 mmol/L (ref 3.5–5.1)
Sodium: 143 mmol/L (ref 135–145)

## 2018-07-24 LAB — CBC
HEMATOCRIT: 25.5 % — AB (ref 39.0–52.0)
HEMOGLOBIN: 7.8 g/dL — AB (ref 13.0–17.0)
MCH: 30.7 pg (ref 26.0–34.0)
MCHC: 30.6 g/dL (ref 30.0–36.0)
MCV: 100.4 fL — ABNORMAL HIGH (ref 78.0–100.0)
Platelets: 344 10*3/uL (ref 150–400)
RBC: 2.54 MIL/uL — ABNORMAL LOW (ref 4.22–5.81)
RDW: 15.5 % (ref 11.5–15.5)
WBC: 14.6 10*3/uL — AB (ref 4.0–10.5)

## 2018-07-24 LAB — PHOSPHORUS: PHOSPHORUS: 4.6 mg/dL (ref 2.5–4.6)

## 2018-07-24 MED ORDER — OXYCODONE HCL 5 MG PO TABS
5.0000 mg | ORAL_TABLET | ORAL | Status: DC | PRN
  Administered 2018-07-25: 5 mg via ORAL
  Filled 2018-07-24 (×2): qty 1

## 2018-07-24 MED ORDER — ACETAMINOPHEN 325 MG PO TABS
650.0000 mg | ORAL_TABLET | Freq: Four times a day (QID) | ORAL | Status: DC
  Administered 2018-07-24 – 2018-07-26 (×8): 650 mg via ORAL
  Filled 2018-07-24 (×9): qty 2

## 2018-07-24 MED ORDER — METHOCARBAMOL 500 MG PO TABS
500.0000 mg | ORAL_TABLET | Freq: Four times a day (QID) | ORAL | Status: DC | PRN
  Administered 2018-07-25: 500 mg via ORAL
  Filled 2018-07-24: qty 1

## 2018-07-24 NOTE — Progress Notes (Signed)
Pt set up on Cpap full face mask patient states that's his home regimen.  Currently @ [facility] tolerating well.

## 2018-07-24 NOTE — Progress Notes (Signed)
[facility] Progress Note  3 Days Post-Op  Subjective: CC:  complain of back pain 2/2 falling while transferring to a different bed last night. States he did not fall on the floor but against the bed, now has tenderness over his throacic spine. States third shift nurses "yanked" him. Otherwise has been doing ok - pain controlled during the day, pulling over 1000 cc on IS, small amts flatus yesterday, no BM. Denies fever, chills, nausea, vomiting. Reports recent history of LLE hairline FX so prior to surgery was mobilizing with a cane   Objective: Vital signs in last 24 hours: Temp:  [97.6 F (36.4 C)-98.2 F (36.8 C)] 97.6 F (36.4 C) (08/01 0530) Pulse Rate:  [67-96] 96 (08/01 0530) Resp:  [11-23] 16 (08/01 0530) BP: (120-159)/(44-76) 146/67 (08/01 0530) SpO2:  [89 %-97 %] 97 % (08/01 0530) Last BM Date: 07/20/18  Intake/Output from previous day: 07/31 0701 - 08/01 0700 In: 1117.2 [I.V.:1017.1; IV Piggyback:100.1] Out: 2000 [Urine:2000] Intake/Output this shift: Total I/O In: -  Out: 125 [Urine:125]  PE: Gen:  Alert, NAD, cooperative Card:  Regular rate and rhythm Pulm:  Normal effort Abd: Soft, mild tenderness, VAC in place with good seal, hypoactive BS Skin: warm and dry, no rashes  Psych: A&Ox3   Lab Results:  Recent Labs    07/22/18 0418 07/23/18 0433  WBC 21.3* 14.5*  HGB 8.0* 7.5*  HCT 24.9* 24.2*  PLT 306 292   BMET Recent Labs    07/23/18 0433 07/24/18 0537  NA 140 143  K 3.8 4.0  CL 102 107  CO2 23 23  GLUCOSE 149* 128*  BUN 107* 86*  CREATININE 2.62* 2.25*  CALCIUM 9.0 9.2   CMP     Component Value Date/Time   NA 143 07/24/2018 0537   NA 137 05/16/2018 1100   NA 139 03/29/2015 0926   K 4.0 07/24/2018 0537   K 4.4 03/29/2015 0926   CL 107 07/24/2018 0537   CL 108 (H) 06/09/2013 1218   CO2 23 07/24/2018 0537   CO2 24 03/29/2015 0926   GLUCOSE 128 (H) 07/24/2018 0537   GLUCOSE 239 (H) 03/29/2015 0926   GLUCOSE 235 (H)  06/09/2013 1218   BUN 86 (H) 07/24/2018 0537   BUN 59 (H) 05/16/2018 1100   BUN 25.8 03/29/2015 0926   CREATININE 2.25 (H) 07/24/2018 0537   CREATININE 1.2 03/29/2015 0926   CALCIUM 9.2 07/24/2018 0537   CALCIUM 8.9 03/29/2015 0926   PROT 6.7 07/21/2018 0444   PROT 6.6 12/18/2017 1443   PROT 6.5 03/29/2015 0926   ALBUMIN 3.3 (L) 07/21/2018 0444   ALBUMIN 4.1 12/18/2017 1443   ALBUMIN 3.5 03/29/2015 0926   AST 49 (H) 07/21/2018 0444   AST 20 03/29/2015 0926   ALT 33 07/21/2018 0444   ALT 17 03/29/2015 0926   ALKPHOS 239 (H) 07/21/2018 0444   ALKPHOS 136 03/29/2015 0926   BILITOT 1.0 07/21/2018 0444   BILITOT 0.8 12/18/2017 1443   BILITOT 0.63 03/29/2015 0926   GFRNONAA 27 (L) 07/24/2018 0537   GFRAA 32 (L) 07/24/2018 0537   Lipase     Component Value Date/Time   LIPASE 60 (H) 07/21/2018 0444   Anti-infectives: Anti-infectives (From admission, onward)   Start     Dose/Rate Route Frequency Ordered Stop   07/21/18 1315  cefoTEtan (CEFOTAN) 2 g in sodium chloride 0.9 % 100 mL IVPB     2 g 200 mL/hr over 30 Minutes Intravenous Every 12 hours 07/21/18  1309     07/21/18 1315  cefoTEtan (CEFOTAN) 2 g in sodium chloride 0.9 % 100 mL IVPB     2 g 200 mL/hr over 30 Minutes Intravenous To Surgery 07/21/18 1312 07/21/18 1210     Assessment/Plan HTN HLD T2DM COPD Hx of CAD S/p pacemaker placement A.Fib on eliquis - last dose yesterday, reversal agent given, s/p 1 unit PRBC Chronic Diastolic CHF/Chronic ischemic heart disease OSA on CPAP Hx of colonic polyps and moderate sigmoid diverticulosis GERD Non-toxic multinodular goiter AKI on CKD stage III - Cr 2.82, continue IVF, limit nephrotoxic agents Obesity  Small bowel ischemia with pneumatosis S/p exploratory laparotomy with small bowel resection and cecectomy 07/21/18 Dr. Brantley Stage -POD#3 - WBC 14.5 on 7/31 from 50.2 pre-op - NGT removed 7/31,  - patient may have sips from the floor + ice.  - VAC placed 7/31 - PT Eval  for mobilization, reported recent history of LLE hairline FX.   FEN: sips from floor/ice chips ID: cefotetan 7/29 >> VTE: SCD's, start chemical VTE today if hgb/hct remain stable  Foley: none   LOS: 3 days    Jill Alexanders , [facility] 07/24/2018, 11:06 AM Pager: [phone] Consults: [phone] Mon-Fri 7:00 am-4:30 pm Sat-Sun 7:00 am-11:30 am

## 2018-07-24 NOTE — Care Management Important Message (Signed)
Important Message  Patient Details  Name: Lawrence [name] MRN: [id number] Date of Birth: Jul 22, 1946   Medicare Important Message Given:  Yes    Orbie Pyo 07/24/2018, 1:46 PM

## 2018-07-24 NOTE — Progress Notes (Signed)
[facility]                                                                              Patient Demographics  Lawrence [name], is a 72 y.o. male, DOB - May 02, 1946, HDQ:[id number]  Admit date - 07/21/2018   Admitting Physician Collene Gobble, MD  Outpatient Primary MD for the patient is [name], MD  Outpatient specialists:   LOS - 3  days   Medical records reviewed and are as summarized below:    No chief complaint on file.      Brief summary   Patient was admitted by critical care service on 07/21/2018, per admission note by Ms. Eubanks 72 year old male with PMH of COPD, DM, HLD, HTN, CAD, S/P Pacemaker, A.Fib on Eliquis, Diastolic HF, OSA on CPAP at HS, H/O AVM Colonic Polys and Moderate Sigmoid Diverticulosis, GERD.  Presents to [facility] ED on 7/29 with Abdominal Pain, Nausea, and reportedly two days of Hematochezia in which appeared black/tarry. Hemoglobin 8.8. Given 1 unit RBC at OSH. Eliquis reversed with Anti-Inhibitor Coagulant Complex. Patient transferred to [facility] for Further Evaluation.  On arrival to ICU patient is alert and oriented with systolic 119. On insulin gtt. Complaining of abdominal tenderness and nausea.  Patient was admitted to ICU, found to have small bowel ischemia/pneumatosis, hematochezia, underwent exploratory laparotomy with small bowel resection and seek cecectomy on 07/21/2018 (Dr Brantley Stage).  He was extubated on 7/30. Patient was transferred to hospitalist service on 8/1 and [facility] assumed care on 8/1.    Assessment & Plan    Principal problem Small bowel ischemia/pneumatosis with hematochezia -Patient was seen by GI and general surgery. -Patient underwent exploratory laparotomy with small bowel resection and cecectomy on 07/21/2018, postop day #3 -Status post wound VAC on 7/31, NGT removed on 7/31, surgery following, management per general surgery  Active problems Acute back pain/fall -Patient and family extremely  upset about falling from the bed last night while transferring from the ICU.  Patient upset about the third shift nurses "yanking him" and now complaining of back pain thoracic and lumbar spine.  -On examination, patient has mild upper lumbar spine and lower thoracic spine  tenderness, states new although he has chronic backaches.  No focal neurological deficits on neurological examination -Ordered thoracic and lumbar spine x-ray, PT eval. continue Lidoderm patch, pain control.  Acute blood loss anemia, iron deficiency -Likely due to #1, anticoagulation currently on hold -Baseline hemoglobin 10-11, hemoglobin 7.5 on 7/31 -Check CBC today   Diabetes mellitus, type II -Diabetic coordinator following, CBGs now improving -Continue Lantus, sliding scale insulin -Obtain hemoglobin A1c in a.m.   History of diastolic CHF, CAD -Currently stable, follow I's and O's closely, net positive of 2.8 L - PTA patient was on Lasix and metolazone, currently n.p.o. -Once patient on diet, will start low-dose Lasix to avoid volume overload -Currently no chest pain or shortness of breath  Paroxysmal atrial fibrillation, pacemaker -Anticoagulation currently on hold due to #1 -Rate controlled, not on any meds   Code Status: Full code DVT Prophylaxis: SCDs Family Communication: Discussed in detail with the patient, all imaging  results, lab results explained to the patient and wife in the room.  Patient's wife was filming the encounter on the phone and was advised that it was against policy without physicians consent   Disposition Plan:  Time Spent in minutes  1hour   Procedures:   Procedure: Exploratory laparotomy small bowel resection and cecectomy     Consultants:   General surgery GI Patient was admitted by CCM  Antimicrobials:   Cefotetan   Medications  Scheduled Meds: . acetaminophen  650 mg Oral Q6H  . chlorhexidine  15 mL Mouth Rinse BID  . Chlorhexidine Gluconate Cloth  6 each  Topical Daily  . insulin aspart  0-20 Units Subcutaneous Q4H  . insulin detemir  12 Units Subcutaneous Q12H  . lidocaine  1 patch Transdermal Q24H  . mouth rinse  15 mL Mouth Rinse q12n4p  . pantoprazole  40 mg Intravenous QHS  . sodium chloride flush  10-40 mL Intracatheter Q12H   Continuous Infusions: . sodium chloride    . sodium chloride 75 mL/hr at 07/24/18 1012  . sodium chloride Stopped (07/22/18 1744)  . cefoTEtan (CEFOTAN) 2 GM IVPB (Mini-Bag Plus) Stopped (07/24/18 0035)   PRN Meds:.sodium chloride, HYDROmorphone (DILAUDID) injection, ipratropium-albuterol, methocarbamol, ondansetron (ZOFRAN) IV, oxyCODONE, sodium chloride flush   Antibiotics   Anti-infectives (From admission, onward)   Start     Dose/Rate Route Frequency Ordered Stop   07/21/18 1315  cefoTEtan (CEFOTAN) 2 g in sodium chloride 0.9 % 100 mL IVPB     2 g 200 mL/hr over 30 Minutes Intravenous Every 12 hours 07/21/18 1309     07/21/18 1315  cefoTEtan (CEFOTAN) 2 g in sodium chloride 0.9 % 100 mL IVPB     2 g 200 mL/hr over 30 Minutes Intravenous To Surgery 07/21/18 1312 07/21/18 1210        Subjective:   Lawrence [name] was seen and examined today.  Patient extremely upset and complaining of back pain secondary to falling from the bed while transferring from ICU.  Upset about nursing care at night.  Complaining of back pain in the lower thoracic and upper lumbar spine.  Denies any fevers, chills, nausea and vomiting.      Objective:   Vitals:   07/23/18 2324 07/24/18 0042 07/24/18 0217 07/24/18 0530  BP: (!) 158/62  (!) 151/69 (!) 146/67  Pulse: 70 72 95 96  Resp: 16 18 16 16   Temp: 98 F (36.7 C)  97.9 F (36.6 C) 97.6 F (36.4 C)  TempSrc: Oral  Oral Oral  SpO2: 93% 96% 92% 97%  Weight:      Height:        Intake/Output Summary (Last 24 hours) at 07/24/2018 1134 Last data filed at 07/24/2018 0930 Gross per 24 hour  Intake 836.94 ml  Output 1775 ml  Net -938.06 ml     Wt Readings from  Last 3 Encounters:  07/23/18 106.1 kg (233 lb 14.5 oz)  07/16/18 104.3 kg (230 lb)  07/08/18 101.2 kg (223 lb)     Exam  General: Alert and oriented x 3, NAD  Eyes:  HEENT:  Atraumatic, normocephalic  Cardiovascular: S1 S2 auscultated, irregular.  Respiratory: Clear to auscultation bilaterally, no wheezing, rales or rhonchi  Gastrointestinal: Soft, mild tenderness with VAC in place, hypoactive bowel sounds   Ext: no pedal edema bilaterally  Neuro: AAOx3, Strength 5/5 upper and lower extremities bilaterally, speech clear, mild tenderness on the lower thoracic and upper lumbar spine   Musculoskeletal: No digital  cyanosis, clubbing  Skin: No rashes  Psych: frustrated demeanor, alert and oriented x3    Data Reviewed:  I have personally reviewed following labs and imaging studies  Micro Results Recent Results (from the past 240 hour(s))  Culture, blood (routine x 2)     Status: None (Preliminary result)   Collection Time: 07/21/18  4:35 AM  Result Value Ref Range Status   Specimen Description BLOOD LEFT ARM  Final   Special Requests   Final    BOTTLES DRAWN AEROBIC ONLY Blood Culture adequate volume   Culture   Final    NO GROWTH 2 DAYS Performed at [facility], [address], [place], [place] [address]    Report Status PENDING  Incomplete  Culture, blood (routine x 2)     Status: None (Preliminary result)   Collection Time: 07/21/18  4:40 AM  Result Value Ref Range Status   Specimen Description BLOOD LEFT ARM  Final   Special Requests   Final    BOTTLES DRAWN AEROBIC ONLY Blood Culture adequate volume   Culture   Final    NO GROWTH 2 DAYS Performed at [facility], [address], [place], [place] [address]    Report Status PENDING  Incomplete  MRSA PCR Screening     Status: None   Collection Time: 07/21/18  4:49 AM  Result Value Ref Range Status   MRSA by PCR NEGATIVE NEGATIVE Final    Comment:        The GeneXpert MRSA Assay (FDA approved  for NASAL specimens only), is one component of a comprehensive MRSA colonization surveillance program. It is not intended to diagnose MRSA infection nor to guide or monitor treatment for MRSA infections. Performed at [facility], [address], [place], [place] [address]   Surgical pcr screen     Status: None   Collection Time: 07/21/18 10:37 AM  Result Value Ref Range Status   MRSA, PCR NEGATIVE NEGATIVE Final   Staphylococcus aureus NEGATIVE NEGATIVE Final    Comment: (NOTE) The Xpert SA Assay (FDA approved for NASAL specimens in patients 55 years of age and older), is one component of a comprehensive surveillance program. It is not intended to diagnose infection nor to guide or monitor treatment. Performed at [facility], [place] [address], [place],  [address]     Radiology Reports Ct Abdomen Pelvis Wo Contrast  Result Date: 07/21/2018 CLINICAL DATA:  Abdominal pain. Vomiting. Elevated white blood cell count and lactic acid EXAM: CT ABDOMEN AND PELVIS WITHOUT CONTRAST TECHNIQUE: Multidetector CT imaging of the abdomen and pelvis was performed following the standard protocol without IV contrast. COMPARISON:  Plain film 7299 FINDINGS: Lower chest:  Lung bases are clear. Hepatobiliary: Liver is mildly nodular. Postcholecystectomy. No biliary duct dilatation. Pancreas: Normal pancreatic parenchymal intensity. No ductal dilatation or inflammation. Spleen: Normal spleen. Adrenals/urinary tract: Adrenal glands and kidneys are normal. Normal bladder. Stomach/Bowel: Stomach is normal. Duodenum is normal. In the mid small bowel there is stasis with fecalization ofinternal contents. There is pneumatosis within the wall of the small bowel centrally in the upper abdomen (image 36/3). Additionally, there is portal venous gas veins of the mesentery of the upper small bowel as seen in LEFT upper quadrant (image 44/3). Findings are consistent with small bowel ischemia. No obstructing  lesion identified. Mild caliber change from proximal to distal small bowel. Vascular/Lymphatic: Venous mesenteric gas. No gas within the portal veins. Heavy calcification of the abdominal  aorta. Calcifications at the ostia of the SMA and celiac trunk. Musculoskeletal: No aggressive osseous lesion Reproductive: Prostate normal. Small amount free fluid the pelvis. IMPRESSION: 1. Small bowel ischemia with pneumatosis within loops of bowel in the LEFT upper quadrant and gas within mesenteric veins. 2. No portal venous gas in the liver. 3. No evidence of high-grade bowel obstruction. There is some fecalization of enteric contents of the proximal small bowel. 4. Aortic Atherosclerosis (ICD10-I70.0). Calcifications about the ostia of the SMA and celiac trunk, patency cannot be evaluated on noncontrast exam. Critical Value/emergent results were called by telephone at the time of interpretation on 07/21/2018 at 10:13 am to Dr. Sumas Cellar , who verbally acknowledged these results. Electronically Signed   By: Suzy Bouchard M.D.   On: 07/21/2018 10:17   Dg Chest Port 1 View  Result Date: 07/22/2018 CLINICAL DATA:  Small bowel ischemia, shortness of breath, coronary artery disease, chronic ischemic heart disease, essential benign hypertension, type II diabetes mellitus, former smoker EXAM: PORTABLE CHEST 1 VIEW COMPARISON:  Portable exam 0502 hours compared to 07/21/2018 FINDINGS: Nasogastric tube extends into abdomen. RIGHT jugular central venous catheter tip projects over SVC near cavoatrial junction. LEFT subclavian sequential pacemaker leads project over RIGHT atrium and RIGHT ventricle, unchanged. Upper normal size of cardiac silhouette post CABG. Mediastinal contours and pulmonary vascularity normal. Improved interstitial infiltrates question edema. No pleural effusion or pneumothorax. Bones demineralized. IMPRESSION: Probable mild pulmonary edema, slightly improved. Electronically Signed   By: Lavonia Dana  M.D.   On: 07/22/2018 09:04   Dg Chest Port 1 View  Result Date: 07/21/2018 CLINICAL DATA:  Central line placement EXAM: PORTABLE CHEST 1 VIEW COMPARISON:  07/21/2018 at 12:26 a.m. FINDINGS: Right IJ central venous catheter tip is at the cavoatrial junction. Left chest wall pacemaker is unchanged. Sequelae of median sternotomy and CABG. Nasogastric tube side port is at the gastroesophageal junction. Mild pulmonary edema. IMPRESSION: 1. Right IJ CVC tip at the cavoatrial junction.  No pneumothorax. 2. Nasogastric tube side port at the gastroesophageal junction. Recommend advancing by 5-7 cm. 3. Mild pulmonary edema. Electronically Signed   By: Ulyses Jarred M.D.   On: 07/21/2018 15:45   Dg Abd Portable 1v  Result Date: 07/21/2018 CLINICAL DATA:  Abdominal pain vomiting tonight. EXAM: PORTABLE ABDOMEN - 1 VIEW COMPARISON:  CT abdomen and pelvis December 01, 2012 FINDINGS: Moderate amount of retained large bowel stool. Moderate amount of retained large bowel stool. A few loops of gas distended small large bowel. Surgical clips in the included right abdomen compatible with cholecystectomy. Phleboliths in the pelvis. Mild vascular calcifications. Mild dextroscoliosis. Ankylosis of the sacroiliac joints. Osteopenia. IMPRESSION: Moderate amount of retained large bowel stool with mild probable ileus, less likely early small bowel obstruction. Electronically Signed   By: Elon Alas M.D.   On: 07/21/2018 06:14    Lab Data:  CBC: Recent Labs  Lab 07/21/18 0444 07/21/18 1044 07/21/18 1239 07/21/18 1252 07/21/18 1837 07/22/18 0418 07/23/18 0433  WBC 24.2* 50.2*  --   --  31.7* 21.3* 14.5*  NEUTROABS 22.4*  --   --   --   --   --   --   HGB 10.4* 11.0* 8.8* 9.2* 8.7* 8.0* 7.5*  HCT 31.4* 31.6* 26.0* 27.0* 26.9* 24.9* 24.2*  MCV 95.7 90.8  --   --  97.1 98.4 101.7*  PLT 364 458*  --   --  377 306 128   Basic Metabolic Panel: Recent Labs  Lab 07/21/18 0444  07/21/18 1252 07/21/18 1837  07/22/18 0418 07/23/18 0433 07/24/18 0537  NA 133*   < > 135 135 137 140 143  K 3.3*   < > 3.7 4.0 4.1 3.8 4.0  CL 91*  --   --  102 103 102 107  CO2 24  --   --  22 22 23 23   GLUCOSE 484*  --  213* 244* 171* 149* 128*  BUN 121*  --   --  112* 115* 107* 86*  CREATININE 2.67*  --   --  2.78* 2.82* 2.62* 2.25*  CALCIUM 9.3  --   --  8.1* 8.5* 9.0 9.2  MG 2.3  --   --   --   --   --  2.3  PHOS 3.6  --   --   --   --   --  4.6   < > = values in this interval not displayed.   GFR: Estimated Creatinine Clearance: 37.4 mL/min (A) (by C-G formula based on SCr of 2.25 mg/dL (H)). Liver Function Tests: Recent Labs  Lab 07/21/18 0444  AST 49*  ALT 33  ALKPHOS 239*  BILITOT 1.0  PROT 6.7  ALBUMIN 3.3*   Recent Labs  Lab 07/21/18 0444  LIPASE 60*   No results for input(s): AMMONIA in the last 168 hours. Coagulation Profile: No results for input(s): INR, PROTIME in the last 168 hours. Cardiac Enzymes: Recent Labs  Lab 07/21/18 0444 07/21/18 1044  TROPONINI 0.03* 0.03*   BNP (last 3 results) Recent Labs    01/31/18 1101 03/04/18 1025 05/16/18 1100  PROBNP 6,793* 7,996* 3,272*   HbA1C: No results for input(s): HGBA1C in the last 72 hours. CBG: Recent Labs  Lab 07/23/18 1518 07/23/18 1911 07/23/18 2358 07/24/18 0423 07/24/18 0740  GLUCAP 115* 127* 123* 113* 115*   Lipid Profile: No results for input(s): CHOL, HDL, LDLCALC, TRIG, CHOLHDL, LDLDIRECT in the last 72 hours. Thyroid Function Tests: No results for input(s): TSH, T4TOTAL, FREET4, T3FREE, THYROIDAB in the last 72 hours. Anemia Panel: No results for input(s): VITAMINB12, FOLATE, FERRITIN, TIBC, IRON, RETICCTPCT in the last 72 hours. Urine analysis:    Component Value Date/Time   COLORURINE YELLOW 07/21/2018 [place] 07/21/2018 0449   LABSPEC 1.011 07/21/2018 0449   PHURINE 5.0 07/21/2018 0449   GLUCOSEU >=500 (A) 07/21/2018 0449   HGBUR NEGATIVE 07/21/2018 0449   BILIRUBINUR  NEGATIVE 07/21/2018 0449   KETONESUR NEGATIVE 07/21/2018 0449   PROTEINUR NEGATIVE 07/21/2018 0449   UROBILINOGEN 1.0 01/21/2008 1430   NITRITE NEGATIVE 07/21/2018 0449   LEUKOCYTESUR NEGATIVE 07/21/2018 0449     Obdulio Mash M.D. [facility] 07/24/2018, 11:34 AM  Pager: [phone] Between 7am to 7pm - call Pager - [phone]  After 7pm go to [email or web address] - password [email or web address]  Call night coverage person covering after 7pm

## 2018-07-24 NOTE — Evaluation (Signed)
Physical Therapy Evaluation Patient Details Name: Lawrence [name] MRN: [id number] DOB: 01-08-1946 Today's Date: 07/24/2018   History of Present Illness  Lawrence [name] is a 72 y.o. M s/p Exploratory laparotomy and small bowel resection and cecectomy. PMH includes T2DM, COPD, HLD, HTN, CAD s/p CABG, Pacemaker, A-FIb with Eliquis, CHF, OSA on CPAP, GERD, and Diverticulitis. Pt has a wound vac for his abdomen.    Clinical Impression  Pt presents with problems above and deficits below. Per wife, pt is not able to ambulate without a brace for L LE, but pt is able to stand and perform stand/pivots without brace on L LE. Pt performed bed mobility and HEP with Supervision, and sit<>stand transfers with RW and MinG on 3 L O2. VSS. Will continue to follow acutely to support independence, mobility, and safety.      Follow Up Recommendations Home health PT;Supervision/Assistance - 24 hour;Other (comment)(Pt refused SNF)    Equipment Recommendations  None recommended by PT    Recommendations for Other Services OT consult     Precautions / Restrictions Precautions Precautions: Fall;Other (comment) Precaution Comments: Pt has a L LE hairline fracture, and per wife, is not allowed to ambulate without a brace on. However, pt and wife report that standing and performing stand pivots without brace on is okay. Required Braces or Orthoses: Other Brace/Splint Other Brace/Splint: Pt and wife reports they have L LE brace for ambulation Restrictions Weight Bearing Restrictions: No      Mobility  Bed Mobility Overal bed mobility: Needs Assistance Bed Mobility: Sidelying to Sit;Rolling;Sit to Sidelying Rolling: Supervision Sidelying to sit: Supervision     Sit to sidelying: Supervision General bed mobility comments: Pt was able to use bedrails to perform a log rail with Supervision. Pt required increased time and showed some exertion during sidelying to sit. Pt required cues for sequencing.    Transfers Overall transfer level: Needs assistance Equipment used: Rolling walker (2 wheeled) Transfers: Sit to/from Stand Sit to Stand: Min guard         General transfer comment: Pt's Wife reported that wearing brace is not required for standing and stand/pivot activities. PT proceeded. Pt required cues for safe use of RW and hand placement, repeatedly. Pt appeared to have some difficulty standing from low surface of the bed, but did not require physical assist for transfer. Pt required MinG for safety. Performed sit<>stand X2. Pt reports increased fatigue and preferred to go back to bed. Pt reported being in the chair earlier in the day.  Ambulation/Gait             General Gait Details: Did not attempt, as wife reported that due to hairline fracture, pt was not to ambulate without brace. PT deferred ambulation until brace is acquired.   Stairs            Wheelchair Mobility    Modified Rankin (Stroke Patients Only)       Balance Overall balance assessment: Needs assistance Sitting-balance support: No upper extremity supported;Feet supported Sitting balance-Leahy Scale: Fair Sitting balance - Comments: Pt was able to sit at EOB while PT put on gait belt. Pt had some difficulty scooting hips to EOB.    Standing balance support: Bilateral upper extremity supported;Single extremity supported Standing balance-Leahy Scale: Poor Standing balance comment: Pt was able to use urinal while standing with RW and MinG and 3 L O2.  Pertinent Vitals/Pain Pain Assessment: Faces Faces Pain Scale: Hurts little more Pain Location: Abdomen Pain Descriptors / Indicators: Grimacing;Guarding Pain Intervention(s): Limited activity within patient's tolerance;Monitored during session;Repositioned    Home Living Family/patient expects to be discharged to:: Private residence Living Arrangements: Spouse/significant other Available Help at  Discharge: Family;Available 24 hours/day Type of Home: House Home Access: Ramped entrance     Home Layout: One level Home Equipment: Grab bars - toilet;Walker - 2 wheels;Cane - quad;Electric scooter;Wheelchair - manual;Shower seat(Has grab bars for shower, but hasn't installed)      Prior Function Level of Independence: Independent with assistive device(s)         Comments: Pt reports he used RW or scooter for longer distances. Pt also reports using a cane, depending on how fatigued he was.      Hand Dominance   Dominant Hand: Right    Extremity/Trunk Assessment   Upper Extremity Assessment Upper Extremity Assessment: Defer to OT evaluation    Lower Extremity Assessment Lower Extremity Assessment: Generalized weakness;LLE deficits/detail LLE Deficits / Details: Per notes, pt has a L LE hairline fracture.     Cervical / Trunk Assessment Cervical / Trunk Assessment: Normal  Communication   Communication: No difficulties  Cognition Arousal/Alertness: Awake/alert Behavior During Therapy: WFL for tasks assessed/performed Overall Cognitive Status: Within Functional Limits for tasks assessed                                 General Comments: Pt is 4x oriented      General Comments General comments (skin integrity, edema, etc.): Pt's wife present during the session    Exercises General Exercises - Upper Extremity Shoulder Flexion: AROM;Both;10 reps;Supine General Exercises - Lower Extremity Ankle Circles/Pumps: AROM;Both;20 reps;Supine Heel Slides: AROM;10 reps;Supine;Both Straight Leg Raises: AROM;10 reps;Both;Supine   Assessment/Plan    PT Assessment Patient needs continued PT services  PT Problem List Decreased strength;Decreased range of motion;Decreased mobility;Decreased knowledge of use of DME;Pain       PT Treatment Interventions DME instruction;Gait training;Functional mobility training;Therapeutic activities;Therapeutic  exercise;Patient/family education    PT Goals (Current goals can be found in the Care Plan section)  Acute Rehab PT Goals Patient Stated Goal: To "Get out of here" PT Goal Formulation: With patient Time For Goal Achievement: 08/07/18 Potential to Achieve Goals: Good    Frequency Min 3X/week   Barriers to discharge        Co-evaluation               AM-PAC PT "6 Clicks" Daily Activity  Outcome Measure Difficulty turning over in bed (including adjusting bedclothes, sheets and blankets)?: A Little Difficulty moving from lying on back to sitting on the side of the bed? : A Lot Difficulty sitting down on and standing up from a chair with arms (e.g., wheelchair, bedside commode, etc,.)?: Unable Help needed moving to and from a bed to chair (including a wheelchair)?: A Lot Help needed walking in hospital room?: A Lot Help needed climbing 3-5 steps with a railing? : A Lot 6 Click Score: 12    End of Session Equipment Utilized During Treatment: Gait belt;Oxygen Activity Tolerance: Patient tolerated treatment well(Limited by lack of brace) Patient left: in bed;with call bell/phone within reach;with family/visitor present Nurse Communication: Mobility status;Other (comment)(Pt voided) PT Visit Diagnosis: Other abnormalities of gait and mobility (R26.89);Muscle weakness (generalized) (M62.81);Difficulty in walking, not elsewhere classified (R26.2);Pain Pain - part of body: (Abdomen)  Time: 7340-3709 PT Time Calculation (min) (ACUTE ONLY): 30 min   Charges:   PT Evaluation $PT Eval Moderate Complexity: 1 Mod PT Treatments $Therapeutic Activity: 8-22 mins    Elwin Mocha, S-DPT Acute Care Rehab Student [phone]     07/24/2018, 4:11 PM

## 2018-07-25 ENCOUNTER — Encounter (HOSPITAL_COMMUNITY): Payer: Self-pay | Admitting: General Practice

## 2018-07-25 ENCOUNTER — Other Ambulatory Visit: Payer: Self-pay

## 2018-07-25 ENCOUNTER — Inpatient Hospital Stay (HOSPITAL_COMMUNITY): Payer: Medicare Other

## 2018-07-25 LAB — CBC
HEMATOCRIT: 25.1 % — AB (ref 39.0–52.0)
Hemoglobin: 7.8 g/dL — ABNORMAL LOW (ref 13.0–17.0)
MCH: 31.5 pg (ref 26.0–34.0)
MCHC: 31.1 g/dL (ref 30.0–36.0)
MCV: 101.2 fL — AB (ref 78.0–100.0)
PLATELETS: 328 10*3/uL (ref 150–400)
RBC: 2.48 MIL/uL — ABNORMAL LOW (ref 4.22–5.81)
RDW: 15.2 % (ref 11.5–15.5)
WBC: 12.5 10*3/uL — AB (ref 4.0–10.5)

## 2018-07-25 LAB — GLUCOSE, CAPILLARY
GLUCOSE-CAPILLARY: 85 mg/dL (ref 70–99)
Glucose-Capillary: 109 mg/dL — ABNORMAL HIGH (ref 70–99)
Glucose-Capillary: 71 mg/dL (ref 70–99)
Glucose-Capillary: 74 mg/dL (ref 70–99)
Glucose-Capillary: 83 mg/dL (ref 70–99)
Glucose-Capillary: 85 mg/dL (ref 70–99)
Glucose-Capillary: 95 mg/dL (ref 70–99)

## 2018-07-25 LAB — HEMOGLOBIN A1C
Hgb A1c MFr Bld: 7.4 % — ABNORMAL HIGH (ref 4.8–5.6)
Mean Plasma Glucose: 165.68 mg/dL

## 2018-07-25 LAB — BASIC METABOLIC PANEL
Anion gap: 11 (ref 5–15)
BUN: 62 mg/dL — ABNORMAL HIGH (ref 8–23)
CO2: 24 mmol/L (ref 22–32)
CREATININE: 1.82 mg/dL — AB (ref 0.61–1.24)
Calcium: 9 mg/dL (ref 8.9–10.3)
Chloride: 106 mmol/L (ref 98–111)
GFR, EST AFRICAN AMERICAN: 41 mL/min — AB (ref >60)
GFR, EST NON AFRICAN AMERICAN: 35 mL/min — AB (ref >60)
GLUCOSE: 118 mg/dL — AB (ref 70–99)
Potassium: 3.9 mmol/L (ref 3.5–5.1)
Sodium: 141 mmol/L (ref 135–145)

## 2018-07-25 LAB — HEPARIN LEVEL (UNFRACTIONATED): Heparin Unfractionated: 0.33 IU/mL (ref 0.30–0.70)

## 2018-07-25 MED ORDER — DEXTROSE-NACL 5-0.9 % IV SOLN
INTRAVENOUS | Status: DC
  Administered 2018-07-26 – 2018-07-28 (×4): via INTRAVENOUS

## 2018-07-25 MED ORDER — GI COCKTAIL ~~LOC~~
30.0000 mL | Freq: Three times a day (TID) | ORAL | Status: DC | PRN
  Administered 2018-07-25 – 2018-07-28 (×4): 30 mL via ORAL
  Filled 2018-07-25 (×5): qty 30

## 2018-07-25 MED ORDER — PANTOPRAZOLE SODIUM 40 MG IV SOLR
40.0000 mg | Freq: Two times a day (BID) | INTRAVENOUS | Status: DC
  Administered 2018-07-25 – 2018-07-31 (×13): 40 mg via INTRAVENOUS
  Filled 2018-07-25 (×13): qty 40

## 2018-07-25 MED ORDER — HEPARIN (PORCINE) IN NACL 100-0.45 UNIT/ML-% IJ SOLN
1650.0000 [IU]/h | INTRAMUSCULAR | Status: DC
  Administered 2018-07-25 – 2018-07-27 (×3): 1150 [IU]/h via INTRAVENOUS
  Administered 2018-07-28 – 2018-07-30 (×4): 1650 [IU]/h via INTRAVENOUS
  Filled 2018-07-25 (×9): qty 250

## 2018-07-25 NOTE — Progress Notes (Signed)
Went into pt's room found him sitting up in bed reports he has indigestion and wants something to help. Page sent to [facility] cover no response at this time.

## 2018-07-25 NOTE — Care Management Note (Addendum)
Case Management Note  Patient Details  Name: Lawrence [name] MRN: [id number] Date of Birth: [birth year]  Subjective/Objective:                    Action/Plan:  1620 Angela with [employer] called to confirm DOB ( [birth year]) . Confirmed patient's DOB with patient's wife . Returned [employer] ,spoke with Camellia at [facility]. VAC will be released and delivered to patient's room this weekend. Discussed discharge planning with patient's wife [name] at bedside.  Explained NCM will complete paperwork for home VAC if needed. [facility] will deliver home VAC to patient's hospital room once [facility] receives insurance authorization from insurance. [employer] will explain any co pays / cost with patient. If patient discharged with [facility] hospital nurse will apply home [facility] and home health RN will change [facility] dressing Monday Wednesday Friday. If VAC discontinued patient may go home with wet to dry dressing. Wife would be shown how to do wet to dry dressing changes because HHRN would not be there everytime dressing needs to be changed. Wife voiced understanding and stated she already knows how to do wet to dry dressing changes.  She wants [place] through [facility] . Once receive orders with wound care instructions will call and fax.   Home VAC application completed and faxed to [facility].  Expected Discharge Date:                  Expected Discharge Plan:  [place]  In-House Referral:     Discharge planning Services  CM Consult  Post Acute Care Choice:  Home Health, Durable Medical Equipment Choice offered to:  Spouse, Patient  DME Arranged:  Vac DME Agency:  [facility]  HH Arranged:    [place] Agency:  [facility]  Status of Service:  In process, will continue to follow  If discussed at Long Length of Stay Meetings, dates discussed:    Additional Comments:  Marilu Favre, RN 07/25/2018, 12:04 PM

## 2018-07-25 NOTE — Progress Notes (Signed)
ANTICOAGULATION CONSULT NOTE - Initial Consult  Pharmacy Consult for Heparin Indication: atrial fibrillation  Allergies  Allergen Reactions  . Bactrim [Sulfamethoxazole-Trimethoprim] Other (See Comments)    Heart to sleep, renal failure, potassium level spiked    Patient Measurements: Height: 6' (182.9 cm) Weight: 238 lb 15.7 oz (108.4 kg) IBW/kg (Calculated) : 77.6 Heparin Dosing Weight: 99.2 kg  Vital Signs: Temp: 98 F (36.7 C) (08/02 0400) Temp Source: Oral (08/02 0400) BP: 152/58 (08/02 0400) Pulse Rate: 65 (08/02 0400)  Labs: Recent Labs    07/23/18 0433 07/24/18 0537 07/24/18 1125 07/25/18 0759  HGB 7.5*  --  7.8* 7.8*  HCT 24.2*  --  25.5* 25.1*  PLT 292  --  344 328  CREATININE 2.62* 2.25*  --  1.82*    Estimated Creatinine Clearance: 46.7 mL/min (A) (by C-G formula based on SCr of 1.82 mg/dL (H)).   Medical History: Past Medical History:  Diagnosis Date  . Anemia   . Chronic airway obstruction, not elsewhere classified   . Chronic ischemic heart disease, unspecified   . Coronary artery disease   . Diabetes mellitus    type II, neuropathy,   . Essential hypertension, benign   . Hyperlipidemia, mixed   . Nodule of kidney    incidentally found on CT abdomen 11/2012.  Pending Urology evaluation.   . Nontoxic multinodular goiter   . Obesity   . Other testicular hypofunction   . PUD (peptic ulcer disease)   . Renal insufficiency, mild    hx of  . Sleep apnea    uses cpap    Assessment: 72 year old male who has history of AVMs and on Eliquis prior to admission for atrial fibrillation was admitted 7/29 from [facility] ED due to GIB s/p reversal at [place] and transferred to [facility]. Patient was found to have small bowel ischemia with pneumatosis s/p ex lap with small bowel resection and cecectomy on 07/21/18. Anticoagulation has been held. Surgery ok'd re-initiating IV Heparin for now.   Hgb 7.8, Platelets 328.  No active bleeding.  SCr trending down  at 1.82. Last dose of Eliquis was 07/20/18  Goal of Therapy:  Heparin level 0.3-0.7 units/ml Monitor platelets by anticoagulation protocol: Yes   Plan:  Due to high risk of re-bleeding, will start IV Heparin conservatively at a rate of 1150 units/hr and without a bolus.  Will check a Heparin level in 6 hours.  Daily heparin level and CBC while on therapy.   Sloan Leiter, PharmD, BCPS, BCCCP Clinical Pharmacist Clinical phone 07/25/2018 until 3:30PM [phone] Please refer to  07/25/2018,1:07 PM

## 2018-07-25 NOTE — Progress Notes (Signed)
[facility] Progress Note  4 Days Post-Op  Subjective: CC:  C/o belching and heartburn. Remains obstipated. Saw PT yesterday but needs to bring his leg brace for recent fracture from home for next session.  Objective: Vital signs in last 24 hours: Temp:  [97.5 F (36.4 C)-98 F (36.7 C)] 98 F (36.7 C) (08/02 0400) Pulse Rate:  [65-75] 65 (08/02 0400) Resp:  [11-16] 11 (08/02 0400) BP: (140-152)/(58-84) 152/58 (08/02 0400) SpO2:  [100 %] 100 % (08/02 0353) Weight:  [108.4 kg (238 lb 15.7 oz)] 108.4 kg (238 lb 15.7 oz) (08/02 0353) Last BM Date: 07/20/18  Intake/Output from previous day: 08/01 0701 - 08/02 0700 In: 130 [P.O.:120; I.V.:10] Out: 1425 [Urine:1425] Intake/Output this shift: No intake/output data recorded.  PE: Gen:  Alert, NAD Pulm:  Normal effort Abd: Soft, approp tender, midline incision as below, 18 x 6 x 3-4 cm and granulating appropriately   Skin: warm and dry, no rashes  Psych: A&Ox3   Lab Results:  Recent Labs    07/24/18 1125 07/25/18 0759  WBC 14.6* 12.5*  HGB 7.8* 7.8*  HCT 25.5* 25.1*  PLT 344 328   BMET Recent Labs    07/24/18 0537 07/25/18 0759  NA 143 141  K 4.0 3.9  CL 107 106  CO2 23 24  GLUCOSE 128* 118*  BUN 86* 62*  CREATININE 2.25* 1.82*  CALCIUM 9.2 9.0   PT/INR No results for input(s): LABPROT, INR in the last 72 hours. CMP     Component Value Date/Time   NA 141 07/25/2018 0759   NA 137 05/16/2018 1100   NA 139 03/29/2015 0926   K 3.9 07/25/2018 0759   K 4.4 03/29/2015 0926   CL 106 07/25/2018 0759   CL 108 (H) 06/09/2013 1218   CO2 24 07/25/2018 0759   CO2 24 03/29/2015 0926   GLUCOSE 118 (H) 07/25/2018 0759   GLUCOSE 239 (H) 03/29/2015 0926   GLUCOSE 235 (H) 06/09/2013 1218   BUN 62 (H) 07/25/2018 0759   BUN 59 (H) 05/16/2018 1100   BUN 25.8 03/29/2015 0926   CREATININE 1.82 (H) 07/25/2018 0759   CREATININE 1.2 03/29/2015 0926   CALCIUM 9.0 07/25/2018 0759   CALCIUM 8.9 03/29/2015 0926     PROT 6.7 07/21/2018 0444   PROT 6.6 12/18/2017 1443   PROT 6.5 03/29/2015 0926   ALBUMIN 3.3 (L) 07/21/2018 0444   ALBUMIN 4.1 12/18/2017 1443   ALBUMIN 3.5 03/29/2015 0926   AST 49 (H) 07/21/2018 0444   AST 20 03/29/2015 0926   ALT 33 07/21/2018 0444   ALT 17 03/29/2015 0926   ALKPHOS 239 (H) 07/21/2018 0444   ALKPHOS 136 03/29/2015 0926   BILITOT 1.0 07/21/2018 0444   BILITOT 0.8 12/18/2017 1443   BILITOT 0.63 03/29/2015 0926   GFRNONAA 35 (L) 07/25/2018 0759   GFRAA 41 (L) 07/25/2018 0759   Lipase     Component Value Date/Time   LIPASE 60 (H) 07/21/2018 0444   Studies/Results: No results found.  Anti-infectives: Anti-infectives (From admission, onward)   Start     Dose/Rate Route Frequency Ordered Stop   07/21/18 1315  cefoTEtan (CEFOTAN) 2 g in sodium chloride 0.9 % 100 mL IVPB     2 g 200 mL/hr over 30 Minutes Intravenous Every 12 hours 07/21/18 1309     07/21/18 1315  cefoTEtan (CEFOTAN) 2 g in sodium chloride 0.9 % 100 mL IVPB     2 g 200 mL/hr over 30 Minutes Intravenous  To Surgery 07/21/18 1312 07/21/18 1210     Assessment/Plan HTN HLD T2DM COPD Hx of CAD S/p pacemaker placement A.Fib on eliquis - last dose yesterday, reversal agent given, s/p 1 unit PRBC Chronic Diastolic CHF/Chronic ischemic heart disease OSA on CPAP Hx of colonic polyps and moderate sigmoid diverticulosis GERD Non-toxic multinodular goiter AKI on CKD stage III - Cr 2.82, continue IVF, limit nephrotoxic agents Obesity  Small bowel ischemia with pneumatosis S/p exploratory laparotomy with small bowel resection and cecectomy 07/21/18 Dr. Brantley Stage -POD#4 - ZMC80.2, trending down  - NGT removed 7/31, post-op ileus - continue NPO + ice/sips with meds and await further bowel function - VAC M/W/F - OOB, mobilize with therapies   FEN: ice chips, sips w meds  ID: cefotetan 7/29 >> VTE: SCD's, chemical VTE has been held 2/2 anemia (hgb 7.8) Foley: none    LOS: 4 days     Jill Alexanders , [facility] Surgery 07/25/2018, 9:55 AM Pager: [phone] Consults: [phone] Mon-Fri 7:00 am-4:30 pm Sat-Sun 7:00 am-11:30 am

## 2018-07-25 NOTE — Progress Notes (Signed)
Results for [name], [name] (MRN [id number]) as of 07/25/2018 10:08  Ref. Range 07/24/2018 16:00 07/24/2018 20:03 07/25/2018 00:02 07/25/2018 04:05 07/25/2018 07:56  Glucose-Capillary Latest Ref Range: 70 - 99 mg/dL 140 (H) 95 85 95 109 (H)  Noted that blood sugars have been less than 100 mg/dl. Recommend decreasing Levemir to 10 units BID if blood sugars continue to be less than 100 mg/dl.  Continue Novolog correction scale as ordered.   Harvel Ricks RN BSN CDE Diabetes Coordinator Pager: [phone]  8am-5pm

## 2018-07-25 NOTE — Progress Notes (Signed)
Pt wife at nurse station reports patient has not been taken care of all night and wants him to have pain meds and something for indigestion. This Probation officer alerted pt's wife I have sent message to the MD and awaiting response. Will administer pain meds per MD orders. Will continue to monitor.

## 2018-07-25 NOTE — Progress Notes (Signed)
ANTICOAGULATION CONSULT NOTE - Initial Consult  Pharmacy Consult for Heparin Indication: atrial fibrillation  Allergies  Allergen Reactions  . Bactrim [Sulfamethoxazole-Trimethoprim] Other (See Comments)    Heart to sleep, renal failure, potassium level spiked    Patient Measurements: Height: 6' (182.9 cm) Weight: 238 lb 15.7 oz (108.4 kg) IBW/kg (Calculated) : 77.6 Heparin Dosing Weight: 99.2 kg  Vital Signs: Temp: 98 F (36.7 C) (08/02 2025) Temp Source: Oral (08/02 2025) BP: 137/60 (08/02 2025) Pulse Rate: 63 (08/02 2025)  Labs: Recent Labs    07/23/18 0433 07/24/18 0537 07/24/18 1125 07/25/18 0759 07/25/18 2039  HGB 7.5*  --  7.8* 7.8*  --   HCT 24.2*  --  25.5* 25.1*  --   PLT 292  --  344 328  --   HEPARINUNFRC  --   --   --   --  0.33  CREATININE 2.62* 2.25*  --  1.82*  --     Estimated Creatinine Clearance: 46.7 mL/min (A) (by C-G formula based on SCr of 1.82 mg/dL (H)).   Medical History: Past Medical History:  Diagnosis Date  . Anemia   . Chronic airway obstruction, not elsewhere classified   . Chronic ischemic heart disease, unspecified   . Coronary artery disease   . Diabetes mellitus    type II, neuropathy,   . Essential hypertension, benign   . Hyperlipidemia, mixed   . Nodule of kidney    incidentally found on CT abdomen 11/2012.  Pending Urology evaluation.   . Nontoxic multinodular goiter   . Obesity   . Other testicular hypofunction   . PUD (peptic ulcer disease)   . Renal insufficiency, mild    hx of  . Sleep apnea    uses cpap    Assessment: 72 year old male who has history of AVMs and on Eliquis prior to admission for atrial fibrillation was admitted 7/29 from [facility] ED due to GIB s/p reversal at [place] and transferred to [facility]. Patient was found to have small bowel ischemia with pneumatosis s/p ex lap with small bowel resection and cecectomy on 07/21/18. Anticoagulation has been held. Surgery ok'd re-initiating IV Heparin for  now.   Hgb 7.8, Platelets 328.  No active bleeding.  SCr trending down at 1.82. Last dose of Eliquis was 07/20/18  PM: heparin level came back therapeutic.   Goal of Therapy:  Heparin level 0.3-0.7 units/ml Monitor platelets by anticoagulation protocol: Yes   Plan:  Cont Heparin 1150 units/hr Confirm level in AM  Daily heparin level and CBC while on therapy.   Onnie Boer, PharmD, [place], AAHIVP, CPP Infectious Disease Pharmacist Pager: [phone] 07/25/2018 9:37 PM

## 2018-07-25 NOTE — Progress Notes (Signed)
Physical Therapy Treatment Patient Details Name: Lawrence [name] MRN: [id number] DOB: 12-29-1945 Today's Date: 07/25/2018    History of Present Illness Lawrence [name] is a 72 y.o. M s/p Exploratory laparotomy and small bowel resection and cecectomy. PMH includes T2DM, COPD, HLD, HTN, CAD s/p CABG, Pacemaker, A-FIb with Eliquis, CHF, OSA on CPAP, GERD, and Diverticulitis. Pt has a wound vac for his abdomen.      PT Comments    Pt is progressing toward PT goals, incr amb today (wife states he does not need LLE  brace for this distance now--previous PT session they reported he could nto walk without it--supposed hairline femur fx 12 wks ago per pt wife--no WBing restrictions); pt will benefit from continued PT in acute setting  Follow Up Recommendations  Home health PT;Supervision for mobility/OOB     Equipment Recommendations  None recommended by PT    Recommendations for Other Services       Precautions / Restrictions Precautions Precautions: Other (comment) Precaution Comments: Pt has a L LE hairline fracture, and per wife, previous notes wife stated pt not allowed to  amb without brace; today pt and wife state he is allowed shorter distance amb without brace, longer or uneven distances he needs brace --they report this be Dr. Sid Falcon recomendation Other Brace/Splint: VAC/ abd site  Restrictions Weight Bearing Restrictions: No    Mobility  Bed Mobility Overal bed mobility: Needs Assistance Bed Mobility: Sidelying to Sit;Rolling;Sit to Sidelying Rolling: Supervision Sidelying to sit: Supervision     Sit to sidelying: Supervision General bed mobility comments: pt able to perform without assist, uses rail  Transfers Overall transfer level: Needs assistance Equipment used: Rolling walker (2 wheeled) Transfers: Sit to/from Stand Sit to Stand: Min guard         General transfer comment: cues for hand placement, min/guard for safety with transition to  RW  Ambulation/Gait Ambulation/Gait assistance: Min guard Gait Distance (Feet): 40 Feet(40' x2 and 15' more) Assistive device: Rolling walker (2 wheeled) Gait Pattern/deviations: Step-through pattern;Decreased step length - right;Decreased step length - left     General Gait Details: cues for RW position, step length, breathing and trunk extension; seated rest after 40', 2/4 DOE SpO2>94% on RA during activity; replaced O2 at rest   Stairs             Wheelchair Mobility    Modified Rankin (Stroke Patients Only)       Balance   Sitting-balance support: No upper extremity supported;Feet supported Sitting balance-Leahy Scale: Fair     Standing balance support: During functional activity;Single extremity supported Standing balance-Leahy Scale: Fair Standing balance comment: able to use bathroom while standing without UE suppoprt, no LOB                            Cognition Arousal/Alertness: Awake/alert Behavior During Therapy: WFL for tasks assessed/performed Overall Cognitive Status: Within Functional Limits for tasks assessed                                 General Comments: lots of lengthy complaints about ICU staff, night shift and pt being transferred      Exercises      General Comments General comments (skin integrity, edema, etc.): wife present throughout session      Pertinent Vitals/Pain Pain Assessment: Faces Faces Pain Scale: Hurts even more Pain Location: abdomen, indigestion Pain Descriptors /  Indicators: Grimacing Pain Intervention(s): Monitored during session    Home Living                      Prior Function            PT Goals (current goals can now be found in the care plan section) Acute Rehab PT Goals Patient Stated Goal: To "Get out of here" PT Goal Formulation: With patient Time For Goal Achievement: 08/07/18 Potential to Achieve Goals: Good Progress towards PT goals: Progressing toward  goals    Frequency    Min 3X/week      PT Plan Current plan remains appropriate    Co-evaluation              AM-PAC PT "6 Clicks" Daily Activity  Outcome Measure  Difficulty turning over in bed (including adjusting bedclothes, sheets and blankets)?: A Little Difficulty moving from lying on back to sitting on the side of the bed? : A Little Difficulty sitting down on and standing up from a chair with arms (e.g., wheelchair, bedside commode, etc,.)?: Unable Help needed moving to and from a bed to chair (including a wheelchair)?: A Little Help needed walking in hospital room?: A Little Help needed climbing 3-5 steps with a railing? : A Lot 6 Click Score: 15    End of Session Equipment Utilized During Treatment: Gait belt Activity Tolerance: Patient tolerated treatment well Patient left: in bed;with call bell/phone within reach;with family/visitor present Nurse Communication: Mobility status PT Visit Diagnosis: Other abnormalities of gait and mobility (R26.89);Muscle weakness (generalized) (M62.81);Difficulty in walking, not elsewhere classified (R26.2)     Time: 3976-7341 PT Time Calculation (min) (ACUTE ONLY): 38 min  Charges:  $Gait Training: 23-37 mins $Therapeutic Activity: 8-22 mins                     Kenyon Ana, PT Pager: [phone] 07/25/2018    [facility] 07/25/2018, 11:45 AM

## 2018-07-25 NOTE — Progress Notes (Signed)
[facility]                                                                              Patient Demographics  Lawrence [name], is a 72 y.o. male, DOB - [birth year], UEK:[id number]  Admit date - 07/21/2018   Admitting Physician Collene Gobble, MD  Outpatient Primary MD for the patient is [name], MD  Outpatient specialists:   LOS - 4  days   Medical records reviewed and are as summarized below:    No chief complaint on file.      Brief summary   Patient was admitted by critical care service on 07/21/2018, per admission note by Ms. Eubanks 72 year old male with PMH of COPD, DM, HLD, HTN, CAD, S/P Pacemaker, A.Fib on Eliquis, Diastolic HF, OSA on CPAP at HS, H/O AVM Colonic Polys and Moderate Sigmoid Diverticulosis, GERD.  Presents to [facility] ED on 7/29 with Abdominal Pain, Nausea, and reportedly two days of Hematochezia in which appeared black/tarry. Hemoglobin 8.8. Given 1 unit RBC at OSH. Eliquis reversed with Anti-Inhibitor Coagulant Complex. Patient transferred to [facility] for Further Evaluation.  On arrival to ICU patient is alert and oriented with systolic 150. On insulin gtt. Complaining of abdominal tenderness and nausea.  Patient was admitted to ICU, found to have small bowel ischemia/pneumatosis, hematochezia, underwent exploratory laparotomy with small bowel resection and seek cecectomy on 07/21/2018 (Dr Brantley Stage).  He was extubated on 7/30. Patient was transferred to hospitalist service on 8/1 and [facility] assumed care on 8/1.    Assessment & Plan    Principal problem Small bowel ischemia/pneumatosis with hematochezia -Patient was seen by GI and general surgery. -Patient underwent exploratory laparotomy with small bowel resection and cecectomy on 07/21/2018, postop day # 4 -Status post wound VAC on 7/31, NGT removed on 7/31, surgery following, management per general surgery -Today complaining of " heartburn", placed on GI cocktail 3 times daily  as needed, increase Protonix to every 12 hours  Active problems Acute back pain/fall - s/p falling from the bed on 8/2 night while transferring from the ICU.  Back pain improving today -X-ray of the thoracic and lumbar spine pending -PT eval recommended home health PT, continue Lidoderm patch, pain control   Acute blood loss anemia, iron deficiency -Likely due to #1, anticoagulation currently on hold -Baseline hemoglobin 10-11, hemoglobin 7.5 on 7/31 -Hemoglobin 7.8, follow H&H, may need transfusion if less than 7.5   Diabetes mellitus, type II -Diabetic coordinator following,  -CBG stable, continue Lantus, sliding scale insulin -Hemoglobin A1c 7.4   History of diastolic CHF, CAD -Currently compensated, follow I's and O's closely.  Net +1.3 L  - PTA patient was on Lasix and metolazone, currently n.p.o. -Once patient on diet, will start low-dose Lasix to avoid volume overload -Currently no chest pain or shortness of breath  Paroxysmal atrial fibrillation, pacemaker -Anticoagulation was held due to #1 -Cleared by general surgery to start anti-correlation with IV heparin drip today   Code Status: Full code DVT Prophylaxis: SCDs Family Communication: Discussed in detail with the patient, all imaging results, lab results explained to the patient and wife  in the room.   Disposition Plan:  Time Spent in minutes: 25  Procedures:   Procedure: Exploratory laparotomy small bowel resection and cecectomy     Consultants:   General surgery GI Patient was admitted by CCM  Antimicrobials:   Cefotetan   Medications  Scheduled Meds: . acetaminophen  650 mg Oral Q6H  . chlorhexidine  15 mL Mouth Rinse BID  . Chlorhexidine Gluconate Cloth  6 each Topical Daily  . insulin aspart  0-20 Units Subcutaneous Q4H  . insulin detemir  12 Units Subcutaneous Q12H  . lidocaine  1 patch Transdermal Q24H  . mouth rinse  15 mL Mouth Rinse q12n4p  . pantoprazole  40 mg Intravenous Q12H    . sodium chloride flush  10-40 mL Intracatheter Q12H   Continuous Infusions: . sodium chloride    . sodium chloride 75 mL/hr at 07/25/18 1220  . sodium chloride Stopped (07/22/18 1744)   PRN Meds:.sodium chloride, gi cocktail, HYDROmorphone (DILAUDID) injection, ipratropium-albuterol, methocarbamol, ondansetron (ZOFRAN) IV, oxyCODONE, sodium chloride flush   Antibiotics   Anti-infectives (From admission, onward)   Start     Dose/Rate Route Frequency Ordered Stop   07/21/18 1315  cefoTEtan (CEFOTAN) 2 g in sodium chloride 0.9 % 100 mL IVPB  Status:  Discontinued     2 g 200 mL/hr over 30 Minutes Intravenous Every 12 hours 07/21/18 1309 07/25/18 1220   07/21/18 1315  cefoTEtan (CEFOTAN) 2 g in sodium chloride 0.9 % 100 mL IVPB     2 g 200 mL/hr over 30 Minutes Intravenous To Surgery 07/21/18 1312 07/21/18 1210        Subjective:   Breslin Hemann was seen and examined today.  Complaining of heartburn and belching otherwise stable.  Back pain is improving.   Denies any fevers, chills, nausea and vomiting.      Objective:   Vitals:   07/24/18 2004 07/25/18 0000 07/25/18 0353 07/25/18 0400  BP: (!) 145/67 140/84  (!) 152/58  Pulse: 75 71  65  Resp: 16 14  11   Temp: (!) 97.5 F (36.4 C) 97.9 F (36.6 C)  98 F (36.7 C)  TempSrc: Oral Oral  Oral  SpO2: 100% 100% 100%   Weight:   108.4 kg (238 lb 15.7 oz)   Height:        Intake/Output Summary (Last 24 hours) at 07/25/2018 1300 Last data filed at 07/25/2018 0940 Gross per 24 hour  Intake 10 ml  Output 1300 ml  Net -1290 ml     Wt Readings from Last 3 Encounters:  07/25/18 108.4 kg (238 lb 15.7 oz)  07/16/18 104.3 kg (230 lb)  07/08/18 101.2 kg (223 lb)     Exam  General: Alert and oriented x 3, NAD Eyes:  HEENT:   Cardiovascular: S1 S2 auscultated,  Regular rate and rhythm. No pedal edema b/l Respiratory: Clear to auscultation bilaterally, no wheezing, rales or rhonchi Gastrointestinal: Soft, appropriately  tender, wound VAC+ Ext: no pedal edema bilaterally Neuro: no new deficits Musculoskeletal: No digital cyanosis, clubbing Skin: No rashes Psych: Normal affect and demeanor, alert and oriented x3      Data Reviewed:  I have personally reviewed following labs and imaging studies  Micro Results Recent Results (from the past 240 hour(s))  Culture, blood (routine x 2)     Status: None (Preliminary result)   Collection Time: 07/21/18  4:35 AM  Result Value Ref Range Status   Specimen Description BLOOD LEFT ARM  Final   Special  Requests   Final    BOTTLES DRAWN AEROBIC ONLY Blood Culture adequate volume   Culture   Final    NO GROWTH 4 DAYS Performed at [facility], [place] [address] [place], [place] [address]    Report Status PENDING  Incomplete  Culture, blood (routine x 2)     Status: None (Preliminary result)   Collection Time: 07/21/18  4:40 AM  Result Value Ref Range Status   Specimen Description BLOOD LEFT ARM  Final   Special Requests   Final    BOTTLES DRAWN AEROBIC ONLY Blood Culture adequate volume   Culture   Final    NO GROWTH 4 DAYS Performed at [facility], [address], [place], [place] [address]    Report Status PENDING  Incomplete  MRSA PCR Screening     Status: None   Collection Time: 07/21/18  4:49 AM  Result Value Ref Range Status   MRSA by PCR NEGATIVE NEGATIVE Final    Comment:        The GeneXpert MRSA Assay (FDA approved for NASAL specimens only), is one component of a comprehensive MRSA colonization surveillance program. It is not intended to diagnose MRSA infection nor to guide or monitor treatment for MRSA infections. Performed at [facility], [place] [address] [place], [place] [address]   Surgical pcr screen     Status: None   Collection Time: 07/21/18 10:37 AM  Result Value Ref Range Status   MRSA, PCR NEGATIVE NEGATIVE Final   Staphylococcus aureus NEGATIVE NEGATIVE Final    Comment: (NOTE) The Xpert SA Assay (FDA  approved for NASAL specimens in patients 10 years of age and older), is one component of a comprehensive surveillance program. It is not intended to diagnose infection nor to guide or monitor treatment. Performed at [facility], [place] [address], [place],  [address]     Radiology Reports Ct Abdomen Pelvis Wo Contrast  Result Date: 07/21/2018 CLINICAL DATA:  Abdominal pain. Vomiting. Elevated white blood cell count and lactic acid EXAM: CT ABDOMEN AND PELVIS WITHOUT CONTRAST TECHNIQUE: Multidetector CT imaging of the abdomen and pelvis was performed following the standard protocol without IV contrast. COMPARISON:  Plain film [id number] FINDINGS: Lower chest:  Lung bases are clear. Hepatobiliary: Liver is mildly nodular. Postcholecystectomy. No biliary duct dilatation. Pancreas: Normal pancreatic parenchymal intensity. No ductal dilatation or inflammation. Spleen: Normal spleen. Adrenals/urinary tract: Adrenal glands and kidneys are normal. Normal bladder. Stomach/Bowel: Stomach is normal. Duodenum is normal. In the mid small bowel there is stasis with fecalization ofinternal contents. There is pneumatosis within the wall of the small bowel centrally in the upper abdomen (image 36/3). Additionally, there is portal venous gas veins of the mesentery of the upper small bowel as seen in LEFT upper quadrant (image 44/3). Findings are consistent with small bowel ischemia. No obstructing lesion identified. Mild caliber change from proximal to distal small bowel. Vascular/Lymphatic: Venous mesenteric gas. No gas within the portal veins. Heavy calcification of the abdominal aorta. Calcifications at the ostia of the SMA and celiac trunk. Musculoskeletal: No aggressive osseous lesion Reproductive: Prostate normal. Small amount free fluid the pelvis. IMPRESSION: 1. Small bowel ischemia with pneumatosis within loops of bowel in the LEFT upper quadrant and gas within mesenteric veins. 2. No portal venous gas in  the liver. 3. No evidence of high-grade bowel obstruction. There is some fecalization of enteric contents of the proximal small bowel. 4. Aortic Atherosclerosis (ICD10-I70.0). Calcifications about  the ostia of the SMA and celiac trunk, patency cannot be evaluated on noncontrast exam. Critical Value/emergent results were called by telephone at the time of interpretation on 07/21/2018 at 10:13 am to Dr. [place] Cellar , who verbally acknowledged these results. Electronically Signed   By: Suzy Bouchard M.D.   On: 07/21/2018 10:17   Dg Thoracic Spine W/swimmers  Result Date: 07/25/2018 CLINICAL DATA:  Upper back pain. EXAM: THORACIC SPINE - 3 VIEWS COMPARISON:  CT scan of April 10, 2018. FINDINGS: No fracture or spondylolisthesis is noted. Anterior osteophyte formation is seen involving the middle and lower thoracic spine. IMPRESSION: Multilevel degenerative disc disease. No acute abnormality seen in the thoracic spine. Electronically Signed   By: Marijo Conception, M.D.   On: 07/25/2018 12:19   Dg Lumbar Spine Complete  Result Date: 07/25/2018 CLINICAL DATA:  Lower back pain. EXAM: LUMBAR SPINE - COMPLETE 4+ VIEW COMPARISON:  CT scan of July 21, 2018. FINDINGS: No fracture or spondylolisthesis is noted. Mild degenerative disc disease is noted at L1-2, L2-3 and L4-5. Moderate degenerative disc disease is noted at L3-4. Atherosclerosis of abdominal aorta is noted. IMPRESSION: Multilevel degenerative disc disease. No acute abnormality seen in the lumbar spine. Aortic Atherosclerosis (ICD10-I70.0). Electronically Signed   By: Marijo Conception, M.D.   On: 07/25/2018 12:16   Dg Chest Port 1 View  Result Date: 07/22/2018 CLINICAL DATA:  Small bowel ischemia, shortness of breath, coronary artery disease, chronic ischemic heart disease, essential benign hypertension, type II diabetes mellitus, former smoker EXAM: PORTABLE CHEST 1 VIEW COMPARISON:  Portable exam 0502 hours compared to 07/21/2018 FINDINGS: Nasogastric  tube extends into abdomen. RIGHT jugular central venous catheter tip projects over SVC near cavoatrial junction. LEFT subclavian sequential pacemaker leads project over RIGHT atrium and RIGHT ventricle, unchanged. Upper normal size of cardiac silhouette post CABG. Mediastinal contours and pulmonary vascularity normal. Improved interstitial infiltrates question edema. No pleural effusion or pneumothorax. Bones demineralized. IMPRESSION: Probable mild pulmonary edema, slightly improved. Electronically Signed   By: Lavonia Dana M.D.   On: 07/22/2018 09:04   Dg Chest Port 1 View  Result Date: 07/21/2018 CLINICAL DATA:  Central line placement EXAM: PORTABLE CHEST 1 VIEW COMPARISON:  07/21/2018 at 12:26 a.m. FINDINGS: Right IJ central venous catheter tip is at the cavoatrial junction. Left chest wall pacemaker is unchanged. Sequelae of median sternotomy and CABG. Nasogastric tube side port is at the gastroesophageal junction. Mild pulmonary edema. IMPRESSION: 1. Right IJ CVC tip at the cavoatrial junction.  No pneumothorax. 2. Nasogastric tube side port at the gastroesophageal junction. Recommend advancing by 5-7 cm. 3. Mild pulmonary edema. Electronically Signed   By: Ulyses Jarred M.D.   On: 07/21/2018 15:45   Dg Abd Portable 1v  Result Date: 07/21/2018 CLINICAL DATA:  Abdominal pain vomiting tonight. EXAM: PORTABLE ABDOMEN - 1 VIEW COMPARISON:  CT abdomen and pelvis December 01, 2012 FINDINGS: Moderate amount of retained large bowel stool. Moderate amount of retained large bowel stool. A few loops of gas distended small large bowel. Surgical clips in the included right abdomen compatible with cholecystectomy. Phleboliths in the pelvis. Mild vascular calcifications. Mild dextroscoliosis. Ankylosis of the sacroiliac joints. Osteopenia. IMPRESSION: Moderate amount of retained large bowel stool with mild probable ileus, less likely early small bowel obstruction. Electronically Signed   By: Elon Alas M.D.    On: 07/21/2018 06:14    Lab Data:  CBC: Recent Labs  Lab 07/21/18 0444  07/21/18 1837 07/22/18 9371 07/23/18 0433 07/24/18  1125 07/25/18 0759  WBC 24.2*   < > 31.7* 21.3* 14.5* 14.6* 12.5*  NEUTROABS 22.4*  --   --   --   --   --   --   HGB 10.4*   < > 8.7* 8.0* 7.5* 7.8* 7.8*  HCT 31.4*   < > 26.9* 24.9* 24.2* 25.5* 25.1*  MCV 95.7   < > 97.1 98.4 101.7* 100.4* 101.2*  PLT 364   < > 377 306 292 344 328   < > = values in this interval not displayed.   Basic Metabolic Panel: Recent Labs  Lab 07/21/18 0444  07/21/18 1837 07/22/18 0418 07/23/18 0433 07/24/18 0537 07/25/18 0759  NA 133*   < > 135 137 140 143 141  K 3.3*   < > 4.0 4.1 3.8 4.0 3.9  CL 91*  --  102 103 102 107 106  CO2 24  --  22 22 23 23 24   GLUCOSE 484*   < > 244* 171* 149* 128* 118*  BUN 121*  --  112* 115* 107* 86* 62*  CREATININE 2.67*  --  2.78* 2.82* 2.62* 2.25* 1.82*  CALCIUM 9.3  --  8.1* 8.5* 9.0 9.2 9.0  MG 2.3  --   --   --   --  2.3  --   PHOS 3.6  --   --   --   --  4.6  --    < > = values in this interval not displayed.   GFR: Estimated Creatinine Clearance: 46.7 mL/min (A) (by C-G formula based on SCr of 1.82 mg/dL (H)). Liver Function Tests: Recent Labs  Lab 07/21/18 0444  AST 49*  ALT 33  ALKPHOS 239*  BILITOT 1.0  PROT 6.7  ALBUMIN 3.3*   Recent Labs  Lab 07/21/18 0444  LIPASE 60*   No results for input(s): AMMONIA in the last 168 hours. Coagulation Profile: No results for input(s): INR, PROTIME in the last 168 hours. Cardiac Enzymes: Recent Labs  Lab 07/21/18 0444 07/21/18 1044  TROPONINI 0.03* 0.03*   BNP (last 3 results) Recent Labs    01/31/18 1101 03/04/18 1025 05/16/18 1100  PROBNP 6,793* 7,996* 3,272*   HbA1C: Recent Labs    07/25/18 0759  HGBA1C 7.4*   CBG: Recent Labs  Lab 07/24/18 2003 07/25/18 0002 07/25/18 0405 07/25/18 0756 07/25/18 1222  GLUCAP 95 85 95 109* 85   Lipid Profile: No results for input(s): CHOL, HDL, LDLCALC, TRIG,  CHOLHDL, LDLDIRECT in the last 72 hours. Thyroid Function Tests: No results for input(s): TSH, T4TOTAL, FREET4, T3FREE, THYROIDAB in the last 72 hours. Anemia Panel: No results for input(s): VITAMINB12, FOLATE, FERRITIN, TIBC, IRON, RETICCTPCT in the last 72 hours. Urine analysis:    Component Value Date/Time   COLORURINE YELLOW 07/21/2018 [place] 07/21/2018 0449   LABSPEC 1.011 07/21/2018 0449   PHURINE 5.0 07/21/2018 0449   GLUCOSEU >=500 (A) 07/21/2018 0449   HGBUR NEGATIVE 07/21/2018 0449   BILIRUBINUR NEGATIVE 07/21/2018 0449   KETONESUR NEGATIVE 07/21/2018 0449   PROTEINUR NEGATIVE 07/21/2018 0449   UROBILINOGEN 1.0 01/21/2008 1430   NITRITE NEGATIVE 07/21/2018 0449   LEUKOCYTESUR NEGATIVE 07/21/2018 0449     Orlie Cundari M.D. [facility] 07/25/2018, 1:00 PM  Pager: [phone] Between 7am to 7pm - call Pager - [phone]  After 7pm go to [email or web address] - password [email or web address]  Call night coverage person covering after 7pm

## 2018-07-26 DIAGNOSIS — N183 Chronic kidney disease, stage 3 unspecified: Secondary | ICD-10-CM

## 2018-07-26 HISTORY — DX: Chronic kidney disease, stage 3 unspecified: N18.30

## 2018-07-26 LAB — CULTURE, BLOOD (ROUTINE X 2)
CULTURE: NO GROWTH
Culture: NO GROWTH
Special Requests: ADEQUATE
Special Requests: ADEQUATE

## 2018-07-26 LAB — BASIC METABOLIC PANEL
Anion gap: 7 (ref 5–15)
BUN: 51 mg/dL — ABNORMAL HIGH (ref 8–23)
CHLORIDE: 110 mmol/L (ref 98–111)
CO2: 26 mmol/L (ref 22–32)
CREATININE: 1.7 mg/dL — AB (ref 0.61–1.24)
Calcium: 9 mg/dL (ref 8.9–10.3)
GFR calc Af Amer: 45 mL/min — ABNORMAL LOW (ref >60)
GFR, EST NON AFRICAN AMERICAN: 38 mL/min — AB (ref >60)
GLUCOSE: 93 mg/dL (ref 70–99)
POTASSIUM: 4.1 mmol/L (ref 3.5–5.1)
SODIUM: 143 mmol/L (ref 135–145)

## 2018-07-26 LAB — CBC
HCT: 24.9 % — ABNORMAL LOW (ref 39.0–52.0)
Hemoglobin: 7.6 g/dL — ABNORMAL LOW (ref 13.0–17.0)
MCH: 30.9 pg (ref 26.0–34.0)
MCHC: 30.5 g/dL (ref 30.0–36.0)
MCV: 101.2 fL — AB (ref 78.0–100.0)
PLATELETS: 331 10*3/uL (ref 150–400)
RBC: 2.46 MIL/uL — AB (ref 4.22–5.81)
RDW: 15.2 % (ref 11.5–15.5)
WBC: 12.6 10*3/uL — ABNORMAL HIGH (ref 4.0–10.5)

## 2018-07-26 LAB — GLUCOSE, CAPILLARY
GLUCOSE-CAPILLARY: 105 mg/dL — AB (ref 70–99)
GLUCOSE-CAPILLARY: 105 mg/dL — AB (ref 70–99)
GLUCOSE-CAPILLARY: 90 mg/dL (ref 70–99)
Glucose-Capillary: 100 mg/dL — ABNORMAL HIGH (ref 70–99)
Glucose-Capillary: 68 mg/dL — ABNORMAL LOW (ref 70–99)
Glucose-Capillary: 86 mg/dL (ref 70–99)
Glucose-Capillary: 87 mg/dL (ref 70–99)
Glucose-Capillary: 90 mg/dL (ref 70–99)

## 2018-07-26 LAB — HEPARIN LEVEL (UNFRACTIONATED): Heparin Unfractionated: 0.32 IU/mL (ref 0.30–0.70)

## 2018-07-26 MED ORDER — SIMETHICONE 40 MG/0.6ML PO SUSP
40.0000 mg | Freq: Four times a day (QID) | ORAL | Status: DC
  Administered 2018-07-26 (×4): 40 mg via ORAL
  Filled 2018-07-26 (×5): qty 0.6

## 2018-07-26 MED ORDER — HYDROCORTISONE 1 % EX CREA
1.0000 "application " | TOPICAL_CREAM | Freq: Three times a day (TID) | CUTANEOUS | Status: DC | PRN
  Filled 2018-07-26: qty 28

## 2018-07-26 MED ORDER — LACTATED RINGERS IV BOLUS: 1000.0000 mL | Freq: Three times a day (TID) | INTRAVENOUS | Status: DC | PRN

## 2018-07-26 MED ORDER — HYDROMORPHONE HCL 1 MG/ML IJ SOLN
0.5000 mg | INTRAMUSCULAR | Status: DC | PRN
  Administered 2018-07-26 – 2018-07-29 (×5): 1 mg via INTRAVENOUS
  Filled 2018-07-26 (×6): qty 1

## 2018-07-26 MED ORDER — HYDROCORTISONE 2.5 % RE CREA
1.0000 "application " | TOPICAL_CREAM | Freq: Four times a day (QID) | RECTAL | Status: DC | PRN
  Filled 2018-07-26: qty 28.35

## 2018-07-26 MED ORDER — METOPROLOL TARTRATE 5 MG/5ML IV SOLN: 5.0000 mg | Freq: Four times a day (QID) | INTRAVENOUS | Status: DC | PRN

## 2018-07-26 MED ORDER — ONDANSETRON HCL 40 MG/20ML IJ SOLN
8.0000 mg | Freq: Four times a day (QID) | INTRAMUSCULAR | Status: DC | PRN
  Filled 2018-07-26: qty 4

## 2018-07-26 MED ORDER — BISACODYL 10 MG RE SUPP
10.0000 mg | Freq: Every day | RECTAL | Status: DC
  Administered 2018-07-26 – 2018-07-29 (×4): 10 mg via RECTAL
  Filled 2018-07-26 (×5): qty 1

## 2018-07-26 MED ORDER — ONDANSETRON HCL 4 MG/2ML IJ SOLN
4.0000 mg | Freq: Four times a day (QID) | INTRAMUSCULAR | Status: DC | PRN
  Administered 2018-07-26: 4 mg via INTRAVENOUS
  Filled 2018-07-26 (×2): qty 2

## 2018-07-26 MED ORDER — PHENOL 1.4 % MT LIQD
1.0000 | OROMUCOSAL | Status: DC | PRN
  Administered 2018-07-28: 1 via OROMUCOSAL
  Filled 2018-07-26: qty 177

## 2018-07-26 MED ORDER — METHOCARBAMOL 1000 MG/10ML IJ SOLN
1000.0000 mg | Freq: Four times a day (QID) | INTRAVENOUS | Status: DC | PRN
  Filled 2018-07-26: qty 10

## 2018-07-26 MED ORDER — LIP MEDEX EX OINT
1.0000 "application " | TOPICAL_OINTMENT | Freq: Two times a day (BID) | CUTANEOUS | Status: DC
  Administered 2018-07-26 – 2018-07-31 (×8): 1 via TOPICAL
  Filled 2018-07-26: qty 7

## 2018-07-26 MED ORDER — ALUM & MAG HYDROXIDE-SIMETH 200-200-20 MG/5ML PO SUSP
30.0000 mL | Freq: Four times a day (QID) | ORAL | Status: DC | PRN
  Filled 2018-07-26 (×2): qty 30

## 2018-07-26 MED ORDER — GUAIFENESIN-DM 100-10 MG/5ML PO SYRP: 10.0000 mL | ORAL_SOLUTION | ORAL | Status: DC | PRN

## 2018-07-26 MED ORDER — ACETAMINOPHEN 500 MG PO TABS
1000.0000 mg | ORAL_TABLET | Freq: Three times a day (TID) | ORAL | Status: DC
  Administered 2018-07-26 – 2018-07-31 (×6): 1000 mg via ORAL
  Filled 2018-07-26 (×8): qty 2

## 2018-07-26 MED ORDER — PROCHLORPERAZINE EDISYLATE 10 MG/2ML IJ SOLN
5.0000 mg | INTRAMUSCULAR | Status: DC | PRN
  Administered 2018-07-27: 10 mg via INTRAVENOUS
  Filled 2018-07-26 (×2): qty 2

## 2018-07-26 MED ORDER — INSULIN DETEMIR 100 UNIT/ML ~~LOC~~ SOLN
6.0000 [IU] | Freq: Two times a day (BID) | SUBCUTANEOUS | Status: DC
  Administered 2018-07-26 – 2018-07-31 (×10): 6 [IU] via SUBCUTANEOUS
  Filled 2018-07-26 (×11): qty 0.06

## 2018-07-26 MED ORDER — MENTHOL 3 MG MT LOZG: 1.0000 | LOZENGE | OROMUCOSAL | Status: DC | PRN

## 2018-07-26 MED ORDER — DIPHENHYDRAMINE HCL 50 MG/ML IJ SOLN: 12.5000 mg | Freq: Four times a day (QID) | INTRAMUSCULAR | Status: DC | PRN

## 2018-07-26 NOTE — Progress Notes (Addendum)
PROGRESS NOTE    Lawrence [name]  ZLD:[id number] DOB: October 02, 1946 DOA: 07/21/2018 PCP: [name], MD  Brief Narrative:Patient was admitted by critical care service on 07/21/2018, per admission note by Ms. Eubanks 72 year old male with PMH of COPD, DM, HLD, HTN, CAD, S/P Pacemaker, A.Fib on Eliquis, Diastolic HF, OSA on CPAP at HS, H/O AVM Colonic Polys and Moderate Sigmoid Diverticulosis, GERD.  Presents to [place] ED on 7/29 with Abdominal Pain, Nausea, and reportedly two days of Hematocheziain which appeared black/tarry. Hemoglobin 8.8. Given 1 unit RBC at OSH. Eliquis reversed with Anti-Inhibitor Coagulant Complex. Patient transferred to [facility] for Further Evaluation.  On arrival to ICU patient is alert and oriented with systolic 903. On insulin gtt. Complaining of abdominal tenderness and nausea.  Patient was admitted to ICU, found to have small bowel ischemia/pneumatosis, hematochezia, underwent exploratory laparotomy with small bowel resection and seek cecectomy on 07/21/2018 (Dr Brantley Stage).  He was extubated on 7/30. Patient was transferred to hospitalist service on 8/1 and [facility] assumed care on 8/1.     Assessment & Plan:   Principal Problem:   Small bowel ischemia w pneumatosis s/p ileocectomy 07/21/2018 Active Problems:   COPD (chronic obstructive pulmonary disease) (HCC)   Morbid obesity (HCC)   CHF (congestive heart failure), NYHA class II, chronic, diastolic (HCC)   GI bleed   CKD (chronic kidney disease) stage 3, GFR 30-59 ml/min (HCC)  Small bowel ischemia/pneumatosis with hematochezia -Patient was seen by GI and general surgery. -Patient underwent exploratory laparotomy with small bowel resection and cecectomy on 07/21/2018, postop day # 4 -Status post wound VAC on 7/31, NGT removed on 7/31, surgery following, management per general surgery -Today complaining of " heartburn", placed on GI cocktail 3 times daily as needed, increase Protonix to every 12  hours -Still complaining of burping and heartburn GI cocktail has not helped.  Not able to pass gas yet.  Encouraged him to ambulate in the hallway with the staff.  Active problems Acute back pain/fall - s/p falling from the bed on 8/2 night while transferring from the ICU.  Back pain improving today -X-ray of the thoracic and lumbar spine degenerative joint disease no  acute findings. -PT eval recommended home health PT, continue Lidoderm patch, pain control   Acute blood loss anemia, iron deficiency -Likely due to #1, anticoagulation currently on hold -Baseline hemoglobin 10-11, hemoglobin 7.6 on 07/26/2018 may need transfusion if less than 7.5   Diabetes mellitus, type II -Diabetic coordinator following, decreasing the lowest dose of Levemir due to decreased blood sugar and patient being n.p.o.   -Hemoglobin A1c 7.4  History of diastolic CHF, CAD -Currently compensated, follow I's and O's closely.  Net +1.3 L  - PTA patient was on Lasix and metolazone, currently n.p.o. -Once patient on diet, will start low-dose Lasix to avoid volume overload -Currently no chest pain or shortness of breath  Paroxysmal atrial fibrillation, pacemaker -Anticoagulation was held due to #1 -Cleared by general surgery to start anti-coagulation  with IV heparin drip 07/25/2018 -Was on Eliquis at home which needs to be restarted prior to discharge.         DVT prophylaxis: Heparin Code Status: Full code Family Communication: Wife was present in the room Disposition Plan: TBD Consultants:  General surgery Procedures: Exploratory laparotomy small bowel resection and Caecoctomy Antimicrobials: None  Subjective: Sitting up in chair wife at the bedside feels a little better today compared to yesterday or whole night was having heartburn  Objective: Vitals:   07/25/18 0400 07/25/18 1411 07/25/18 2025 07/26/18 0420  BP: (!) 152/58 (!) 144/58 137/60 137/63  Pulse: 65 68 63 74  Resp: 11 17  18 18   Temp: 98 F (36.7 C) 97.8 F (36.6 C) 98 F (36.7 C) 97.7 F (36.5 C)  TempSrc: Oral Oral Oral Oral  SpO2:  97% 100% 95%  Weight:      Height:        Intake/Output Summary (Last 24 hours) at 07/26/2018 1002 Last data filed at 07/26/2018 0600 Gross per 24 hour  Intake 3823.66 ml  Output 450 ml  Net 3373.66 ml   Filed Weights   07/23/18 0436 07/24/18 0500 07/25/18 0353  Weight: 106.1 kg (233 lb 14.5 oz) 105.7 kg (233 lb) 108.4 kg (238 lb 15.7 oz)    Examination:  General exam: Appears calm and comfortable  Respiratory system: Clear to auscultation. Respiratory effort normal. Cardiovascular system: S1 & S2 heard, RRR. No JVD, murmurs, rubs, gallops or clicks. No pedal edema. Gastrointestinal system: Abdomen is stended, soft and ender. No organomegaly or masses felt. Normal bowel sounds heard.WOUND VAC IN PLACE Central nervous system: Alert and oriented. No focal neurological deficits. Extremities: Symmetric 5 x 5 power. Skin: No rashes, lesions or ulcers Psychiatry: Judgement and insight appear normal. Mood & affect appropriate.     Data Reviewed: I have personally reviewed following labs and imaging studies  CBC: Recent Labs  Lab 07/21/18 0444  07/22/18 0418 07/23/18 0433 07/24/18 1125 07/25/18 0759 07/26/18 0524  WBC 24.2*   < > 21.3* 14.5* 14.6* 12.5* 12.6*  NEUTROABS 22.4*  --   --   --   --   --   --   HGB 10.4*   < > 8.0* 7.5* 7.8* 7.8* 7.6*  HCT 31.4*   < > 24.9* 24.2* 25.5* 25.1* 24.9*  MCV 95.7   < > 98.4 101.7* 100.4* 101.2* 101.2*  PLT 364   < > 306 292 344 328 331   < > = values in this interval not displayed.   Basic Metabolic Panel: Recent Labs  Lab 07/21/18 0444  07/22/18 0418 07/23/18 0433 07/24/18 0537 07/25/18 0759 07/26/18 0524  NA 133*   < > 137 140 143 141 143  K 3.3*   < > 4.1 3.8 4.0 3.9 4.1  CL 91*   < > 103 102 107 106 110  CO2 24   < > 22 23 23 24 26   GLUCOSE 484*   < > 171* 149* 128* 118* 93  BUN 121*   < > 115* 107* 86*  62* 51*  CREATININE 2.67*   < > 2.82* 2.62* 2.25* 1.82* 1.70*  CALCIUM 9.3   < > 8.5* 9.0 9.2 9.0 9.0  MG 2.3  --   --   --  2.3  --   --   PHOS 3.6  --   --   --  4.6  --   --    < > = values in this interval not displayed.   GFR: Estimated Creatinine Clearance: 49.9 mL/min (A) (by C-G formula based on SCr of 1.7 mg/dL (H)). Liver Function Tests: Recent Labs  Lab 07/21/18 0444  AST 49*  ALT 33  ALKPHOS 239*  BILITOT 1.0  PROT 6.7  ALBUMIN 3.3*   Recent Labs  Lab 07/21/18 0444  LIPASE 60*   No results for input(s): AMMONIA in the last 168 hours. Coagulation Profile: No results for input(s): INR, PROTIME in the  last 168 hours. Cardiac Enzymes: Recent Labs  Lab 07/21/18 0444 07/21/18 1044  TROPONINI 0.03* 0.03*   BNP (last 3 results) Recent Labs    01/31/18 1101 03/04/18 1025 05/16/18 1100  PROBNP 6,793* 7,996* 3,272*   HbA1C: Recent Labs    07/25/18 0759  HGBA1C 7.4*   CBG: Recent Labs  Lab 07/25/18 2327 07/25/18 2357 07/26/18 0357 07/26/18 0420 07/26/18 0816  GLUCAP 71 68* 86 90 87   Lipid Profile: No results for input(s): CHOL, HDL, LDLCALC, TRIG, CHOLHDL, LDLDIRECT in the last 72 hours. Thyroid Function Tests: No results for input(s): TSH, T4TOTAL, FREET4, T3FREE, THYROIDAB in the last 72 hours. Anemia Panel: No results for input(s): VITAMINB12, FOLATE, FERRITIN, TIBC, IRON, RETICCTPCT in the last 72 hours. Sepsis Labs: Recent Labs  Lab 07/21/18 0444 07/21/18 0654  LATICACIDVEN 4.4* 4.4*    Recent Results (from the past 240 hour(s))  Culture, blood (routine x 2)     Status: None (Preliminary result)   Collection Time: 07/21/18  4:35 AM  Result Value Ref Range Status   Specimen Description BLOOD LEFT ARM  Final   Special Requests   Final    BOTTLES DRAWN AEROBIC ONLY Blood Culture adequate volume   Culture   Final    NO GROWTH 4 DAYS Performed at [facility], [place] [address] [place], [place] [address]    Report Status PENDING   Incomplete  Culture, blood (routine x 2)     Status: None (Preliminary result)   Collection Time: 07/21/18  4:40 AM  Result Value Ref Range Status   Specimen Description BLOOD LEFT ARM  Final   Special Requests   Final    BOTTLES DRAWN AEROBIC ONLY Blood Culture adequate volume   Culture   Final    NO GROWTH 4 DAYS Performed at [facility], [address], [place], [place] [address]    Report Status PENDING  Incomplete  MRSA PCR Screening     Status: None   Collection Time: 07/21/18  4:49 AM  Result Value Ref Range Status   MRSA by PCR NEGATIVE NEGATIVE Final    Comment:        The GeneXpert MRSA Assay (FDA approved for NASAL specimens only), is one component of a comprehensive MRSA colonization surveillance program. It is not intended to diagnose MRSA infection nor to guide or monitor treatment for MRSA infections. Performed at [facility], [place] [address] [place], [place] [address]   Surgical pcr screen     Status: None   Collection Time: 07/21/18 10:37 AM  Result Value Ref Range Status   MRSA, PCR NEGATIVE NEGATIVE Final   Staphylococcus aureus NEGATIVE NEGATIVE Final    Comment: (NOTE) The Xpert SA Assay (FDA approved for NASAL specimens in patients 72 years of age and older), is one component of a comprehensive surveillance program. It is not intended to diagnose infection nor to guide or monitor treatment. Performed at [facility], [place] [address], [place], [place] [address]          Radiology Studies: Dg Thoracic Spine W/swimmers  Result Date: 07/25/2018 CLINICAL DATA:  Upper back pain. EXAM: THORACIC SPINE - 3 VIEWS COMPARISON:  CT scan of April 10, 2018. FINDINGS: No fracture or spondylolisthesis is noted. Anterior osteophyte formation is seen involving the middle and lower thoracic spine. IMPRESSION: Multilevel degenerative disc disease. No acute abnormality seen in the thoracic spine. Electronically Signed   By: Marijo Conception, M.D.  On: 07/25/2018 12:19   Dg Lumbar Spine Complete  Result Date: 07/25/2018 CLINICAL DATA:  Lower back pain. EXAM: LUMBAR SPINE - COMPLETE 4+ VIEW COMPARISON:  CT scan of July 21, 2018. FINDINGS: No fracture or spondylolisthesis is noted. Mild degenerative disc disease is noted at L1-2, L2-3 and L4-5. Moderate degenerative disc disease is noted at L3-4. Atherosclerosis of abdominal aorta is noted. IMPRESSION: Multilevel degenerative disc disease. No acute abnormality seen in the lumbar spine. Aortic Atherosclerosis (ICD10-I70.0). Electronically Signed   By: Marijo Conception, M.D.   On: 07/25/2018 12:16        Scheduled Meds: . acetaminophen  1,000 mg Oral TID  . bisacodyl  10 mg Rectal Daily  . chlorhexidine  15 mL Mouth Rinse BID  . Chlorhexidine Gluconate Cloth  6 each Topical Daily  . insulin aspart  0-20 Units Subcutaneous Q4H  . insulin detemir  12 Units Subcutaneous Q12H  . lidocaine  1 patch Transdermal Q24H  . lip balm  1 application Topical BID  . mouth rinse  15 mL Mouth Rinse q12n4p  . pantoprazole  40 mg Intravenous Q12H  . simethicone  40 mg Oral QID  . sodium chloride flush  10-40 mL Intracatheter Q12H   Continuous Infusions: . sodium chloride    . dextrose 5 % and 0.9% NaCl 50 mL/hr at 07/26/18 0600  . heparin 1,150 Units/hr (07/26/18 0600)  . lactated ringers    . methocarbamol (ROBAXIN) IV    . ondansetron (ZOFRAN) IV       LOS: 5 days       Georgette Shell, MD [facility]  If 7PM-7AM, please contact night-coverage [email or web address] Password [email or web address] 07/26/2018, 10:02 AM

## 2018-07-26 NOTE — Progress Notes (Signed)
ANTICOAGULATION CONSULT NOTE - Initial Consult  Pharmacy Consult for Heparin Indication: atrial fibrillation  Allergies  Allergen Reactions  . Bactrim [Sulfamethoxazole-Trimethoprim] Other (See Comments)    Heart to sleep, renal failure, potassium level spiked    Patient Measurements: Height: 6' (182.9 cm) Weight: 238 lb 15.7 oz (108.4 kg) IBW/kg (Calculated) : 77.6 Heparin Dosing Weight: 99.2 kg  Vital Signs: Temp: 97.7 F (36.5 C) (08/03 0420) Temp Source: Oral (08/03 0420) BP: 137/63 (08/03 0420) Pulse Rate: 74 (08/03 0420)  Labs: Recent Labs    07/24/18 0537  07/24/18 1125 07/25/18 0759 07/25/18 2039 07/26/18 0524  HGB  --    < > 7.8* 7.8*  --  7.6*  HCT  --   --  25.5* 25.1*  --  24.9*  PLT  --   --  344 328  --  331  HEPARINUNFRC  --   --   --   --  0.33 0.32  CREATININE 2.25*  --   --  1.82*  --  1.70*   < > = values in this interval not displayed.    Estimated Creatinine Clearance: 49.9 mL/min (A) (by C-G formula based on SCr of 1.7 mg/dL (H)).   Medical History: Past Medical History:  Diagnosis Date  . Anemia   . Chronic airway obstruction, not elsewhere classified   . Chronic ischemic heart disease, unspecified   . Coronary artery disease   . Diabetes mellitus    type II, neuropathy,   . Essential hypertension, benign   . Hyperlipidemia, mixed   . Nodule of kidney    incidentally found on CT abdomen 11/2012.  Pending Urology evaluation.   . Nontoxic multinodular goiter   . Obesity   . Other testicular hypofunction   . PUD (peptic ulcer disease)   . Renal insufficiency, mild    hx of  . Sleep apnea    uses cpap    Assessment: 72 year old male who has history of AVMs and on Eliquis prior to admission for atrial fibrillation was admitted 7/29 from [facility] ED due to GIB s/p reversal at [facility] and transferred to [facility]. Patient was found to have small bowel ischemia with pneumatosis s/p ex lap with small bowel resection and cecectomy on  07/21/18. Surgery ok'd re-initiating IV Heparin on 8/3.   Heparin level remains therapeutic on 1150 units/hr.  Hgb down today at 7.6, Platelets stable.  No active bleeding.  SCr trending down at 1.70. Last dose of Eliquis was 07/20/18.    Goal of Therapy:  Heparin level 0.3-0.7 units/ml Monitor platelets by anticoagulation protocol: Yes   Plan:  Continue Heparin 1150 units/hr Daily heparin level and CBC while on therapy.   Sloan Leiter, PharmD, BCPS, BCCCP Clinical Pharmacist Clinical phone 07/26/2018 until 3:30PM [phone] Please refer to [facility] for [facility] numbers 07/26/2018 9:20 AM

## 2018-07-26 NOTE — Progress Notes (Signed)
Inpatient Diabetes Program Recommendations  AACE/ADA: New Consensus Statement on Inpatient Glycemic Control (2019)  Target Ranges:  Prepandial:   less than 140 mg/dL      Peak postprandial:   less than 180 mg/dL (1-2 hours)      Critically ill patients:  140 - 180 mg/dL   Results for [name], Lawrence [name] (MRN [id number]) as of 07/26/2018 08:47  Ref. Range 07/25/2018 07:56 07/25/2018 12:22 07/25/2018 16:08 07/25/2018 20:47 07/25/2018 23:27 07/25/2018 23:57 07/26/2018 03:57 07/26/2018 04:20 07/26/2018 08:16  Glucose-Capillary Latest Ref Range: 70 - 99 mg/dL 109 (H) 85 83 74 71 68 (L) 86 90 87   Review of Glycemic Control   Current orders for Inpatient glycemic control: Levemir 12 units Q12H, Novolog 0-20 units Q4H  Inpatient Diabetes Program Recommendations:  Insulin - Basal: Glucose has been trending low. Please consider decreasing Levemir to 10 units Q12H.  Thanks, Barnie Alderman, RN, MSN, CDE Diabetes Coordinator Inpatient Diabetes Program [phone] (Team Pager from 8am to 5pm)

## 2018-07-26 NOTE — Progress Notes (Signed)
Pt's blood sugar at this time is 71, notified MD. D5NS ordered. Will monitor pt.

## 2018-07-26 NOTE — Progress Notes (Signed)
[facility] Progress Note  5 Days Post-Op  Subjective: CC:  Pain and nausea last night.  Better this morning.  Still burping.  Not much flatus.  Doing ice chips.  Wanted to try some clears.  Wife in room.  Up in chair.   Objective: Vital signs in last 24 hours: Temp:  [97.7 F (36.5 C)-98 F (36.7 C)] 97.7 F (36.5 C) (08/03 0420) Pulse Rate:  [63-74] 74 (08/03 0420) Resp:  [17-18] 18 (08/03 0420) BP: (137-144)/(58-63) 137/63 (08/03 0420) SpO2:  [95 %-100 %] 95 % (08/03 0420) Last BM Date: 07/20/18  Intake/Output from previous day: 08/02 0701 - 08/03 0700 In: 3823.7 [I.V.:3823.7] Out: 650 [Urine:650] Intake/Output this shift: No intake/output data recorded.  PE:  General: Pt awake/alert/oriented x4 in no major acute distress.  Tired but not toxic. Eyes: PERRL, normal EOM. Sclera nonicteric Neuro: CN II-XII intact w/o focal sensory/motor deficits. Lymph: No head/neck/groin lymphadenopathy Psych:  No delerium/psychosis/paranoia HENT: Normocephalic, Mucus membranes moist.  No thrush Neck: Supple, No tracheal deviation Chest: No pain.  Good respiratory excursion.  Mild conversational dyspnea CV:  Pulses intact.  Occasionally irregular MS: Normal AROM mjr joints.  No obvious deformity  Abdomen: Morbidly obese but soft.  Mildly distended.  Wound VAC in the midline incision clean.  No severe peritonitis.  No incarcerated hernias.  Ext:  SCDs BLE.  No significant edema.  No cyanosis Skin: No petechiae / purpura   Lab Results:  Recent Labs    07/25/18 0759 07/26/18 0524  WBC 12.5* 12.6*  HGB 7.8* 7.6*  HCT 25.1* 24.9*  PLT 328 331   BMET Recent Labs    07/25/18 0759 07/26/18 0524  NA 141 143  K 3.9 4.1  CL 106 110  CO2 24 26  GLUCOSE 118* 93  BUN 62* 51*  CREATININE 1.82* 1.70*  CALCIUM 9.0 9.0   PT/INR No results for input(s): LABPROT, INR in the last 72 hours. CMP     Component Value Date/Time   NA 143 07/26/2018 0524   NA 137  05/16/2018 1100   NA 139 03/29/2015 0926   K 4.1 07/26/2018 0524   K 4.4 03/29/2015 0926   CL 110 07/26/2018 0524   CL 108 (H) 06/09/2013 1218   CO2 26 07/26/2018 0524   CO2 24 03/29/2015 0926   GLUCOSE 93 07/26/2018 0524   GLUCOSE 239 (H) 03/29/2015 0926   GLUCOSE 235 (H) 06/09/2013 1218   BUN 51 (H) 07/26/2018 0524   BUN 59 (H) 05/16/2018 1100   BUN 25.8 03/29/2015 0926   CREATININE 1.70 (H) 07/26/2018 0524   CREATININE 1.2 03/29/2015 0926   CALCIUM 9.0 07/26/2018 0524   CALCIUM 8.9 03/29/2015 0926   PROT 6.7 07/21/2018 0444   PROT 6.6 12/18/2017 1443   PROT 6.5 03/29/2015 0926   ALBUMIN 3.3 (L) 07/21/2018 0444   ALBUMIN 4.1 12/18/2017 1443   ALBUMIN 3.5 03/29/2015 0926   AST 49 (H) 07/21/2018 0444   AST 20 03/29/2015 0926   ALT 33 07/21/2018 0444   ALT 17 03/29/2015 0926   ALKPHOS 239 (H) 07/21/2018 0444   ALKPHOS 136 03/29/2015 0926   BILITOT 1.0 07/21/2018 0444   BILITOT 0.8 12/18/2017 1443   BILITOT 0.63 03/29/2015 0926   GFRNONAA 38 (L) 07/26/2018 0524   GFRAA 45 (L) 07/26/2018 0524   Lipase     Component Value Date/Time   LIPASE 60 (H) 07/21/2018 0444   Studies/Results: Dg Thoracic Spine W/swimmers  Result Date: 07/25/2018 CLINICAL DATA:  Upper back pain. EXAM: THORACIC SPINE - 3 VIEWS COMPARISON:  CT scan of April 10, 2018. FINDINGS: No fracture or spondylolisthesis is noted. Anterior osteophyte formation is seen involving the middle and lower thoracic spine. IMPRESSION: Multilevel degenerative disc disease. No acute abnormality seen in the thoracic spine. Electronically Signed   By: Marijo Conception, M.D.   On: 07/25/2018 12:19   Dg Lumbar Spine Complete  Result Date: 07/25/2018 CLINICAL DATA:  Lower back pain. EXAM: LUMBAR SPINE - COMPLETE 4+ VIEW COMPARISON:  CT scan of July 21, 2018. FINDINGS: No fracture or spondylolisthesis is noted. Mild degenerative disc disease is noted at L1-2, L2-3 and L4-5. Moderate degenerative disc disease is noted at L3-4.  Atherosclerosis of abdominal aorta is noted. IMPRESSION: Multilevel degenerative disc disease. No acute abnormality seen in the lumbar spine. Aortic Atherosclerosis (ICD10-I70.0). Electronically Signed   By: Marijo Conception, M.D.   On: 07/25/2018 12:16    Anti-infectives: Anti-infectives (From admission, onward)   Start     Dose/Rate Route Frequency Ordered Stop   07/21/18 1315  cefoTEtan (CEFOTAN) 2 g in sodium chloride 0.9 % 100 mL IVPB  Status:  Discontinued     2 g 200 mL/hr over 30 Minutes Intravenous Every 12 hours 07/21/18 1309 07/25/18 1220   07/21/18 1315  cefoTEtan (CEFOTAN) 2 g in sodium chloride 0.9 % 100 mL IVPB     2 g 200 mL/hr over 30 Minutes Intravenous To Surgery 07/21/18 1312 07/21/18 1210     Patient Active Problem List   Diagnosis Date Noted  . CKD (chronic kidney disease) stage 3, GFR 30-59 ml/min (HCC) 07/26/2018  . GI bleed 07/21/2018  . Small bowel ischemia w pneumatosis s/p ileocectomy 07/21/2018   . Acute on chronic diastolic CHF (congestive heart failure) ([place]) 01/03/2018  . CAP (community acquired pneumonia) 01/02/2018  . CHF (congestive heart failure), NYHA class II, chronic, diastolic ([place]) 24/26/8341  . Pacemaker 09/13/2017  . Bradycardia with 31-40 beats per minute 08/21/2017  . Paroxysmal atrial fibrillation ([place]) 06/25/2017  . Anemia, iron deficiency 12/28/2014  . Renal lesion 05/25/2014  . Morbid obesity ([place]) 03/10/2013  . COPD (chronic obstructive pulmonary disease) ([place]) 01/22/2013  . Anemia   . Coronary artery disease   . Hyperlipidemia, mixed   . Essential hypertension, benign      Assessment/Plan HTN HLD T2DM COPD Hx of CAD S/p pacemaker placement A.Fib on eliquis - last dose yesterday, reversal agent given, s/p 1 unit PRBC Chronic Diastolic CHF/Chronic ischemic heart disease OSA on CPAP Hx of colonic polyps and moderate sigmoid diverticulosis GERD Non-toxic multinodular goiter AKI on CKD stage III - Cr 2.82, continue IVF,  limit nephrotoxic agents Obesity  Small bowel ischemia with pneumatosis S/p exploratory laparotomy with small bowel resection and cecectomy 07/21/18 Dr. Brantley Stage -POD#5 - DQQ22.9, plateuing - NGT removed 7/31, post-op ileus with hiccuping and belching.  May need NG tube if worsen.  Stand and nausea control.  Try to mobilize.  Hopefully ileus will resolve.  - VAC M/W/F - OOB, mobilize with therapies.  Try and get him up more.  FEN: ice chips, sips w meds - continue NPO + ice/sips with meds and await further bowel function.  Most likely would benefit from some diuresis with him being up almost 5 L and heart issues, ;but, with his chronic kidney disease, will defer to medicine to feel if it safe to do a test lasix dose. ID: [email or web address] 7/29 >>.  Can stop antibiotics from surgery standpoint. VTE: SCD's,  chemical VTE has been held 2/2 anemia (hgb 7.8).  Transfuse as needed Foley: none    LOS: 5 days    Adin Hector , [facility] Surgery 07/26/2018, 8:05 AM Pager: [phone] Consults: [phone] Mon-Fri 7:00 am-4:30 pm Sat-Sun 7:00 am-11:30 am

## 2018-07-27 ENCOUNTER — Inpatient Hospital Stay (HOSPITAL_COMMUNITY): Payer: Medicare Other

## 2018-07-27 LAB — CBC
HCT: 27.4 % — ABNORMAL LOW (ref 39.0–52.0)
Hemoglobin: 8 g/dL — ABNORMAL LOW (ref 13.0–17.0)
MCH: 30.4 pg (ref 26.0–34.0)
MCHC: 29.2 g/dL — AB (ref 30.0–36.0)
MCV: 104.2 fL — ABNORMAL HIGH (ref 78.0–100.0)
Platelets: 346 10*3/uL (ref 150–400)
RBC: 2.63 MIL/uL — ABNORMAL LOW (ref 4.22–5.81)
RDW: 15.5 % (ref 11.5–15.5)
WBC: 13.6 10*3/uL — ABNORMAL HIGH (ref 4.0–10.5)

## 2018-07-27 LAB — GLUCOSE, CAPILLARY
Glucose-Capillary: 104 mg/dL — ABNORMAL HIGH (ref 70–99)
Glucose-Capillary: 112 mg/dL — ABNORMAL HIGH (ref 70–99)
Glucose-Capillary: 154 mg/dL — ABNORMAL HIGH (ref 70–99)
Glucose-Capillary: 116 mg/dL — ABNORMAL HIGH (ref 70–99)
Glucose-Capillary: 118 mg/dL — ABNORMAL HIGH (ref 70–99)

## 2018-07-27 LAB — BASIC METABOLIC PANEL
Anion gap: 10 (ref 5–15)
BUN: 36 mg/dL — AB (ref 8–23)
CALCIUM: 9 mg/dL (ref 8.9–10.3)
CHLORIDE: 112 mmol/L — AB (ref 98–111)
CO2: 23 mmol/L (ref 22–32)
CREATININE: 1.49 mg/dL — AB (ref 0.61–1.24)
GFR calc Af Amer: 52 mL/min — ABNORMAL LOW (ref >60)
GFR calc non Af Amer: 45 mL/min — ABNORMAL LOW (ref >60)
Glucose, Bld: 122 mg/dL — ABNORMAL HIGH (ref 70–99)
Potassium: 3.7 mmol/L (ref 3.5–5.1)
SODIUM: 145 mmol/L (ref 135–145)

## 2018-07-27 LAB — HEPARIN LEVEL (UNFRACTIONATED)
HEPARIN UNFRACTIONATED: 0.17 [IU]/mL — AB (ref 0.30–0.70)
HEPARIN UNFRACTIONATED: 0.25 [IU]/mL — AB (ref 0.30–0.70)

## 2018-07-27 MED ORDER — POTASSIUM CHLORIDE 10 MEQ/100ML IV SOLN
10.0000 meq | INTRAVENOUS | Status: AC
  Administered 2018-07-27 (×2): 10 meq via INTRAVENOUS
  Filled 2018-07-27: qty 100

## 2018-07-27 MED ORDER — DEXTROSE 50 % IV SOLN
INTRAVENOUS | Status: AC
  Filled 2018-07-27: qty 50

## 2018-07-27 MED ORDER — SIMETHICONE 80 MG PO CHEW
40.0000 mg | CHEWABLE_TABLET | Freq: Three times a day (TID) | ORAL | Status: AC
  Administered 2018-07-27 – 2018-07-28 (×8): 40 mg via ORAL
  Filled 2018-07-27 (×8): qty 1

## 2018-07-27 MED ORDER — FUROSEMIDE 10 MG/ML IJ SOLN
40.0000 mg | Freq: Once | INTRAMUSCULAR | Status: AC
  Administered 2018-07-27: 40 mg via INTRAVENOUS
  Filled 2018-07-27: qty 4

## 2018-07-27 MED ORDER — ONDANSETRON HCL 4 MG/2ML IJ SOLN
4.0000 mg | Freq: Four times a day (QID) | INTRAMUSCULAR | Status: DC
  Administered 2018-07-27 – 2018-07-28 (×4): 4 mg via INTRAVENOUS
  Filled 2018-07-27 (×4): qty 2

## 2018-07-27 MED ORDER — SODIUM CHLORIDE 0.9 % IV SOLN
25.0000 mg | Freq: Four times a day (QID) | INTRAVENOUS | Status: DC | PRN
  Administered 2018-07-27 (×2): 25 mg via INTRAVENOUS
  Filled 2018-07-27 (×4): qty 1

## 2018-07-27 MED ORDER — METOCLOPRAMIDE HCL 5 MG/ML IJ SOLN
5.0000 mg | Freq: Three times a day (TID) | INTRAMUSCULAR | Status: DC | PRN
  Administered 2018-07-27 (×2): 10 mg via INTRAVENOUS
  Filled 2018-07-27: qty 2

## 2018-07-27 NOTE — Progress Notes (Addendum)
PROGRESS NOTE    Lawrence [name]  XVQ:[id number] DOB: January 21, 1946 DOA: 07/21/2018 PCP: [name], MD  Brief Narrative:Patient was admitted by critical care service on 07/21/2018, per admission note by Ms. Eubanks 72 year old male with PMH of COPD, DM, HLD, HTN, CAD, S/P Pacemaker, A.Fib on Eliquis, Diastolic HF, OSA on CPAP at HS, H/O AVM Colonic Polys and Moderate Sigmoid Diverticulosis, GERD.  Presents to [place] ED on 7/29 with Abdominal Pain, Nausea, and reportedly two days of Hematocheziain which appeared black/tarry. Hemoglobin 8.8. Given 1 unit RBC at OSH. Eliquis reversed with Anti-Inhibitor Coagulant Complex. Patient transferred to [facility] for Further Evaluation.  On arrival to ICU patient is alert and oriented with systolic 093. On insulin gtt. Complaining of abdominal tenderness and nausea.  Patient was admitted to ICU, found to have small bowel ischemia/pneumatosis, hematochezia, underwent exploratory laparotomy with small bowel resection and seek cecectomy on 07/21/2018 (Dr Brantley Stage). He was extubated on 7/30. Patient was transferred to hospitalist service on 8/1 and [facility] assumed care on 8/1.      Assessment & Plan:   Principal Problem:   Small bowel ischemia w pneumatosis s/p ileocectomy 07/21/2018 Active Problems:   COPD (chronic obstructive pulmonary disease) (HCC)   Morbid obesity (HCC)   CHF (congestive heart failure), NYHA class II, chronic, diastolic (HCC)   GI bleed   CKD (chronic kidney disease) stage 3, GFR 30-59 ml/min (HCC) Small bowel ischemia/pneumatosis with hematochezia -Patient was seen by GI and general surgery. -Patient underwent exploratory laparotomy with small bowel resection and cecectomy on 07/21/2018, postop day #6 -Status post wound VAC on 7/31, NGT removed on 7/31, surgery following, management per general surgery - complaining of"heartburn", placed on GI cocktail 3 times daily as needed, he states is not helping at all. increase  Protonix to every 12 hours -Still complaining of burping and heartburn GI cocktail has not helped.  Not able to pass gas yet.  Encouraged him to ambulate in the hallway with the staff. -Check KUB  Active problems Acute back pain/fall -s/pfalling from the bedon 8/2night while transferring from the ICU.Back pain improving today -X-ray of the thoracic and lumbar spine degenerative joint disease no  acute findings. -PT eval recommended home health PT, continue Lidoderm patch, pain control  Acute blood loss anemia, iron deficiency -Likely due to #1, anticoagulation currently on hold -Baseline hemoglobin 10-11, hemoglobin 8.0 on 07/27/2018 may need transfusion if less than 7.5   Diabetes mellitus, type II -Diabetic coordinator following, decreasing the lowest dose of Levemir due to decreased blood sugar and patient being n.p.o.   -Hemoglobin A1c 7.4 -Continue 550 cc an hour until he is able to tolerate p.o. intake.  History of diastolic CHF, CAD -Currently compensated, follow I's and O's closely. Net +5 L  - PTA patient was on Lasix and metolazone, currently n.p.o. we will give him 1 dose of Lasix 40 mg x 1.  Reassess tomorrow. -Monitor QT on  standing dose of Zofran.  Last QT interval was 493.  Paroxysmal atrial fibrillation, pacemaker -Anticoagulation was held due to #1 -Cleared by general surgery to start anti-coagulation  with IV heparin drip 07/25/2018 -Was on Eliquis at home which needs to be restarted prior to discharge. -Start Eliquis once cleared by surgery. DVT prophylaxis: Heparin Code Status: Full code Family Communication son in the room Disposition Plan TBD Consultants:   Surge[facility]  Procedures: Antimicrobials  Subjective: Says he did not have a good night he did not sleep till 4:30 AM  had nausea all night long vomited once I have passed gas 5 times yesterday but none today  Objective: Vitals:   07/26/18 0420 07/26/18 1332 07/26/18 2032 07/27/18 0429    BP: 137/63 (!) 135/54 (!) 154/66 (!) 149/54  Pulse: 74 68 69 71  Resp: 18 17 17 17   Temp: 97.7 F (36.5 C) (!) 97.5 F (36.4 C) 97.6 F (36.4 C) 98 F (36.7 C)  TempSrc: Oral Oral Oral Oral  SpO2: 95% 97% 97% 96%  Weight:      Height:        Intake/Output Summary (Last 24 hours) at 07/27/2018 1244 Last data filed at 07/27/2018 0820 Gross per 24 hour  Intake 1680.7 ml  Output 1050 ml  Net 630.7 ml   Filed Weights   07/23/18 0436 07/24/18 0500 07/25/18 0353  Weight: 106.1 kg (233 lb 14.5 oz) 105.7 kg (233 lb) 108.4 kg (238 lb 15.7 oz)    Examination:  General exam: Appears calm and comfortable  Respiratory system: Clear to auscultation. Respiratory effort normal. Cardiovascular system: S1 & S2 heard, RRR. No JVD, murmurs, rubs, gallops or clicks. No pedal edema. Gastrointestinal system: Abdomen is distended, soft and tender. No organomegaly or masses felt.  Diminished bowel sounds heard. Central nervous system: Alert and oriented. No focal neurological deficits. Extremities: Symmetric 5 x 5 power. Skin: No rashes, lesions or ulcers Psychiatry: Judgement and insight appear normal. Mood & affect appropriate.     Data Reviewed: I have personally reviewed following labs and imaging studies  CBC: Recent Labs  Lab 07/21/18 0444  07/23/18 0433 07/24/18 1125 07/25/18 0759 07/26/18 0524 07/27/18 0711  WBC 24.2*   < > 14.5* 14.6* 12.5* 12.6* 13.6*  NEUTROABS 22.4*  --   --   --   --   --   --   HGB 10.4*   < > 7.5* 7.8* 7.8* 7.6* 8.0*  HCT 31.4*   < > 24.2* 25.5* 25.1* 24.9* 27.4*  MCV 95.7   < > 101.7* 100.4* 101.2* 101.2* 104.2*  PLT 364   < > 292 344 328 331 346   < > = values in this interval not displayed.   Basic Metabolic Panel: Recent Labs  Lab 07/21/18 0444  07/23/18 0433 07/24/18 0537 07/25/18 0759 07/26/18 0524 07/27/18 0711  NA 133*   < > 140 143 141 143 145  K 3.3*   < > 3.8 4.0 3.9 4.1 3.7  CL 91*   < > 102 107 106 110 112*  CO2 24   < > 23 23 24  26 23   GLUCOSE 484*   < > 149* 128* 118* 93 122*  BUN 121*   < > 107* 86* 62* 51* 36*  CREATININE 2.67*   < > 2.62* 2.25* 1.82* 1.70* 1.49*  CALCIUM 9.3   < > 9.0 9.2 9.0 9.0 9.0  MG 2.3  --   --  2.3  --   --   --   PHOS 3.6  --   --  4.6  --   --   --    < > = values in this interval not displayed.   GFR: Estimated Creatinine Clearance: 57 mL/min (A) (by C-G formula based on SCr of 1.49 mg/dL (H)). Liver Function Tests: Recent Labs  Lab 07/21/18 0444  AST 49*  ALT 33  ALKPHOS 239*  BILITOT 1.0  PROT 6.7  ALBUMIN 3.3*   Recent Labs  Lab 07/21/18 0444  LIPASE 60*   No  results for input(s): AMMONIA in the last 168 hours. Coagulation Profile: No results for input(s): INR, PROTIME in the last 168 hours. Cardiac Enzymes: Recent Labs  Lab 07/21/18 0444 07/21/18 1044  TROPONINI 0.03* 0.03*   BNP (last 3 results) Recent Labs    01/31/18 1101 03/04/18 1025 05/16/18 1100  PROBNP 6,793* 7,996* 3,272*   HbA1C: Recent Labs    07/25/18 0759  HGBA1C 7.4*   CBG: Recent Labs  Lab 07/26/18 1957 07/26/18 2344 07/27/18 0426 07/27/18 0757 07/27/18 1150  GLUCAP 105* 105* 104* 112* 154*   Lipid Profile: No results for input(s): CHOL, HDL, LDLCALC, TRIG, CHOLHDL, LDLDIRECT in the last 72 hours. Thyroid Function Tests: No results for input(s): TSH, T4TOTAL, FREET4, T3FREE, THYROIDAB in the last 72 hours. Anemia Panel: No results for input(s): VITAMINB12, FOLATE, FERRITIN, TIBC, IRON, RETICCTPCT in the last 72 hours. Sepsis Labs: Recent Labs  Lab 07/21/18 0444 07/21/18 0654  LATICACIDVEN 4.4* 4.4*    Recent Results (from the past 240 hour(s))  Culture, blood (routine x 2)     Status: None   Collection Time: 07/21/18  4:35 AM  Result Value Ref Range Status   Specimen Description BLOOD LEFT ARM  Final   Special Requests   Final    BOTTLES DRAWN AEROBIC ONLY Blood Culture adequate volume   Culture   Final    NO GROWTH 5 DAYS Performed at [facility], [address], [place], [place] [address]    Report Status 07/26/2018 FINAL  Final  Culture, blood (routine x 2)     Status: None   Collection Time: 07/21/18  4:40 AM  Result Value Ref Range Status   Specimen Description BLOOD LEFT ARM  Final   Special Requests   Final    BOTTLES DRAWN AEROBIC ONLY Blood Culture adequate volume   Culture   Final    NO GROWTH 5 DAYS Performed at [facility], [place] [address] [place], [place] [address]    Report Status 07/26/2018 FINAL  Final  MRSA PCR Screening     Status: None   Collection Time: 07/21/18  4:49 AM  Result Value Ref Range Status   MRSA by PCR NEGATIVE NEGATIVE Final    Comment:        The GeneXpert MRSA Assay (FDA approved for NASAL specimens only), is one component of a comprehensive MRSA colonization surveillance program. It is not intended to diagnose MRSA infection nor to guide or monitor treatment for MRSA infections. Performed at [facility], [place] [address], [place], [place] [address]   Surgical pcr screen     Status: None   Collection Time: 07/21/18 10:37 AM  Result Value Ref Range Status   MRSA, PCR NEGATIVE NEGATIVE Final   Staphylococcus aureus NEGATIVE NEGATIVE Final    Comment: (NOTE) The Xpert SA Assay (FDA approved for NASAL specimens in patients 24 years of age and older), is one component of a comprehensive surveillance program. It is not intended to diagnose infection nor to guide or monitor treatment. Performed at [facility], [place] [address], [place],  [address]          Radiology Studies: No results found.      Scheduled Meds: . acetaminophen  1,000 mg Oral TID  . bisacodyl  10 mg Rectal Daily  . chlorhexidine  15 mL Mouth Rinse BID  . Chlorhexidine Gluconate Cloth  6 each Topical Daily  . insulin aspart  0-20 Units Subcutaneous Q4H  .  insulin detemir  6 Units Subcutaneous Q12H  . lidocaine  1 patch Transdermal Q24H  . lip balm  1 application Topical BID  .  mouth rinse  15 mL Mouth Rinse q12n4p  . ondansetron  4 mg Intravenous Q6H  . pantoprazole  40 mg Intravenous Q12H  . simethicone  40 mg Oral TID AC & HS  . sodium chloride flush  10-40 mL Intracatheter Q12H   Continuous Infusions: . sodium chloride    . chlorproMAZINE (THORAZINE) IV 25 mg (07/27/18 1238)  . dextrose 5 % and 0.9% NaCl 50 mL/hr at 07/27/18 0626  . heparin 1,400 Units/hr (07/27/18 1026)  . lactated ringers    . methocarbamol (ROBAXIN) IV       LOS: 6 days     Georgette Shell, MD [facility]  If 7PM-7AM, please contact night-coverage [email or web address] Password [email or web address] 07/27/2018, 12:44 PM

## 2018-07-27 NOTE — Progress Notes (Signed)
Patient refused CPAP for the night  

## 2018-07-27 NOTE — Progress Notes (Signed)
Pt vomited with bile material. Pt nauseated all night. Gave PRN zofran and compazine

## 2018-07-27 NOTE — Progress Notes (Signed)
[facility] Progress Note  6 Days Post-Op  Subjective: CC:  Nausea & emesis x 1 last night.  Better this morning.  Less burping.    Starting to have flatus.  Son in room.     Objective: Vital signs in last 24 hours: Temp:  [97.5 F (36.4 C)-98 F (36.7 C)] 98 F (36.7 C) (08/04 0429) Pulse Rate:  [68-71] 71 (08/04 0429) Resp:  [17] 17 (08/04 0429) BP: (135-154)/(54-66) 149/54 (08/04 0429) SpO2:  [96 %-97 %] 96 % (08/04 0429) Last BM Date: 07/21/18  Intake/Output from previous day: 08/03 0701 - 08/04 0700 In: 1680.7 [P.O.:180; I.V.:1500.7] Out: 725 [Urine:475; Emesis/NG output:200; Drains:50] Intake/Output this shift: No intake/output data recorded.  PE:  General: Pt awake/alert/oriented x4 in no major acute distress.  Tired but not toxic. Eyes: PERRL, normal EOM. Sclera nonicteric Neuro: CN II-XII intact w/o focal sensory/motor deficits. Lymph: No head/neck/groin lymphadenopathy Psych:  No delerium/psychosis/paranoia HENT: Normocephalic, Mucus membranes moist.  No thrush Neck: Supple, No tracheal deviation Chest: No pain.  Good respiratory excursion.  Mild conversational dyspnea CV:  Pulses intact.  Occasionally irregular MS: Normal AROM mjr joints.  No obvious deformity  Abdomen: Morbidly obese but soft.  Mildly distended.  Wound VAC in the midline incision clean.  No severe peritonitis.  No incarcerated hernias.  Ext:  SCDs BLE.  No significant edema.  No cyanosis Skin: No petechiae / purpura   Lab Results:  Recent Labs    07/25/18 0759 07/26/18 0524  WBC 12.5* 12.6*  HGB 7.8* 7.6*  HCT 25.1* 24.9*  PLT 328 331   BMET Recent Labs    07/25/18 0759 07/26/18 0524  NA 141 143  K 3.9 4.1  CL 106 110  CO2 24 26  GLUCOSE 118* 93  BUN 62* 51*  CREATININE 1.82* 1.70*  CALCIUM 9.0 9.0   PT/INR No results for input(s): LABPROT, INR in the last 72 hours. CMP     Component Value Date/Time   NA 143 07/26/2018 0524   NA 137  05/16/2018 1100   NA 139 03/29/2015 0926   K 4.1 07/26/2018 0524   K 4.4 03/29/2015 0926   CL 110 07/26/2018 0524   CL 108 (H) 06/09/2013 1218   CO2 26 07/26/2018 0524   CO2 24 03/29/2015 0926   GLUCOSE 93 07/26/2018 0524   GLUCOSE 239 (H) 03/29/2015 0926   GLUCOSE 235 (H) 06/09/2013 1218   BUN 51 (H) 07/26/2018 0524   BUN 59 (H) 05/16/2018 1100   BUN 25.8 03/29/2015 0926   CREATININE 1.70 (H) 07/26/2018 0524   CREATININE 1.2 03/29/2015 0926   CALCIUM 9.0 07/26/2018 0524   CALCIUM 8.9 03/29/2015 0926   PROT 6.7 07/21/2018 0444   PROT 6.6 12/18/2017 1443   PROT 6.5 03/29/2015 0926   ALBUMIN 3.3 (L) 07/21/2018 0444   ALBUMIN 4.1 12/18/2017 1443   ALBUMIN 3.5 03/29/2015 0926   AST 49 (H) 07/21/2018 0444   AST 20 03/29/2015 0926   ALT 33 07/21/2018 0444   ALT 17 03/29/2015 0926   ALKPHOS 239 (H) 07/21/2018 0444   ALKPHOS 136 03/29/2015 0926   BILITOT 1.0 07/21/2018 0444   BILITOT 0.8 12/18/2017 1443   BILITOT 0.63 03/29/2015 0926   GFRNONAA 38 (L) 07/26/2018 0524   GFRAA 45 (L) 07/26/2018 0524   Lipase     Component Value Date/Time   LIPASE 60 (H) 07/21/2018 0444   Studies/Results: Dg Thoracic Spine W/swimmers  Result Date: 07/25/2018 CLINICAL DATA:  Upper back  pain. EXAM: THORACIC SPINE - 3 VIEWS COMPARISON:  CT scan of April 10, 2018. FINDINGS: No fracture or spondylolisthesis is noted. Anterior osteophyte formation is seen involving the middle and lower thoracic spine. IMPRESSION: Multilevel degenerative disc disease. No acute abnormality seen in the thoracic spine. Electronically Signed   By: Marijo Conception, M.D.   On: 07/25/2018 12:19   Dg Lumbar Spine Complete  Result Date: 07/25/2018 CLINICAL DATA:  Lower back pain. EXAM: LUMBAR SPINE - COMPLETE 4+ VIEW COMPARISON:  CT scan of July 21, 2018. FINDINGS: No fracture or spondylolisthesis is noted. Mild degenerative disc disease is noted at L1-2, L2-3 and L4-5. Moderate degenerative disc disease is noted at L3-4.  Atherosclerosis of abdominal aorta is noted. IMPRESSION: Multilevel degenerative disc disease. No acute abnormality seen in the lumbar spine. Aortic Atherosclerosis (ICD10-I70.0). Electronically Signed   By: Marijo Conception, M.D.   On: 07/25/2018 12:16    Anti-infectives: Anti-infectives (From admission, onward)   Start     Dose/Rate Route Frequency Ordered Stop   07/21/18 1315  cefoTEtan (CEFOTAN) 2 g in sodium chloride 0.9 % 100 mL IVPB  Status:  Discontinued     2 g 200 mL/hr over 30 Minutes Intravenous Every 12 hours 07/21/18 1309 07/25/18 1220   07/21/18 1315  cefoTEtan (CEFOTAN) 2 g in sodium chloride 0.9 % 100 mL IVPB     2 g 200 mL/hr over 30 Minutes Intravenous To Surgery 07/21/18 1312 07/21/18 1210     Patient Active Problem List   Diagnosis Date Noted  . CKD (chronic kidney disease) stage 3, GFR 30-59 ml/min (HCC) 07/26/2018  . GI bleed 07/21/2018  . Small bowel ischemia w pneumatosis s/p ileocectomy 07/21/2018   . Acute on chronic diastolic CHF (congestive heart failure) ([place]) 01/03/2018  . CAP (community acquired pneumonia) 01/02/2018  . CHF (congestive heart failure), NYHA class II, chronic, diastolic ([place]) 23/55/7322  . Pacemaker 09/13/2017  . Bradycardia with 31-40 beats per minute 08/21/2017  . Paroxysmal atrial fibrillation ([place]) 06/25/2017  . Anemia, iron deficiency 12/28/2014  . Renal lesion 05/25/2014  . Morbid obesity ([place]) 03/10/2013  . COPD (chronic obstructive pulmonary disease) ([place]) 01/22/2013  . Anemia   . Coronary artery disease   . Hyperlipidemia, mixed   . Essential hypertension, benign      Assessment/Plan HTN HLD T2DM COPD Hx of CAD S/p pacemaker placement A.Fib on eliquis - last dose yesterday, reversal agent given, s/p 1 unit PRBC Chronic Diastolic CHF/Chronic ischemic heart disease OSA on CPAP Hx of colonic polyps and moderate sigmoid diverticulosis GERD Non-toxic multinodular goiter AKI on CKD stage III - Cr 2.82, continue IVF,  limit nephrotoxic agents Obesity  Small bowel ischemia with pneumatosis S/p exploratory laparotomy with small bowel resection and cecectomy 07/21/18 Dr. Brantley Stage -POD#5 - GUR42.7, plateuing - NGT removed 7/31, post-op ileus with hiccuping and belching.  Because he claims he feels better this morning and started to pass gas, I will hold off on the nasogastric tube.  If he feels worse or vomits again, I strongly recommend he get it replaced.  Try to mobilize.  Eventually, his ileus will resolve.  Again he may require NG tube if does not turnaround.  Patient would like to hold off.  We will see.  - VAC M/W/F - OOB, mobilize with therapies.  Try and get him up more.  FEN: ice chips, sips w meds - continue NPO + ice/sips with meds and await further bowel function.  Most likely would benefit from  some diuresis with him being up almost 5 L and heart issues; but, with his chronic kidney disease, will defer to medicine to feel if it safe to do a test lasix dose.  ID: cefotetan 7/29 >>.  Can stop antibiotics from surgery standpoint.  VTE: SCD's, chemical VTE has been held 2/2 anemia (hgb 7.8).  Transfuse as needed  Diabetes.  Has been running on the low side.  Baseline long-acting insulin cut in half.  Continuing IV fluids with D5.  Defer to primary medicine service.  Foley: none    LOS: 6 days    Adin Hector , [facility] 07/27/2018, 7:36 AM Pager: [phone] Consults: [phone] Mon-Fri 7:00 am-4:30 pm Sat-Sun 7:00 am-11:30 am

## 2018-07-27 NOTE — Progress Notes (Signed)
Wound VAC leaking so changed dressing.   Wound is 18 cm x5 cm x3.5 cm, granulating with slightly more oozing of blood from the left upper outer area as compared to the image from 8/2. Pt tolerated procedure well.  Suction at 125 with no leaks.

## 2018-07-27 NOTE — Progress Notes (Signed)
ANTICOAGULATION CONSULT NOTE - Follow Up Consult  Pharmacy Consult for Heparin Indication: atrial fibrillation  Allergies  Allergen Reactions  . Bactrim [Sulfamethoxazole-Trimethoprim] Other (See Comments)    Heart to sleep, renal failure, potassium level spiked    Patient Measurements: Height: 6' (182.9 cm) Weight: 238 lb 15.7 oz (108.4 kg) IBW/kg (Calculated) : 77.6 Heparin Dosing Weight: 99.2 kg  Vital Signs: Temp: 97.8 F (36.6 C) (08/04 2003) Temp Source: Oral (08/04 2003) BP: 141/58 (08/04 2003) Pulse Rate: 77 (08/04 2003)  Labs: Recent Labs    07/25/18 0759  07/26/18 0524 07/27/18 0711 07/27/18 1906  HGB 7.8*  --  7.6* 8.0*  --   HCT 25.1*  --  24.9* 27.4*  --   PLT 328  --  331 346  --   HEPARINUNFRC  --    < > 0.32 0.17* 0.25*  CREATININE 1.82*  --  1.70* 1.49*  --    < > = values in this interval not displayed.    Estimated Creatinine Clearance: 57 mL/min (A) (by C-G formula based on SCr of 1.49 mg/dL (H)).   Medications:  Infusions:  . sodium chloride    . chlorproMAZINE (THORAZINE) IV 25 mg (07/27/18 1813)  . dextrose 5 % and 0.9% NaCl 50 mL/hr at 07/27/18 1319  . heparin 1,400 Units/hr (07/27/18 1026)  . lactated ringers    . methocarbamol (ROBAXIN) IV      Assessment: 72 year old male who has history of AVMs and on Eliquis prior to admission for atrial fibrillation was admitted 7/29 from [facility] ED due to GIB s/p reversal at [place] and transferred to [facility]. Patient was found to have small bowel ischemia with pneumatosis s/p ex lap with small bowel resection and cecectomy on 07/21/18. Surgery ok'd re-initiating IV Heparin on 8/3.   Heparin level decreased to 0.25, sub-therapeutic on 1400 units/hr. Drip was not discontinued.  Hgb up at 8, Platelets stable.  No active bleeding.  SCr trending down at 1.49. Last dose of Eliquis was 07/20/18.   Goal of Therapy:  Heparin level 0.3-0.7 units/ml Monitor platelets by anticoagulation protocol: Yes   Plan:  Increase Heparin to 1650 units/hr (no bolus). Repeat Heparin level in 8 hours.  Daily heparin level and CBC while on therapy.    Kou Gucciardo A. Levada Dy, PharmD, [place] Pager: [phone] Please utilize Amion for appropriate phone number to reach the unit pharmacist ([place])   07/27/2018,8:04 PM

## 2018-07-27 NOTE — Progress Notes (Signed)
ANTICOAGULATION CONSULT NOTE - Follow Up Consult  Pharmacy Consult for Heparin Indication: atrial fibrillation  Allergies  Allergen Reactions  . Bactrim [Sulfamethoxazole-Trimethoprim] Other (See Comments)    Heart to sleep, renal failure, potassium level spiked    Patient Measurements: Height: 6' (182.9 cm) Weight: 238 lb 15.7 oz (108.4 kg) IBW/kg (Calculated) : 77.6 Heparin Dosing Weight: 99.2 kg  Vital Signs: Temp: 98 F (36.7 C) (08/04 0429) Temp Source: Oral (08/04 0429) BP: 149/54 (08/04 0429) Pulse Rate: 71 (08/04 0429)  Labs: Recent Labs    07/25/18 0759 07/25/18 2039 07/26/18 0524 07/27/18 0711  HGB 7.8*  --  7.6* 8.0*  HCT 25.1*  --  24.9* 27.4*  PLT 328  --  331 346  HEPARINUNFRC  --  0.33 0.32 0.17*  CREATININE 1.82*  --  1.70* 1.49*    Estimated Creatinine Clearance: 57 mL/min (A) (by C-G formula based on SCr of 1.49 mg/dL (H)).   Medications:  Infusions:  . sodium chloride    . chlorproMAZINE (THORAZINE) IV    . dextrose 5 % and 0.9% NaCl 50 mL/hr at 07/27/18 0626  . heparin 1,150 Units/hr (07/27/18 0925)  . lactated ringers    . methocarbamol (ROBAXIN) IV      Assessment: 72 year old male who has history of AVMs and on Eliquis prior to admission for atrial fibrillation was admitted 7/29 from [facility] ED due to GIB s/p reversal at [place] and transferred to [facility]. Patient was found to have small bowel ischemia with pneumatosis s/p ex lap with small bowel resection and cecectomy on 07/21/18. Surgery ok'd re-initiating IV Heparin on 8/3.   Heparin level decreased to 0.17, sub-therapeutic on 1150 units/hr. Drip was not discontinued.  Hgb up at 8, Platelets stable.  No active bleeding.  SCr trending down at 1.49. Last dose of Eliquis was 07/20/18.   Goal of Therapy:  Heparin level 0.3-0.7 units/ml Monitor platelets by anticoagulation protocol: Yes   Plan:  Increase Heparin to 1400 units/hr (no bolus). Repeat Heparin level in 8 hours.  Daily  heparin level and CBC while on therapy.    Sloan Leiter, PharmD, BCPS, BCCCP Clinical Pharmacist Clinical phone 07/27/2018 until 3:30PM [phone] Please refer to [facility] for [place] numbers 07/27/2018,10:03 AM

## 2018-07-28 ENCOUNTER — Inpatient Hospital Stay (HOSPITAL_COMMUNITY): Payer: Medicare Other

## 2018-07-28 LAB — CBC
HEMATOCRIT: 26.2 % — AB (ref 39.0–52.0)
HEMOGLOBIN: 7.7 g/dL — AB (ref 13.0–17.0)
MCH: 30.2 pg (ref 26.0–34.0)
MCHC: 29.4 g/dL — AB (ref 30.0–36.0)
MCV: 102.7 fL — ABNORMAL HIGH (ref 78.0–100.0)
Platelets: 323 10*3/uL (ref 150–400)
RBC: 2.55 MIL/uL — ABNORMAL LOW (ref 4.22–5.81)
RDW: 15.3 % (ref 11.5–15.5)
WBC: 14.4 10*3/uL — ABNORMAL HIGH (ref 4.0–10.5)

## 2018-07-28 LAB — BASIC METABOLIC PANEL
ANION GAP: 7 (ref 5–15)
BUN: 29 mg/dL — AB (ref 8–23)
CHLORIDE: 112 mmol/L — AB (ref 98–111)
CO2: 28 mmol/L (ref 22–32)
Calcium: 9.1 mg/dL (ref 8.9–10.3)
Creatinine, Ser: 1.5 mg/dL — ABNORMAL HIGH (ref 0.61–1.24)
GFR calc Af Amer: 52 mL/min — ABNORMAL LOW (ref >60)
GFR calc non Af Amer: 45 mL/min — ABNORMAL LOW (ref >60)
GLUCOSE: 122 mg/dL — AB (ref 70–99)
POTASSIUM: 3.7 mmol/L (ref 3.5–5.1)
Sodium: 147 mmol/L — ABNORMAL HIGH (ref 135–145)

## 2018-07-28 LAB — MAGNESIUM: Magnesium: 2 mg/dL (ref 1.7–2.4)

## 2018-07-28 LAB — GLUCOSE, CAPILLARY
GLUCOSE-CAPILLARY: 117 mg/dL — AB (ref 70–99)
GLUCOSE-CAPILLARY: 100 mg/dL — AB (ref 70–99)
Glucose-Capillary: 104 mg/dL — ABNORMAL HIGH (ref 70–99)
Glucose-Capillary: 114 mg/dL — ABNORMAL HIGH (ref 70–99)
Glucose-Capillary: 129 mg/dL — ABNORMAL HIGH (ref 70–99)
Glucose-Capillary: 130 mg/dL — ABNORMAL HIGH (ref 70–99)

## 2018-07-28 LAB — PREPARE RBC (CROSSMATCH)

## 2018-07-28 LAB — HEMOGLOBIN AND HEMATOCRIT, BLOOD
HEMATOCRIT: 29.5 % — AB (ref 39.0–52.0)
Hemoglobin: 9.1 g/dL — ABNORMAL LOW (ref 13.0–17.0)

## 2018-07-28 LAB — HEPARIN LEVEL (UNFRACTIONATED): Heparin Unfractionated: 0.46 IU/mL (ref 0.30–0.70)

## 2018-07-28 MED ORDER — SODIUM CHLORIDE 0.9% IV SOLUTION
Freq: Once | INTRAVENOUS | Status: AC
  Administered 2018-07-28: 12:00:00 via INTRAVENOUS

## 2018-07-28 NOTE — Care Management Note (Addendum)
Case Management Note  Patient Details  Name: RHYLAND HINDERLITER MRN: [id number] Date of Birth: 06/17/1946  Subjective/Objective:                    Action/Plan:Home VAC to be delivered today per Konrad Dolores at [facility] referral for RN and PT given to [facility] at [facility] phone [phone] fax [phone]. Tillie Rung will need update closer to discharge.   Home VAC not in room. Called [facility] , awaiting call back.   Expected Discharge Date:                  Expected Discharge Plan:  [place]  In-House Referral:     Discharge planning Services  CM Consult  Post Acute Care Choice:  Home Health, Durable Medical Equipment Choice offered to:  Spouse, Patient  DME Arranged:  Vac DME Agency:  [facility]  HH Arranged:  RN, PT [place] Agency:  [facility]  Status of Service:  In process, will continue to follow  If discussed at Long Length of Stay Meetings, dates discussed:    Additional Comments:  Marilu Favre, RN 07/28/2018, 11:38 AM

## 2018-07-28 NOTE — Progress Notes (Signed)
ANTICOAGULATION CONSULT NOTE - Follow Up Consult  Pharmacy Consult for Heparin Indication: atrial fibrillation  Allergies  Allergen Reactions  . Bactrim [Sulfamethoxazole-Trimethoprim] Other (See Comments)    Heart to sleep, renal failure, potassium level spiked    Patient Measurements: Height: 6' (182.9 cm) Weight: 233 lb 0.4 oz (105.7 kg) IBW/kg (Calculated) : 77.6 Heparin Dosing Weight: 99.2 kg  Vital Signs: Temp: 98.2 F (36.8 C) (08/05 0402) Temp Source: Oral (08/05 0402) BP: 128/57 (08/05 0402) Pulse Rate: 76 (08/05 0402)  Labs: Recent Labs    07/26/18 0524 07/27/18 0711 07/27/18 1906 07/28/18 0420  HGB 7.6* 8.0*  --  7.7*  HCT 24.9* 27.4*  --  26.2*  PLT 331 346  --  323  HEPARINUNFRC 0.32 0.17* 0.25* 0.46  CREATININE 1.70* 1.49*  --  1.50*    Estimated Creatinine Clearance: 55.9 mL/min (A) (by C-G formula based on SCr of 1.5 mg/dL (H)).   Medications:  Infusions:  . sodium chloride    . chlorproMAZINE (THORAZINE) IV 25 mg (07/27/18 1813)  . dextrose 5 % and 0.9% NaCl 50 mL/hr at 07/28/18 0142  . heparin 1,650 Units/hr (07/28/18 0212)  . lactated ringers    . methocarbamol (ROBAXIN) IV      Assessment: 72 year old male who has history of AVMs and on Eliquis prior to admission for atrial fibrillation was admitted 7/29 from [facility] ED due to GIB s/p reversal at [place] and transferred to [facility]. Patient was found to have small bowel ischemia with pneumatosis s/p ex lap with small bowel resection and cecectomy on 07/21/18. Surgery ok'd re-initiating IV Heparin on 8/3.   Heparin level therapeutic this AM   Goal of Therapy:  Heparin level 0.3-0.7 units/ml Monitor platelets by anticoagulation protocol: Yes   Plan:  Continue heparin at 1650 units / hr Daily heparin level and CBC while on therapy.    Thank you Anette Guarneri, PharmD [phone] Please utilize Amion for appropriate phone number to reach the unit pharmacist ([place])   07/28/2018,11:45 AM

## 2018-07-28 NOTE — Progress Notes (Signed)
Initial Nutrition Assessment  DOCUMENTATION CODES:   Obesity unspecified  INTERVENTION:   -RD will follow for diet advancement and supplement as appropriate -If prolonged NPO/clear liquid diet status is anticipated, consider initiation of nutrition support  NUTRITION DIAGNOSIS:   Inadequate oral intake related to altered GI function as evidenced by NPO status.  GOAL:   Patient will meet greater than or equal to 90% of their needs  MONITOR:   Diet advancement, PO intake, Labs, Weight trends, Skin, I & O's  REASON FOR ASSESSMENT:   NPO/Clear Liquid Diet    ASSESSMENT:   72 year old male with PMH of AVM and Diverticulosis on Eliquis presents to ED with dark tarry stools with nausea and ABD pain.    7/29- NGT placed; ischemic small bowel per GI; s/p exploratory laparotomy small bowel resection and cecectomy  7/31- NGT removed, wound vac applied to midline abdominal surgical wound 8/5- NGT placed  Case discussed with RN prior to visit. Pt is tolerating sips of gingerale well. Per RN, if pt does well, NGT may be d/c today. She suspects pt will be advanced to clear liquids tomorrow. Pt has had 3 BMs today and will receive a blood transfusion later on this afternoon.   Spoke with pt and wife at bedside, who report that pt has progressively lost weight over the past years related to lifestyle changes to help manage DM. Pt consumes 3 meals per day, which usually consist of fish and vegetables. Pt reports UBW is around 220#. He is very active and typically foes swimming at the [facility] 3-4 times per week.   Pt very encouraged by his progress and is eager to get NGT out, however, understanding of purpose of NGT. Discussed potential diet progression with pt and wife. They currently have no further questions regarding DM or nutrition at this time.  Last Hgb A1c: 7.4 (07/25/18). PTA DM medications are 2-10 units insulin glargine as need (BS >110 per sliding scale).   Labs reviewed:Na: 147, K  and Mg WDL. CBGS: 104-117 (inpatient orders for glycemic control are 0-20 units insulin aspart every 4 hours and units insulin detemir every 12 hours).   NUTRITION - FOCUSED PHYSICAL EXAM:    Most Recent Value  Orbital Region  No depletion  Upper Arm Region  No depletion  Thoracic and Lumbar Region  No depletion  Buccal Region  No depletion  Temple Region  No depletion  Clavicle Bone Region  No depletion  Clavicle and Acromion Bone Region  No depletion  Scapular Bone Region  No depletion  Dorsal Hand  No depletion  Patellar Region  No depletion  Anterior Thigh Region  No depletion  Posterior Calf Region  No depletion  Edema (RD Assessment)  None  Hair  Reviewed  Eyes  Reviewed  Mouth  Reviewed  Skin  Reviewed  Nails  Reviewed       Diet Order:   Diet Order           Diet NPO time specified Except for: Ice Chips, Sips with Meds, Other (See Comments)  Diet effective now          EDUCATION NEEDS:   Education needs have been addressed  Skin:  Skin Assessment: Skin Integrity Issues: Skin Integrity Issues:: Wound VAC Wound Vac: abdominal incision  Last BM:  07/28/18  Height:   Ht Readings from Last 1 Encounters:  07/21/18 6' (1.829 m)    Weight:   Wt Readings from Last 1 Encounters:  07/28/18 233  lb 0.4 oz (105.7 kg)    Ideal Body Weight:  80.9 kg  BMI:  Body mass index is 31.6 kg/m.  Estimated Nutritional Needs:   Kcal:  2200-2400  Protein:  120-135 grams  Fluid:  >2.2 L    Shahin Knierim A. Jimmye Norman, RD, LDN, CDE Pager: [phone] After hours Pager: [phone]

## 2018-07-28 NOTE — Progress Notes (Signed)
Patient refused CPAP HS tonight 

## 2018-07-28 NOTE — Progress Notes (Signed)
PROGRESS NOTE    Lawrence [name]  DTO:[id number] DOB: 07/15/1946 DOA: 07/21/2018 PCP: [name], MD  Brief NarrativePatient was admitted by critical care service on 07/21/2018, per admission note by Ms. Eubanks 72 year old male with PMH of COPD, DM, HLD, HTN, CAD, S/P Pacemaker, A.Fib on Eliquis, Diastolic HF, OSA on CPAP at HS, H/O AVM Colonic Polys and Moderate Sigmoid Diverticulosis, GERD.  Presents to [place] ED on 7/29 with Abdominal Pain, Nausea, and reportedly two days of Hematocheziain which appeared black/tarry. Hemoglobin 8.8. Given 1 unit RBC at OSH. Eliquis reversed with Anti-Inhibitor Coagulant Complex. Patient transferred to [facility] for Further Evaluation.  On arrival to ICU patient is alert and oriented with systolic 983. On insulin gtt. Complaining of abdominal tenderness and nausea.  Patient was admitted to ICU, found to have small bowel ischemia/pneumatosis, hematochezia, underwent exploratory laparotomy with small bowel resection and seek cecectomy on 07/21/2018 (Dr Brantley Stage). He was extubated on 7/30. Patient was transferred to hospitalist service on 8/1 and [facility] assumed care on 8/1.     Assessment & Plan:   Principal Problem:   Small bowel ischemia w pneumatosis s/p ileocectomy 07/21/2018 Active Problems:   COPD (chronic obstructive pulmonary disease) (HCC)   Morbid obesity (HCC)   CHF (congestive heart failure), NYHA class II, chronic, diastolic (HCC)   GI bleed   CKD (chronic kidney disease) stage 3, GFR 30-59 ml/min (HCC)   Small bowel ischemia/pneumatosis with hematochezia -Patient was seen by GI and general surgery. -Patient underwent exploratory laparotomy with small bowel resection and cecectomy on 07/21/2018, postop day #6 -Status post wound VAC on 7/31, NGT removed on 7/31, surgery following, management per general surgery - complaining of"heartburn", placed on GI cocktail 3 times daily as needed, he states is not helping at all. increase  Protonix to every 12 hours -Still complaining of burping and heartburn GI cocktail has not helped. Not able to pass gas yet. Encouraged him to ambulate in the hallway with the staff. -Check KUB  NGT placed back 8/4 Mild increase in wbc Follow.  Active problems Acute back pain/fall -s/pfalling from the bedon 8/2night while transferring from the ICU.Back pain improving today -X-ray of the thoracic and lumbar spinedegenerative joint disease no acute findings. -PT eval recommended home health PT, continue Lidoderm patch, pain control  Acute blood loss anemia, iron deficiency -Likely due to #1, anticoagulation currently on hold -Baseline hemoglobin 10-11, hemoglobin 7.7on 8/5//2019 Arrange blood transfusion  Diabetes mellitus, type II -Diabetic coordinator following,decreasing the lowest dose of Levemir due to decreased blood sugar and patient being n.p.o. -Hemoglobin A1c 7.4 -Continue 550 cc an hour until he is able to tolerate p.o. intake.  History of diastolic CHF, CAD -Currently compensated, follow I's and O's closely. Net +5 L  - PTA patient was on Lasix and metolazone, currently n.p.o. we will give him 1 dose of Lasix 40 mg x 1.  Reassess tomorrow. DC ZOFRAN QT INTERVAL prolonged to 523 compared to yesterday.  Paroxysmal atrial fibrillation, pacemaker -Anticoagulation was held due to #1 -Cleared by general surgery to start anti-coagulationwith IV heparin drip 07/25/2018 -Was on Eliquis at home which needs to be restarted prior to discharge. -Start Eliquis once cleared by surgery.  Hypernatremia -mild increase in creatinine may be due to lasix one dose given yesterday with npo status.follow.     DVT prophylaxis:heparin Code Status:full Family Communication:none today Disposition Plan:tbd Consultants:  surgery  Procedures: Exploratory laparotomy small bowel resection and Caecoctomy   Antimicrobials none Subjective:  Sitting up in chair had to place  ng tube back due to vomiting,had few BMS and passing gas still on ice chips..  Objective: Vitals:   07/28/18 0402 07/28/18 0600 07/28/18 1218 07/28/18 1242  BP: (!) 128/57  130/61 (!) 135/58  Pulse: 76  (!) 108 73  Resp: 16  (!) 22 18  Temp: 98.2 F (36.8 C)  98.4 F (36.9 C) 97.8 F (36.6 C)  TempSrc: Oral  Oral Oral  SpO2: 94%   98%  Weight:  105.7 kg (233 lb 0.4 oz)    Height:        Intake/Output Summary (Last 24 hours) at 07/28/2018 1541 Last data filed at 07/28/2018 1233 Gross per 24 hour  Intake 1785.65 ml  Output 2860 ml  Net -1074.35 ml   Filed Weights   07/24/18 0500 07/25/18 0353 07/28/18 0600  Weight: 105.7 kg (233 lb) 108.4 kg (238 lb 15.7 oz) 105.7 kg (233 lb 0.4 oz)    Examination:  General exam: Appears calm and comfortable  Respiratory system: Clear to auscultation. Respiratory effort normal. Cardiovascular system: S1 & S2 heard, RRR. No JVD, murmurs, rubs, gallops or clicks. No pedal edema. Gastrointestinal system: Abdomen is  distended, soft and tender. No organomegaly or masses felt. Normal bowel sounds heard. Central nervous system: Alert and oriented. No focal neurological deficits. Extremities: Symmetric 5 x 5 power. Skin: No rashes, lesions or ulcers Psychiatry: Judgement and insight appear normal. Mood & affect appropriate.     Data Reviewed: I have personally reviewed following labs and imaging studies  CBC: Recent Labs  Lab 07/24/18 1125 07/25/18 0759 07/26/18 0524 07/27/18 0711 07/28/18 0420  WBC 14.6* 12.5* 12.6* 13.6* 14.4*  HGB 7.8* 7.8* 7.6* 8.0* 7.7*  HCT 25.5* 25.1* 24.9* 27.4* 26.2*  MCV 100.4* 101.2* 101.2* 104.2* 102.7*  PLT 344 328 331 346 564   Basic Metabolic Panel: Recent Labs  Lab 07/24/18 0537 07/25/18 0759 07/26/18 0524 07/27/18 0711 07/28/18 0420  NA 143 141 143 145 147*  K 4.0 3.9 4.1 3.7 3.7  CL 107 106 110 112* 112*  CO2 23 24 26 23 28   GLUCOSE 128* 118* 93 122* 122*  BUN 86* 62* 51* 36* 29*    CREATININE 2.25* 1.82* 1.70* 1.49* 1.50*  CALCIUM 9.2 9.0 9.0 9.0 9.1  MG 2.3  --   --   --  2.0  PHOS 4.6  --   --   --   --    GFR: Estimated Creatinine Clearance: 55.9 mL/min (A) (by C-G formula based on SCr of 1.5 mg/dL (H)). Liver Function Tests: No results for input(s): AST, ALT, ALKPHOS, BILITOT, PROT, ALBUMIN in the last 168 hours. No results for input(s): LIPASE, AMYLASE in the last 168 hours. No results for input(s): AMMONIA in the last 168 hours. Coagulation Profile: No results for input(s): INR, PROTIME in the last 168 hours. Cardiac Enzymes: No results for input(s): CKTOTAL, CKMB, CKMBINDEX, TROPONINI in the last 168 hours. BNP (last 3 results) Recent Labs    01/31/18 1101 03/04/18 1025 05/16/18 1100  PROBNP 6,793* 7,996* 3,272*   HbA1C: No results for input(s): HGBA1C in the last 72 hours. CBG: Recent Labs  Lab 07/27/18 2003 07/28/18 0017 07/28/18 0400 07/28/18 0821 07/28/18 1244  GLUCAP 118* 130* 114* 104* 117*   Lipid Profile: No results for input(s): CHOL, HDL, LDLCALC, TRIG, CHOLHDL, LDLDIRECT in the last 72 hours. Thyroid Function Tests: No results for input(s): TSH, T4TOTAL, FREET4, T3FREE, THYROIDAB in the last 72 hours.  Anemia Panel: No results for input(s): VITAMINB12, FOLATE, FERRITIN, TIBC, IRON, RETICCTPCT in the last 72 hours. Sepsis Labs: No results for input(s): PROCALCITON, LATICACIDVEN in the last 168 hours.  Recent Results (from the past 240 hour(s))  Culture, blood (routine x 2)     Status: None   Collection Time: 07/21/18  4:35 AM  Result Value Ref Range Status   Specimen Description BLOOD LEFT ARM  Final   Special Requests   Final    BOTTLES DRAWN AEROBIC ONLY Blood Culture adequate volume   Culture   Final    NO GROWTH 5 DAYS Performed at [facility], [address], [place], [place] [address]    Report Status 07/26/2018 FINAL  Final  Culture, blood (routine x 2)     Status: None   Collection Time: 07/21/18  4:40 AM   Result Value Ref Range Status   Specimen Description BLOOD LEFT ARM  Final   Special Requests   Final    BOTTLES DRAWN AEROBIC ONLY Blood Culture adequate volume   Culture   Final    NO GROWTH 5 DAYS Performed at [facility], [place] [address], [place], [place] [address]    Report Status 07/26/2018 FINAL  Final  MRSA PCR Screening     Status: None   Collection Time: 07/21/18  4:49 AM  Result Value Ref Range Status   MRSA by PCR NEGATIVE NEGATIVE Final    Comment:        The GeneXpert MRSA Assay (FDA approved for NASAL specimens only), is one component of a comprehensive MRSA colonization surveillance program. It is not intended to diagnose MRSA infection nor to guide or monitor treatment for MRSA infections. Performed at [facility], [place] [address] [place], [place] [address]   Surgical pcr screen     Status: None   Collection Time: 07/21/18 10:37 AM  Result Value Ref Range Status   MRSA, PCR NEGATIVE NEGATIVE Final   Staphylococcus aureus NEGATIVE NEGATIVE Final    Comment: (NOTE) The Xpert SA Assay (FDA approved for NASAL specimens in patients 58 years of age and older), is one component of a comprehensive surveillance program. It is not intended to diagnose infection nor to guide or monitor treatment. Performed at [facility], [place] [address] [place], [place] [address]          Radiology Studies: Dg Abd 1 View  Result Date: 07/27/2018 CLINICAL DATA:  Ileus EXAM: ABDOMEN - 1 VIEW COMPARISON:  07/25/2018 FINDINGS: Scattered large and small bowel gas is noted. Persistent small bowel dilatation is identified. Some colonic gas is seen in these changes may represent a partial small bowel obstruction. Degenerative changes of the lumbar spine are noted. No new focal abnormality is seen. IMPRESSION: Diffuse small-bowel dilatation likely related to a partial small bowel obstruction. Clinical correlation is recommended. Electronically Signed   By: Inez Catalina M.D.   On: 07/27/2018 15:26   Dg Abd Portable 1v  Result Date: 07/28/2018 CLINICAL DATA:  Nasogastric tube placement. Recent partial bowel resection EXAM: PORTABLE ABDOMEN - 1 VIEW COMPARISON:  July 27, 2018 FINDINGS: Nasogastric tube tip and side port are in the stomach. There remain multiple loops of dilated small bowel without appreciable air-fluid levels. Air is seen and portions of the colon. No appreciable free air. IMPRESSION: Nasogastric tube tip and side port in stomach. Diffuse bowel dilatation remains. A degree of bowel obstruction cannot be excluded, although postoperative ileus may present  in this manner. No free air demonstrable on this supine examination. Electronically Signed   By: Lowella Grip III M.D.   On: 07/28/2018 07:38        Scheduled Meds: . acetaminophen  1,000 mg Oral TID  . bisacodyl  10 mg Rectal Daily  . chlorhexidine  15 mL Mouth Rinse BID  . Chlorhexidine Gluconate Cloth  6 each Topical Daily  . insulin aspart  0-20 Units Subcutaneous Q4H  . insulin detemir  6 Units Subcutaneous Q12H  . lidocaine  1 patch Transdermal Q24H  . lip balm  1 application Topical BID  . mouth rinse  15 mL Mouth Rinse q12n4p  . pantoprazole  40 mg Intravenous Q12H  . simethicone  40 mg Oral TID AC & HS  . sodium chloride flush  10-40 mL Intracatheter Q12H   Continuous Infusions: . sodium chloride    . chlorproMAZINE (THORAZINE) IV 25 mg (07/27/18 1813)  . dextrose 5 % and 0.9% NaCl 50 mL/hr at 07/28/18 0142  . heparin 1,650 Units/hr (07/28/18 0212)  . lactated ringers    . methocarbamol (ROBAXIN) IV       LOS: 7 days     Georgette Shell, MD [facility]  If 7PM-7AM, please contact night-coverage [email or web address] Password [email or web address] 07/28/2018, 3:41 PM

## 2018-07-28 NOTE — Progress Notes (Signed)
[facility] Progress Note  7 Days Post-Op  Subjective: CC: ileus Patient had NGT reinserted overnight. This AM reports increase in flatus and 2 small BMs. BMs dark black - pt has been having GI bleeding for several years. Denies nausea, wants something to drink. Pain improving.   Objective: Vital signs in last 24 hours: Temp:  [97.8 F (36.6 C)-98.5 F (36.9 C)] 98.2 F (36.8 C) (08/05 0402) Pulse Rate:  [74-77] 76 (08/05 0402) Resp:  [16] 16 (08/05 0402) BP: (128-145)/(57-60) 128/57 (08/05 0402) SpO2:  [94 %-97 %] 94 % (08/05 0402) Weight:  [105.7 kg (233 lb 0.4 oz)] 105.7 kg (233 lb 0.4 oz) (08/05 0600) Last BM Date: 07/27/18  Intake/Output from previous day: 08/04 0701 - 08/05 0700 In: 1509.5 [I.V.:1234.5; IV Piggyback:275] Out: 3160 [Urine:790; Emesis/NG output:2350; Drains:20] Intake/Output this shift: No intake/output data recorded.  PE: Gen:  Alert, NAD, pleasant Card:  Regular rate and rhythm, pedal pulses 2+ BL Pulm:  Normal effort, clear to auscultation bilaterally Abd: Soft, non-tender, non-distended, bowel sounds present, VAC to midline wound Skin: warm and dry, no rashes; pale Psych: A&Ox3   Lab Results:  Recent Labs    07/27/18 0711 07/28/18 0420  WBC 13.6* 14.4*  HGB 8.0* 7.7*  HCT 27.4* 26.2*  PLT 346 323   BMET Recent Labs    07/27/18 0711 07/28/18 0420  NA 145 147*  K 3.7 3.7  CL 112* 112*  CO2 23 28  GLUCOSE 122* 122*  BUN 36* 29*  CREATININE 1.49* 1.50*  CALCIUM 9.0 9.1   PT/INR No results for input(s): LABPROT, INR in the last 72 hours. CMP     Component Value Date/Time   NA 147 (H) 07/28/2018 0420   NA 137 05/16/2018 1100   NA 139 03/29/2015 0926   K 3.7 07/28/2018 0420   K 4.4 03/29/2015 0926   CL 112 (H) 07/28/2018 0420   CL 108 (H) 06/09/2013 1218   CO2 28 07/28/2018 0420   CO2 24 03/29/2015 0926   GLUCOSE 122 (H) 07/28/2018 0420   GLUCOSE 239 (H) 03/29/2015 0926   GLUCOSE 235 (H) 06/09/2013 1218   BUN  29 (H) 07/28/2018 0420   BUN 59 (H) 05/16/2018 1100   BUN 25.8 03/29/2015 0926   CREATININE 1.50 (H) 07/28/2018 0420   CREATININE 1.2 03/29/2015 0926   CALCIUM 9.1 07/28/2018 0420   CALCIUM 8.9 03/29/2015 0926   PROT 6.7 07/21/2018 0444   PROT 6.6 12/18/2017 1443   PROT 6.5 03/29/2015 0926   ALBUMIN 3.3 (L) 07/21/2018 0444   ALBUMIN 4.1 12/18/2017 1443   ALBUMIN 3.5 03/29/2015 0926   AST 49 (H) 07/21/2018 0444   AST 20 03/29/2015 0926   ALT 33 07/21/2018 0444   ALT 17 03/29/2015 0926   ALKPHOS 239 (H) 07/21/2018 0444   ALKPHOS 136 03/29/2015 0926   BILITOT 1.0 07/21/2018 0444   BILITOT 0.8 12/18/2017 1443   BILITOT 0.63 03/29/2015 0926   GFRNONAA 45 (L) 07/28/2018 0420   GFRAA 52 (L) 07/28/2018 0420   Lipase     Component Value Date/Time   LIPASE 60 (H) 07/21/2018 0444       Studies/Results: Dg Abd 1 View  Result Date: 07/27/2018 CLINICAL DATA:  Ileus EXAM: ABDOMEN - 1 VIEW COMPARISON:  07/25/2018 FINDINGS: Scattered large and small bowel gas is noted. Persistent small bowel dilatation is identified. Some colonic gas is seen in these changes may represent a partial small bowel obstruction. Degenerative changes of the lumbar spine  are noted. No new focal abnormality is seen. IMPRESSION: Diffuse small-bowel dilatation likely related to a partial small bowel obstruction. Clinical correlation is recommended. Electronically Signed   By: Inez Catalina M.D.   On: 07/27/2018 15:26   Dg Abd Portable 1v  Result Date: 07/28/2018 CLINICAL DATA:  Nasogastric tube placement. Recent partial bowel resection EXAM: PORTABLE ABDOMEN - 1 VIEW COMPARISON:  July 27, 2018 FINDINGS: Nasogastric tube tip and side port are in the stomach. There remain multiple loops of dilated small bowel without appreciable air-fluid levels. Air is seen and portions of the colon. No appreciable free air. IMPRESSION: Nasogastric tube tip and side port in stomach. Diffuse bowel dilatation remains. A degree of bowel  obstruction cannot be excluded, although postoperative ileus may present in this manner. No free air demonstrable on this supine examination. Electronically Signed   By: Lowella Grip III M.D.   On: 07/28/2018 07:38    Anti-infectives: Anti-infectives (From admission, onward)   Start     Dose/Rate Route Frequency Ordered Stop   07/21/18 1315  cefoTEtan (CEFOTAN) 2 g in sodium chloride 0.9 % 100 mL IVPB  Status:  Discontinued     2 g 200 mL/hr over 30 Minutes Intravenous Every 12 hours 07/21/18 1309 07/25/18 1220   07/21/18 1315  cefoTEtan (CEFOTAN) 2 g in sodium chloride 0.9 % 100 mL IVPB     2 g 200 mL/hr over 30 Minutes Intravenous To Surgery 07/21/18 1312 07/21/18 1210       Assessment/Plan HTN HLD T2DM - Has been running on the low side.  Baseline long-acting insulin cut in half. Continuing  IV fluids withD5.  Defer to primary medicine service. COPD Hx of CAD S/p pacemaker placement A.Fib on eliquis - last dose yesterday, reversal agent given, s/p 1 unit PRBC Chronic Diastolic CHF/Chronic ischemic heart disease OSA on CPAP Hx of colonic polyps and moderate sigmoid diverticulosis GERD Non-toxic multinodular goiter AKI on CKD stage III - Cr 1.5, continue IVF, consider diuresis Obesity - BMI 31.6 ABL anemia - H/H 7.7/26.2, fairly stable but somewhat symptomatic, transfuse 1 unit PRBC  Small bowel ischemia with pneumatosis S/p exploratory laparotomy with small bowel resection and cecectomy 07/21/18 Dr. Brantley Stage Post-operative ileus -POD#7 - YOV78.5, afebrile - will repeat CT scan tomorrow if continuing to trend up and ileus not improving  - NGTwith 1,850 cc out overnight, pt passing flatus and had 2 BM - will clamp NGT and allow sips of clears  - VACchanged yesterday for leaking, can switch to TTS dressing changes -OOB, mobilize with therapies.  Try and get him up more.  YIF:OYDXA NGT, allow sips of clears ID: [email or web address] 7/29 >8/2 VTE: SCD's, heparin gtt - can  restart eliquis when tolerating solids Foley: none Follow up: Dr. Brantley Stage    LOS: 7 days    Brigid Re , [facility] 07/28/2018, 9:03 AM Pager: [phone] Consults: [phone] Mon-Fri 7:00 am-4:30 pm Sat-Sun 7:00 am-11:30 am

## 2018-07-29 ENCOUNTER — Inpatient Hospital Stay (HOSPITAL_COMMUNITY): Payer: Medicare Other

## 2018-07-29 LAB — BASIC METABOLIC PANEL
Anion gap: 12 (ref 5–15)
BUN: 24 mg/dL — ABNORMAL HIGH (ref 8–23)
CALCIUM: 8.8 mg/dL — AB (ref 8.9–10.3)
CO2: 24 mmol/L (ref 22–32)
Chloride: 106 mmol/L (ref 98–111)
Creatinine, Ser: 1.39 mg/dL — ABNORMAL HIGH (ref 0.61–1.24)
GFR, EST AFRICAN AMERICAN: 57 mL/min — AB (ref >60)
GFR, EST NON AFRICAN AMERICAN: 49 mL/min — AB (ref >60)
GLUCOSE: 105 mg/dL — AB (ref 70–99)
POTASSIUM: 3.5 mmol/L (ref 3.5–5.1)
Sodium: 142 mmol/L (ref 135–145)

## 2018-07-29 LAB — CBC
HEMATOCRIT: 28.1 % — AB (ref 39.0–52.0)
Hemoglobin: 8.9 g/dL — ABNORMAL LOW (ref 13.0–17.0)
MCH: 30.7 pg (ref 26.0–34.0)
MCHC: 31.7 g/dL (ref 30.0–36.0)
MCV: 96.9 fL (ref 78.0–100.0)
Platelets: 295 10*3/uL (ref 150–400)
RBC: 2.9 MIL/uL — AB (ref 4.22–5.81)
RDW: 17.2 % — AB (ref 11.5–15.5)
WBC: 15.2 10*3/uL — ABNORMAL HIGH (ref 4.0–10.5)

## 2018-07-29 LAB — HEPARIN LEVEL (UNFRACTIONATED): HEPARIN UNFRACTIONATED: 0.36 [IU]/mL (ref 0.30–0.70)

## 2018-07-29 LAB — BPAM RBC
Blood Product Expiration Date: 201908122359
ISSUE DATE / TIME: 201908051224
Unit Type and Rh: 5100

## 2018-07-29 LAB — TYPE AND SCREEN
ABO/RH(D): O POS
Antibody Screen: NEGATIVE
Unit division: 0

## 2018-07-29 LAB — GLUCOSE, CAPILLARY
GLUCOSE-CAPILLARY: 100 mg/dL — AB (ref 70–99)
GLUCOSE-CAPILLARY: 106 mg/dL — AB (ref 70–99)
Glucose-Capillary: 105 mg/dL — ABNORMAL HIGH (ref 70–99)
Glucose-Capillary: 100 mg/dL — ABNORMAL HIGH (ref 70–99)
Glucose-Capillary: 113 mg/dL — ABNORMAL HIGH (ref 70–99)
Glucose-Capillary: 130 mg/dL — ABNORMAL HIGH (ref 70–99)

## 2018-07-29 MED ORDER — TRAMADOL HCL 50 MG PO TABS: 50.0000 mg | ORAL_TABLET | Freq: Four times a day (QID) | ORAL | Status: DC | PRN

## 2018-07-29 MED ORDER — METHOCARBAMOL 500 MG PO TABS
1000.0000 mg | ORAL_TABLET | Freq: Three times a day (TID) | ORAL | Status: DC
  Administered 2018-07-29 – 2018-07-31 (×5): 1000 mg via ORAL
  Filled 2018-07-29 (×6): qty 2

## 2018-07-29 MED ORDER — OXYCODONE HCL 5 MG PO TABS: 5.0000 mg | ORAL_TABLET | ORAL | Status: DC | PRN

## 2018-07-29 NOTE — Progress Notes (Signed)
PROGRESS NOTE    Lawrence [name]  WUJ:[id number] DOB: 1946/06/30 DOA: 07/21/2018 PCP: [name], MD   Brief Narrative:Patient was admitted by critical care service on 07/21/2018, per admission note by Ms. Eubanks 72 year old male with PMH of COPD, DM, HLD, HTN, CAD, S/P Pacemaker, A.Fib on Eliquis, Diastolic HF, OSA on CPAP at HS, H/O AVM Colonic Polys and Moderate Sigmoid Diverticulosis, GERD.  Presents to [facility] ED on 7/29 with Abdominal Pain, Nausea, and reportedly two days of Hematocheziain which appeared black/tarry. Hemoglobin 8.8. Given 1 unit RBC at OSH. Eliquis reversed with Anti-Inhibitor Coagulant Complex. Patient transferred to [facility] for Further Evaluation.  On arrival to ICU patient is alert and oriented with systolic 956. On insulin gtt. Complaining of abdominal tenderness and nausea.  Patient was admitted to ICU, found to have small bowel ischemia/pneumatosis, hematochezia, underwent exploratory laparotomy with small bowel resection and seek cecectomy on 07/21/2018 (Dr Brantley Stage). He was extubated on 7/30. Patient was transferred to hospitalist service on 8/1 and [facility] assumed care on 8/1.    Assessment & Plan:   Principal Problem:   Small bowel ischemia w pneumatosis s/p ileocectomy 07/21/2018 Active Problems:   COPD (chronic obstructive pulmonary disease) (HCC)   Morbid obesity (HCC)   CHF (congestive heart failure), NYHA class II, chronic, diastolic (HCC)   GI bleed   CKD (chronic kidney disease) stage 3, GFR 30-59 ml/min (HCC) Small bowel ischemia/pneumatosis with hematochezia -Patient was seen by GI and general surgery. -Patient underwent exploratory laparotomy with small bowel resection and cecectomy on 07/21/2018, postop day #6 -Status post wound VAC on 7/31, NGT removed on 7/31, surgery following, management per general surgery - complaining of"heartburn", placed on GI cocktail 3 times daily as needed,he states is not helping at all.increase  Protonix to every 12 hours -Still complaining of burping and heartburn GI cocktail has not helped. Not able to pass gas yet. Encouraged him to ambulate in the hallway with the staff. - KUB 8/4 cw ileus NGT placed back 8/4,ngt dc 8/6 Mild increase in wbc  Follow ua and chest xray -Diet to be advanced by surgery today.  Active problems Acute back pain/fall -s/pfalling from the bedon 8/2night while transferring from the ICU.Back pain improving today -X-ray of the thoracic and lumbar spinedegenerative joint disease no acute findings. -PT eval recommended home health PT, continue Lidoderm patch, pain control   Acute blood loss anemia, iron deficiency -Likely due to #1, anticoagulation currently on hold -Baseline hemoglobin 10-11, hemoglobin7.7on 8/5//2019 He received a unit of blood transfusion 07/29/2018 has gone up to 8.9 from 7.7.   Diabetes mellitus, type II -Diabetic coordinator following,decreasing the lowest dose of Levemir due to decreased blood sugar and patient being n.p.o. -Hemoglobin A1c 7.4   History of diastolic CHF, CAD -Currently compensated, follow I's and O's closely. Net +5L  - PTA patient was on Lasix and metolazone, please restart diuretics upon discharge or prior to discharge once he is able to start taking p.o. effectively.  He received a dose of Lasix 07/28/2018. . DC ZOFRAN QT INTERVAL prolonged to 523 compared to yesterday.  Paroxysmal atrial fibrillation, pacemaker -Anticoagulation was held due to #1 -Cleared by general surgery to start anti-coagulationwith IV heparin drip 07/25/2018 -Was on Eliquis at home which needs to be restarted prior to discharge. -Start Eliquis once he starts taking solid food per surgery.  Hypernatremia -mild increase in creatinine may be due to lasix one dose given yesterday with npo status.follow.  DVT prophylaxis: Heparin Code Status: Full code Family Communication: No family available  today Disposition Plan: DC home when okay with surgery Consultants: Surgery  Procedures: Exploratory laparotomy and small bowel resection and cecostomy. Antimicrobials:  None Subjective: He is sitting up in chair awake alert smiling had huge bowel movement this morning and passing gas much more frequently than yesterday.  No nausea vomiting.  Denies any urinary complaints cough chest pain shortness of breath fever or chills.  Objective: Vitals:   07/28/18 1623 07/28/18 2027 07/29/18 0419 07/29/18 0500  BP: (!) 141/53 135/60 135/61   Pulse: 73 77 69   Resp: 16 18 18    Temp: 98.4 F (36.9 C) 97.8 F (36.6 C) 98.1 F (36.7 C)   TempSrc: Oral Oral Oral   SpO2: 95% 92% 96%   Weight:    110 kg (242 lb 8.1 oz)  Height:        Intake/Output Summary (Last 24 hours) at 07/29/2018 1151 Last data filed at 07/29/2018 0618 Gross per 24 hour  Intake 2946.08 ml  Output 800 ml  Net 2146.08 ml   Filed Weights   07/25/18 0353 07/28/18 0600 07/29/18 0500  Weight: 108.4 kg (238 lb 15.7 oz) 105.7 kg (233 lb 0.4 oz) 110 kg (242 lb 8.1 oz)    Examination:  General exam: Appears calm and comfortable  Respiratory system: Clear to auscultation. Respiratory effort normal. Cardiovascular system: S1 & S2 heard, RRR. No JVD, murmurs, rubs, gallops or clicks. No pedal edema. Gastrointestinal system: Abdomen is nondistended, soft and nontender. No organomegaly or masses felt.  bowel sounds better Central nervous system: Alert and oriented. No focal neurological deficits. Extremities: Symmetric 5 x 5 power. Skin: No rashes, lesions or ulcers Psychiatry: Judgement and insight appear normal. Mood & affect appropriate.     Data Reviewed: I have personally reviewed following labs and imaging studies  CBC: Recent Labs  Lab 07/25/18 0759 07/26/18 0524 07/27/18 0711 07/28/18 0420 07/28/18 1836 07/29/18 0503  WBC 12.5* 12.6* 13.6* 14.4*  --  15.2*  HGB 7.8* 7.6* 8.0* 7.7* 9.1* 8.9*  HCT 25.1*  24.9* 27.4* 26.2* 29.5* 28.1*  MCV 101.2* 101.2* 104.2* 102.7*  --  96.9  PLT 328 331 346 323  --  277   Basic Metabolic Panel: Recent Labs  Lab 07/24/18 0537 07/25/18 0759 07/26/18 0524 07/27/18 0711 07/28/18 0420 07/29/18 0503  NA 143 141 143 145 147* 142  K 4.0 3.9 4.1 3.7 3.7 3.5  CL 107 106 110 112* 112* 106  CO2 23 24 26 23 28 24   GLUCOSE 128* 118* 93 122* 122* 105*  BUN 86* 62* 51* 36* 29* 24*  CREATININE 2.25* 1.82* 1.70* 1.49* 1.50* 1.39*  CALCIUM 9.2 9.0 9.0 9.0 9.1 8.8*  MG 2.3  --   --   --  2.0  --   PHOS 4.6  --   --   --   --   --    GFR: Estimated Creatinine Clearance: 61.6 mL/min (A) (by C-G formula based on SCr of 1.39 mg/dL (H)). Liver Function Tests: No results for input(s): AST, ALT, ALKPHOS, BILITOT, PROT, ALBUMIN in the last 168 hours. No results for input(s): LIPASE, AMYLASE in the last 168 hours. No results for input(s): AMMONIA in the last 168 hours. Coagulation Profile: No results for input(s): INR, PROTIME in the last 168 hours. Cardiac Enzymes: No results for input(s): CKTOTAL, CKMB, CKMBINDEX, TROPONINI in the last 168 hours. BNP (last 3 results) Recent Labs  01/31/18 1101 03/04/18 1025 05/16/18 1100  PROBNP 6,793* 7,996* 3,272*   HbA1C: No results for input(s): HGBA1C in the last 72 hours. CBG: Recent Labs  Lab 07/28/18 1613 07/28/18 2022 07/29/18 0015 07/29/18 0416 07/29/18 0748  GLUCAP 129* 100* 100* 106* 105*   Lipid Profile: No results for input(s): CHOL, HDL, LDLCALC, TRIG, CHOLHDL, LDLDIRECT in the last 72 hours. Thyroid Function Tests: No results for input(s): TSH, T4TOTAL, FREET4, T3FREE, THYROIDAB in the last 72 hours. Anemia Panel: No results for input(s): VITAMINB12, FOLATE, FERRITIN, TIBC, IRON, RETICCTPCT in the last 72 hours. Sepsis Labs: No results for input(s): PROCALCITON, LATICACIDVEN in the last 168 hours.  Recent Results (from the past 240 hour(s))  Culture, blood (routine x 2)     Status: None    Collection Time: 07/21/18  4:35 AM  Result Value Ref Range Status   Specimen Description BLOOD LEFT ARM  Final   Special Requests   Final    BOTTLES DRAWN AEROBIC ONLY Blood Culture adequate volume   Culture   Final    NO GROWTH 5 DAYS Performed at [facility], [address], [place], [place] [address]    Report Status 07/26/2018 FINAL  Final  Culture, blood (routine x 2)     Status: None   Collection Time: 07/21/18  4:40 AM  Result Value Ref Range Status   Specimen Description BLOOD LEFT ARM  Final   Special Requests   Final    BOTTLES DRAWN AEROBIC ONLY Blood Culture adequate volume   Culture   Final    NO GROWTH 5 DAYS Performed at [facility], [place] [address] [place], [place] [address]    Report Status 07/26/2018 FINAL  Final  MRSA PCR Screening     Status: None   Collection Time: 07/21/18  4:49 AM  Result Value Ref Range Status   MRSA by PCR NEGATIVE NEGATIVE Final    Comment:        The GeneXpert MRSA Assay (FDA approved for NASAL specimens only), is one component of a comprehensive MRSA colonization surveillance program. It is not intended to diagnose MRSA infection nor to guide or monitor treatment for MRSA infections. Performed at [facility], [place] [address] [place], [place] [address]   Surgical pcr screen     Status: None   Collection Time: 07/21/18 10:37 AM  Result Value Ref Range Status   MRSA, PCR NEGATIVE NEGATIVE Final   Staphylococcus aureus NEGATIVE NEGATIVE Final    Comment: (NOTE) The Xpert SA Assay (FDA approved for NASAL specimens in patients 34 years of age and older), is one component of a comprehensive surveillance program. It is not intended to diagnose infection nor to guide or monitor treatment. Performed at [facility], [place] [address] [place],  [address]          Radiology Studies: Dg Abd 1 View  Result Date: 07/27/2018 CLINICAL DATA:  Ileus EXAM: ABDOMEN - 1 VIEW COMPARISON:  07/25/2018  FINDINGS: Scattered large and small bowel gas is noted. Persistent small bowel dilatation is identified. Some colonic gas is seen in these changes may represent a partial small bowel obstruction. Degenerative changes of the lumbar spine are noted. No new focal abnormality is seen. IMPRESSION: Diffuse small-bowel dilatation likely related to a partial small bowel obstruction. Clinical correlation is recommended. Electronically Signed   By: Inez Catalina M.D.   On: 07/27/2018 15:26   Dg Abd Portable 1v  Result Date: 07/28/2018  CLINICAL DATA:  Nasogastric tube placement. Recent partial bowel resection EXAM: PORTABLE ABDOMEN - 1 VIEW COMPARISON:  July 27, 2018 FINDINGS: Nasogastric tube tip and side port are in the stomach. There remain multiple loops of dilated small bowel without appreciable air-fluid levels. Air is seen and portions of the colon. No appreciable free air. IMPRESSION: Nasogastric tube tip and side port in stomach. Diffuse bowel dilatation remains. A degree of bowel obstruction cannot be excluded, although postoperative ileus may present in this manner. No free air demonstrable on this supine examination. Electronically Signed   By: Lowella Grip III M.D.   On: 07/28/2018 07:38        Scheduled Meds: . acetaminophen  1,000 mg Oral TID  . bisacodyl  10 mg Rectal Daily  . chlorhexidine  15 mL Mouth Rinse BID  . Chlorhexidine Gluconate Cloth  6 each Topical Daily  . insulin aspart  0-20 Units Subcutaneous Q4H  . insulin detemir  6 Units Subcutaneous Q12H  . lidocaine  1 patch Transdermal Q24H  . lip balm  1 application Topical BID  . mouth rinse  15 mL Mouth Rinse q12n4p  . methocarbamol  1,000 mg Oral TID  . pantoprazole  40 mg Intravenous Q12H   Continuous Infusions: . sodium chloride    . chlorproMAZINE (THORAZINE) IV 25 mg (07/27/18 1813)  . heparin 1,650 Units/hr (07/29/18 1037)     LOS: 8 days     Georgette Shell, MD [facility]  If 7PM-7AM, please  contact night-coverage [email or web address] Password [email or web address] 07/29/2018, 11:51 AM

## 2018-07-29 NOTE — Progress Notes (Signed)
Patient refused CPAP for tonight 

## 2018-07-29 NOTE — Progress Notes (Signed)
[facility] Progress Note  8 Days Post-Op  Subjective: CC: no complaints Tolerating sips of clears. Wants to eat. Some pain with dressing changes.   Objective: Vital signs in last 24 hours: Temp:  [97.8 F (36.6 C)-98.4 F (36.9 C)] 98.1 F (36.7 C) (08/06 0419) Pulse Rate:  [69-108] 69 (08/06 0419) Resp:  [16-22] 18 (08/06 0419) BP: (130-141)/(49-61) 135/61 (08/06 0419) SpO2:  [92 %-98 %] 96 % (08/06 0419) Weight:  [110 kg (242 lb 8.1 oz)] 110 kg (242 lb 8.1 oz) (08/06 0500) Last BM Date: 07/29/18  Intake/Output from previous day: 08/05 0701 - 08/06 0700 In: 2946.1 [P.O.:60; I.V.:2031.3; Blood:854.8] Out: 950 [Urine:700; Emesis/NG output:150; Drains:100] Intake/Output this shift: No intake/output data recorded.  PE: Gen:  Alert, NAD, pleasant Card:  Regular rate and rhythm, pedal pulses 2+ BL Pulm:  Normal effort, clear to auscultation bilaterally Abd: Soft, non-tender, non-distended, bowel sounds present, incision as below - 17.5 cm x 5.5 cm x 3 cm; small blister at umbilicus   Skin: warm and dry, no rashes  Psych: A&Ox3   Lab Results:  Recent Labs    07/28/18 0420 07/28/18 1836 07/29/18 0503  WBC 14.4*  --  15.2*  HGB 7.7* 9.1* 8.9*  HCT 26.2* 29.5* 28.1*  PLT 323  --  295   BMET Recent Labs    07/28/18 0420 07/29/18 0503  NA 147* 142  K 3.7 3.5  CL 112* 106  CO2 28 24  GLUCOSE 122* 105*  BUN 29* 24*  CREATININE 1.50* 1.39*  CALCIUM 9.1 8.8*   PT/INR No results for input(s): LABPROT, INR in the last 72 hours. CMP     Component Value Date/Time   NA 142 07/29/2018 0503   NA 137 05/16/2018 1100   NA 139 03/29/2015 0926   K 3.5 07/29/2018 0503   K 4.4 03/29/2015 0926   CL 106 07/29/2018 0503   CL 108 (H) 06/09/2013 1218   CO2 24 07/29/2018 0503   CO2 24 03/29/2015 0926   GLUCOSE 105 (H) 07/29/2018 0503   GLUCOSE 239 (H) 03/29/2015 0926   GLUCOSE 235 (H) 06/09/2013 1218   BUN 24 (H) 07/29/2018 0503   BUN 59 (H) 05/16/2018  1100   BUN 25.8 03/29/2015 0926   CREATININE 1.39 (H) 07/29/2018 0503   CREATININE 1.2 03/29/2015 0926   CALCIUM 8.8 (L) 07/29/2018 0503   CALCIUM 8.9 03/29/2015 0926   PROT 6.7 07/21/2018 0444   PROT 6.6 12/18/2017 1443   PROT 6.5 03/29/2015 0926   ALBUMIN 3.3 (L) 07/21/2018 0444   ALBUMIN 4.1 12/18/2017 1443   ALBUMIN 3.5 03/29/2015 0926   AST 49 (H) 07/21/2018 0444   AST 20 03/29/2015 0926   ALT 33 07/21/2018 0444   ALT 17 03/29/2015 0926   ALKPHOS 239 (H) 07/21/2018 0444   ALKPHOS 136 03/29/2015 0926   BILITOT 1.0 07/21/2018 0444   BILITOT 0.8 12/18/2017 1443   BILITOT 0.63 03/29/2015 0926   GFRNONAA 49 (L) 07/29/2018 0503   GFRAA 57 (L) 07/29/2018 0503   Lipase     Component Value Date/Time   LIPASE 60 (H) 07/21/2018 0444       Studies/Results: Dg Abd 1 View  Result Date: 07/27/2018 CLINICAL DATA:  Ileus EXAM: ABDOMEN - 1 VIEW COMPARISON:  07/25/2018 FINDINGS: Scattered large and small bowel gas is noted. Persistent small bowel dilatation is identified. Some colonic gas is seen in these changes may represent a partial small bowel obstruction. Degenerative changes of the lumbar spine  are noted. No new focal abnormality is seen. IMPRESSION: Diffuse small-bowel dilatation likely related to a partial small bowel obstruction. Clinical correlation is recommended. Electronically Signed   By: Inez Catalina M.D.   On: 07/27/2018 15:26   Dg Abd Portable 1v  Result Date: 07/28/2018 CLINICAL DATA:  Nasogastric tube placement. Recent partial bowel resection EXAM: PORTABLE ABDOMEN - 1 VIEW COMPARISON:  July 27, 2018 FINDINGS: Nasogastric tube tip and side port are in the stomach. There remain multiple loops of dilated small bowel without appreciable air-fluid levels. Air is seen and portions of the colon. No appreciable free air. IMPRESSION: Nasogastric tube tip and side port in stomach. Diffuse bowel dilatation remains. A degree of bowel obstruction cannot be excluded, although  postoperative ileus may present in this manner. No free air demonstrable on this supine examination. Electronically Signed   By: Lowella Grip III M.D.   On: 07/28/2018 07:38    Anti-infectives: Anti-infectives (From admission, onward)   Start     Dose/Rate Route Frequency Ordered Stop   07/21/18 1315  cefoTEtan (CEFOTAN) 2 g in sodium chloride 0.9 % 100 mL IVPB  Status:  Discontinued     2 g 200 mL/hr over 30 Minutes Intravenous Every 12 hours 07/21/18 1309 07/25/18 1220   07/21/18 1315  cefoTEtan (CEFOTAN) 2 g in sodium chloride 0.9 % 100 mL IVPB     2 g 200 mL/hr over 30 Minutes Intravenous To Surgery 07/21/18 1312 07/21/18 1210       Assessment/Plan HTN HLD T2DM -Defer to primary medicine service. COPD Hx of CAD S/p pacemaker placement A.Fib on eliquis - last dose yesterday, reversal agent given, s/p 1 unit PRBC 6/77 Chronic Diastolic CHF/Chronic ischemic heart disease OSA on CPAP Hx of colonic polyps and moderate sigmoid diverticulosis GERD Non-toxic multinodular goiter AKI on CKD stage III - Cr 1.39, heparin lock IV Obesity - BMI 31.6 ABL anemia - H/H 8.9/28.1, improved, s/p 1 unit PRBC 8/5  Small bowel ischemia with pneumatosis S/p exploratory laparotomy with small bowel resection and cecectomy 07/21/18 Dr. Brantley Stage Post-operative ileus -POD#8 - [id number], afebrile - check UA and CXR - patient having bowel function and tolerating sips of clears - FLD  - VACchange today  -OOB, mobilize with therapies. Try and get him up more.  FEN:FLD ID: cefotetan 7/29 >8/2 VTE: SCD's, heparin gtt - can restart eliquis when tolerating solids Foley: none Follow up: Dr. Brantley Stage    LOS: 8 days    Brigid Re , [facility] 07/29/2018, 11:11 AM Pager: [phone] Consults: [phone] Mon-Fri 7:00 am-4:30 pm Sat-Sun 7:00 am-11:30 am

## 2018-07-29 NOTE — Progress Notes (Addendum)
Physical Therapy Treatment Patient Details Name: Lawrence [name] MRN: [id number] DOB: [birth year] Today's Date: 07/29/2018    History of Present Illness Jasai Sorg is a 72 y.o. M s/p Exploratory laparotomy and small bowel resection and cecectomy. PMH includes T2DM, COPD, HLD, HTN, CAD s/p CABG, Pacemaker, A-FIb with Eliquis, CHF, OSA on CPAP, GERD, and Diverticulitis. Pt has a wound vac for his abdomen.      PT Comments    2 attempts to see pt today; pt willing to amb but had episode of bowel incontinence so amb to bathroom, wife assisting with hygiene; encouraged pt to amb with staff as he is able; He is in better spirits today and requiring less assist overall; should continue to progress well with mobility; recommend HHPT  (vs no f/u--depending on LOS and progress); PT will follow  Follow Up Recommendations  Home health PT     Equipment Recommendations  None recommended by PT    Recommendations for Other Services       Precautions / Restrictions Precautions Precautions: Other (comment) Precaution Comments: Pt has a L LE hairline fracture, and per wife, previous notes wife stated pt not allowed to  amb without brace; today pt and wife state he is allowed shorter distance amb without brace, longer or uneven distances he needs brace --they report this be Dr. Sid Falcon recomendation (brace is here today, unable to put it on d/t bowel incontinence Required Braces or Orthoses: Other Brace/Splint Other Brace/Splint: VAC/ abd site  Restrictions Weight Bearing Restrictions: No    Mobility  Bed Mobility               General bed mobility comments: sitting EOB  Transfers Overall transfer level: Needs assistance Equipment used: Rolling walker (2 wheeled) Transfers: Sit to/from Stand Sit to Stand: Supervision         General transfer comment: for safety and line management, no physical assist needed  Ambulation/Gait Ambulation/Gait assistance: Supervision;Min guard Gait  Distance (Feet): 15 Feet Assistive device: Rolling walker (2 wheeled) Gait Pattern/deviations: Step-through pattern;Decreased stride length     General Gait Details: began to amb toward door, pt with incontinence of bowels, amb to bathroom    Stairs             Wheelchair Mobility    Modified Rankin (Stroke Patients Only)       Balance                                            Cognition Arousal/Alertness: Awake/alert Behavior During Therapy: WFL for tasks assessed/performed Overall Cognitive Status: Within Functional Limits for tasks assessed                                        Exercises      General Comments        Pertinent Vitals/Pain Pain Assessment: Faces Faces Pain Scale: Hurts a little bit Pain Location: abdomen Pain Descriptors / Indicators: Grimacing Pain Intervention(s): Monitored during session    Home Living                      Prior Function            PT Goals (current goals can now be found in the care plan section) Acute  Rehab PT Goals Patient Stated Goal: To "Get out of here" PT Goal Formulation: With patient Time For Goal Achievement: 08/07/18 Potential to Achieve Goals: Good Progress towards PT goals: Progressing toward goals    Frequency    Min 3X/week      PT Plan Current plan remains appropriate    Co-evaluation              AM-PAC PT "6 Clicks" Daily Activity  Outcome Measure  Difficulty turning over in bed (including adjusting bedclothes, sheets and blankets)?: A Little Difficulty moving from lying on back to sitting on the side of the bed? : A Little Difficulty sitting down on and standing up from a chair with arms (e.g., wheelchair, bedside commode, etc,.)?: A Little Help needed moving to and from a bed to chair (including a wheelchair)?: A Little Help needed walking in hospital room?: A Little Help needed climbing 3-5 steps with a railing? : A Lot 6 Click  Score: 17    End of Session   Activity Tolerance: Patient tolerated treatment well;Other (comment)(ltd d/t incontinence) Patient left: Other (comment)(bathroom with wife)   PT Visit Diagnosis: Other abnormalities of gait and mobility (R26.89);Muscle weakness (generalized) (M62.81);Difficulty in walking, not elsewhere classified (R26.2)     Time: 2671-2458 PT Time Calculation (min) (ACUTE ONLY): 23 min  Charges:  $Gait Training 22-37 min                     Kenyon Ana, [place] Pager: [phone] 07/29/2018    [facility] 07/29/2018, 2:24 PM

## 2018-07-29 NOTE — Care Management Important Message (Signed)
Important Message  Patient Details  Name: Lawrence [name] MRN: [id number] Date of Birth: 25-Jul-1946   Medicare Important Message Given:  Yes    Priyal Musquiz 07/29/2018, 9:08 AM

## 2018-07-29 NOTE — Progress Notes (Signed)
ANTICOAGULATION CONSULT NOTE - Follow Up Consult  Pharmacy Consult for Heparin Indication: atrial fibrillation  Allergies  Allergen Reactions  . Bactrim [Sulfamethoxazole-Trimethoprim] Other (See Comments)    Heart to sleep, renal failure, potassium level spiked    Patient Measurements: Height: 6' (182.9 cm) Weight: 242 lb 8.1 oz (110 kg) IBW/kg (Calculated) : 77.6 Heparin Dosing Weight: 99.2 kg  Vital Signs: Temp: 98.1 F (36.7 C) (08/06 0419) Temp Source: Oral (08/06 0419) BP: 135/61 (08/06 0419) Pulse Rate: 69 (08/06 0419)  Labs: Recent Labs    07/27/18 0711 07/27/18 1906 07/28/18 0420 07/28/18 1836 07/29/18 0503  HGB 8.0*  --  7.7* 9.1* 8.9*  HCT 27.4*  --  26.2* 29.5* 28.1*  PLT 346  --  323  --  295  HEPARINUNFRC 0.17* 0.25* 0.46  --  0.36  CREATININE 1.49*  --  1.50*  --  1.39*    Estimated Creatinine Clearance: 61.6 mL/min (A) (by C-G formula based on SCr of 1.39 mg/dL (H)).    Assessment: 64 YOM with history of AVMs on Eliquis PTA for Afib.  Patient was admitted 7/29 from [facility] ED due to GIB s/p reversal at [place] and transferred to [facility]. He has small bowel ischemia with pneumatosis s/p ex lap with small bowel resection and cecectomy on 07/21/18. Surgery ok'd re-initiating IV Heparin on 8/3.   Heparin level therapeutic; no bleeding reported.  Diet being advanced.   Goal of Therapy:  Heparin level 0.3-0.7 units/ml Monitor platelets by anticoagulation protocol: Yes   Plan:  Continue heparin at 1650 units/hr Daily heparin level and CBC while on therapy.    Meshawn Oconnor D. Mina Marble, PharmD, BCPS, [place] 07/29/2018, 1:32 PM

## 2018-07-30 LAB — GLUCOSE, CAPILLARY
GLUCOSE-CAPILLARY: 139 mg/dL — AB (ref 70–99)
GLUCOSE-CAPILLARY: 148 mg/dL — AB (ref 70–99)
GLUCOSE-CAPILLARY: 171 mg/dL — AB (ref 70–99)
GLUCOSE-CAPILLARY: 81 mg/dL (ref 70–99)
Glucose-Capillary: 114 mg/dL — ABNORMAL HIGH (ref 70–99)
Glucose-Capillary: 86 mg/dL (ref 70–99)
Glucose-Capillary: 125 mg/dL — ABNORMAL HIGH (ref 70–99)

## 2018-07-30 LAB — CBC
HEMATOCRIT: 29.1 % — AB (ref 39.0–52.0)
HEMOGLOBIN: 8.9 g/dL — AB (ref 13.0–17.0)
MCH: 29.7 pg (ref 26.0–34.0)
MCHC: 30.6 g/dL (ref 30.0–36.0)
MCV: 97 fL (ref 78.0–100.0)
Platelets: 302 10*3/uL (ref 150–400)
RBC: 3 MIL/uL — AB (ref 4.22–5.81)
RDW: 16.6 % — ABNORMAL HIGH (ref 11.5–15.5)
WBC: 15.9 10*3/uL — ABNORMAL HIGH (ref 4.0–10.5)

## 2018-07-30 LAB — BASIC METABOLIC PANEL
Anion gap: 10 (ref 5–15)
BUN: 22 mg/dL (ref 8–23)
CHLORIDE: 109 mmol/L (ref 98–111)
CO2: 22 mmol/L (ref 22–32)
Calcium: 8.4 mg/dL — ABNORMAL LOW (ref 8.9–10.3)
Creatinine, Ser: 1.26 mg/dL — ABNORMAL HIGH (ref 0.61–1.24)
GFR calc Af Amer: >60 mL/min (ref >60)
GFR calc non Af Amer: 55 mL/min — ABNORMAL LOW (ref >60)
GLUCOSE: 86 mg/dL (ref 70–99)
Potassium: 3.5 mmol/L (ref 3.5–5.1)
Sodium: 141 mmol/L (ref 135–145)

## 2018-07-30 LAB — URINALYSIS, ROUTINE W REFLEX MICROSCOPIC
Bilirubin Urine: NEGATIVE
GLUCOSE, UA: NEGATIVE mg/dL
HGB URINE DIPSTICK: NEGATIVE
Ketones, ur: NEGATIVE mg/dL
Leukocytes, UA: NEGATIVE
Nitrite: NEGATIVE
PH: 5 (ref 5.0–8.0)
Protein, ur: NEGATIVE mg/dL
SPECIFIC GRAVITY, URINE: 1.018 (ref 1.005–1.030)

## 2018-07-30 LAB — C DIFFICILE QUICK SCREEN W PCR REFLEX
C DIFFICILE (CDIFF) INTERP: NOT DETECTED
C DIFFICILE (CDIFF) TOXIN: NEGATIVE
C Diff antigen: NEGATIVE

## 2018-07-30 MED ORDER — HYDROCORTISONE 2.5 % RE CREA: 1.0000 "application " | TOPICAL_CREAM | Freq: Four times a day (QID) | RECTAL | 0 refills | Status: DC | PRN

## 2018-07-30 MED ORDER — APIXABAN 5 MG PO TABS
5.0000 mg | ORAL_TABLET | Freq: Two times a day (BID) | ORAL | Status: DC
  Administered 2018-07-30 – 2018-07-31 (×3): 5 mg via ORAL
  Filled 2018-07-30 (×3): qty 1

## 2018-07-30 MED ORDER — POTASSIUM CHLORIDE 10 MEQ/100ML IV SOLN
10.0000 meq | INTRAVENOUS | Status: AC
  Administered 2018-07-30 (×2): 10 meq via INTRAVENOUS
  Filled 2018-07-30 (×2): qty 100

## 2018-07-30 MED ORDER — TRAMADOL HCL 50 MG PO TABS: 50.0000 mg | ORAL_TABLET | Freq: Four times a day (QID) | ORAL | 0 refills | Status: DC | PRN

## 2018-07-30 MED ORDER — GLUCERNA SHAKE PO LIQD
237.0000 mL | Freq: Three times a day (TID) | ORAL | Status: DC
  Administered 2018-07-30 (×3): 237 mL via ORAL

## 2018-07-30 MED ORDER — ADULT MULTIVITAMIN W/MINERALS CH
1.0000 | ORAL_TABLET | Freq: Every day | ORAL | Status: DC
  Administered 2018-07-30 – 2018-07-31 (×2): 1 via ORAL
  Filled 2018-07-30 (×2): qty 1

## 2018-07-30 NOTE — Progress Notes (Addendum)
PROGRESS NOTE    Lawrence [name]  QPY:[id number] DOB: 03-28-1946 DOA: 07/21/2018 PCP: [name], MD   Brief Narrative 72 yo male with small bowel ischemia ,pneumatosis s/p exploratory lap with small bowel resection and cecectomy 7/29.TRH assumed care 8/1.surgery following. Patient was admitted by critical care service on 07/21/2018, per admission note by Ms. Eubanks 72 year old male with PMH of COPD, DM, HLD, HTN, CAD, S/P Pacemaker, A.Fib on Eliquis, Diastolic HF, OSA on CPAP at HS, H/O AVM Colonic Polys and Moderate Sigmoid Diverticulosis, GERD.  Presents to [facility] ED on 7/29 with Abdominal Pain, Nausea, and reportedly two days of Hematocheziain which appeared black/tarry. Hemoglobin 8.8. Given 1 unit RBC at OSH. Eliquis reversed with Anti-Inhibitor Coagulant Complex. Patient transferred to [facility] for Further Evaluation.  On arrival to ICU patient is alert and oriented with systolic 124. On insulin gtt. Complaining of abdominal tenderness and nausea.  Patient was admitted to ICU, found to have small bowel ischemia/pneumatosis, hematochezia, underwent exploratory laparotomy with small bowel resection and seek cecectomy on 07/21/2018 (Dr Brantley Stage). He was extubated on 7/30. Patient was transferred to hospitalist service on 8/1 and TRH assumed care on 8/1.   Assessment & Plan:   Principal Problem:   Small bowel ischemia w pneumatosis s/p ileocectomy 07/21/2018 Active Problems:   COPD (chronic obstructive pulmonary disease) (HCC)   Morbid obesity (HCC)   CHF (congestive heart failure), NYHA class II, chronic, diastolic (HCC)   GI bleed   CKD (chronic kidney disease) stage 3, GFR 30-59 ml/min (HCC)   1]s/p small bowel resection and cecectomy-he had multiple loose bowel movements overnight.no fever.has had leukocytosis for few days which is slightly worse today.patient reports feeling better.advance diet per surgery.cdiff negative.possible ct scan abd/pelvis if wbc still  high.  2]DM on levemir at home restart  3]diasolic chf-takes lasix and metalazone at home.currently not on it as he has been npo mostly and excessive gi loss.  Received a dose of Lasix 07/28/2018  4]PAfib-eliquis restarted.  5]Acute blood loss anemia, iron deficiency -Likely due to #1, anticoagulation currently on hold -Baseline hemoglobin 10-11, hemoglobin7.7on 8/5//2019 He received a unit of blood transfusion 07/29/2018 has gone up to 8.9 from 7.7.    DVT prophylaxis: eliquis Code Status:full Family Communication: none Disposition Plan:  DC when surgery says ok/he should be able to be discharged in 1 to 2 days if he remains stable.  Consultants: surgery  Procedures: exploratory lap and small bowel resection and cecectomy Antimicrobials: none  Subjective:had multiple bowel movements loose took dulcolax suppository last nite   Objective: Vitals:   07/29/18 2148 07/30/18 0500 07/30/18 0551 07/30/18 1453  BP: (!) 122/52  132/60 (!) 109/53  Pulse: 73  74 78  Resp:   18 20  Temp: 98.1 F (36.7 C)  98.8 F (37.1 C) 98.3 F (36.8 C)  TempSrc: Oral  Oral Oral  SpO2: 95%  96% 97%  Weight:  108.4 kg (238 lb 15.7 oz)    Height:        Intake/Output Summary (Last 24 hours) at 07/30/2018 1823 Last data filed at 07/30/2018 1500 Gross per 24 hour  Intake 1450.85 ml  Output 375 ml  Net 1075.85 ml   Filed Weights   07/28/18 0600 07/29/18 0500 07/30/18 0500  Weight: 105.7 kg (233 lb 0.4 oz) 110 kg (242 lb 8.1 oz) 108.4 kg (238 lb 15.7 oz)    Examination:  General exam: Appears calm and comfortable  Respiratory system: Clear to auscultation. Respiratory  effort normal. Cardiovascular system: S1 & S2 heard, RRR. No JVD, murmurs, rubs, gallops or clicks. No pedal edema. Gastrointestinal system: Abdomen is nondistended, soft and nontender. No organomegaly or masses felt. Normal bowel sounds heard.incision covered with dressings Central nervous system: Alert and oriented. No focal  neurological deficits. Extremities: Symmetric 5 x 5 power. Skin: No rashes, lesions or ulcers Psychiatry: Judgement and insight appear normal. Mood & affect appropriate.     Data Reviewed: I have personally reviewed following labs and imaging studies  CBC: Recent Labs  Lab 07/26/18 0524 07/27/18 0711 07/28/18 0420 07/28/18 1836 07/29/18 0503 07/30/18 1221  WBC 12.6* 13.6* 14.4*  --  15.2* 15.9*  HGB 7.6* 8.0* 7.7* 9.1* 8.9* 8.9*  HCT 24.9* 27.4* 26.2* 29.5* 28.1* 29.1*  MCV 101.2* 104.2* 102.7*  --  96.9 97.0  PLT 331 346 323  --  295 185   Basic Metabolic Panel: Recent Labs  Lab 07/24/18 0537  07/26/18 0524 07/27/18 0711 07/28/18 0420 07/29/18 0503 07/30/18 0558  NA 143   < > 143 145 147* 142 141  K 4.0   < > 4.1 3.7 3.7 3.5 3.5  CL 107   < > 110 112* 112* 106 109  CO2 23   < > 26 23 28 24 22   GLUCOSE 128*   < > 93 122* 122* 105* 86  BUN 86*   < > 51* 36* 29* 24* 22  CREATININE 2.25*   < > 1.70* 1.49* 1.50* 1.39* 1.26*  CALCIUM 9.2   < > 9.0 9.0 9.1 8.8* 8.4*  MG 2.3  --   --   --  2.0  --   --   PHOS 4.6  --   --   --   --   --   --    < > = values in this interval not displayed.   GFR: Estimated Creatinine Clearance: 67.4 mL/min (A) (by C-G formula based on SCr of 1.26 mg/dL (H)). Liver Function Tests: No results for input(s): AST, ALT, ALKPHOS, BILITOT, PROT, ALBUMIN in the last 168 hours. No results for input(s): LIPASE, AMYLASE in the last 168 hours. No results for input(s): AMMONIA in the last 168 hours. Coagulation Profile: No results for input(s): INR, PROTIME in the last 168 hours. Cardiac Enzymes: No results for input(s): CKTOTAL, CKMB, CKMBINDEX, TROPONINI in the last 168 hours. BNP (last 3 results) Recent Labs    01/31/18 1101 03/04/18 1025 05/16/18 1100  PROBNP 6,793* 7,996* 3,272*   HbA1C: No results for input(s): HGBA1C in the last 72 hours. CBG: Recent Labs  Lab 07/30/18 0048 07/30/18 0408 07/30/18 0731 07/30/18 1210  07/30/18 1557  GLUCAP 114* 86 81 125* 139*   Lipid Profile: No results for input(s): CHOL, HDL, LDLCALC, TRIG, CHOLHDL, LDLDIRECT in the last 72 hours. Thyroid Function Tests: No results for input(s): TSH, T4TOTAL, FREET4, T3FREE, THYROIDAB in the last 72 hours. Anemia Panel: No results for input(s): VITAMINB12, FOLATE, FERRITIN, TIBC, IRON, RETICCTPCT in the last 72 hours. Sepsis Labs: No results for input(s): PROCALCITON, LATICACIDVEN in the last 168 hours.  Recent Results (from the past 240 hour(s))  Culture, blood (routine x 2)     Status: None   Collection Time: 07/21/18  4:35 AM  Result Value Ref Range Status   Specimen Description BLOOD LEFT ARM  Final   Special Requests   Final    BOTTLES DRAWN AEROBIC ONLY Blood Culture adequate volume   Culture   Final    NO GROWTH  5 DAYS Performed at [facility], [place] [address], [place], [place] [address]    Report Status 07/26/2018 FINAL  Final  Culture, blood (routine x 2)     Status: None   Collection Time: 07/21/18  4:40 AM  Result Value Ref Range Status   Specimen Description BLOOD LEFT ARM  Final   Special Requests   Final    BOTTLES DRAWN AEROBIC ONLY Blood Culture adequate volume   Culture   Final    NO GROWTH 5 DAYS Performed at [facility], [place] [address], [place], [place] [address]    Report Status 07/26/2018 FINAL  Final  MRSA PCR Screening     Status: None   Collection Time: 07/21/18  4:49 AM  Result Value Ref Range Status   MRSA by PCR NEGATIVE NEGATIVE Final    Comment:        The GeneXpert MRSA Assay (FDA approved for NASAL specimens only), is one component of a comprehensive MRSA colonization surveillance program. It is not intended to diagnose MRSA infection nor to guide or monitor treatment for MRSA infections. Performed at [facility], [place] [address] [place], [place] [address]   Surgical pcr screen     Status: None   Collection Time: 07/21/18 10:37 AM  Result Value Ref Range  Status   MRSA, PCR NEGATIVE NEGATIVE Final   Staphylococcus aureus NEGATIVE NEGATIVE Final    Comment: (NOTE) The Xpert SA Assay (FDA approved for NASAL specimens in patients 57 years of age and older), is one component of a comprehensive surveillance program. It is not intended to diagnose infection nor to guide or monitor treatment. Performed at [facility], [place] [address] [place], [place] [address]   C difficile quick scan w PCR reflex     Status: None   Collection Time: 07/30/18  2:05 PM  Result Value Ref Range Status   C Diff antigen NEGATIVE NEGATIVE Final   C Diff toxin NEGATIVE NEGATIVE Final   C Diff interpretation No C. difficile detected.  Final    Comment: Performed at [facility], [place] [address] [place], [place] [address]         Radiology Studies: Dg Chest Port 1 View  Result Date: 07/29/2018 CLINICAL DATA:  Leukocytosis. EXAM: PORTABLE CHEST 1 VIEW COMPARISON:  07/22/2018. FINDINGS: Cardiac pacer with lead tip over the right atrium right ventricle. Prior CABG. Cardiomegaly with normal pulmonary vascularity. Low lung volumes with mild bibasilar atelectasis. No pleural effusion or pneumothorax. Small metallic densities noted over the right shoulder, possibly postsurgical. IMPRESSION: 1. Prior CABG. Cardiac pacer noted with lead tips projected over the right atrium right ventricle. Mild cardiomegaly. No pulmonary venous congestion. 2.  No acute pulmonary abnormality. Electronically Signed   By: Marcello Moores  Register   On: 07/29/2018 12:32        Scheduled Meds: . acetaminophen  1,000 mg Oral TID  . apixaban  5 mg Oral BID  . bisacodyl  10 mg Rectal Daily  . chlorhexidine  15 mL Mouth Rinse BID  . Chlorhexidine Gluconate Cloth  6 each Topical Daily  . feeding supplement (GLUCERNA SHAKE)  237 mL Oral TID BM  . insulin aspart  0-20 Units Subcutaneous Q4H  . insulin detemir  6 Units Subcutaneous Q12H  . lidocaine  1 patch Transdermal Q24H  . lip balm  1  application Topical BID  . mouth rinse  15 mL Mouth Rinse q12n4p  . methocarbamol  1,000 mg Oral TID  . multivitamin with minerals  1 tablet Oral Daily  . pantoprazole  40 mg Intravenous Q12H   Continuous Infusions: . sodium chloride 250 mL (07/30/18 0527)  . chlorproMAZINE (THORAZINE) IV 25 mg (07/27/18 1813)     LOS: 9 days     Georgette Shell, MD [facility]  If 7PM-7AM, please contact night-coverage [email or web address] Password [facility] 07/30/2018, 6:23 PM

## 2018-07-30 NOTE — Discharge Instructions (Addendum)
Information on my medicine - ELIQUIS (apixaban)  This medication education was reviewed with me or my healthcare representative as part of my discharge preparation.  The pharmacist that spoke with me during my hospital stay was:  Saundra Shelling, [facility]  Why was Eliquis prescribed for you? Eliquis was prescribed for you to reduce the risk of a blood clot forming that can cause a stroke if you have a medical condition called atrial fibrillation (a type of irregular heartbeat).  What do You need to know about Eliquis ? Take your Eliquis TWICE DAILY - one tablet in the morning and one tablet in the evening with or without food. If you have difficulty swallowing the tablet whole please discuss with your pharmacist how to take the medication safely.   [facility], [place] [phone]  OPEN ABDOMINAL SURGERY: POST OP INSTRUCTIONS  Always review your discharge instruction sheet given to you by the facility where your surgery was performed.  IF YOU HAVE DISABILITY OR FAMILY LEAVE FORMS, YOU MUST BRING THEM TO THE OFFICE FOR PROCESSING.  PLEASE DO NOT GIVE THEM TO YOUR DOCTOR.  1. A prescription for pain medication may be given to you upon discharge.  Take your pain medication as prescribed, if needed.  If narcotic pain medicine is not needed, then you may take acetaminophen (Tylenol) or ibuprofen (Advil) as needed. 2. Take your usually prescribed medications unless otherwise directed. 3. If you need a refill on your pain medication, please contact your pharmacy. They will contact our office to request authorization.  Prescriptions will not be filled after 5pm or on week-ends. 4. You should follow a light diet the first few days after arrival home, such as soup and crackers, pudding, etc.unless your doctor has advised otherwise. A high-fiber, low fat diet can be resumed as tolerated.   Be sure to include lots of fluids daily. Most patients will experience some swelling and bruising  on the chest and neck area.  Ice packs will help.  Swelling and bruising can take several days to resolve 5. Most patients will experience some swelling and bruising in the area of the incision. Ice pack will help. Swelling and bruising can take several days to resolve..  6. It is common to experience some constipation if taking pain medication after surgery.  Increasing fluid intake and taking a stool softener will usually help or prevent this problem from occurring.  A mild laxative (Milk of Magnesia or Miralax) should be taken according to package directions if there are no bowel movements after 48 hours. 7.  You may have steri-strips (small skin tapes) in place directly over the incision.  These strips should be left on the skin for 7-10 days.  If your surgeon used skin glue on the incision, you may shower in 24 hours.  The glue will flake off over the next 2-3 weeks.  Any sutures or staples will be removed at the office during your follow-up visit. You may find that a light gauze bandage over your incision may keep your staples from being rubbed or pulled. You may shower and replace the bandage daily. 8. ACTIVITIES:  You may resume regular (light) daily activities beginning the next day--such as daily self-care, walking, climbing stairs--gradually increasing activities as tolerated.  You may have sexual intercourse when it is comfortable.  Refrain from any heavy lifting or straining until approved by your doctor. a. You may drive when you no longer are taking prescription pain medication,  you can comfortably wear a seatbelt, and you can safely maneuver your car and apply brakes b. Return to Work: ___________________________________ 4. You should see your doctor in the office for a follow-up appointment approximately two weeks after your surgery.  Make sure that you call for this appointment within a day or two after you arrive home to insure a convenient appointment time. OTHER INSTRUCTIONS:   _____________________________________________________________ _____________________________________________________________  WHEN TO CALL YOUR DOCTOR: 1. Fever over 101.0 2. Inability to urinate 3. Nausea and/or vomiting 4. Extreme swelling or bruising 5. Continued bleeding from incision. 6. Increased pain, redness, or drainage from the incision. 7. Difficulty swallowing or breathing 8. Muscle cramping or spasms. 9. Numbness or tingling in hands or feet or around lips.  The clinic staff is available to answer your questions during regular business hours.  Please dont hesitate to call and ask to speak to one of the nurses if you have concerns.  For further questions, please visit [email or web address]    Take Eliquis exactly as prescribed by your doctor and DO NOT stop taking Eliquis without talking to the doctor who prescribed the medication.  Stopping may increase your risk of developing a stroke.  Refill your prescription before you run out.  After discharge, you should have regular check-up appointments with your healthcare provider that is prescribing your Eliquis.  In the future your dose may need to be changed if your kidney function or weight changes by a significant amount or as you get older.  What do you do if you miss a dose? If you miss a dose, take it as soon as you remember on the same day and resume taking twice daily.  Do not take more than one dose of ELIQUIS at the same time to make up a missed dose.  Important Safety Information A possible side effect of Eliquis is bleeding. You should call your healthcare provider right away if you experience any of the following: ? Bleeding from an injury or your nose that does not stop. ? Unusual colored urine (red or dark brown) or unusual colored stools (red or black). ? Unusual bruising for unknown reasons. ? A serious fall or if you hit your head (even if there is no bleeding).  Some medicines may interact  with Eliquis and might increase your risk of bleeding or clotting while on Eliquis. To help avoid this, consult your healthcare provider or pharmacist prior to using any new prescription or non-prescription medications, including herbals, vitamins, non-steroidal anti-inflammatory drugs (NSAIDs) and supplements.  This website has more information on Eliquis (apixaban): http://www.eliquis.com/eliquis/home

## 2018-07-30 NOTE — Progress Notes (Signed)
[facility] Progress Note  9 Days Post-Op  Subjective: CC: diarrhea Patient reports 19 BMs overnight. Stools are no longer black. Abdominal pain well controlled. Patient feels better. Tolerating FLD.  Objective: Vital signs in last 24 hours: Temp:  [98.1 F (36.7 C)-98.8 F (37.1 C)] 98.8 F (37.1 C) (08/07 0551) Pulse Rate:  [71-74] 74 (08/07 0551) Resp:  [18-20] 18 (08/07 0551) BP: (122-148)/(52-61) 132/60 (08/07 0551) SpO2:  [95 %-96 %] 96 % (08/07 0551) Weight:  [108.4 kg (238 lb 15.7 oz)] 108.4 kg (238 lb 15.7 oz) (08/07 0500) Last BM Date: 07/29/18  Intake/Output from previous day: 08/06 0701 - 08/07 0700 In: 1940.9 [P.O.:1550; I.V.:390.9] Out: 475 [Urine:325; Drains:150] Intake/Output this shift: Total I/O In: 240 [P.O.:240] Out: -   PE: Gen:  Alert, NAD, pleasant Card:  Regular rate and rhythm, pedal pulses 2+ BL, mild edema in LEs Pulm:  Normal effort, clear to auscultation bilaterally Abd: Soft, non-tender, non-distended, bowel sounds present, VAC to midline wound Skin: warm and dry, no rashes  Psych: A&Ox3   Lab Results:  Recent Labs    07/29/18 0503 07/30/18 1221  WBC 15.2* 15.9*  HGB 8.9* 8.9*  HCT 28.1* 29.1*  PLT 295 302   BMET Recent Labs    07/29/18 0503 07/30/18 0558  NA 142 141  K 3.5 3.5  CL 106 109  CO2 24 22  GLUCOSE 105* 86  BUN 24* 22  CREATININE 1.39* 1.26*  CALCIUM 8.8* 8.4*   PT/INR No results for input(s): LABPROT, INR in the last 72 hours. CMP     Component Value Date/Time   NA 141 07/30/2018 0558   NA 137 05/16/2018 1100   NA 139 03/29/2015 0926   K 3.5 07/30/2018 0558   K 4.4 03/29/2015 0926   CL 109 07/30/2018 0558   CL 108 (H) 06/09/2013 1218   CO2 22 07/30/2018 0558   CO2 24 03/29/2015 0926   GLUCOSE 86 07/30/2018 0558   GLUCOSE 239 (H) 03/29/2015 0926   GLUCOSE 235 (H) 06/09/2013 1218   BUN 22 07/30/2018 0558   BUN 59 (H) 05/16/2018 1100   BUN 25.8 03/29/2015 0926   CREATININE 1.26 (H)  07/30/2018 0558   CREATININE 1.2 03/29/2015 0926   CALCIUM 8.4 (L) 07/30/2018 0558   CALCIUM 8.9 03/29/2015 0926   PROT 6.7 07/21/2018 0444   PROT 6.6 12/18/2017 1443   PROT 6.5 03/29/2015 0926   ALBUMIN 3.3 (L) 07/21/2018 0444   ALBUMIN 4.1 12/18/2017 1443   ALBUMIN 3.5 03/29/2015 0926   AST 49 (H) 07/21/2018 0444   AST 20 03/29/2015 0926   ALT 33 07/21/2018 0444   ALT 17 03/29/2015 0926   ALKPHOS 239 (H) 07/21/2018 0444   ALKPHOS 136 03/29/2015 0926   BILITOT 1.0 07/21/2018 0444   BILITOT 0.8 12/18/2017 1443   BILITOT 0.63 03/29/2015 0926   GFRNONAA 55 (L) 07/30/2018 0558   GFRAA >60 07/30/2018 0558   Lipase     Component Value Date/Time   LIPASE 60 (H) 07/21/2018 0444       Studies/Results: Dg Chest Port 1 View  Result Date: 07/29/2018 CLINICAL DATA:  Leukocytosis. EXAM: PORTABLE CHEST 1 VIEW COMPARISON:  07/22/2018. FINDINGS: Cardiac pacer with lead tip over the right atrium right ventricle. Prior CABG. Cardiomegaly with normal pulmonary vascularity. Low lung volumes with mild bibasilar atelectasis. No pleural effusion or pneumothorax. Small metallic densities noted over the right shoulder, possibly postsurgical. IMPRESSION: 1. Prior CABG. Cardiac pacer noted with lead tips projected  over the right atrium right ventricle. Mild cardiomegaly. No pulmonary venous congestion. 2.  No acute pulmonary abnormality. Electronically Signed   By: Marcello Moores  Register   On: 07/29/2018 12:32    Anti-infectives: Anti-infectives (From admission, onward)   Start     Dose/Rate Route Frequency Ordered Stop   07/21/18 1315  cefoTEtan (CEFOTAN) 2 g in sodium chloride 0.9 % 100 mL IVPB  Status:  Discontinued     2 g 200 mL/hr over 30 Minutes Intravenous Every 12 hours 07/21/18 1309 07/25/18 1220   07/21/18 1315  cefoTEtan (CEFOTAN) 2 g in sodium chloride 0.9 % 100 mL IVPB     2 g 200 mL/hr over 30 Minutes Intravenous To Surgery 07/21/18 1312 07/21/18 1210        Assessment/Plan HTN HLD T2DM-Defer to primary medicine service. COPD Hx of CAD S/p pacemaker placement A.Fib on eliquis - last dose yesterday, reversal agent given, s/p 1 unit PRBC 1/44 Chronic Diastolic CHF/Chronic ischemic heart disease OSA on CPAP Hx of colonic polyps and moderate sigmoid diverticulosis GERD Non-toxic multinodular goiter AKI on CKD stage III - Cr1.26 Obesity- BMI 31.6 ABL anemia - H/H 8.9/29.1, improved, s/p 1 unit PRBC 8/5  Small bowel ischemia with pneumatosis S/p exploratory laparotomy with small bowel resection and cecectomy 07/21/18 Dr. Brantley Stage Post-operative ileus -POD#9 - QPE48.35,[id number]- UA and CXR negative, check C.Diff given diarrhea - tol FLD - advance to soft diet - continue VAC  OHC:SPZZ diet ID: [id number] 7/29 >8/2 VTE: SCD's,eliquis Foley: none Follow up: Dr. Brantley Stage  Check C.Diff, if negative may need CT abd/pelvis to r/o intraabdominal source of leukocytosis. Hopefully home in the next few days  LOS: 9 days    Brigid Re , [facility] 07/30/2018, 1:47 PM Pager: [phone] Consults: [phone] Mon-Fri 7:00 am-4:30 pm Sat-Sun 7:00 am-11:30 am

## 2018-07-30 NOTE — Progress Notes (Signed)
Nutrition Follow-up  DOCUMENTATION CODES:   Obesity unspecified  INTERVENTION:   -Glucerna Shake po TID, each supplement provides 220 kcal and 10 grams of protein -MVI with minerals daily  NUTRITION DIAGNOSIS:   Increased nutrient needs related to wound healing, post-op healing as evidenced by estimated needs.  Ongoing  GOAL:   Patient will meet greater than or equal to 90% of their needs  Progressing  MONITOR:   PO intake, Supplement acceptance, Diet advancement, Labs, Weight trends, Skin, I & O's  REASON FOR ASSESSMENT:   NPO/Clear Liquid Diet    ASSESSMENT:   72 year old male with PMH of AVM and Diverticulosis on Eliquis presents to ED with dark tarry stools with nausea and ABD pain.   7/29- NGT placed; ischemic small bowel per GI; s/p exploratory laparotomy small bowel resection andcecectomy 7/31- NGT removed, wound vac applied to midline abdominal surgical wound 8/5- NGT placed, NGT removed 8/6- advanced to full liquids  Pt receiving nursing care at time of visit.   Case discussed with RN, who reports pt is tolerating full liquid diet well. Noted meal completion 100%. Per RN, no plans to advance diet currently.   Labs reviewed: CBGS: 81-114 (inpatient orders for glycemic control are 0-20 units insulin aspart every 4 hours and 6 units insulin detemir every 12 hours).   Diet Order:   Diet Order           Diet full liquid Room service appropriate? Yes; Fluid consistency: Thin  Diet effective now          EDUCATION NEEDS:   Education needs have been addressed  Skin:  Skin Assessment: Skin Integrity Issues: Skin Integrity Issues:: Wound VAC Wound Vac: abdominal incision  Last BM:  07/29/18  Height:   Ht Readings from Last 1 Encounters:  07/21/18 6' (1.829 m)    Weight:   Wt Readings from Last 1 Encounters:  07/30/18 238 lb 15.7 oz (108.4 kg)    Ideal Body Weight:  80.9 kg  BMI:  Body mass index is 32.41 kg/m.  Estimated Nutritional  Needs:   Kcal:  2200-2400  Protein:  120-135 grams  Fluid:  >2.2 L    Aerik Polan A. Jimmye Norman, RD, LDN, CDE Pager: [phone] After hours Pager: [phone]

## 2018-07-30 NOTE — Progress Notes (Signed)
ANTICOAGULATION CONSULT NOTE - Follow Up Consult  Pharmacy Consult for Eliquis Indication: atrial fibrillation  Allergies  Allergen Reactions  . Bactrim [Sulfamethoxazole-Trimethoprim] Other (See Comments)    Heart to sleep, renal failure, potassium level spiked    Patient Measurements: Height: 6' (182.9 cm) Weight: 238 lb 15.7 oz (108.4 kg) IBW/kg (Calculated) : 77.6 Heparin Dosing Weight: 99.2 kg  Vital Signs: Temp: 98.8 F (37.1 C) (08/07 0551) Temp Source: Oral (08/07 0551) BP: 132/60 (08/07 0551) Pulse Rate: 74 (08/07 0551)  Labs: Recent Labs    07/27/18 0711 07/27/18 1906 07/28/18 0420 07/28/18 1836 07/29/18 0503  HGB 8.0*  --  7.7* 9.1* 8.9*  HCT 27.4*  --  26.2* 29.5* 28.1*  PLT 346  --  323  --  295  HEPARINUNFRC 0.17* 0.25* 0.46  --  0.36  CREATININE 1.49*  --  1.50*  --  1.39*    Estimated Creatinine Clearance: 61.1 mL/min (A) (by C-G formula based on SCr of 1.39 mg/dL (H)).  Assessment: 72 YO Male with h/o Afib s/p small bowel resection, to resume Eliquis   Goal of Therapy:  Heparin level 0.3-0.7 units/ml Monitor platelets by anticoagulation protocol: Yes   Plan:  Eliquis 5 mg BID  Phillis Knack, PharmD, BCPS  07/30/2018, 6:08 AM

## 2018-07-30 NOTE — Progress Notes (Signed)
RT NOTE:  Pt refuses CPAP tonight. 

## 2018-07-31 DIAGNOSIS — I5032 Chronic diastolic (congestive) heart failure: Secondary | ICD-10-CM

## 2018-07-31 DIAGNOSIS — I482 Chronic atrial fibrillation: Secondary | ICD-10-CM

## 2018-07-31 DIAGNOSIS — I4821 Permanent atrial fibrillation: Secondary | ICD-10-CM

## 2018-07-31 DIAGNOSIS — N183 Chronic kidney disease, stage 3 (moderate): Secondary | ICD-10-CM

## 2018-07-31 LAB — CBC
HCT: 28.9 % — ABNORMAL LOW (ref 39.0–52.0)
Hemoglobin: 8.9 g/dL — ABNORMAL LOW (ref 13.0–17.0)
MCH: 29.9 pg (ref 26.0–34.0)
MCHC: 30.8 g/dL (ref 30.0–36.0)
MCV: 97 fL (ref 78.0–100.0)
PLATELETS: 294 10*3/uL (ref 150–400)
RBC: 2.98 MIL/uL — AB (ref 4.22–5.81)
RDW: 16.1 % — ABNORMAL HIGH (ref 11.5–15.5)
WBC: 12.3 10*3/uL — AB (ref 4.0–10.5)

## 2018-07-31 LAB — GLUCOSE, CAPILLARY
GLUCOSE-CAPILLARY: 94 mg/dL (ref 70–99)
GLUCOSE-CAPILLARY: 129 mg/dL — AB (ref 70–99)
Glucose-Capillary: 127 mg/dL — ABNORMAL HIGH (ref 70–99)
Glucose-Capillary: 133 mg/dL — ABNORMAL HIGH (ref 70–99)

## 2018-07-31 LAB — BASIC METABOLIC PANEL
ANION GAP: 10 (ref 5–15)
BUN: 20 mg/dL (ref 8–23)
CO2: 22 mmol/L (ref 22–32)
Calcium: 8.4 mg/dL — ABNORMAL LOW (ref 8.9–10.3)
Chloride: 109 mmol/L (ref 98–111)
Creatinine, Ser: 1.29 mg/dL — ABNORMAL HIGH (ref 0.61–1.24)
GFR calc Af Amer: >60 mL/min (ref >60)
GFR, EST NON AFRICAN AMERICAN: 54 mL/min — AB (ref >60)
Glucose, Bld: 107 mg/dL — ABNORMAL HIGH (ref 70–99)
POTASSIUM: 3.5 mmol/L (ref 3.5–5.1)
SODIUM: 141 mmol/L (ref 135–145)

## 2018-07-31 LAB — URINE CULTURE: CULTURE: NO GROWTH

## 2018-07-31 LAB — MAGNESIUM: Magnesium: 1.8 mg/dL (ref 1.7–2.4)

## 2018-07-31 MED ORDER — PANTOPRAZOLE SODIUM 40 MG PO TBEC: 40.0000 mg | DELAYED_RELEASE_TABLET | Freq: Two times a day (BID) | ORAL | Status: DC

## 2018-07-31 MED ORDER — MAGNESIUM SULFATE 4 GM/100ML IV SOLN
4.0000 g | Freq: Once | INTRAVENOUS | Status: AC
  Administered 2018-07-31: 4 g via INTRAVENOUS
  Filled 2018-07-31: qty 100

## 2018-07-31 MED ORDER — METOLAZONE 2.5 MG PO TABS: 2.5000 mg | ORAL_TABLET | ORAL | Status: DC

## 2018-07-31 MED ORDER — POTASSIUM CHLORIDE CRYS ER 20 MEQ PO TBCR
40.0000 meq | EXTENDED_RELEASE_TABLET | Freq: Once | ORAL | Status: AC
  Administered 2018-07-31: 40 meq via ORAL
  Filled 2018-07-31: qty 2

## 2018-07-31 NOTE — Progress Notes (Signed)
Abdominal wound dressing changed and wound vac changed to portable one. Discharged home today, accompanied by patient's wife.Personal belongings,discharged instructions and prescription given to patient. Verbalized understanding of instructions.

## 2018-07-31 NOTE — Consult Note (Signed)
[facility] CM Primary Care Navigator  07/31/2018  Lawrence [name] [birth year] [id number]   Met withpatientand wife (Lawrence [name])at the bedsideto identify possible discharge needs. Patientreports having"abdominal pain, nausea and vomiting" thatresulted to this admission/ surgery.(small bowel ischemia/ pneumatosis, underwent exploratory laparotomy with small bowel resection and cecectomy)  Patientend[facility] Dr. [facility].   [facility] [place] and [facility] delivery service to obtain medications without difficulty.   Patient's wife reportsmanaginghismedications at [facility] use of "pill box" system filled every week.  Patient statesthat wife has beendriving and providingtransportation to hisdoctors'appointments.  Patientliveswith wife who serves as his primary caregiver at home.  Anticipateddischarge planishomewith home health services according to wife.  Patientand wifevoiced understanding to call primary care provider's officewhenhereturnshomefor a post discharge follow-up within1- 2weeksor sooner if needs arise.Patient letter (with PCP's contact number) was provided astheirreminder.   Discussed with patientand wife about[facility] CM services available for health management and resources at home butboth denied any needs or concerns at this time. Patient states that he "does not feel the need for services" at this point, since wife has been helping him manage his health issues at home. Both patient and wife are well informed and knowledgeable of ways to manage his health conditions (DM, HF, Afib, HTN, anemia). Wife reports that she has been in constant communication with providers using "my chart" when needed.  Encouraged patientand wifeto seekreferral from primary care provider to [facility] care management if deemednecessaryand  appropriate for services in the near future.  [facility] care management information was provided for future needs thathemay have.  Patientwould only opt and verbally agree forEMMI calls to follow-upwith his recovery at home.  Referral made for [facility] General calls after discharge.   For additional questions please contact:  Lawrence [name], BSN, RN-BC [facility] PRIMARY CARE Navigator Cell: [phone]

## 2018-07-31 NOTE — Discharge Summary (Signed)
Physician Discharge Summary  Lawrence [name] YTK:[id number] DOB: 07/12/1946 DOA: 07/21/2018  PCP: Lawrence Sheriff, MD  Admit date: 07/21/2018 Discharge date: 07/31/2018  Time spent: 60 minutes  Recommendations for Outpatient Follow-up:  1. Follow-up with Dr. Brantley [name], general surgery in 3 weeks for postop follow-up. 2. Follow-up with Lawrence Larsen II, MD in 2 to 3 weeks.  On follow-up patient will need a basic metabolic profile done to follow-up on electrolytes and renal function.  Patient will need a CBC done to follow-up on H&H.   Discharge Diagnoses:  Principal Problem:   Small bowel ischemia w pneumatosis s/p ileocectomy 07/21/2018 Active Problems:   COPD (chronic obstructive pulmonary disease) (HCC)   Morbid obesity (HCC)   CHF (congestive heart failure), NYHA class [name], chronic, diastolic (HCC)   GI bleed   CKD (chronic kidney disease) [name] 3, GFR 30-59 ml/min (HCC)   Permanent atrial fibrillation ([place])   Discharge Condition: Stable and improved  Diet recommendation: Heart healthy  Filed Weights   07/28/18 0600 07/29/18 0500 07/30/18 0500  Weight: 105.7 kg 110 kg 108.4 kg    History of present illness:  Per Dr. Jimmey [name] 72 year old male with PMH of COPD, DM, HLD, HTN, CAD, S/P Pacemaker, A.Fib on Eliquis, Diastolic HF, OSA on CPAP at HS, H/O AVM Colonic Polys and Moderate Sigmoid Diverticulosis, GERD  Presented to [facility] ED on 7/29 with Abdominal Pain, Nausea, and reportedly two days of Hematochezia in which appeared black/tarry. Hemoglobin 8.8. Given 1 unit RBC at OSH. Eliquis reversed with Anti-Inhibitor Coagulant Complex. Patient transferred to [facility] for Further Evaluation.   On arrival to ICU patient was alert and oriented with systolic 557. On insulin gtt. Complaining of abdominal tenderness and nausea.    Hospital Course:  Patient was admitted by critical care service on 07/21/2018, per admission note by Ms. Eubanks 72 year old male with PMH of COPD,  DM, HLD, HTN, CAD, S/P Pacemaker, A.Fib on Eliquis, Diastolic HF, OSA on CPAP at HS, H/O AVM Colonic Polys and Moderate Sigmoid Diverticulosis, GERD.  Presents to [place] ED on 7/29 with Abdominal Pain, Nausea, and reportedly two days of Hematocheziain which appeared black/tarry. Hemoglobin 8.8. Given 1 unit RBC at OSH. Eliquis reversed with Anti-Inhibitor Coagulant Complex. Patient transferred to [facility] for Further Evaluation.  On arrival to ICU patient was alert and oriented with systolic 322. On insulin gtt. Complaining of abdominal tenderness and nausea.  Patient was admitted to ICU, found to have small bowel ischemia/pneumatosis, hematochezia, underwent exploratory laparotomy with small bowel resection and seek cecectomy on 07/21/2018 (Dr Lawrence [name]). He was extubated on 7/30. Patient was transferred to hospitalist service on 8/1 and [facility] assumed care on 8/1  1] small bowel ischemia/pneumatosis s/p exploratory laparotomy with small bowel resection and cecectomy 07/21/2018.- Patient had presented from outside hospital with concerns for initial GI bleed however patient had significant abdominal tenderness and pain with a noted leukocytosis, vomiting and elevated lactic level.  X-ray which was done on admission was concerning for possible small bowel obstruction/ileus.  Patient was seen in consultation by gastroenterology who had recommended CT abdomen and pelvis which was done.  CT abdomen and pelvis which was done was concerning for small bowel ischemia with mitosis.  General surgery was consulted sequently underwent exploratory laparotomy with small bowel resection and cecectomy 07/21/2018 per Dr. Brantley [name].  Patient tolerated procedure well.  Wound VAC was subsequently placed.  Patient's leukocytosis trended down.  Patient's diet was advanced and he tolerated a  soft diet by day of discharge.  Patient was resumed back on home dose Eliquis with no bleeding noted.  Patient will be discharged home with wound  VAC and is to follow-up with Dr. Brantley [name] general surgery in the outpatient setting.  Patient will be discharged in stable and improved condition.    2]DM  Postoperatively patient's home dose Levemir was resumed during the hospitalization and patient was also maintained on sliding scale insulin.  Outpatient follow-up.   3]diasolic chf- Patient's diuretics were held during the hospitalization due to presentation with small bowel ischemia and concerns for GI bleed.  Patient remained euvolemic throughout the hospitalization.  Patient received a dose of Lasix on 07/28/2018.  Patient diuretics will be resumed on discharge.  Outpatient follow-up.   4]PAfib-patient was rate controlled during the hospitalization.  Patient's anticoagulation of Eliquis was initially held and reversed on presentation to the outside hospital with concerns for GI bleed.  Patient's hemoglobin stabilized.  Patient was followed by gastroenterology during the hospitalization who felt anemia had stabilized.  Patient underwent surgery for small bowel ischemia and tolerated surgery well.  Eliquis was subsequently resumed and patient had no further bleeding.  Hemoglobin stabilized at 8.9.  Outpatient follow-up.  5]Acute blood loss anemia, iron deficiency -Likely due to #1, anticoagulation was initially held.  Anticoagulation was subsequently reversed.  Patient was seen in consultation by gastroenterology and it was felt that patient's GI bleeding was stable at that point in time.  Hemoglobin dropped as low as 7.7 on 07/28/2018 and patient transfused a unit packed red blood cells on 07/29/2018.  Hemoglobin stabilized at 8.9 by day of discharge.  Outpatient follow-up.   Procedures:  CT abdomen and pelvis 07/21/2018--small bowel ischemia with pneumatosis within loops of bowel in the left upper quadrant and gas within mesenteric veins.  Chest x-ray 07/22/2018, 07/21/2018, 07/29/2018  Terms of the lumbar spine 07/25/2018  Plain films of the  T-spine 07/25/2018  Abdominal films 07/27/2018, 07/28/2018  2D echo 07/23/2018  Exploratory laparotomy small bowel resection and cecectomy by Dr. Brantley [name] 07/21/2018  Consultations:  Gastroenterology: Dr. Havery Moros 07/21/2018  General surgery: Dr. Brantley [name] 07/21/2018  Discharge Exam: Vitals:   07/31/18 0531 07/31/18 1439  BP: 130/61 (!) 147/64  Pulse: 78 79  Resp: 18 18  Temp: 98.6 F (37 C) 98.2 F (36.8 C)  SpO2: 97% 100%    General: NAD Cardiovascular: RRR Respiratory: CTAB GI: Abdomen is soft, nontender, nondistended, positive bowel sounds.  Wound VAC in place.  Discharge Instructions   Discharge Instructions    Diet - low sodium heart healthy   Complete by:  As directed    Increase activity slowly   Complete by:  As directed      Allergies as of 07/31/2018      Reactions   Bactrim [sulfamethoxazole-trimethoprim] Other (See Comments)   Heart to sleep, renal failure, potassium level spiked      Medication List    TAKE these medications   acetaminophen 500 MG tablet Commonly known as:  TYLENOL Take 2,000 mg by mouth every 6 (six) hours as needed for moderate pain or headache.   COMBIVENT RESPIMAT 20-100 MCG/ACT Aers respimat Generic drug:  Ipratropium-Albuterol Inhale 1 puff into the lungs daily as needed for wheezing.   DUONEB 0.5-2.5 (3) MG/3ML Soln Generic drug:  ipratropium-albuterol Take 3 mLs by nebulization every 6 (six) hours as needed (for shortness of breath or wheezing).   DRY EYE FORMULA PO Place 2 drops into both eyes daily as needed (for  dry eyes).   ELIQUIS 5 MG Tabs tablet Generic drug:  apixaban Take 5 mg by mouth 2 (two) times daily.   furosemide 40 MG tablet Commonly known as:  LASIX Take 40 mg by mouth 2 (two) times daily.   hydrocortisone 2.5 % rectal cream Commonly known as:  ANUSOL-HC Apply 1 application topically 4 (four) times daily as needed for hemorrhoids.   insulin glargine 100 unit/mL Sopn Commonly known as:   LANTUS Inject 2-10 Units into the skin daily as needed (for BS > 110 per sliding scale).   metolazone 2.5 MG tablet Commonly known as:  ZAROXOLYN Take 1 tablet (2.5 mg total) by mouth every Monday, Wednesday, and Friday. Start taking on:  08/01/2018   nitroGLYCERIN 0.4 MG SL tablet Commonly known as:  NITROSTAT Place 0.4 mg under the tongue every 5 (five) minutes as needed for chest pain.   pantoprazole 40 MG tablet Commonly known as:  PROTONIX Take 40 mg by mouth daily before breakfast.   simvastatin 20 MG tablet Commonly known as:  ZOCOR Take 20 mg by mouth at bedtime.   traMADol 50 MG tablet Commonly known as:  ULTRAM Take 1 tablet (50 mg total) by mouth every 6 (six) hours as needed for moderate pain.   vitamin B-12 1000 MCG tablet Commonly known as:  CYANOCOBALAMIN Take 1,000 mcg by mouth daily.   Vitamin D3 2000 units Tabs Take 2,000 Units by mouth daily.      Allergies  Allergen Reactions  . Bactrim [Sulfamethoxazole-Trimethoprim] Other (See Comments)    Heart to sleep, renal failure, potassium level spiked   Follow-up Information    Cornett, Thomas, MD. Schedule an appointment as soon as possible for a visit in 3 week(s).   Specialty:  General Surgery Why:  for post-operative follow up.  Contact information: [address] [place] [address] [phone]        Lawrence Sheriff, MD. Schedule an appointment as soon as possible for a visit in 2 week(s).   Specialty:  Family Medicine Why:  F/U IN 2-3 WEEKS. Contact information: [place] [address] [phone]            The results of significant diagnostics from this hospitalization (including imaging, microbiology, ancillary and laboratory) are listed below for reference.    Significant Diagnostic Studies: Ct Abdomen Pelvis Wo Contrast  Result Date: 07/21/2018 CLINICAL DATA:  Abdominal pain. Vomiting. Elevated white blood cell count and lactic acid EXAM:  CT ABDOMEN AND PELVIS WITHOUT CONTRAST TECHNIQUE: Multidetector CT imaging of the abdomen and pelvis was performed following the standard protocol without IV contrast. COMPARISON:  Plain film 7299 FINDINGS: Lower chest:  Lung bases are clear. Hepatobiliary: Liver is mildly nodular. Postcholecystectomy. No biliary duct dilatation. Pancreas: Normal pancreatic parenchymal intensity. No ductal dilatation or inflammation. Spleen: Normal spleen. Adrenals/urinary tract: Adrenal glands and kidneys are normal. Normal bladder. Stomach/Bowel: Stomach is normal. Duodenum is normal. In the mid small bowel there is stasis with fecalization ofinternal contents. There is pneumatosis within the wall of the small bowel centrally in the upper abdomen (image 36/3). Additionally, there is portal venous gas veins of the mesentery of the upper small bowel as seen in LEFT upper quadrant (image 44/3). Findings are consistent with small bowel ischemia. No obstructing lesion identified. Mild caliber change from proximal to distal small bowel. Vascular/Lymphatic: Venous mesenteric gas. No gas within the portal veins. Heavy calcification of the abdominal aorta. Calcifications at the ostia of  the SMA and celiac trunk. Musculoskeletal: No aggressive osseous lesion Reproductive: Prostate normal. Small amount free fluid the pelvis. IMPRESSION: 1. Small bowel ischemia with pneumatosis within loops of bowel in the LEFT upper quadrant and gas within mesenteric veins. 2. No portal venous gas in the liver. 3. No evidence of high-grade bowel obstruction. There is some fecalization of enteric contents of the proximal small bowel. 4. Aortic Atherosclerosis (ICD10-I70.0). Calcifications about the ostia of the SMA and celiac trunk, patency cannot be evaluated on noncontrast exam. Critical Value/emergent results were called by telephone at the time of interpretation on 07/21/2018 at 10:13 am to Dr. Kingston Cellar , who verbally acknowledged these results.  Electronically Signed   By: Suzy Bouchard M.D.   On: 07/21/2018 10:17   Dg Thoracic Spine W/swimmers  Result Date: 07/25/2018 CLINICAL DATA:  Upper back pain. EXAM: THORACIC SPINE - 3 VIEWS COMPARISON:  CT scan of April 10, 2018. FINDINGS: No fracture or spondylolisthesis is noted. Anterior osteophyte formation is seen involving the middle and lower thoracic spine. IMPRESSION: Multilevel degenerative disc disease. No acute abnormality seen in the thoracic spine. Electronically Signed   By: Marijo Conception, M.D.   On: 07/25/2018 12:19   Dg Lumbar Spine Complete  Result Date: 07/25/2018 CLINICAL DATA:  Lower back pain. EXAM: LUMBAR SPINE - COMPLETE 4+ VIEW COMPARISON:  CT scan of July 21, 2018. FINDINGS: No fracture or spondylolisthesis is noted. Mild degenerative disc disease is noted at L1-2, L2-3 and L4-5. Moderate degenerative disc disease is noted at L3-4. Atherosclerosis of abdominal aorta is noted. IMPRESSION: Multilevel degenerative disc disease. No acute abnormality seen in the lumbar spine. Aortic Atherosclerosis (ICD10-I70.0). Electronically Signed   By: Marijo Conception, M.D.   On: 07/25/2018 12:16   Dg Abd 1 View  Result Date: 07/27/2018 CLINICAL DATA:  Ileus EXAM: ABDOMEN - 1 VIEW COMPARISON:  07/25/2018 FINDINGS: Scattered large and small bowel gas is noted. Persistent small bowel dilatation is identified. Some colonic gas is seen in these changes may represent a partial small bowel obstruction. Degenerative changes of the lumbar spine are noted. No new focal abnormality is seen. IMPRESSION: Diffuse small-bowel dilatation likely related to a partial small bowel obstruction. Clinical correlation is recommended. Electronically Signed   By: Inez Catalina M.D.   On: 07/27/2018 15:26   Dg Chest Port 1 View  Result Date: 07/29/2018 CLINICAL DATA:  Leukocytosis. EXAM: PORTABLE CHEST 1 VIEW COMPARISON:  07/22/2018. FINDINGS: Cardiac pacer with lead tip over the right atrium right ventricle. Prior  CABG. Cardiomegaly with normal pulmonary vascularity. Low lung volumes with mild bibasilar atelectasis. No pleural effusion or pneumothorax. Small metallic densities noted over the right shoulder, possibly postsurgical. IMPRESSION: 1. Prior CABG. Cardiac pacer noted with lead tips projected over the right atrium right ventricle. Mild cardiomegaly. No pulmonary venous congestion. 2.  No acute pulmonary abnormality. Electronically Signed   By: Marcello Moores  Register   On: 07/29/2018 12:32   Dg Chest Port 1 View  Result Date: 07/22/2018 CLINICAL DATA:  Small bowel ischemia, shortness of breath, coronary artery disease, chronic ischemic heart disease, essential benign hypertension, type [name] diabetes mellitus, former smoker EXAM: PORTABLE CHEST 1 VIEW COMPARISON:  Portable exam 0502 hours compared to 07/21/2018 FINDINGS: Nasogastric tube extends into abdomen. RIGHT jugular central venous catheter tip projects over SVC near cavoatrial junction. LEFT subclavian sequential pacemaker leads project over RIGHT atrium and RIGHT ventricle, unchanged. Upper normal size of cardiac silhouette post CABG. Mediastinal contours and pulmonary vascularity normal. Improved  interstitial infiltrates question edema. No pleural effusion or pneumothorax. Bones demineralized. IMPRESSION: Probable mild pulmonary edema, slightly improved. Electronically Signed   By: Lavonia Dana M.D.   On: 07/22/2018 09:04   Dg Chest Port 1 View  Result Date: 07/21/2018 CLINICAL DATA:  Central line placement EXAM: PORTABLE CHEST 1 VIEW COMPARISON:  07/21/2018 at 12:26 a.m. FINDINGS: Right IJ central venous catheter tip is at the cavoatrial junction. Left chest wall pacemaker is unchanged. Sequelae of median sternotomy and CABG. Nasogastric tube side port is at the gastroesophageal junction. Mild pulmonary edema. IMPRESSION: 1. Right IJ CVC tip at the cavoatrial junction.  No pneumothorax. 2. Nasogastric tube side port at the gastroesophageal junction. Recommend  advancing by 5-7 cm. 3. Mild pulmonary edema. Electronically Signed   By: Ulyses Jarred M.D.   On: 07/21/2018 15:45   Dg Abd Portable 1v  Result Date: 07/28/2018 CLINICAL DATA:  Nasogastric tube placement. Recent partial bowel resection EXAM: PORTABLE ABDOMEN - 1 VIEW COMPARISON:  July 27, 2018 FINDINGS: Nasogastric tube tip and side port are in the stomach. There remain multiple loops of dilated small bowel without appreciable air-fluid levels. Air is seen and portions of the colon. No appreciable free air. IMPRESSION: Nasogastric tube tip and side port in stomach. Diffuse bowel dilatation remains. A degree of bowel obstruction cannot be excluded, although postoperative ileus may present in this manner. No free air demonstrable on this supine examination. Electronically Signed   By: Lowella Grip III M.D.   On: 07/28/2018 07:38   Dg Abd Portable 1v  Result Date: 07/21/2018 CLINICAL DATA:  Abdominal pain vomiting tonight. EXAM: PORTABLE ABDOMEN - 1 VIEW COMPARISON:  CT abdomen and pelvis December 01, 2012 FINDINGS: Moderate amount of retained large bowel stool. Moderate amount of retained large bowel stool. A few loops of gas distended small large bowel. Surgical clips in the included right abdomen compatible with cholecystectomy. Phleboliths in the pelvis. Mild vascular calcifications. Mild dextroscoliosis. Ankylosis of the sacroiliac joints. Osteopenia. IMPRESSION: Moderate amount of retained large bowel stool with mild probable ileus, less likely early small bowel obstruction. Electronically Signed   By: Elon Alas M.D.   On: 07/21/2018 06:14    Microbiology: Recent Results (from the past 240 hour(s))  Culture, Urine     Status: None   Collection Time: 07/30/18  5:56 AM  Result Value Ref Range Status   Specimen Description URINE, CLEAN CATCH  Final   Special Requests NONE  Final   Culture   Final    NO GROWTH Performed at [facility], [address] [place], [place]  [address]    Report Status 07/31/2018 FINAL  Final  C difficile quick scan w PCR reflex     Status: None   Collection Time: 07/30/18  2:05 PM  Result Value Ref Range Status   C Diff antigen NEGATIVE NEGATIVE Final   C Diff toxin NEGATIVE NEGATIVE Final   C Diff interpretation No C. difficile detected.  Final    Comment: Performed at [facility], [place] [address], [place],  [address]     Labs: Basic Metabolic Panel: Recent Labs  Lab 07/27/18 [phone] 07/28/18 0420 07/29/18 0503 07/30/18 0558 07/31/18 0453  NA 145 147* 142 141 141  K 3.7 3.7 3.5 3.5 3.5  CL 112* 112* 106 109 109  CO2 23 28 24 22 22   GLUCOSE 122* 122* 105* 86 107*  BUN 36* 29* 24* 22 20  CREATININE 1.49* 1.50* 1.39* 1.26* 1.29*  CALCIUM 9.0 9.1 8.8* 8.4* 8.4*  MG  --  2.0  --   --  1.8   Liver Function Tests: No results for input(s): AST, ALT, ALKPHOS, BILITOT, PROT, ALBUMIN in the last 168 hours. No results for input(s): LIPASE, AMYLASE in the last 168 hours. No results for input(s): AMMONIA in the last 168 hours. CBC: Recent Labs  Lab 07/27/18 0711 07/28/18 0420 07/28/18 1836 07/29/18 0503 07/30/18 1221 07/31/18 0453  WBC 13.6* 14.4*  --  15.2* 15.9* 12.3*  HGB 8.0* 7.7* 9.1* 8.9* 8.9* 8.9*  HCT 27.4* 26.2* 29.5* 28.1* 29.1* 28.9*  MCV 104.2* 102.7*  --  96.9 97.0 97.0  PLT 346 323  --  295 302 294   Cardiac Enzymes: No results for input(s): CKTOTAL, CKMB, CKMBINDEX, TROPONINI in the last 168 hours. BNP: BNP (last 3 results) Recent Labs    12/10/17 1458 01/02/18 1515 02/24/18 1304  BNP 1,148.5* 1,559.8* 370.5*    ProBNP (last 3 results) Recent Labs    01/31/18 1101 03/04/18 1025 05/16/18 1100  PROBNP 6,793* 7,996* 3,272*    CBG: Recent Labs  Lab 07/30/18 1949 07/30/18 2347 07/31/18 0358 07/31/18 0846 07/31/18 1219  GLUCAP 148* 171* 127* 94 129*       Signed:  Irine Seal MD.  [facility] 07/31/2018, 4:13 PM

## 2018-07-31 NOTE — Progress Notes (Signed)
[facility] Progress Note  10 Days Post-Op  Subjective: CC:  No complaints. Wants to go home. Tolerating PO. Having bowel function. Pain controlled. Mobilizing and doing leg exercises in his room.   Objective: Vital signs in last 24 hours: Temp:  [98.3 F (36.8 C)-98.6 F (37 C)] 98.6 F (37 C) (08/08 0531) Pulse Rate:  [78-79] 78 (08/08 0531) Resp:  [18-20] 18 (08/08 0531) BP: (109-138)/(53-61) 130/61 (08/08 0531) SpO2:  [97 %] 97 % (08/08 0531) Last BM Date: 07/30/18  Intake/Output from previous day: 08/07 0701 - 08/08 0700 In: 710 [P.O.:710] Out: 100 [Drains:100] Intake/Output this shift: No intake/output data recorded.  PE: Gen:  Alert, NAD, pleasant Pulm:  Normal effort Abd: Soft, non-tender, non-distended, bowel sounds present in all 4 quadrants, VAC in place with good seal  Skin: warm and dry, no rashes  Psych: A&Ox3   Lab Results:  Recent Labs    07/30/18 1221 07/31/18 0453  WBC 15.9* 12.3*  HGB 8.9* 8.9*  HCT 29.1* 28.9*  PLT 302 294   BMET Recent Labs    07/30/18 0558 07/31/18 0453  NA 141 141  K 3.5 3.5  CL 109 109  CO2 22 22  GLUCOSE 86 107*  BUN 22 20  CREATININE 1.26* 1.29*  CALCIUM 8.4* 8.4*   PT/INR No results for input(s): LABPROT, INR in the last 72 hours. CMP     Component Value Date/Time   NA 141 07/31/2018 0453   NA 137 05/16/2018 1100   NA 139 03/29/2015 0926   K 3.5 07/31/2018 0453   K 4.4 03/29/2015 0926   CL 109 07/31/2018 0453   CL 108 (H) 06/09/2013 1218   CO2 22 07/31/2018 0453   CO2 24 03/29/2015 0926   GLUCOSE 107 (H) 07/31/2018 0453   GLUCOSE 239 (H) 03/29/2015 0926   GLUCOSE 235 (H) 06/09/2013 1218   BUN 20 07/31/2018 0453   BUN 59 (H) 05/16/2018 1100   BUN 25.8 03/29/2015 0926   CREATININE 1.29 (H) 07/31/2018 0453   CREATININE 1.2 03/29/2015 0926   CALCIUM 8.4 (L) 07/31/2018 0453   CALCIUM 8.9 03/29/2015 0926   PROT 6.7 07/21/2018 0444   PROT 6.6 12/18/2017 1443   PROT 6.5 03/29/2015 0926    ALBUMIN 3.3 (L) 07/21/2018 0444   ALBUMIN 4.1 12/18/2017 1443   ALBUMIN 3.5 03/29/2015 0926   AST 49 (H) 07/21/2018 0444   AST 20 03/29/2015 0926   ALT 33 07/21/2018 0444   ALT 17 03/29/2015 0926   ALKPHOS 239 (H) 07/21/2018 0444   ALKPHOS 136 03/29/2015 0926   BILITOT 1.0 07/21/2018 0444   BILITOT 0.8 12/18/2017 1443   BILITOT 0.63 03/29/2015 0926   GFRNONAA 54 (L) 07/31/2018 0453   GFRAA >60 07/31/2018 0453   Lipase     Component Value Date/Time   LIPASE 60 (H) 07/21/2018 0444       Studies/Results: Dg Chest Port 1 View  Result Date: 07/29/2018 CLINICAL DATA:  Leukocytosis. EXAM: PORTABLE CHEST 1 VIEW COMPARISON:  07/22/2018. FINDINGS: Cardiac pacer with lead tip over the right atrium right ventricle. Prior CABG. Cardiomegaly with normal pulmonary vascularity. Low lung volumes with mild bibasilar atelectasis. No pleural effusion or pneumothorax. Small metallic densities noted over the right shoulder, possibly postsurgical. IMPRESSION: 1. Prior CABG. Cardiac pacer noted with lead tips projected over the right atrium right ventricle. Mild cardiomegaly. No pulmonary venous congestion. 2.  No acute pulmonary abnormality. Electronically Signed   By: Marcello Moores  Register   On: 07/29/2018  12:32    Anti-infectives: Anti-infectives (From admission, onward)   Start     Dose/Rate Route Frequency Ordered Stop   07/21/18 1315  cefoTEtan (CEFOTAN) 2 g in sodium chloride 0.9 % 100 mL IVPB  Status:  Discontinued     2 g 200 mL/hr over 30 Minutes Intravenous Every 12 hours 07/21/18 1309 07/25/18 1220   07/21/18 1315  cefoTEtan (CEFOTAN) 2 g in sodium chloride 0.9 % 100 mL IVPB     2 g 200 mL/hr over 30 Minutes Intravenous To Surgery 07/21/18 1312 07/21/18 1210     Assessment/Plan HTN HLD T2DM-Defer to primary medicine service. COPD Hx of CAD S/p pacemaker placement A.Fib on eliquis - last dose yesterday, reversal agent given, s/p 1 unit KPTW6/56 Chronic Diastolic CHF/Chronic  ischemic heart disease OSA on CPAP Hx of colonic polyps and moderate sigmoid diverticulosis GERD Non-toxic multinodular goiter AKI on CKD stage III - Cr1.26 Obesity- BMI 31.6 ABL anemia - stable  Small bowel ischemia with pneumatosis S/p exploratory laparotomy with small bowel resection and cecectomy 07/21/18 Dr. Brantley Stage Post-operative ileus -POD#10 - CLE75.1 improved from 15,afebrile- UA and CXR negative, C.Diff negative -SOFT diet - continue VAC - mobilize   ZGY:FVCB diet ID: cefotetan 7/29 >8/2 VTE: SCD's,eliquis Foley: none Follow up: Dr. Jeral Fruit for discharge from surgical perspective. Home VAC to be placed prior to discharge. Follow up with Dr. Brantley Stage.  DC instructions provided.   LOS: 10 days    Jill Alexanders , [facility] 07/31/2018, 9:18 AM Pager: [phone] Consults: [phone] Mon-Fri 7:00 am-4:30 pm Sat-Sun 7:00 am-11:30 am

## 2018-07-31 NOTE — Progress Notes (Signed)
Physical Therapy Treatment Patient Details Name: Lawrence [name] MRN: [id number] DOB: 1946/12/03 Today's Date: 07/31/2018    History of Present Illness Lawrence [name] is a 72 y.o. M s/p Exploratory laparotomy and small bowel resection and cecectomy. PMH includes T2DM, COPD, HLD, HTN, CAD s/p CABG, Pacemaker, A-FIb with Eliquis, CHF, OSA on CPAP, GERD, and Diverticulitis. Pt has a wound vac for his abdomen.      PT Comments    Pt  Doing well today, incr amb distance tolerance; he is hoping to go home today;   Follow Up Recommendations  Home health PT     Equipment Recommendations  None recommended by PT    Recommendations for Other Services       Precautions / Restrictions Precautions Precautions: Other (comment) Precaution Comments: Pt has a L LE hairline fracture, and per wife, previous notes wife stated pt not allowed to  amb without brace; today pt and wife state he is allowed shorter distance amb without brace, longer or uneven distances he needs brace --they report this be Dr. Sid Falcon recomendation (brace is here today, unable to put it on d/t bowel incontinence Required Braces or Orthoses: Other Brace/Splint Other Brace/Splint: VAC/ abd site     Mobility  Bed Mobility Overal bed mobility: Modified Independent Bed Mobility: Supine to Sit;Sit to Supine     Supine to sit: Modified independent (Device/Increase time) Sit to supine: Modified independent (Device/Increase time)   General bed mobility comments: partial roll, mod I, incr time needed, no physical assist, bed flat  Transfers Overall transfer level: Modified independent Equipment used: Rolling walker (2 wheeled) Transfers: Sit to/from Stand Sit to Stand: Modified independent (Device/Increase time)            Ambulation/Gait Ambulation/Gait assistance: Supervision;Modified independent (Device/Increase time) Gait Distance (Feet): 300 Feet Assistive device: Rolling walker (2 wheeled) Gait  Pattern/deviations: Step-through pattern;Decreased stride length     General Gait Details: cues for trunk extension   Stairs             Wheelchair Mobility    Modified Rankin (Stroke Patients Only)       Balance     Sitting balance-Leahy Scale: Good       Standing balance-Leahy Scale: Fair Standing balance comment: able to stand without UE support, able to shift out of BOS ~6-8" and tolerate min challenges                            Cognition Arousal/Alertness: Awake/alert Behavior During Therapy: WFL for tasks assessed/performed Overall Cognitive Status: Within Functional Limits for tasks assessed                                        Exercises General Exercises - Lower Extremity Ankle Circles/Pumps: AROM;Both;20 reps;Supine Heel Slides: Other (comment)(pt doing exercises on his own)    General Comments        Pertinent Vitals/Pain Pain Assessment: No/denies pain    Home Living                      Prior Function            PT Goals (current goals can now be found in the care plan section) Acute Rehab PT Goals Patient Stated Goal: To "Get out of here" PT Goal Formulation: With patient Time For Goal Achievement: 08/07/18  Potential to Achieve Goals: Good Progress towards PT goals: Progressing toward goals    Frequency    Min 2X/week      PT Plan Current plan remains appropriate    Co-evaluation              AM-PAC PT "6 Clicks" Daily Activity  Outcome Measure  Difficulty turning over in bed (including adjusting bedclothes, sheets and blankets)?: A Little Difficulty moving from lying on back to sitting on the side of the bed? : A Little Difficulty sitting down on and standing up from a chair with arms (e.g., wheelchair, bedside commode, etc,.)?: A Little Help needed moving to and from a bed to chair (including a wheelchair)?: A Little Help needed walking in hospital room?: A Little Help  needed climbing 3-5 steps with a railing? : A Little 6 Click Score: 18    End of Session   Activity Tolerance: Patient tolerated treatment well Patient left: in bed;with call bell/phone within reach Nurse Communication: Mobility status PT Visit Diagnosis: Other abnormalities of gait and mobility (R26.89);Muscle weakness (generalized) (M62.81);Difficulty in walking, not elsewhere classified (R26.2)     Time: 4314-2767 PT Time Calculation (min) (ACUTE ONLY): 13 min  Charges:  $Gait Training: 8-22 mins                     Kenyon Ana, PT Pager: [phone] 07/31/2018    [facility] 07/31/2018, 11:38 AM

## 2018-07-31 NOTE — Care Management Note (Addendum)
Case Management Note  Patient Details  Name: Lawrence [name] MRN: [id number] Date of Birth: [birth year]  Subjective/Objective:                    Action/Plan:DC summary faxed to Lawrence [name] at [facility]  Possible discharge to home today. Bedside will change VAC dressing prior to discharge. Home [facility] VAC is at bedside.   Lawrence [name] with [facility] aware possible discharge. Lawrence [name] asking if home health can change dressing tomorrow (08-01-18) or Monday ( 08-04-18), they do not change VAC's on Saturday or Sundays. Spoke with Lawrence [name] with [facility]. Per Lawrence [name] will need to change tomorrow Friday . Lawrence [name] with [facility] aware and will schedule home visit tomorrow. Expected Discharge Date:                  Expected Discharge Plan:  [place]  In-House Referral:     Discharge planning Services  CM Consult  Post Acute Care Choice:  Home Health, Durable Medical Equipment Choice offered to:  Spouse, Patient  DME Arranged:  Vac DME Agency:  [facility]  HH Arranged:  RN, PT [place] Agency:  [facility]  Status of Service:  In process, will continue to follow  If discussed at Long Length of Stay Meetings, dates discussed:    Additional Comments:  Lawrence Favre, RN 07/31/2018, 11:55 AM

## 2018-08-02 DIAGNOSIS — Z7901 Long term (current) use of anticoagulants: Secondary | ICD-10-CM | POA: Diagnosis not present

## 2018-08-02 DIAGNOSIS — J449 Chronic obstructive pulmonary disease, unspecified: Secondary | ICD-10-CM | POA: Diagnosis not present

## 2018-08-02 DIAGNOSIS — Z9181 History of falling: Secondary | ICD-10-CM | POA: Diagnosis not present

## 2018-08-02 DIAGNOSIS — I5032 Chronic diastolic (congestive) heart failure: Secondary | ICD-10-CM | POA: Diagnosis not present

## 2018-08-02 DIAGNOSIS — Z950 Presence of cardiac pacemaker: Secondary | ICD-10-CM | POA: Diagnosis not present

## 2018-08-02 DIAGNOSIS — Z794 Long term (current) use of insulin: Secondary | ICD-10-CM | POA: Diagnosis not present

## 2018-08-02 DIAGNOSIS — Z9049 Acquired absence of other specified parts of digestive tract: Secondary | ICD-10-CM | POA: Diagnosis not present

## 2018-08-02 DIAGNOSIS — Q2733 Arteriovenous malformation of digestive system vessel: Secondary | ICD-10-CM | POA: Diagnosis not present

## 2018-08-02 DIAGNOSIS — I480 Paroxysmal atrial fibrillation: Secondary | ICD-10-CM | POA: Diagnosis not present

## 2018-08-02 DIAGNOSIS — E669 Obesity, unspecified: Secondary | ICD-10-CM | POA: Diagnosis not present

## 2018-08-02 DIAGNOSIS — E1122 Type 2 diabetes mellitus with diabetic chronic kidney disease: Secondary | ICD-10-CM | POA: Diagnosis not present

## 2018-08-02 DIAGNOSIS — Z48815 Encounter for surgical aftercare following surgery on the digestive system: Secondary | ICD-10-CM | POA: Diagnosis not present

## 2018-08-02 DIAGNOSIS — N183 Chronic kidney disease, stage 3 (moderate): Secondary | ICD-10-CM | POA: Diagnosis not present

## 2018-08-02 DIAGNOSIS — Z7951 Long term (current) use of inhaled steroids: Secondary | ICD-10-CM | POA: Diagnosis not present

## 2018-08-02 DIAGNOSIS — Z8701 Personal history of pneumonia (recurrent): Secondary | ICD-10-CM | POA: Diagnosis not present

## 2018-08-02 DIAGNOSIS — Z87891 Personal history of nicotine dependence: Secondary | ICD-10-CM | POA: Diagnosis not present

## 2018-08-02 DIAGNOSIS — I130 Hypertensive heart and chronic kidney disease with heart failure and stage 1 through stage 4 chronic kidney disease, or unspecified chronic kidney disease: Secondary | ICD-10-CM | POA: Diagnosis not present

## 2018-08-02 DIAGNOSIS — D631 Anemia in chronic kidney disease: Secondary | ICD-10-CM | POA: Diagnosis not present

## 2018-08-02 DIAGNOSIS — G4733 Obstructive sleep apnea (adult) (pediatric): Secondary | ICD-10-CM | POA: Diagnosis not present

## 2018-08-02 DIAGNOSIS — Z6831 Body mass index (BMI) 31.0-31.9, adult: Secondary | ICD-10-CM | POA: Diagnosis not present

## 2018-08-02 DIAGNOSIS — I2510 Atherosclerotic heart disease of native coronary artery without angina pectoris: Secondary | ICD-10-CM | POA: Diagnosis not present

## 2018-08-04 DIAGNOSIS — I130 Hypertensive heart and chronic kidney disease with heart failure and stage 1 through stage 4 chronic kidney disease, or unspecified chronic kidney disease: Secondary | ICD-10-CM | POA: Diagnosis not present

## 2018-08-04 DIAGNOSIS — N183 Chronic kidney disease, stage 3 (moderate): Secondary | ICD-10-CM | POA: Diagnosis not present

## 2018-08-04 DIAGNOSIS — E1122 Type 2 diabetes mellitus with diabetic chronic kidney disease: Secondary | ICD-10-CM | POA: Diagnosis not present

## 2018-08-04 DIAGNOSIS — I5032 Chronic diastolic (congestive) heart failure: Secondary | ICD-10-CM | POA: Diagnosis not present

## 2018-08-04 DIAGNOSIS — Z48815 Encounter for surgical aftercare following surgery on the digestive system: Secondary | ICD-10-CM | POA: Diagnosis not present

## 2018-08-04 DIAGNOSIS — D631 Anemia in chronic kidney disease: Secondary | ICD-10-CM | POA: Diagnosis not present

## 2018-08-06 DIAGNOSIS — N183 Chronic kidney disease, stage 3 (moderate): Secondary | ICD-10-CM | POA: Diagnosis not present

## 2018-08-06 DIAGNOSIS — E1122 Type 2 diabetes mellitus with diabetic chronic kidney disease: Secondary | ICD-10-CM | POA: Diagnosis not present

## 2018-08-06 DIAGNOSIS — D631 Anemia in chronic kidney disease: Secondary | ICD-10-CM | POA: Diagnosis not present

## 2018-08-06 DIAGNOSIS — I130 Hypertensive heart and chronic kidney disease with heart failure and stage 1 through stage 4 chronic kidney disease, or unspecified chronic kidney disease: Secondary | ICD-10-CM | POA: Diagnosis not present

## 2018-08-06 DIAGNOSIS — Z48815 Encounter for surgical aftercare following surgery on the digestive system: Secondary | ICD-10-CM | POA: Diagnosis not present

## 2018-08-06 DIAGNOSIS — I5032 Chronic diastolic (congestive) heart failure: Secondary | ICD-10-CM | POA: Diagnosis not present

## 2018-08-07 DIAGNOSIS — E1142 Type 2 diabetes mellitus with diabetic polyneuropathy: Secondary | ICD-10-CM | POA: Diagnosis not present

## 2018-08-07 DIAGNOSIS — I10 Essential (primary) hypertension: Secondary | ICD-10-CM | POA: Diagnosis not present

## 2018-08-07 DIAGNOSIS — R197 Diarrhea, unspecified: Secondary | ICD-10-CM | POA: Diagnosis not present

## 2018-08-07 DIAGNOSIS — E860 Dehydration: Secondary | ICD-10-CM | POA: Diagnosis not present

## 2018-08-08 DIAGNOSIS — N183 Chronic kidney disease, stage 3 (moderate): Secondary | ICD-10-CM | POA: Diagnosis not present

## 2018-08-08 DIAGNOSIS — I5032 Chronic diastolic (congestive) heart failure: Secondary | ICD-10-CM | POA: Diagnosis not present

## 2018-08-08 DIAGNOSIS — Z48815 Encounter for surgical aftercare following surgery on the digestive system: Secondary | ICD-10-CM | POA: Diagnosis not present

## 2018-08-08 DIAGNOSIS — D631 Anemia in chronic kidney disease: Secondary | ICD-10-CM | POA: Diagnosis not present

## 2018-08-08 DIAGNOSIS — E1122 Type 2 diabetes mellitus with diabetic chronic kidney disease: Secondary | ICD-10-CM | POA: Diagnosis not present

## 2018-08-08 DIAGNOSIS — I130 Hypertensive heart and chronic kidney disease with heart failure and stage 1 through stage 4 chronic kidney disease, or unspecified chronic kidney disease: Secondary | ICD-10-CM | POA: Diagnosis not present

## 2018-08-11 DIAGNOSIS — Z48815 Encounter for surgical aftercare following surgery on the digestive system: Secondary | ICD-10-CM | POA: Diagnosis not present

## 2018-08-11 DIAGNOSIS — I130 Hypertensive heart and chronic kidney disease with heart failure and stage 1 through stage 4 chronic kidney disease, or unspecified chronic kidney disease: Secondary | ICD-10-CM | POA: Diagnosis not present

## 2018-08-11 DIAGNOSIS — N183 Chronic kidney disease, stage 3 (moderate): Secondary | ICD-10-CM | POA: Diagnosis not present

## 2018-08-11 DIAGNOSIS — E1122 Type 2 diabetes mellitus with diabetic chronic kidney disease: Secondary | ICD-10-CM | POA: Diagnosis not present

## 2018-08-11 DIAGNOSIS — I5032 Chronic diastolic (congestive) heart failure: Secondary | ICD-10-CM | POA: Diagnosis not present

## 2018-08-11 DIAGNOSIS — D631 Anemia in chronic kidney disease: Secondary | ICD-10-CM | POA: Diagnosis not present

## 2018-08-13 DIAGNOSIS — Z48815 Encounter for surgical aftercare following surgery on the digestive system: Secondary | ICD-10-CM | POA: Diagnosis not present

## 2018-08-13 DIAGNOSIS — N183 Chronic kidney disease, stage 3 (moderate): Secondary | ICD-10-CM | POA: Diagnosis not present

## 2018-08-13 DIAGNOSIS — E1122 Type 2 diabetes mellitus with diabetic chronic kidney disease: Secondary | ICD-10-CM | POA: Diagnosis not present

## 2018-08-13 DIAGNOSIS — I5032 Chronic diastolic (congestive) heart failure: Secondary | ICD-10-CM | POA: Diagnosis not present

## 2018-08-13 DIAGNOSIS — I130 Hypertensive heart and chronic kidney disease with heart failure and stage 1 through stage 4 chronic kidney disease, or unspecified chronic kidney disease: Secondary | ICD-10-CM | POA: Diagnosis not present

## 2018-08-13 DIAGNOSIS — D631 Anemia in chronic kidney disease: Secondary | ICD-10-CM | POA: Diagnosis not present

## 2018-08-14 ENCOUNTER — Encounter: Payer: Self-pay | Admitting: Cardiology

## 2018-08-14 DIAGNOSIS — N183 Chronic kidney disease, stage 3 (moderate): Secondary | ICD-10-CM | POA: Diagnosis not present

## 2018-08-14 DIAGNOSIS — I130 Hypertensive heart and chronic kidney disease with heart failure and stage 1 through stage 4 chronic kidney disease, or unspecified chronic kidney disease: Secondary | ICD-10-CM | POA: Diagnosis not present

## 2018-08-14 DIAGNOSIS — Z48815 Encounter for surgical aftercare following surgery on the digestive system: Secondary | ICD-10-CM | POA: Diagnosis not present

## 2018-08-14 DIAGNOSIS — D631 Anemia in chronic kidney disease: Secondary | ICD-10-CM | POA: Diagnosis not present

## 2018-08-14 DIAGNOSIS — E1122 Type 2 diabetes mellitus with diabetic chronic kidney disease: Secondary | ICD-10-CM | POA: Diagnosis not present

## 2018-08-14 DIAGNOSIS — I5032 Chronic diastolic (congestive) heart failure: Secondary | ICD-10-CM | POA: Diagnosis not present

## 2018-08-15 DIAGNOSIS — N183 Chronic kidney disease, stage 3 (moderate): Secondary | ICD-10-CM | POA: Diagnosis not present

## 2018-08-15 DIAGNOSIS — E1122 Type 2 diabetes mellitus with diabetic chronic kidney disease: Secondary | ICD-10-CM | POA: Diagnosis not present

## 2018-08-15 DIAGNOSIS — D631 Anemia in chronic kidney disease: Secondary | ICD-10-CM | POA: Diagnosis not present

## 2018-08-15 DIAGNOSIS — D500 Iron deficiency anemia secondary to blood loss (chronic): Secondary | ICD-10-CM | POA: Diagnosis not present

## 2018-08-15 DIAGNOSIS — I5032 Chronic diastolic (congestive) heart failure: Secondary | ICD-10-CM | POA: Diagnosis not present

## 2018-08-15 DIAGNOSIS — K922 Gastrointestinal hemorrhage, unspecified: Secondary | ICD-10-CM | POA: Diagnosis not present

## 2018-08-15 DIAGNOSIS — Z48815 Encounter for surgical aftercare following surgery on the digestive system: Secondary | ICD-10-CM | POA: Diagnosis not present

## 2018-08-15 DIAGNOSIS — I130 Hypertensive heart and chronic kidney disease with heart failure and stage 1 through stage 4 chronic kidney disease, or unspecified chronic kidney disease: Secondary | ICD-10-CM | POA: Diagnosis not present

## 2018-08-18 DIAGNOSIS — I130 Hypertensive heart and chronic kidney disease with heart failure and stage 1 through stage 4 chronic kidney disease, or unspecified chronic kidney disease: Secondary | ICD-10-CM | POA: Diagnosis not present

## 2018-08-18 DIAGNOSIS — D631 Anemia in chronic kidney disease: Secondary | ICD-10-CM | POA: Diagnosis not present

## 2018-08-18 DIAGNOSIS — Z48815 Encounter for surgical aftercare following surgery on the digestive system: Secondary | ICD-10-CM | POA: Diagnosis not present

## 2018-08-18 DIAGNOSIS — I5032 Chronic diastolic (congestive) heart failure: Secondary | ICD-10-CM | POA: Diagnosis not present

## 2018-08-18 DIAGNOSIS — N183 Chronic kidney disease, stage 3 (moderate): Secondary | ICD-10-CM | POA: Diagnosis not present

## 2018-08-18 DIAGNOSIS — E1122 Type 2 diabetes mellitus with diabetic chronic kidney disease: Secondary | ICD-10-CM | POA: Diagnosis not present

## 2018-08-20 DIAGNOSIS — I130 Hypertensive heart and chronic kidney disease with heart failure and stage 1 through stage 4 chronic kidney disease, or unspecified chronic kidney disease: Secondary | ICD-10-CM | POA: Diagnosis not present

## 2018-08-20 DIAGNOSIS — Z48815 Encounter for surgical aftercare following surgery on the digestive system: Secondary | ICD-10-CM | POA: Diagnosis not present

## 2018-08-20 DIAGNOSIS — D631 Anemia in chronic kidney disease: Secondary | ICD-10-CM | POA: Diagnosis not present

## 2018-08-20 DIAGNOSIS — N183 Chronic kidney disease, stage 3 (moderate): Secondary | ICD-10-CM | POA: Diagnosis not present

## 2018-08-20 DIAGNOSIS — E1122 Type 2 diabetes mellitus with diabetic chronic kidney disease: Secondary | ICD-10-CM | POA: Diagnosis not present

## 2018-08-20 DIAGNOSIS — I5032 Chronic diastolic (congestive) heart failure: Secondary | ICD-10-CM | POA: Diagnosis not present

## 2018-08-21 DIAGNOSIS — D500 Iron deficiency anemia secondary to blood loss (chronic): Secondary | ICD-10-CM | POA: Diagnosis not present

## 2018-08-21 DIAGNOSIS — I130 Hypertensive heart and chronic kidney disease with heart failure and stage 1 through stage 4 chronic kidney disease, or unspecified chronic kidney disease: Secondary | ICD-10-CM | POA: Diagnosis not present

## 2018-08-21 DIAGNOSIS — E1122 Type 2 diabetes mellitus with diabetic chronic kidney disease: Secondary | ICD-10-CM | POA: Diagnosis not present

## 2018-08-21 DIAGNOSIS — I5032 Chronic diastolic (congestive) heart failure: Secondary | ICD-10-CM | POA: Diagnosis not present

## 2018-08-21 DIAGNOSIS — N183 Chronic kidney disease, stage 3 (moderate): Secondary | ICD-10-CM | POA: Diagnosis not present

## 2018-08-21 DIAGNOSIS — Z48815 Encounter for surgical aftercare following surgery on the digestive system: Secondary | ICD-10-CM | POA: Diagnosis not present

## 2018-08-21 DIAGNOSIS — D631 Anemia in chronic kidney disease: Secondary | ICD-10-CM | POA: Diagnosis not present

## 2018-08-22 ENCOUNTER — Telehealth: Payer: Self-pay

## 2018-08-22 NOTE — Telephone Encounter (Signed)
I spoke with his wife and he still has a wound vac and is seeing his surgeon today.  She will call when they are ready to reschedule the colonoscopy.

## 2018-08-22 NOTE — Telephone Encounter (Signed)
-----   Message from Debbora Lacrosse, RN sent at 07/22/2018  3:04 PM EDT ----- Regarding: Colon Did he reschedule his colonoscopy?

## 2018-08-25 DIAGNOSIS — I130 Hypertensive heart and chronic kidney disease with heart failure and stage 1 through stage 4 chronic kidney disease, or unspecified chronic kidney disease: Secondary | ICD-10-CM | POA: Diagnosis not present

## 2018-08-25 DIAGNOSIS — Z48815 Encounter for surgical aftercare following surgery on the digestive system: Secondary | ICD-10-CM | POA: Diagnosis not present

## 2018-08-25 DIAGNOSIS — N183 Chronic kidney disease, stage 3 (moderate): Secondary | ICD-10-CM | POA: Diagnosis not present

## 2018-08-25 DIAGNOSIS — E1122 Type 2 diabetes mellitus with diabetic chronic kidney disease: Secondary | ICD-10-CM | POA: Diagnosis not present

## 2018-08-25 DIAGNOSIS — D631 Anemia in chronic kidney disease: Secondary | ICD-10-CM | POA: Diagnosis not present

## 2018-08-25 DIAGNOSIS — I5032 Chronic diastolic (congestive) heart failure: Secondary | ICD-10-CM | POA: Diagnosis not present

## 2018-08-26 ENCOUNTER — Encounter: Payer: Medicare Other | Admitting: Gastroenterology

## 2018-08-26 DIAGNOSIS — I130 Hypertensive heart and chronic kidney disease with heart failure and stage 1 through stage 4 chronic kidney disease, or unspecified chronic kidney disease: Secondary | ICD-10-CM | POA: Diagnosis not present

## 2018-08-26 DIAGNOSIS — I5032 Chronic diastolic (congestive) heart failure: Secondary | ICD-10-CM | POA: Diagnosis not present

## 2018-08-26 DIAGNOSIS — D631 Anemia in chronic kidney disease: Secondary | ICD-10-CM | POA: Diagnosis not present

## 2018-08-26 DIAGNOSIS — E1122 Type 2 diabetes mellitus with diabetic chronic kidney disease: Secondary | ICD-10-CM | POA: Diagnosis not present

## 2018-08-26 DIAGNOSIS — Z48815 Encounter for surgical aftercare following surgery on the digestive system: Secondary | ICD-10-CM | POA: Diagnosis not present

## 2018-08-26 DIAGNOSIS — N183 Chronic kidney disease, stage 3 (moderate): Secondary | ICD-10-CM | POA: Diagnosis not present

## 2018-08-27 ENCOUNTER — Ambulatory Visit (INDEPENDENT_AMBULATORY_CARE_PROVIDER_SITE_OTHER): Payer: Medicare Other | Admitting: *Deleted

## 2018-08-27 DIAGNOSIS — I130 Hypertensive heart and chronic kidney disease with heart failure and stage 1 through stage 4 chronic kidney disease, or unspecified chronic kidney disease: Secondary | ICD-10-CM | POA: Diagnosis not present

## 2018-08-27 DIAGNOSIS — I5032 Chronic diastolic (congestive) heart failure: Secondary | ICD-10-CM | POA: Diagnosis not present

## 2018-08-27 DIAGNOSIS — E1122 Type 2 diabetes mellitus with diabetic chronic kidney disease: Secondary | ICD-10-CM | POA: Diagnosis not present

## 2018-08-27 DIAGNOSIS — Z48815 Encounter for surgical aftercare following surgery on the digestive system: Secondary | ICD-10-CM | POA: Diagnosis not present

## 2018-08-27 DIAGNOSIS — N183 Chronic kidney disease, stage 3 (moderate): Secondary | ICD-10-CM | POA: Diagnosis not present

## 2018-08-27 DIAGNOSIS — D631 Anemia in chronic kidney disease: Secondary | ICD-10-CM | POA: Diagnosis not present

## 2018-08-27 DIAGNOSIS — R001 Bradycardia, unspecified: Secondary | ICD-10-CM

## 2018-08-27 DIAGNOSIS — I495 Sick sinus syndrome: Secondary | ICD-10-CM | POA: Diagnosis not present

## 2018-08-27 NOTE — Progress Notes (Signed)
Remote pacemaker transmission.   

## 2018-08-29 DIAGNOSIS — Z48815 Encounter for surgical aftercare following surgery on the digestive system: Secondary | ICD-10-CM | POA: Diagnosis not present

## 2018-08-29 DIAGNOSIS — D631 Anemia in chronic kidney disease: Secondary | ICD-10-CM | POA: Diagnosis not present

## 2018-08-29 DIAGNOSIS — I5032 Chronic diastolic (congestive) heart failure: Secondary | ICD-10-CM | POA: Diagnosis not present

## 2018-08-29 DIAGNOSIS — N183 Chronic kidney disease, stage 3 (moderate): Secondary | ICD-10-CM | POA: Diagnosis not present

## 2018-08-29 DIAGNOSIS — E1122 Type 2 diabetes mellitus with diabetic chronic kidney disease: Secondary | ICD-10-CM | POA: Diagnosis not present

## 2018-08-29 DIAGNOSIS — I130 Hypertensive heart and chronic kidney disease with heart failure and stage 1 through stage 4 chronic kidney disease, or unspecified chronic kidney disease: Secondary | ICD-10-CM | POA: Diagnosis not present

## 2018-09-02 DIAGNOSIS — I5032 Chronic diastolic (congestive) heart failure: Secondary | ICD-10-CM | POA: Diagnosis not present

## 2018-09-02 DIAGNOSIS — N183 Chronic kidney disease, stage 3 (moderate): Secondary | ICD-10-CM | POA: Diagnosis not present

## 2018-09-02 DIAGNOSIS — E1122 Type 2 diabetes mellitus with diabetic chronic kidney disease: Secondary | ICD-10-CM | POA: Diagnosis not present

## 2018-09-02 DIAGNOSIS — D631 Anemia in chronic kidney disease: Secondary | ICD-10-CM | POA: Diagnosis not present

## 2018-09-02 DIAGNOSIS — I130 Hypertensive heart and chronic kidney disease with heart failure and stage 1 through stage 4 chronic kidney disease, or unspecified chronic kidney disease: Secondary | ICD-10-CM | POA: Diagnosis not present

## 2018-09-02 DIAGNOSIS — Z48815 Encounter for surgical aftercare following surgery on the digestive system: Secondary | ICD-10-CM | POA: Diagnosis not present

## 2018-09-04 DIAGNOSIS — E1122 Type 2 diabetes mellitus with diabetic chronic kidney disease: Secondary | ICD-10-CM | POA: Diagnosis not present

## 2018-09-04 DIAGNOSIS — I5032 Chronic diastolic (congestive) heart failure: Secondary | ICD-10-CM | POA: Diagnosis not present

## 2018-09-04 DIAGNOSIS — I130 Hypertensive heart and chronic kidney disease with heart failure and stage 1 through stage 4 chronic kidney disease, or unspecified chronic kidney disease: Secondary | ICD-10-CM | POA: Diagnosis not present

## 2018-09-04 DIAGNOSIS — N183 Chronic kidney disease, stage 3 (moderate): Secondary | ICD-10-CM | POA: Diagnosis not present

## 2018-09-04 DIAGNOSIS — Z48815 Encounter for surgical aftercare following surgery on the digestive system: Secondary | ICD-10-CM | POA: Diagnosis not present

## 2018-09-04 DIAGNOSIS — D631 Anemia in chronic kidney disease: Secondary | ICD-10-CM | POA: Diagnosis not present

## 2018-09-05 DIAGNOSIS — I130 Hypertensive heart and chronic kidney disease with heart failure and stage 1 through stage 4 chronic kidney disease, or unspecified chronic kidney disease: Secondary | ICD-10-CM | POA: Diagnosis not present

## 2018-09-05 DIAGNOSIS — D631 Anemia in chronic kidney disease: Secondary | ICD-10-CM | POA: Diagnosis not present

## 2018-09-05 DIAGNOSIS — Z48815 Encounter for surgical aftercare following surgery on the digestive system: Secondary | ICD-10-CM | POA: Diagnosis not present

## 2018-09-05 DIAGNOSIS — I5032 Chronic diastolic (congestive) heart failure: Secondary | ICD-10-CM | POA: Diagnosis not present

## 2018-09-05 DIAGNOSIS — N183 Chronic kidney disease, stage 3 (moderate): Secondary | ICD-10-CM | POA: Diagnosis not present

## 2018-09-05 DIAGNOSIS — E1122 Type 2 diabetes mellitus with diabetic chronic kidney disease: Secondary | ICD-10-CM | POA: Diagnosis not present

## 2018-09-08 ENCOUNTER — Encounter: Payer: Self-pay | Admitting: Cardiology

## 2018-09-08 ENCOUNTER — Ambulatory Visit (INDEPENDENT_AMBULATORY_CARE_PROVIDER_SITE_OTHER): Payer: Medicare Other | Admitting: Cardiology

## 2018-09-08 VITALS — BP 140/72 | HR 67 | Ht 72.0 in | Wt 210.0 lb

## 2018-09-08 DIAGNOSIS — E1142 Type 2 diabetes mellitus with diabetic polyneuropathy: Secondary | ICD-10-CM | POA: Diagnosis not present

## 2018-09-08 DIAGNOSIS — I495 Sick sinus syndrome: Secondary | ICD-10-CM

## 2018-09-08 DIAGNOSIS — I471 Supraventricular tachycardia: Secondary | ICD-10-CM

## 2018-09-08 DIAGNOSIS — I480 Paroxysmal atrial fibrillation: Secondary | ICD-10-CM

## 2018-09-08 DIAGNOSIS — M8580 Other specified disorders of bone density and structure, unspecified site: Secondary | ICD-10-CM | POA: Diagnosis not present

## 2018-09-08 DIAGNOSIS — Z2821 Immunization not carried out because of patient refusal: Secondary | ICD-10-CM | POA: Diagnosis not present

## 2018-09-08 DIAGNOSIS — I10 Essential (primary) hypertension: Secondary | ICD-10-CM

## 2018-09-08 DIAGNOSIS — E785 Hyperlipidemia, unspecified: Secondary | ICD-10-CM | POA: Diagnosis not present

## 2018-09-08 DIAGNOSIS — I2510 Atherosclerotic heart disease of native coronary artery without angina pectoris: Secondary | ICD-10-CM | POA: Diagnosis not present

## 2018-09-08 DIAGNOSIS — Z6832 Body mass index (BMI) 32.0-32.9, adult: Secondary | ICD-10-CM | POA: Diagnosis not present

## 2018-09-08 DIAGNOSIS — M859 Disorder of bone density and structure, unspecified: Secondary | ICD-10-CM | POA: Diagnosis not present

## 2018-09-08 DIAGNOSIS — Z13820 Encounter for screening for osteoporosis: Secondary | ICD-10-CM | POA: Diagnosis not present

## 2018-09-08 NOTE — Patient Instructions (Addendum)
Medication Instructions:  Your physician recommends that you continue on your current medications as directed. Please refer to the Current Medication list given to you today.  *If you need a refill on your cardiac medications before your next appointment, please call your pharmacy*  Labwork: None ordered  Testing/Procedures: None ordered  Follow-Up: Remote monitoring is used to monitor your Pacemaker or ICD from home. This monitoring reduces the number of office visits required to check your device to one time per year. It allows [place] to keep an eye on the functioning of your device to ensure it is working properly. You are scheduled for a device check from home on 11/26/2018. You may send your transmission at any time that day. If you have a wireless device, the transmission will be sent automatically. After your physician reviews your transmission, you will receive a postcard with your next transmission date.  Your physician wants you to follow-up in: 6 months with Dr. Curt Bears.  You will receive a reminder letter in the mail two months in advance. If you don't receive a letter, please call our office to schedule the follow-up appointment.  Thank you for choosing [facility]!!   Trinidad Curet, RN [phone]

## 2018-09-08 NOTE — Progress Notes (Signed)
Electrophysiology Office Note   Date:  09/08/2018   ID:  TREVEON BOURCIER, DOB [birth year], MRN [id number]  PCP:  Lawrence Sheriff, MD  Cardiologist:  [name] Primary Electrophysiologist:  Lawrence Norbeck Meredith Leeds, MD    No chief complaint on file.    History of Present Illness: Lawrence [name] is a 72 y.o. male who is being seen today for the evaluation of sick sinus syndrome at the request of Lawrence Sheriff, MD. Presenting today for electrophysiology evaluation.  He has a history of coronary artery disease, paroxysmal atrial fibrillation, hypertension, hyperlipidemia, and chronic renal failure.  He presented to the hospital  in October 2018 and had a Lawrence [name] implanted 08/22/17.  Today, denies symptoms of palpitations, chest pain, shortness of breath, orthopnea, PND, lower extremity edema, claudication, dizziness, presyncope, syncope, bleeding, or neurologic sequela. The patient is tolerating medications without difficulties.  Overall he is doing well.  He had a hospitalization in July due to small bowel ischemia where he had a small bowel and partial cecum resection.  He is continuing to have GI issues but is doing much better.   Past Medical History:  Diagnosis Date  . Anemia   . Chronic airway obstruction, not elsewhere classified   . Chronic ischemic heart disease, unspecified   . Coronary artery disease   . Diabetes mellitus    type II, neuropathy,   . Essential hypertension, benign   . Hyperlipidemia, mixed   . Nodule of kidney    incidentally found on CT abdomen 11/2012.  Pending Urology evaluation.   . Nontoxic multinodular goiter   . Obesity   . Other testicular hypofunction   . PUD (peptic ulcer disease)   . Renal insufficiency, mild    hx of  . Sleep apnea    uses cpap   Past Surgical History:  Procedure Laterality Date  . CHOLECYSTECTOMY    . COLONOSCOPY  05/11/2015   Colonic polyps statuts post polypectomy. Moderate predominalty  sigmoid diverticulosis. Internal hemorrhoids. Tubular adenoma.   . CORONARY ARTERY BYPASS GRAFT  2007   x 4  . ESOPHAGOGASTRODUODENOSCOPY     10/06/2012:  hiatal hernia, moderate gastritis.  2012: Colonoscopy at [facility]:  one polyp.  Due next 2017  . EYE SURGERY    . KNEE DEBRIDEMENT    . LAPAROTOMY N/A 07/21/2018   Procedure: EXPLORATORY LAPAROTOMY WITH SMALL BOWEL RESECTION;  Surgeon: Erroll Luna, MD;  Location: [place];  Service: General;  Laterality: N/A;  . [name] IMPLANT N/A 08/22/2017   Procedure: [name] Implant;  Surgeon: Constance Haw, MD;  Location: [place] [facility];  Service: Cardiovascular;  Laterality: N/A;  . SHOULDER SURGERY     left shoulder  . TONSILECTOMY, ADENOIDECTOMY, BILATERAL MYRINGOTOMY AND TUBES       Current Outpatient Medications  Medication Sig Dispense Refill  . acetaminophen (TYLENOL) 500 MG tablet Take 2,000 mg by mouth every 6 (six) hours as needed for moderate pain or headache.    Marland Kitchen apixaban (ELIQUIS) 5 MG TABS tablet Take 5 mg by mouth 2 (two) times daily.    . Cholecalciferol (VITAMIN D3) 2000 units TABS Take 2,000 Units by mouth daily.    . Ipratropium-Albuterol (COMBIVENT RESPIMAT) 20-100 MCG/ACT AERS respimat Inhale 1 puff into the lungs daily as needed for wheezing.    Marland Kitchen ipratropium-albuterol (DUONEB) 0.5-2.5 (3) MG/3ML SOLN Take 3 mLs by nebulization every 6 (six) hours as needed (for shortness of breath or wheezing).     Marland Kitchen  linagliptin (TRADJENTA) 5 MG TABS tablet Take 5 mg by mouth daily.    . Multiple Vitamins-Minerals (DRY EYE FORMULA PO) Place 2 drops into both eyes daily as needed (for dry eyes).     . nitroGLYCERIN (NITROSTAT) 0.4 MG SL tablet Place 0.4 mg under the tongue every 5 (five) minutes as needed for chest pain.     . pantoprazole (PROTONIX) 40 MG tablet Take 40 mg by mouth daily before breakfast.     . simvastatin (ZOCOR) 20 MG tablet Take 20 mg by mouth at bedtime.     . vitamin B-12 (CYANOCOBALAMIN) 1000 MCG tablet Take  1,000 mcg by mouth daily.    . furosemide (LASIX) 40 MG tablet Take 40 mg by mouth 2 (two) times daily.     . hydrocortisone (ANUSOL-HC) 2.5 % rectal cream Apply 1 application topically 4 (four) times daily as needed for hemorrhoids. (Patient not taking: Reported on 09/08/2018) 30 g 0  . insulin glargine (LANTUS) 100 unit/mL SOPN Inject 2-10 Units into the skin daily as needed (for BS > 110 per sliding scale).    . metolazone (ZAROXOLYN) 2.5 MG tablet Take 1 tablet (2.5 mg total) by mouth every Monday, Wednesday, and Friday. (Patient not taking: Reported on 09/08/2018)    . traMADol (ULTRAM) 50 MG tablet Take 1 tablet (50 mg total) by mouth every 6 (six) hours as needed for moderate pain. (Patient not taking: Reported on 09/08/2018) 30 tablet 0   No current facility-administered medications for this visit.     Allergies:   Sulfamethoxazole-trimethoprim; Ciprofloxacin; Lisinopril; and Naproxen   Social History:  The patient  reports that he quit smoking about 13 years ago. His smoking use included cigarettes. He has a 45.00 pack-year smoking history. He has quit using smokeless tobacco.  His smokeless tobacco use included chew. He reports that he drinks alcohol. He reports that he does not use drugs.   Family History:  The patient's family history includes Asthma in his sister; Cancer in his father and mother; Colon cancer in his mother; Emphysema in his unknown relative; Esophageal cancer in his father; Hypertension in his mother; Kidney cancer in his mother; Stroke in his father.   ROS:  Please see the history of present illness.   Otherwise, review of systems is positive for appetite change, nausea, diarrhea, back pain, easy bruising.   All other systems are reviewed and negative.   PHYSICAL EXAM: VS:  BP 140/72   Pulse 67   Ht 6' (1.829 m)   Wt 210 lb (95.3 kg)   SpO2 97%   BMI 28.48 kg/m  , BMI Body mass index is 28.48 kg/m. GEN: Well nourished, well developed, in no acute distress    HEENT: normal  Neck: no JVD, carotid bruits, or masses Cardiac: RRR; no murmurs, rubs, or gallops,no edema  Respiratory:  clear to auscultation bilaterally, normal work of breathing GI: soft, nontender, nondistended, + BS MS: no deformity or atrophy  Skin: warm and dry, device site well healed Neuro:  Strength and sensation are intact Psych: euthymic mood, full affect  EKG:  EKG is not ordered today. Personal review of the ekg ordered 07/27/18 shows sinus rhythm, anterior T wave abnormalities, rate 82, prolonged QTC  Personal review of the device interrogation today. Results in [place]: 01/03/2018: TSH 6.384 02/24/2018: B Natriuretic Peptide 370.5 05/16/2018: NT-Pro BNP 3,272 07/21/2018: ALT 33 07/31/2018: BUN 20; Creatinine, Ser 1.29; Hemoglobin 8.9; Magnesium 1.8; Platelets 294; Potassium 3.5; Sodium 141  Lipid Panel  No results found for: CHOL, TRIG, HDL, CHOLHDL, VLDL, LDLCALC, LDLDIRECT   Wt Readings from Last 3 Encounters:  09/08/18 210 lb (95.3 kg)  07/30/18 238 lb 15.7 oz (108.4 kg)  07/16/18 230 lb (104.3 kg)      Other studies Reviewed: Additional studies/ records that were reviewed today include: TTE 09/17/17  Review of the above records today demonstrates:  - Left ventricle: The cavity size was mildly dilated. Wall   thickness was normal. Systolic function was normal. The estimated   ejection fraction was in the range of 55% to 60%. Regional wall   motion abnormalities cannot be excluded. The study is not   technically sufficient to allow evaluation of LV diastolic   function. - Mitral valve: Calcified annulus. There was mild regurgitation. - Right ventricle: The cavity size was moderately dilated. - Tricuspid valve: There was moderate regurgitation. - Pulmonary arteries: PA peak pressure: 32 mm Hg (S).   ASSESSMENT AND PLAN:  1.  Sick sinus syndrome: Status post Saint Jude dual-chamber [name].  Device functioning appropriately.  He has had  some PMT and thus has had his PVR up lengthened.  No other changes.  2.  Paroxysmal atrial fibrillation: Currently on Eliquis.  He had had some atrial tachycardia.  He was previously on amiodarone but this was stopped due to elevated PFTs.  No changes.  This patients CHA2DS2-VASc Score and unadjusted Ischemic Stroke Rate (% per year) is equal to 4.8 % stroke rate/year from a score of 4  Above score calculated as 1 point each if present [CHF, HTN, DM, Vascular=MI/PAD/Aortic Plaque, Age if 65-74, or Male] Above score calculated as 2 points each if present [Age > 75, or Stroke/TIA/TE]   3.  Coronary artery disease: No current chest pain  4.  Hypertension: Mildly elevated.  He Kollin Udell check it at home.  5.  Hyperlipidemia: Continue statin  6.  Atrial tachycardia: None further noted since cardioversion  Current medicines are reviewed at length with the patient today.   The patient does not have concerns regarding his medicines.  The following changes were made today: None  Labs/ tests ordered today include:  No orders of the defined types were placed in this encounter.    Disposition:   FU with Roshun Klingensmith 6 months  Signed, Nimra Puccinelli Meredith Leeds, MD  09/08/2018 11:38 AM     [facility] [address] [place] [address] [phone] (office) [phone] (fax)

## 2018-09-09 LAB — CUP PACEART INCLINIC DEVICE CHECK
Battery Remaining Percentage: 95 %
Brady Statistic RA Percent Paced: 33 %
Brady Statistic RV Percent Paced: 7.4 %
Date Time Interrogation Session: 20190917130750
Implantable Lead Location: 753860
Implantable Pulse Generator Implant Date: 20180830
Lead Channel Impedance Value: 460 Ohm
Lead Channel Pacing Threshold Amplitude: 0.75 V
Lead Channel Pacing Threshold Pulse Width: 0.5 ms
Lead Channel Sensing Intrinsic Amplitude: 2.3 mV
Lead Channel Setting Pacing Amplitude: 1.625
Lead Channel Setting Sensing Sensitivity: 2 mV
MDC IDC LEAD IMPLANT DT: 20180830
MDC IDC LEAD IMPLANT DT: 20180830
MDC IDC LEAD LOCATION: 753859
MDC IDC MSMT BATTERY REMAINING LONGEVITY: 109 mo
MDC IDC MSMT BATTERY VOLTAGE: 3.01 V
MDC IDC MSMT LEADCHNL RA IMPEDANCE VALUE: 430 Ohm
MDC IDC MSMT LEADCHNL RA PACING THRESHOLD PULSEWIDTH: 0.5 ms
MDC IDC MSMT LEADCHNL RV PACING THRESHOLD AMPLITUDE: 1 V
MDC IDC MSMT LEADCHNL RV SENSING INTR AMPL: 11.8 mV
MDC IDC SET LEADCHNL RV PACING AMPLITUDE: 2.5 V
MDC IDC SET LEADCHNL RV PACING PULSEWIDTH: 0.5 ms
Pulse Gen Model: 2272
Pulse Gen Serial Number: 8939815

## 2018-09-10 DIAGNOSIS — I5032 Chronic diastolic (congestive) heart failure: Secondary | ICD-10-CM | POA: Diagnosis not present

## 2018-09-10 DIAGNOSIS — E1122 Type 2 diabetes mellitus with diabetic chronic kidney disease: Secondary | ICD-10-CM | POA: Diagnosis not present

## 2018-09-10 DIAGNOSIS — Z48815 Encounter for surgical aftercare following surgery on the digestive system: Secondary | ICD-10-CM | POA: Diagnosis not present

## 2018-09-10 DIAGNOSIS — D631 Anemia in chronic kidney disease: Secondary | ICD-10-CM | POA: Diagnosis not present

## 2018-09-10 DIAGNOSIS — I130 Hypertensive heart and chronic kidney disease with heart failure and stage 1 through stage 4 chronic kidney disease, or unspecified chronic kidney disease: Secondary | ICD-10-CM | POA: Diagnosis not present

## 2018-09-10 DIAGNOSIS — N183 Chronic kidney disease, stage 3 (moderate): Secondary | ICD-10-CM | POA: Diagnosis not present

## 2018-09-16 DIAGNOSIS — D631 Anemia in chronic kidney disease: Secondary | ICD-10-CM | POA: Diagnosis not present

## 2018-09-16 DIAGNOSIS — E1122 Type 2 diabetes mellitus with diabetic chronic kidney disease: Secondary | ICD-10-CM | POA: Diagnosis not present

## 2018-09-16 DIAGNOSIS — I130 Hypertensive heart and chronic kidney disease with heart failure and stage 1 through stage 4 chronic kidney disease, or unspecified chronic kidney disease: Secondary | ICD-10-CM | POA: Diagnosis not present

## 2018-09-16 DIAGNOSIS — Z48815 Encounter for surgical aftercare following surgery on the digestive system: Secondary | ICD-10-CM | POA: Diagnosis not present

## 2018-09-16 DIAGNOSIS — N183 Chronic kidney disease, stage 3 (moderate): Secondary | ICD-10-CM | POA: Diagnosis not present

## 2018-09-16 DIAGNOSIS — I5032 Chronic diastolic (congestive) heart failure: Secondary | ICD-10-CM | POA: Diagnosis not present

## 2018-09-18 DIAGNOSIS — I130 Hypertensive heart and chronic kidney disease with heart failure and stage 1 through stage 4 chronic kidney disease, or unspecified chronic kidney disease: Secondary | ICD-10-CM | POA: Diagnosis not present

## 2018-09-18 DIAGNOSIS — E1122 Type 2 diabetes mellitus with diabetic chronic kidney disease: Secondary | ICD-10-CM | POA: Diagnosis not present

## 2018-09-18 DIAGNOSIS — N183 Chronic kidney disease, stage 3 (moderate): Secondary | ICD-10-CM | POA: Diagnosis not present

## 2018-09-18 DIAGNOSIS — D631 Anemia in chronic kidney disease: Secondary | ICD-10-CM | POA: Diagnosis not present

## 2018-09-18 DIAGNOSIS — Z48815 Encounter for surgical aftercare following surgery on the digestive system: Secondary | ICD-10-CM | POA: Diagnosis not present

## 2018-09-18 DIAGNOSIS — I5032 Chronic diastolic (congestive) heart failure: Secondary | ICD-10-CM | POA: Diagnosis not present

## 2018-09-22 DIAGNOSIS — N183 Chronic kidney disease, stage 3 (moderate): Secondary | ICD-10-CM | POA: Diagnosis not present

## 2018-09-22 DIAGNOSIS — Z48815 Encounter for surgical aftercare following surgery on the digestive system: Secondary | ICD-10-CM | POA: Diagnosis not present

## 2018-09-22 DIAGNOSIS — I130 Hypertensive heart and chronic kidney disease with heart failure and stage 1 through stage 4 chronic kidney disease, or unspecified chronic kidney disease: Secondary | ICD-10-CM | POA: Diagnosis not present

## 2018-09-22 DIAGNOSIS — E1122 Type 2 diabetes mellitus with diabetic chronic kidney disease: Secondary | ICD-10-CM | POA: Diagnosis not present

## 2018-09-22 DIAGNOSIS — I5032 Chronic diastolic (congestive) heart failure: Secondary | ICD-10-CM | POA: Diagnosis not present

## 2018-09-22 DIAGNOSIS — D631 Anemia in chronic kidney disease: Secondary | ICD-10-CM | POA: Diagnosis not present

## 2018-09-23 LAB — CUP PACEART REMOTE DEVICE CHECK
Battery Remaining Longevity: 119 mo
Battery Voltage: 3.01 V
Brady Statistic AP VP Percent: 1.8 %
Brady Statistic AS VP Percent: 1 %
Brady Statistic AS VS Percent: 51 %
Date Time Interrogation Session: 20190904215123
Implantable Lead Implant Date: 20180830
Implantable Lead Implant Date: 20180830
Implantable Lead Location: 753860
Lead Channel Impedance Value: 410 Ohm
Lead Channel Impedance Value: 450 Ohm
Lead Channel Pacing Threshold Amplitude: 0.625 V
Lead Channel Pacing Threshold Pulse Width: 0.5 ms
Lead Channel Setting Pacing Amplitude: 2.5 V
Lead Channel Setting Pacing Pulse Width: 0.5 ms
MDC IDC LEAD LOCATION: 753859
MDC IDC MSMT BATTERY REMAINING PERCENTAGE: 95.5 %
MDC IDC MSMT LEADCHNL RA PACING THRESHOLD PULSEWIDTH: 0.5 ms
MDC IDC MSMT LEADCHNL RA SENSING INTR AMPL: 1.5 mV
MDC IDC MSMT LEADCHNL RV PACING THRESHOLD AMPLITUDE: 0.75 V
MDC IDC MSMT LEADCHNL RV SENSING INTR AMPL: 10.1 mV
MDC IDC PG IMPLANT DT: 20180830
MDC IDC PG SERIAL: 8939815
MDC IDC SET LEADCHNL RA PACING AMPLITUDE: 1.625
MDC IDC SET LEADCHNL RV SENSING SENSITIVITY: 2 mV
MDC IDC STAT BRADY AP VS PERCENT: 46 %
MDC IDC STAT BRADY RA PERCENT PACED: 34 %
MDC IDC STAT BRADY RV PERCENT PACED: 8.1 %

## 2018-09-24 DIAGNOSIS — E1122 Type 2 diabetes mellitus with diabetic chronic kidney disease: Secondary | ICD-10-CM | POA: Diagnosis not present

## 2018-09-24 DIAGNOSIS — Z48815 Encounter for surgical aftercare following surgery on the digestive system: Secondary | ICD-10-CM | POA: Diagnosis not present

## 2018-09-24 DIAGNOSIS — I130 Hypertensive heart and chronic kidney disease with heart failure and stage 1 through stage 4 chronic kidney disease, or unspecified chronic kidney disease: Secondary | ICD-10-CM | POA: Diagnosis not present

## 2018-09-24 DIAGNOSIS — I5032 Chronic diastolic (congestive) heart failure: Secondary | ICD-10-CM | POA: Diagnosis not present

## 2018-09-24 DIAGNOSIS — N183 Chronic kidney disease, stage 3 (moderate): Secondary | ICD-10-CM | POA: Diagnosis not present

## 2018-09-24 DIAGNOSIS — D631 Anemia in chronic kidney disease: Secondary | ICD-10-CM | POA: Diagnosis not present

## 2018-10-01 DIAGNOSIS — D473 Essential (hemorrhagic) thrombocythemia: Secondary | ICD-10-CM | POA: Diagnosis not present

## 2018-10-01 DIAGNOSIS — D500 Iron deficiency anemia secondary to blood loss (chronic): Secondary | ICD-10-CM | POA: Diagnosis not present

## 2018-10-01 DIAGNOSIS — E876 Hypokalemia: Secondary | ICD-10-CM | POA: Diagnosis not present

## 2018-10-01 DIAGNOSIS — D72829 Elevated white blood cell count, unspecified: Secondary | ICD-10-CM | POA: Diagnosis not present

## 2018-10-30 ENCOUNTER — Encounter: Payer: Self-pay | Admitting: Cardiology

## 2018-10-30 ENCOUNTER — Ambulatory Visit (INDEPENDENT_AMBULATORY_CARE_PROVIDER_SITE_OTHER): Payer: Medicare Other | Admitting: Cardiology

## 2018-10-30 VITALS — BP 140/68 | HR 82 | Ht 72.0 in | Wt 211.0 lb

## 2018-10-30 DIAGNOSIS — I4821 Permanent atrial fibrillation: Secondary | ICD-10-CM

## 2018-10-30 DIAGNOSIS — I10 Essential (primary) hypertension: Secondary | ICD-10-CM

## 2018-10-30 DIAGNOSIS — I5033 Acute on chronic diastolic (congestive) heart failure: Secondary | ICD-10-CM | POA: Diagnosis not present

## 2018-10-30 DIAGNOSIS — K559 Vascular disorder of intestine, unspecified: Secondary | ICD-10-CM | POA: Diagnosis not present

## 2018-10-30 DIAGNOSIS — Z950 Presence of cardiac pacemaker: Secondary | ICD-10-CM

## 2018-10-30 NOTE — Patient Instructions (Signed)
Medication Instructions:  Your physician recommends that you continue on your current medications as directed. Please refer to the Current Medication list given to you today.  If you need a refill on your cardiac medications before your next appointment, please call your pharmacy.   Lab work: None.  If you have labs (blood work) drawn today and your tests are completely normal, you will receive your results only by: . MyChart Message (if you have MyChart) OR . A paper copy in the mail If you have any lab test that is abnormal or we need to change your treatment, we will call you to review the results.  Testing/Procedures: None.   Follow-Up: At [facility], you and your health needs are our priority.  As part of our continuing mission to provide you with exceptional heart care, we have created designated Provider Care Teams.  These Care Teams include your primary Cardiologist (physician) and Advanced Practice Providers (APPs -  Physician Assistants and Nurse Practitioners) who all work together to provide you with the care you need, when you need it. You will need a follow up appointment in 3 months.  Please call our office 2 months in advance to schedule this appointment.  You may see No primary care provider on file. or another member of our [facility] Provider Team in [place]: Brian Munley, MD . Rajan Revankar, MD  Any Other Special Instructions Will Be Listed Below (If Applicable).     

## 2018-10-30 NOTE — Progress Notes (Signed)
Cardiology Office Note:    Date:  10/30/2018   ID:  Lawrence [name], DOB Mar 26, 1946, MRN [id number]  PCP:  Lawrence Sheriff, MD  Cardiologist:  Lawrence Campus, MD    Referring MD: Lawrence Sheriff, MD   Chief Complaint  Patient presents with  . Follow-up  Doing well  History of Present Illness:    Lawrence [name] is a 72 y.o. male very complex past medical history which include coronary artery disease congestive heart failure hypertension proximal atrial fibrillation pacemaker implantation overall doing well gradually starts getting better after his abdominal surgery.  He started going back to gym and improving his strength.  Still complaining having weakness and fatigue but overall considering all the problems he is doing amazingly well  Past Medical History:  Diagnosis Date  . Anemia   . Chronic airway obstruction, not elsewhere classified   . Chronic ischemic heart disease, unspecified   . Coronary artery disease   . Diabetes mellitus    type II, neuropathy,   . Essential hypertension, benign   . Hyperlipidemia, mixed   . Nodule of kidney    incidentally found on CT abdomen 11/2012.  Pending Urology evaluation.   . Nontoxic multinodular goiter   . Obesity   . Other testicular hypofunction   . PUD (peptic ulcer disease)   . Renal insufficiency, mild    hx of  . Sleep apnea    uses cpap    Past Surgical History:  Procedure Laterality Date  . CHOLECYSTECTOMY    . COLONOSCOPY  05/11/2015   Colonic polyps statuts post polypectomy. Moderate predominalty sigmoid diverticulosis. Internal hemorrhoids. Tubular adenoma.   . CORONARY ARTERY BYPASS GRAFT  2007   x 4  . ESOPHAGOGASTRODUODENOSCOPY     10/06/2012:  hiatal hernia, moderate gastritis.  2012: Colonoscopy at [facility]:  one polyp.  Due next 2017  . EYE SURGERY    . KNEE DEBRIDEMENT    . LAPAROTOMY N/A 07/21/2018   Procedure: EXPLORATORY LAPAROTOMY WITH SMALL BOWEL RESECTION;  Surgeon: Erroll Luna, MD;  Location:  [place];  Service: General;  Laterality: N/A;  . PACEMAKER IMPLANT N/A 08/22/2017   Procedure: Pacemaker Implant;  Surgeon: Constance Haw, MD;  Location: [place] [facility];  Service: Cardiovascular;  Laterality: N/A;  . SHOULDER SURGERY     left shoulder  . TONSILECTOMY, ADENOIDECTOMY, BILATERAL MYRINGOTOMY AND TUBES      Current Medications: Current Meds  Medication Sig  . apixaban (ELIQUIS) 5 MG TABS tablet Take 5 mg by mouth 2 (two) times daily.  . Cholecalciferol (VITAMIN D3) 2000 units TABS Take 2,000 Units by mouth daily.  . furosemide (LASIX) 40 MG tablet Take 40 mg by mouth daily.   . insulin glargine (LANTUS) 100 unit/mL SOPN Inject 5-10 Units into the skin daily as needed (for BS > 110 per sliding scale).   . Ipratropium-Albuterol (COMBIVENT RESPIMAT) 20-100 MCG/ACT AERS respimat Inhale 1 puff into the lungs daily as needed for wheezing.  Marland Kitchen ipratropium-albuterol (DUONEB) 0.5-2.5 (3) MG/3ML SOLN Take 3 mLs by nebulization every 6 (six) hours as needed (for shortness of breath or wheezing).   Marland Kitchen linagliptin (TRADJENTA) 5 MG TABS tablet Take 5 mg by mouth daily.  . metolazone (ZAROXOLYN) 2.5 MG tablet Take 1 tablet (2.5 mg total) by mouth every Monday, Wednesday, and Friday.  . nitroGLYCERIN (NITROSTAT) 0.4 MG SL tablet Place 0.4 mg under the tongue every 5 (five) minutes as needed for chest pain.   Marland Kitchen  pantoprazole (PROTONIX) 40 MG tablet Take 40 mg by mouth daily before breakfast.   . simvastatin (ZOCOR) 20 MG tablet Take 20 mg by mouth at bedtime.      Allergies:   Sulfamethoxazole-trimethoprim; Ciprofloxacin; Lisinopril; and Naproxen   Social History   Socioeconomic History  . Marital status: Married    Spouse name: Not on file  . Number of children: 5  . Years of education: Not on file  . Highest education level: Not on file  Occupational History  . Occupation: Retired    Comment: Medical illustrator; retired Chiropodist  Social Needs  . Financial resource  strain: Not on file  . Food insecurity:    Worry: Not on file    Inability: Not on file  . Transportation needs:    Medical: Not on file    Non-medical: Not on file  Tobacco Use  . Smoking status: Former Smoker    Packs/day: 1.50    Years: 30.00    Pack years: 45.00    Types: Cigarettes    Last attempt to quit: 2006    Years since quitting: 13.8  . Smokeless tobacco: Former Systems developer    Types: Chew  Substance and Sexual Activity  . Alcohol use: Yes    Comment: rare  . Drug use: No  . Sexual activity: Not on file  Lifestyle  . Physical activity:    Days per week: Not on file    Minutes per session: Not on file  . Stress: Not on file  Relationships  . Social connections:    Talks on phone: Not on file    Gets together: Not on file    Attends religious service: Not on file    Active member of club or organization: Not on file    Attends meetings of clubs or organizations: Not on file    Relationship status: Not on file  Other Topics Concern  . Not on file  Social History Narrative   ** Merged History Encounter **       Lives with wife in a one story home.  Has 6 children.  Retired Nature conservation officer.  Education: college.     Family History: The patient's family history includes Asthma in his sister; Cancer in his father and mother; Colon cancer in his mother; Emphysema in his unknown relative; Esophageal cancer in his father; Hypertension in his mother; Kidney cancer in his mother; Stroke in his father. ROS:   Please see the history of present illness.    All 14 point review of systems negative except as described per history of present illness  EKGs/Labs/Other Studies Reviewed:      Recent Labs: 01/03/2018: TSH 6.384 02/24/2018: B Natriuretic Peptide 370.5 05/16/2018: NT-Pro BNP 3,272 07/21/2018: ALT 33 07/31/2018: BUN 20; Creatinine, Ser 1.29; Hemoglobin 8.9; Magnesium 1.8; Platelets 294; Potassium 3.5; Sodium 141  Recent Lipid Panel No results found for: CHOL, TRIG, HDL,  CHOLHDL, VLDL, LDLCALC, LDLDIRECT  Physical Exam:    VS:  BP 140/68   Pulse 82   Ht 6' (1.829 m)   Wt 211 lb (95.7 kg)   SpO2 98%   BMI 28.62 kg/m     Wt Readings from Last 3 Encounters:  10/30/18 211 lb (95.7 kg)  09/08/18 210 lb (95.3 kg)  07/30/18 238 lb 15.7 oz (108.4 kg)     GEN:  Well nourished, well developed in no acute distress HEENT: Normal NECK: No JVD; No carotid bruits LYMPHATICS: No lymphadenopathy CARDIAC: RRR, no murmurs, no  rubs, no gallops RESPIRATORY:  Clear to auscultation without rales, wheezing or rhonchi  ABDOMEN: Soft, non-tender, non-distended MUSCULOSKELETAL:  No edema; No deformity  SKIN: Warm and dry LOWER EXTREMITIES: no swelling NEUROLOGIC:  Alert and oriented x 3 PSYCHIATRIC:  Normal affect   ASSESSMENT:    1. Acute on chronic diastolic CHF (congestive heart failure) ([place])   2. Permanent atrial fibrillation   3. Pacemaker   4. Small bowel ischemia w pneumatosis s/p ileocectomy 07/21/2018   5. Essential hypertension, benign    PLAN:    In order of problems listed above:  1. Congestive heart failure doing well from that point review we will continue present management. 2. Atrial fibrillation we will continue anticoagulation. 3. Pacemaker follow-up by our EP clinic. 4. Small bowel status post resection recovering amazingly well 5. Essential hypertension blood pressure well controlled.   Medication Adjustments/Labs and Tests Ordered: Current medicines are reviewed at length with the patient today.  Concerns regarding medicines are outlined above.  No orders of the defined types were placed in this encounter.  Medication changes: No orders of the defined types were placed in this encounter.   Signed, Park Liter, MD, [facility] 10/30/2018 12:28 PM    [place]

## 2018-11-17 DIAGNOSIS — N179 Acute kidney failure, unspecified: Secondary | ICD-10-CM | POA: Diagnosis not present

## 2018-11-17 DIAGNOSIS — Z950 Presence of cardiac pacemaker: Secondary | ICD-10-CM | POA: Diagnosis not present

## 2018-11-17 DIAGNOSIS — E1122 Type 2 diabetes mellitus with diabetic chronic kidney disease: Secondary | ICD-10-CM | POA: Diagnosis not present

## 2018-11-17 DIAGNOSIS — I129 Hypertensive chronic kidney disease with stage 1 through stage 4 chronic kidney disease, or unspecified chronic kidney disease: Secondary | ICD-10-CM | POA: Diagnosis not present

## 2018-11-17 DIAGNOSIS — I509 Heart failure, unspecified: Secondary | ICD-10-CM | POA: Diagnosis not present

## 2018-11-17 DIAGNOSIS — I480 Paroxysmal atrial fibrillation: Secondary | ICD-10-CM | POA: Diagnosis not present

## 2018-11-17 DIAGNOSIS — D631 Anemia in chronic kidney disease: Secondary | ICD-10-CM | POA: Diagnosis not present

## 2018-11-17 DIAGNOSIS — Z7901 Long term (current) use of anticoagulants: Secondary | ICD-10-CM | POA: Diagnosis not present

## 2018-11-17 DIAGNOSIS — N183 Chronic kidney disease, stage 3 (moderate): Secondary | ICD-10-CM | POA: Diagnosis not present

## 2018-11-17 DIAGNOSIS — N189 Chronic kidney disease, unspecified: Secondary | ICD-10-CM | POA: Diagnosis not present

## 2018-11-17 DIAGNOSIS — K922 Gastrointestinal hemorrhage, unspecified: Secondary | ICD-10-CM | POA: Diagnosis not present

## 2018-11-26 ENCOUNTER — Ambulatory Visit (INDEPENDENT_AMBULATORY_CARE_PROVIDER_SITE_OTHER): Payer: Medicare Other

## 2018-11-26 DIAGNOSIS — I495 Sick sinus syndrome: Secondary | ICD-10-CM

## 2018-11-26 NOTE — Progress Notes (Signed)
Remote pacemaker transmission.   

## 2018-12-02 ENCOUNTER — Telehealth: Payer: Self-pay

## 2018-12-02 ENCOUNTER — Encounter: Payer: Self-pay | Admitting: Cardiology

## 2018-12-02 NOTE — Telephone Encounter (Signed)
Spoke with pt today regarding his sustained episodes of AF per his remote check. Per his remote check, pt has been in AF since October until present. Pt states he has been feeling ok but sometimes feels more winded and tired than usual. He states sometimes his ankles are swollen, but right now they are ok. I confirmed pt continues on his eliquis and lasix.   I advised pt I would send this alert info to Dr Curt Bears and his nurse for additional recommendations. Pt verbalized understanding and had no additional questions.

## 2018-12-03 DIAGNOSIS — K922 Gastrointestinal hemorrhage, unspecified: Secondary | ICD-10-CM | POA: Diagnosis not present

## 2018-12-03 DIAGNOSIS — D509 Iron deficiency anemia, unspecified: Secondary | ICD-10-CM | POA: Diagnosis not present

## 2018-12-03 DIAGNOSIS — Z9049 Acquired absence of other specified parts of digestive tract: Secondary | ICD-10-CM | POA: Diagnosis not present

## 2018-12-03 DIAGNOSIS — D500 Iron deficiency anemia secondary to blood loss (chronic): Secondary | ICD-10-CM | POA: Diagnosis not present

## 2018-12-03 NOTE — Telephone Encounter (Signed)
Needs follow up in clinic to discuss potential rhythm control.

## 2018-12-09 DIAGNOSIS — Z2821 Immunization not carried out because of patient refusal: Secondary | ICD-10-CM | POA: Diagnosis not present

## 2018-12-09 DIAGNOSIS — D510 Vitamin B12 deficiency anemia due to intrinsic factor deficiency: Secondary | ICD-10-CM | POA: Diagnosis not present

## 2018-12-09 DIAGNOSIS — R5383 Other fatigue: Secondary | ICD-10-CM | POA: Diagnosis not present

## 2018-12-09 DIAGNOSIS — Z79899 Other long term (current) drug therapy: Secondary | ICD-10-CM | POA: Diagnosis not present

## 2018-12-09 DIAGNOSIS — I10 Essential (primary) hypertension: Secondary | ICD-10-CM | POA: Diagnosis not present

## 2018-12-09 DIAGNOSIS — E1165 Type 2 diabetes mellitus with hyperglycemia: Secondary | ICD-10-CM | POA: Diagnosis not present

## 2018-12-09 DIAGNOSIS — E7439 Other disorders of intestinal carbohydrate absorption: Secondary | ICD-10-CM | POA: Diagnosis not present

## 2018-12-09 DIAGNOSIS — Z23 Encounter for immunization: Secondary | ICD-10-CM | POA: Diagnosis not present

## 2018-12-11 NOTE — Telephone Encounter (Signed)
Pt scheduled to discuss w/ Camnitz on 12/29/18 in [place]. Pt agreeable to plan.

## 2018-12-29 ENCOUNTER — Ambulatory Visit (INDEPENDENT_AMBULATORY_CARE_PROVIDER_SITE_OTHER): Payer: Medicare Other | Admitting: Cardiology

## 2018-12-29 ENCOUNTER — Encounter: Payer: Self-pay | Admitting: Cardiology

## 2018-12-29 VITALS — BP 128/62 | HR 119 | Ht 72.0 in | Wt 201.0 lb

## 2018-12-29 DIAGNOSIS — I2510 Atherosclerotic heart disease of native coronary artery without angina pectoris: Secondary | ICD-10-CM

## 2018-12-29 DIAGNOSIS — I10 Essential (primary) hypertension: Secondary | ICD-10-CM

## 2018-12-29 DIAGNOSIS — I480 Paroxysmal atrial fibrillation: Secondary | ICD-10-CM | POA: Diagnosis not present

## 2018-12-29 DIAGNOSIS — E785 Hyperlipidemia, unspecified: Secondary | ICD-10-CM

## 2018-12-29 DIAGNOSIS — I495 Sick sinus syndrome: Secondary | ICD-10-CM | POA: Diagnosis not present

## 2018-12-29 MED ORDER — METOPROLOL TARTRATE 25 MG PO TABS: 25.0000 mg | ORAL_TABLET | Freq: Two times a day (BID) | ORAL | 3 refills | Status: DC

## 2018-12-29 NOTE — Patient Instructions (Addendum)
Medication Instructions:  Your physician has recommended you make the following change in your medication:  1. START Metoprolol Tartrate (Lopressor) 25 mg twice a day  *If you need a refill on your cardiac medications before your next appointment, please call your pharmacy*  Labwork: None ordered  Testing/Procedures: Your physician has requested that you have cardiac CT within 7 days PRIOR to your ablation. Cardiac computed tomography (CT) is a painless test that uses an x-ray machine to take clear, detailed pictures of your heart. For further information please visit [email or web address]. Please follow instruction below located under special instructions area.  Your physician has recommended that you have an ablation. Catheter ablation is a medical procedure used to treat some cardiac arrhythmias (irregular heartbeats). During catheter ablation, a long, thin, flexible tube is put into a blood vessel in your groin (upper thigh), or neck. This tube is called an ablation catheter. It is then guided to your heart through the blood vessel. Radio frequency waves destroy small areas of heart tissue where abnormal heartbeats may cause an arrhythmia to start.   The office will contact you to arrange this procedure.  Instructions for your ablation: 1. Please arrive at the [facility], Main Entrance "A", of [facility] at _____  on ______. 2. Do not eat or drink after midnight the night prior to the procedure. 3. Do not miss any doses of ELIQUIS prior to the morning of the procedure.  4. Do not take any medications the morning of the procedure. 5. Both of your groins will need to be shaved for this procedure (if needed). We ask that you do this yourself at home 1-2 days prior to the procedure.  If you are unable/uncomfortable to do yourself, the hospital staff will shave you the day of your procedure (if needed). 6. Plan for an overnight stay in the hospital. 7. You will need someone to drive you  home at discharge.   Follow-Up: Remote monitoring is used to monitor your Pacemaker or ICD from home. This monitoring reduces the number of office visits required to check your device to one time per year. It allows [place] to keep an eye on the functioning of your device to ensure it is working properly. You are scheduled for a device check from home on 03/30/2019. You may send your transmission at any time that day. If you have a wireless device, the transmission will be sent automatically. After your physician reviews your transmission, you will receive a postcard with your next transmission date.  Post procedure follow up will be arranged once procedure is scheduled.  Thank you for choosing [facility]!!   Trinidad Curet, RN [phone]  Any Other Special Instructions Will Be Listed Below (If Applicable).   Please arrive at the [facility] main entrance of [facility] at xx:xx AM (30-45 minutes prior to test start time)  [facility] [address], [place] [address] [phone]  Proceed to the [facility] Radiology Department (First Floor).  Please follow these instructions carefully (unless otherwise directed):  Hold all erectile dysfunction medications at least 48 hours prior to test.  On the Night Before the Test: . Be sure to Drink plenty of water. . Do not consume any caffeinated/decaffeinated beverages or chocolate 12 hours prior to your test. . Do not take any antihistamines 12 hours prior to your test. . If you take Metformin do not take 24 hours prior to test.  On the Day of the Test: .  Drink plenty of water. Do not drink any water within one hour of the test. . Do not eat any food 4 hours prior to the test. . You may take your regular medications prior to the test.  . Take metoprolol (Lopressor) two hours prior to test. . HOLD Furosemide/Hydrochlorothiazide morning of the test.      After the Test: . Drink plenty of water. . After  receiving IV contrast, you may experience a mild flushed feeling. This is normal. . On occasion, you may experience a mild rash up to 24 hours after the test. This is not dangerous. If this occurs, you can take Benadryl 25 mg and increase your fluid intake. . If you experience trouble breathing, this can be serious. If it is severe call 911 IMMEDIATELY. If it is mild, please call our office. . If you take any of these medications: Glipizide/Metformin, Avandament, Glucavance, please do not take 48 hours after completing test.

## 2018-12-29 NOTE — Progress Notes (Signed)
Electrophysiology Office Note   Date:  12/29/2018   ID:  Lawrence [name], DOB Mar 19, 1946, MRN [id number]  PCP:  [name], MD  Cardiologist:  [name] Primary Electrophysiologist:  Kissie Ziolkowski Meredith Leeds, MD    No chief complaint on file.    History of Present Illness: Lawrence [name] is a 73 y.o. male who is being seen today for the evaluation of sick sinus syndrome at the request of [name], MD. Presenting today for electrophysiology evaluation.  He has a history of coronary artery disease, paroxysmal atrial fibrillation, hypertension, hyperlipidemia, and chronic renal failure.  He presented to the hospital  in October 2018 and had a [place] dual-chamber pacemaker implanted 08/22/17.  Today, denies symptoms of palpitations, chest pain, shortness of breath, orthopnea, PND, lower extremity edema, claudication, dizziness, presyncope, syncope, bleeding, or neurologic sequela. The patient is tolerating medications without difficulties.  Overall, his main complaint is of weakness and fatigue.  Device interrogation shows atrial fibrillation since October.  He was unaware that he was out of rhythm other than his weakness and fatigue.   Past Medical History:  Diagnosis Date  . Anemia   . Chronic airway obstruction, not elsewhere classified   . Chronic ischemic heart disease, unspecified   . Coronary artery disease   . Diabetes mellitus    type II, neuropathy,   . Essential hypertension, benign   . Hyperlipidemia, mixed   . Nodule of kidney    incidentally found on CT abdomen 11/2012.  Pending Urology evaluation.   . Nontoxic multinodular goiter   . Obesity   . Other testicular hypofunction   . PUD (peptic ulcer disease)   . Renal insufficiency, mild    hx of  . Sleep apnea    uses cpap   Past Surgical History:  Procedure Laterality Date  . CHOLECYSTECTOMY    . COLONOSCOPY  05/11/2015   Colonic polyps statuts post polypectomy. Moderate predominalty sigmoid  diverticulosis. Internal hemorrhoids. Tubular adenoma.   . CORONARY ARTERY BYPASS GRAFT  2007   x 4  . ESOPHAGOGASTRODUODENOSCOPY     10/06/2012:  hiatal hernia, moderate gastritis.  2012: Colonoscopy at [facility]:  one polyp.  Due next 2017  . EYE SURGERY    . KNEE DEBRIDEMENT    . LAPAROTOMY N/A 07/21/2018   Procedure: EXPLORATORY LAPAROTOMY WITH SMALL BOWEL RESECTION;  Surgeon: Erroll Luna, MD;  Location: [place];  Service: General;  Laterality: N/A;  . PACEMAKER IMPLANT N/A 08/22/2017   Procedure: Pacemaker Implant;  Surgeon: Constance Haw, MD;  Location: [place] [facility];  Service: Cardiovascular;  Laterality: N/A;  . SHOULDER SURGERY     left shoulder  . TONSILECTOMY, ADENOIDECTOMY, BILATERAL MYRINGOTOMY AND TUBES       Current Outpatient Medications  Medication Sig Dispense Refill  . apixaban (ELIQUIS) 5 MG TABS tablet Take 5 mg by mouth 2 (two) times daily.    . Cholecalciferol (VITAMIN D3) 2000 units TABS Take 2,000 Units by mouth daily.    . furosemide (LASIX) 40 MG tablet Take 40 mg by mouth daily.     . insulin glargine (LANTUS) 100 unit/mL SOPN Inject 5-10 Units into the skin daily as needed (for BS > 110 per sliding scale).     . Ipratropium-Albuterol (COMBIVENT RESPIMAT) 20-100 MCG/ACT AERS respimat Inhale 1 puff into the lungs daily as needed for wheezing.    Marland Kitchen ipratropium-albuterol (DUONEB) 0.5-2.5 (3) MG/3ML SOLN Take 3 mLs by nebulization every 6 (six) hours  as needed (for shortness of breath or wheezing).     Marland Kitchen linagliptin (TRADJENTA) 5 MG TABS tablet Take 5 mg by mouth daily.    . metolazone (ZAROXOLYN) 2.5 MG tablet Take 1 tablet (2.5 mg total) by mouth every Monday, Wednesday, and Friday.    . nitroGLYCERIN (NITROSTAT) 0.4 MG SL tablet Place 0.4 mg under the tongue every 5 (five) minutes as needed for chest pain.     . pantoprazole (PROTONIX) 40 MG tablet Take 40 mg by mouth daily before breakfast.     . simvastatin (ZOCOR) 20 MG tablet Take 20 mg by mouth at  bedtime.     . metoprolol tartrate (LOPRESSOR) 25 MG tablet Take 1 tablet (25 mg total) by mouth 2 (two) times daily. 60 tablet 3   No current facility-administered medications for this visit.     Allergies:   Ciprofloxacin; Sulfamethoxazole-trimethoprim; Lisinopril; and Naproxen   Social History:  The patient  reports that he quit smoking about 14 years ago. His smoking use included cigarettes. He has a 45.00 pack-year smoking history. He has quit using smokeless tobacco.  His smokeless tobacco use included chew. He reports current alcohol use. He reports that he does not use drugs.   Family History:  The patient's family history includes Asthma in his sister; Cancer in his father and mother; Colon cancer in his mother; Emphysema in his unknown relative; Esophageal cancer in his father; Hypertension in his mother; Kidney cancer in his mother; Stroke in his father.   ROS:  Please see the history of present illness.   Otherwise, review of systems is positive for none.   All other systems are reviewed and negative.   PHYSICAL EXAM: VS:  BP 128/62   Pulse (!) 119   Ht 6' (1.829 m)   Wt 201 lb (91.2 kg)   BMI 27.26 kg/m  , BMI Body mass index is 27.26 kg/m. GEN: Well nourished, well developed, in no acute distress  HEENT: normal  Neck: no JVD, carotid bruits, or masses Cardiac: iRRR; no murmurs, rubs, or gallops,no edema  Respiratory:  clear to auscultation bilaterally, normal work of breathing GI: soft, nontender, nondistended, + BS MS: no deformity or atrophy  Skin: warm and dry, device site well healed Neuro:  Strength and sensation are intact Psych: euthymic mood, full affect  EKG:  EKG is ordered today. Personal review of the ekg ordered shows atrial fibrillation, rate 119  Personal review of the device interrogation today. Results in [place]: 01/03/2018: TSH 6.384 02/24/2018: B Natriuretic Peptide 370.5 05/16/2018: NT-Pro BNP 3,272 07/21/2018: ALT 33 07/31/2018:  BUN 20; Creatinine, Ser 1.29; Hemoglobin 8.9; Magnesium 1.8; Platelets 294; Potassium 3.5; Sodium 141    Lipid Panel  No results found for: CHOL, TRIG, HDL, CHOLHDL, VLDL, LDLCALC, LDLDIRECT   Wt Readings from Last 3 Encounters:  12/29/18 201 lb (91.2 kg)  10/30/18 211 lb (95.7 kg)  09/08/18 210 lb (95.3 kg)      Other studies Reviewed: Additional studies/ records that were reviewed today include: TTE 09/17/17  Review of the above records today demonstrates:  - Left ventricle: The cavity size was mildly dilated. Wall   thickness was normal. Systolic function was normal. The estimated   ejection fraction was in the range of 55% to 60%. Regional wall   motion abnormalities cannot be excluded. The study is not   technically sufficient to allow evaluation of LV diastolic   function. - Mitral valve: Calcified annulus. There was  mild regurgitation. - Right ventricle: The cavity size was moderately dilated. - Tricuspid valve: There was moderate regurgitation. - Pulmonary arteries: PA peak pressure: 32 mm Hg (S).   ASSESSMENT AND PLAN:  1.  Sick sinus syndrome: Status post [place] dual-chamber pacemaker.  Functioning appropriately.  No changes.  2.  Paroxysmal atrial fibrillation: Currently on Eliquis.  Amiodarone was stopped due to abnormal PFTs.  Unfortunately he is back in atrial fibrillation and has been for the last few months.  He is having weakness and fatigue.  I spoke with him about the possibility of ablation.  Risks and benefits include bleeding, tamponade, heart block, stroke, damage to surrounding organs.  He understands these risks and is agreed to the procedure.  We Haili Donofrio start him on metoprolol 25 mg twice a day prior to his procedure for improved rate control.  This patients CHA2DS2-VASc Score and unadjusted Ischemic Stroke Rate (% per year) is equal to 4.8 % stroke rate/year from a score of 4  Above score calculated as 1 point each if present [CHF, HTN, DM,  Vascular=MI/PAD/Aortic Plaque, Age if 65-74, or Male] Above score calculated as 2 points each if present [Age > 75, or Stroke/TIA/TE]   3.  Coronary artery disease: No current chest pain  4.  Hypertension: Well-controlled  5.  Hyperlipidemia: Continue statin  6.  Atrial tachycardia: None further noted  Current medicines are reviewed at length with the patient today.   The patient does not have concerns regarding his medicines.  The following changes were made today: Start metoprolol  Labs/ tests ordered today include:  Orders Placed This Encounter  Procedures  . EKG 12-Lead   Case discussed with primary cardiologist  Disposition:   FU with Ashawn Rinehart 3 months  Signed, Lequan Dobratz Meredith Leeds, MD  12/29/2018 1:41 PM     [address] [place]  [address] [phone] (office) [phone] (fax)

## 2019-01-02 ENCOUNTER — Telehealth: Payer: Self-pay | Admitting: *Deleted

## 2019-01-02 DIAGNOSIS — I480 Paroxysmal atrial fibrillation: Secondary | ICD-10-CM

## 2019-01-02 NOTE — Telephone Encounter (Signed)
lmtcb to schedule AFib ablation

## 2019-01-07 ENCOUNTER — Encounter: Payer: Self-pay | Admitting: *Deleted

## 2019-01-07 NOTE — Telephone Encounter (Signed)
lmtcb

## 2019-01-07 NOTE — Telephone Encounter (Signed)
Wife returns my call (DPR on file) Afib ablation scheduled for 02/05/19 w/ Camnitz. Pt will stop by the [place] office on 2/4 for pre procedure lab work. Appt scheduled for H&P on 2/10 in [place]. Aware office will call to scheduled the cardiac CT prior to ablation. Letter of ablation instructions and ct instructions sent via [place]. Wife verbalized understanding and agreeable to plan.

## 2019-01-16 LAB — CUP PACEART REMOTE DEVICE CHECK
Battery Remaining Percentage: 95.5 %
Brady Statistic AP VS Percent: 30 %
Brady Statistic AS VP Percent: 2 %
Brady Statistic AS VS Percent: 61 %
Brady Statistic RV Percent Paced: 14 %
Date Time Interrogation Session: 20191203070014
Implantable Lead Implant Date: 20180830
Implantable Pulse Generator Implant Date: 20180830
Lead Channel Pacing Threshold Amplitude: 0.625 V
Lead Channel Pacing Threshold Amplitude: 1 V
Lead Channel Pacing Threshold Pulse Width: 0.5 ms
Lead Channel Sensing Intrinsic Amplitude: 12 mV
Lead Channel Sensing Intrinsic Amplitude: 2 mV
Lead Channel Setting Pacing Amplitude: 2.5 V
Lead Channel Setting Sensing Sensitivity: 2 mV
MDC IDC LEAD IMPLANT DT: 20180830
MDC IDC LEAD LOCATION: 753859
MDC IDC LEAD LOCATION: 753860
MDC IDC MSMT BATTERY REMAINING LONGEVITY: 120 mo
MDC IDC MSMT BATTERY VOLTAGE: 3.01 V
MDC IDC MSMT LEADCHNL RA IMPEDANCE VALUE: 490 Ohm
MDC IDC MSMT LEADCHNL RA PACING THRESHOLD PULSEWIDTH: 0.5 ms
MDC IDC MSMT LEADCHNL RV IMPEDANCE VALUE: 530 Ohm
MDC IDC SET LEADCHNL RA PACING AMPLITUDE: 1.625
MDC IDC SET LEADCHNL RV PACING PULSEWIDTH: 0.5 ms
MDC IDC STAT BRADY AP VP PERCENT: 2.3 %
MDC IDC STAT BRADY RA PERCENT PACED: 3 %
Pulse Gen Model: 2272
Pulse Gen Serial Number: 8939815

## 2019-01-27 ENCOUNTER — Telehealth (HOSPITAL_COMMUNITY): Payer: Self-pay | Admitting: Emergency Medicine

## 2019-01-27 ENCOUNTER — Encounter (HOSPITAL_COMMUNITY): Payer: Self-pay | Admitting: Emergency Medicine

## 2019-01-27 ENCOUNTER — Other Ambulatory Visit: Payer: Self-pay

## 2019-01-27 DIAGNOSIS — I10 Essential (primary) hypertension: Secondary | ICD-10-CM

## 2019-01-27 DIAGNOSIS — Z01812 Encounter for preprocedural laboratory examination: Secondary | ICD-10-CM | POA: Diagnosis not present

## 2019-01-27 NOTE — Telephone Encounter (Signed)
Reaching out to patient to offer assistance regarding upcoming cardiac imaging study; pt verbalizes understanding of appt date/time, parking situation and where to check in, pre-test NPO status and medications ordered, and verified current allergies; name and call back number provided for further questions should they arise Noor Vidales RN Navigator Cardiac Imaging [facility] [phone] office [phone] cell 

## 2019-01-28 ENCOUNTER — Telehealth: Payer: Self-pay | Admitting: *Deleted

## 2019-01-28 LAB — CBC
Hematocrit: 35.2 % — ABNORMAL LOW (ref 37.5–51.0)
Hemoglobin: 11.7 g/dL — ABNORMAL LOW (ref 13.0–17.7)
MCH: 29.8 pg (ref 26.6–33.0)
MCHC: 33.2 g/dL (ref 31.5–35.7)
MCV: 90 fL (ref 79–97)
PLATELETS: 349 10*3/uL (ref 150–450)
RBC: 3.93 x10E6/uL — ABNORMAL LOW (ref 4.14–5.80)
RDW: 13.5 % (ref 11.6–15.4)
WBC: 12.3 10*3/uL — ABNORMAL HIGH (ref 3.4–10.8)

## 2019-01-28 LAB — BASIC METABOLIC PANEL
BUN/Creatinine Ratio: 26 — ABNORMAL HIGH (ref 10–24)
BUN: 46 mg/dL — ABNORMAL HIGH (ref 8–27)
CHLORIDE: 97 mmol/L (ref 96–106)
CO2: 18 mmol/L — ABNORMAL LOW (ref 20–29)
Calcium: 9.2 mg/dL (ref 8.6–10.2)
Creatinine, Ser: 1.76 mg/dL — ABNORMAL HIGH (ref 0.76–1.27)
GFR, EST AFRICAN AMERICAN: 43 mL/min/{1.73_m2} — AB (ref >59)
GFR, EST NON AFRICAN AMERICAN: 38 mL/min/{1.73_m2} — AB (ref >59)
Glucose: 166 mg/dL — ABNORMAL HIGH (ref 65–99)
POTASSIUM: 4.7 mmol/L (ref 3.5–5.2)
SODIUM: 136 mmol/L (ref 134–144)

## 2019-01-28 NOTE — Telephone Encounter (Signed)
Patient notified of result.  Please refer to phone note from today for complete details.   Julaine Hua, [place] 01/28/2019 10:55 AM   DPR ok to s/w pt's wife. Pt's wife notified of lab results. Pt wife states Nephrology put pt back on Metolazone 2.5 mg twice a week and that may raise his creatinine. Pt's wife thanked me for the call. Results have been sent to [facility] .

## 2019-01-28 NOTE — Telephone Encounter (Signed)
-----   Message from Will Meredith Leeds, MD sent at 01/28/2019  9:01 AM EST ----- Creatinine mildly elevated otherwise stable labs.

## 2019-01-29 ENCOUNTER — Encounter (HOSPITAL_COMMUNITY): Payer: Self-pay

## 2019-01-29 ENCOUNTER — Ambulatory Visit (HOSPITAL_COMMUNITY): Payer: Medicare Other

## 2019-01-29 ENCOUNTER — Ambulatory Visit (HOSPITAL_COMMUNITY)
Admission: RE | Admit: 2019-01-29 | Discharge: 2019-01-29 | Disposition: A | Discharge status: Home / Self Care | Payer: Medicare Other | Source: Ambulatory Visit | Attending: Cardiology | Admitting: Cardiology

## 2019-01-29 DIAGNOSIS — I480 Paroxysmal atrial fibrillation: Secondary | ICD-10-CM

## 2019-01-30 ENCOUNTER — Telehealth (HOSPITAL_COMMUNITY): Payer: Self-pay | Admitting: Emergency Medicine

## 2019-01-30 ENCOUNTER — Other Ambulatory Visit: Payer: Self-pay | Admitting: *Deleted

## 2019-01-30 ENCOUNTER — Other Ambulatory Visit: Payer: Self-pay | Admitting: Cardiology

## 2019-01-30 DIAGNOSIS — I480 Paroxysmal atrial fibrillation: Secondary | ICD-10-CM

## 2019-01-30 NOTE — Telephone Encounter (Signed)
Left message on voicemail with name and callback number Montreal Steidle RN Navigator Cardiac Imaging [facility] [phone] Office [phone] Cell  

## 2019-02-02 ENCOUNTER — Ambulatory Visit (INDEPENDENT_AMBULATORY_CARE_PROVIDER_SITE_OTHER): Payer: Medicare Other | Admitting: Cardiology

## 2019-02-02 ENCOUNTER — Encounter: Payer: Self-pay | Admitting: Cardiology

## 2019-02-02 VITALS — BP 126/70 | HR 126 | Ht 72.0 in | Wt 200.0 lb

## 2019-02-02 DIAGNOSIS — I2510 Atherosclerotic heart disease of native coronary artery without angina pectoris: Secondary | ICD-10-CM

## 2019-02-02 DIAGNOSIS — I10 Essential (primary) hypertension: Secondary | ICD-10-CM

## 2019-02-02 DIAGNOSIS — I5032 Chronic diastolic (congestive) heart failure: Secondary | ICD-10-CM

## 2019-02-02 DIAGNOSIS — Z01812 Encounter for preprocedural laboratory examination: Secondary | ICD-10-CM | POA: Diagnosis not present

## 2019-02-02 DIAGNOSIS — I480 Paroxysmal atrial fibrillation: Secondary | ICD-10-CM

## 2019-02-02 LAB — BASIC METABOLIC PANEL
BUN / CREAT RATIO: 21 (ref 10–24)
BUN: 25 mg/dL (ref 8–27)
CO2: 25 mmol/L (ref 20–29)
Calcium: 9.8 mg/dL (ref 8.6–10.2)
Chloride: 101 mmol/L (ref 96–106)
Creatinine, Ser: 1.2 mg/dL (ref 0.76–1.27)
GFR calc Af Amer: 69 mL/min/{1.73_m2} (ref >59)
GFR calc non Af Amer: 60 mL/min/{1.73_m2} (ref >59)
Glucose: 215 mg/dL — ABNORMAL HIGH (ref 65–99)
Potassium: 4.9 mmol/L (ref 3.5–5.2)
Sodium: 138 mmol/L (ref 134–144)

## 2019-02-02 NOTE — Patient Instructions (Signed)
Medication Instructions:  Your physician recommends that you continue on your current medications as directed. Please refer to the Current Medication list given to you today.  If you need a refill on your cardiac medications before your next appointment, please call your pharmacy.   Lab work: None.  If you have labs (blood work) drawn today and your tests are completely normal, you will receive your results only by: . MyChart Message (if you have MyChart) OR . A paper copy in the mail If you have any lab test that is abnormal or we need to change your treatment, we will call you to review the results.  Testing/Procedures: None.   Follow-Up: At [facility], you and your health needs are our priority.  As part of our continuing mission to provide you with exceptional heart care, we have created designated Provider Care Teams.  These Care Teams include your primary Cardiologist (physician) and Advanced Practice Providers (APPs -  Physician Assistants and Nurse Practitioners) who all work together to provide you with the care you need, when you need it. You will need a follow up appointment in 3 months.  Please call our office 2 months in advance to schedule this appointment.  You may see No primary care provider on file. or another member of our [facility] Provider Team in [place]: Brian Munley, MD . Rajan Revankar, MD  Any Other Special Instructions Will Be Listed Below (If Applicable).     

## 2019-02-02 NOTE — Progress Notes (Signed)
Cardiology Office Note:    Date:  02/02/2019   ID:  Lawrence [name], DOB 07-Nov-1946, MRN [id number]  PCP:  [name], MD  Cardiologist:  [name], MD    Referring MD: [name], MD   Chief Complaint  Patient presents with  . 3 month follow up  Doing well  History of Present Illness:    Lawrence [name] is a 73 y.o. male with multiple medical problems comes today to my office for follow-up.  Earlier today he did see EP team the plan is to ablate his arrhythmia.  The biggest complaint he got is a problem with his knee that bothers him a lot.  He is actually in the wheelchair my office.  Past Medical History:  Diagnosis Date  . Anemia   . Chronic airway obstruction, not elsewhere classified   . Chronic ischemic heart disease, unspecified   . Coronary artery disease   . Diabetes mellitus    type II, neuropathy,   . Essential hypertension, benign   . Hyperlipidemia, mixed   . Nodule of kidney    incidentally found on CT abdomen 11/2012.  Pending Urology evaluation.   . Nontoxic multinodular goiter   . Obesity   . Other testicular hypofunction   . PUD (peptic ulcer disease)   . Renal insufficiency, mild    hx of  . Sleep apnea    uses cpap    Past Surgical History:  Procedure Laterality Date  . CHOLECYSTECTOMY    . COLONOSCOPY  05/11/2015   Colonic polyps statuts post polypectomy. Moderate predominalty sigmoid diverticulosis. Internal hemorrhoids. Tubular adenoma.   . CORONARY ARTERY BYPASS GRAFT  2007   x 4  . ESOPHAGOGASTRODUODENOSCOPY     10/06/2012:  hiatal hernia, moderate gastritis.  2012: Colonoscopy at [facility]:  one polyp.  Due next 2017  . EYE SURGERY    . KNEE DEBRIDEMENT    . LAPAROTOMY N/A 07/21/2018   Procedure: EXPLORATORY LAPAROTOMY WITH SMALL BOWEL RESECTION;  Surgeon: Erroll Luna, MD;  Location: [place];  Service: General;  Laterality: N/A;  . PACEMAKER IMPLANT N/A 08/22/2017   Procedure: Pacemaker Implant;  Surgeon: Constance Haw, MD;  Location: [place] [facility];  Service: Cardiovascular;  Laterality: N/A;  . SHOULDER SURGERY     left shoulder  . TONSILECTOMY, ADENOIDECTOMY, BILATERAL MYRINGOTOMY AND TUBES      Current Medications: Current Meds  Medication Sig  . apixaban (ELIQUIS) 5 MG TABS tablet Take 5 mg by mouth 2 (two) times daily.  . Cholecalciferol (VITAMIN D3) 2000 units TABS Take 2,000 Units by mouth daily.  . furosemide (LASIX) 40 MG tablet Take 40 mg by mouth daily.   . insulin glargine (LANTUS) 100 unit/mL SOPN Inject 3 Units into the skin daily as needed (for HBS > 200).   . Ipratropium-Albuterol (COMBIVENT RESPIMAT) 20-100 MCG/ACT AERS respimat Inhale 1 puff into the lungs daily as needed for wheezing or shortness of breath.   Marland Kitchen ipratropium-albuterol (DUONEB) 0.5-2.5 (3) MG/3ML SOLN Take 3 mLs by nebulization every 6 (six) hours as needed (for shortness of breath or wheezing).   Marland Kitchen linagliptin (TRADJENTA) 5 MG TABS tablet Take 5 mg by mouth daily as needed (for HBS >140).   . metolazone (ZAROXOLYN) 2.5 MG tablet Take 1 tablet (2.5 mg total) by mouth every Monday, Wednesday, and Friday.  . metoprolol tartrate (LOPRESSOR) 25 MG tablet Take 1 tablet (25 mg total) by mouth 2 (two) times daily.  Marland Kitchen  nitroGLYCERIN (NITROSTAT) 0.4 MG SL tablet Place 0.4 mg under the tongue every 5 (five) minutes as needed for chest pain.   Marland Kitchen oxyCODONE (OXY IR/ROXICODONE) 5 MG immediate release tablet Take 5 mg by mouth 2 (two) times daily as needed for severe pain.  . pantoprazole (PROTONIX) 40 MG tablet Take 40 mg by mouth daily before breakfast.   . simvastatin (ZOCOR) 20 MG tablet Take 20 mg by mouth at bedtime.      Allergies:   Ciprofloxacin; Sulfamethoxazole-trimethoprim; Lisinopril; and Naproxen   Social History   Socioeconomic History  . Marital status: Married    Spouse name: Not on file  . Number of children: 5  . Years of education: Not on file  . Highest education level: Not on file  Occupational  History  . Occupation: Retired    Comment: Medical illustrator; retired Chiropodist  Social Needs  . Financial resource strain: Not on file  . Food insecurity:    Worry: Not on file    Inability: Not on file  . Transportation needs:    Medical: Not on file    Non-medical: Not on file  Tobacco Use  . Smoking status: Former Smoker    Packs/day: 1.50    Years: 30.00    Pack years: 45.00    Types: Cigarettes    Last attempt to quit: 2006    Years since quitting: 14.1  . Smokeless tobacco: Former Systems developer    Types: Chew  Substance and Sexual Activity  . Alcohol use: Yes    Comment: rare  . Drug use: No  . Sexual activity: Not on file  Lifestyle  . Physical activity:    Days per week: Not on file    Minutes per session: Not on file  . Stress: Not on file  Relationships  . Social connections:    Talks on phone: Not on file    Gets together: Not on file    Attends religious service: Not on file    Active member of club or organization: Not on file    Attends meetings of clubs or organizations: Not on file    Relationship status: Not on file  Other Topics Concern  . Not on file  Social History Narrative   ** Merged History Encounter **       Lives with wife in a one story home.  Has 6 children.  Retired Nature conservation officer.  Education: college.     Family History: The patient's family history includes Asthma in his sister; Cancer in his father and mother; Colon cancer in his mother; Emphysema in an other family member; Esophageal cancer in his father; Hypertension in his mother; Kidney cancer in his mother; Stroke in his father. ROS:   Please see the history of present illness.    All 14 point review of systems negative except as described per history of present illness  EKGs/Labs/Other Studies Reviewed:      Recent Labs: 02/24/2018: B Natriuretic Peptide 370.5 05/16/2018: NT-Pro BNP 3,272 07/21/2018: ALT 33 07/31/2018: Magnesium 1.8 01/27/2019: BUN 46; Creatinine, Ser 1.76;  Hemoglobin 11.7; Platelets 349; Potassium 4.7; Sodium 136  Recent Lipid Panel No results found for: CHOL, TRIG, HDL, CHOLHDL, VLDL, LDLCALC, LDLDIRECT  Physical Exam:    VS:  BP 126/70   Pulse (!) 126   Ht 6' (1.829 m)   Wt 200 lb (90.7 kg)   SpO2 98%   BMI 27.12 kg/m     Wt Readings from Last 3 Encounters:  02/02/19  200 lb (90.7 kg)  02/02/19 200 lb (90.7 kg)  12/29/18 201 lb (91.2 kg)     GEN:  Well nourished, well developed in no acute distress HEENT: Normal NECK: No JVD; No carotid bruits LYMPHATICS: No lymphadenopathy CARDIAC: RRR tachycardic, no murmurs, no rubs, no gallops RESPIRATORY:  Clear to auscultation without rales, wheezing or rhonchi  ABDOMEN: Soft, non-tender, non-distended MUSCULOSKELETAL:  No edema; No deformity  SKIN: Warm and dry LOWER EXTREMITIES: no swelling NEUROLOGIC:  Alert and oriented x 3 PSYCHIATRIC:  Normal affect   ASSESSMENT:    1. CHF (congestive heart failure), NYHA class II, chronic, diastolic ([place])   2. Coronary artery disease involving native coronary artery of native heart without angina pectoris   3. Essential hypertension   4. Chronic diastolic heart failure (HCC)    PLAN:    In order of problems listed above:  1. Congestive heart failure.  Appears to be compensated he watch his weight very carefully he takes Zaroxolyn as well as Lasix.  He modified the dose depending on his weight which is appropriate. 2. Coronary artery disease stable we will continue present management. 3. Essential hypertension blood pressure well controlled. 4. Chronic diastolic congestive heart failure compensated.   Medication Adjustments/Labs and Tests Ordered: Current medicines are reviewed at length with the patient today.  Concerns regarding medicines are outlined above.  No orders of the defined types were placed in this encounter.  Medication changes: No orders of the defined types were placed in this encounter.   Signed, Park Liter, MD, [facility] 02/02/2019 11:31 AM    [place]

## 2019-02-02 NOTE — Patient Instructions (Signed)
Medication Instructions:  Your physician recommends that you continue on your current medications as directed. Please refer to the Current Medication list given to you today.  * If you need a refill on your cardiac medications before your next appointment, please call your pharmacy.   Labwork: Today: BMET *We will only notify you of abnormal results, otherwise continue current treatment plan.  Testing/Procedures: None ordered  Follow-Up: Your physician recommends that you schedule a follow-up appointment in: 3 months, after your ablation on 02/05/19, with Dr. Curt Bears.  Thank you for choosing [facility]!!   Trinidad Curet, RN [phone]  Any Other Special Instructions Will Be Listed Below (If Applicable).

## 2019-02-02 NOTE — Progress Notes (Signed)
Electrophysiology Office Note   Date:  02/02/2019   ID:  Lawrence [name], Lawrence [name] 30-Nov-1946, MRN [id number]  PCP:  Lawrence Sheriff, MD  Cardiologist:  Lawrence [name] Primary Electrophysiologist:  Lawrence Breece Meredith Leeds, MD    No chief complaint on file.    History of Present Illness: Lawrence [name] is a 73 y.o. male who is being seen today for the evaluation of sick sinus syndrome at the request of Lawrence Sheriff, MD. Presenting today for electrophysiology evaluation.  He has a history of coronary artery disease, paroxysmal atrial fibrillation, hypertension, hyperlipidemia, and chronic renal failure.  He presented to the hospital  in October 2018 and had a Lawrence [name] dual-chamber pacemaker implanted 08/22/17.  He is scheduled for atrial fibrillation ablation 02/05/2019  Today, denies symptoms of palpitations, chest pain, shortness of breath, orthopnea, PND, lower extremity edema, claudication, dizziness, presyncope, syncope, bleeding, or neurologic sequela. The patient is tolerating medications without difficulties.  Overall he is feeling well today.  He does have intermittent chest pain for 15 seconds of a dull type pain.  He is in a wheelchair today, but this is due to knee pain.  He otherwise is able to go to the gym and workout in the pool.  Previously been scheduled for CT scan, but his creatinine was elevated.  He has plans for repeat creatinine check today and CT scanning tomorrow.   Past Medical History:  Diagnosis Date  . Anemia   . Chronic airway obstruction, not elsewhere classified   . Chronic ischemic heart disease, unspecified   . Coronary artery disease   . Diabetes mellitus    type II, neuropathy,   . Essential hypertension, benign   . Hyperlipidemia, mixed   . Nodule of kidney    incidentally found on CT abdomen 11/2012.  Pending Urology evaluation.   . Nontoxic multinodular goiter   . Obesity   . Other testicular hypofunction   . PUD (peptic ulcer disease)   . Renal  insufficiency, mild    hx of  . Sleep apnea    uses cpap   Past Surgical History:  Procedure Laterality Date  . CHOLECYSTECTOMY    . COLONOSCOPY  05/11/2015   Colonic polyps statuts post polypectomy. Moderate predominalty sigmoid diverticulosis. Internal hemorrhoids. Tubular adenoma.   . CORONARY ARTERY BYPASS GRAFT  2007   x 4  . ESOPHAGOGASTRODUODENOSCOPY     10/06/2012:  hiatal hernia, moderate gastritis.  2012: Colonoscopy at [facility]:  one polyp.  Due next 2017  . EYE SURGERY    . KNEE DEBRIDEMENT    . LAPAROTOMY N/A 07/21/2018   Procedure: EXPLORATORY LAPAROTOMY WITH SMALL BOWEL RESECTION;  Surgeon: Lawrence Luna, MD;  Location: [place];  Service: General;  Laterality: N/A;  . PACEMAKER IMPLANT N/A 08/22/2017   Procedure: Pacemaker Implant;  Surgeon: Lawrence Haw, MD;  Location: [place] [facility];  Service: Cardiovascular;  Laterality: N/A;  . SHOULDER SURGERY     left shoulder  . TONSILECTOMY, ADENOIDECTOMY, BILATERAL MYRINGOTOMY AND TUBES       Current Outpatient Medications  Medication Sig Dispense Refill  . apixaban (ELIQUIS) 5 MG TABS tablet Take 5 mg by mouth 2 (two) times daily.    . Cholecalciferol (VITAMIN D3) 2000 units TABS Take 2,000 Units by mouth daily.    . insulin glargine (LANTUS) 100 unit/mL SOPN Inject 3 Units into the skin daily as needed (for HBS > 200).     . Ipratropium-Albuterol (COMBIVENT RESPIMAT) 20-100 MCG/ACT  AERS respimat Inhale 1 puff into the lungs daily as needed for wheezing or shortness of breath.     Marland Kitchen ipratropium-albuterol (DUONEB) 0.5-2.5 (3) MG/3ML SOLN Take 3 mLs by nebulization every 6 (six) hours as needed (for shortness of breath or wheezing).     Marland Kitchen linagliptin (TRADJENTA) 5 MG TABS tablet Take 5 mg by mouth daily as needed (for HBS >140).     . metoprolol tartrate (LOPRESSOR) 25 MG tablet Take 1 tablet (25 mg total) by mouth 2 (two) times daily. 60 tablet 3  . nitroGLYCERIN (NITROSTAT) 0.4 MG SL tablet Place 0.4 mg under the tongue  every 5 (five) minutes as needed for chest pain.     Marland Kitchen oxyCODONE (OXY IR/ROXICODONE) 5 MG immediate release tablet Take 5 mg by mouth 2 (two) times daily as needed for severe pain.    . pantoprazole (PROTONIX) 40 MG tablet Take 40 mg by mouth daily before breakfast.     . simvastatin (ZOCOR) 20 MG tablet Take 20 mg by mouth at bedtime.     . furosemide (LASIX) 40 MG tablet Take 40 mg by mouth daily.     . metolazone (ZAROXOLYN) 2.5 MG tablet Take 1 tablet (2.5 mg total) by mouth every Monday, Wednesday, and Friday.     No current facility-administered medications for this visit.     Allergies:   Ciprofloxacin; Sulfamethoxazole-trimethoprim; Lisinopril; and Naproxen   Social History:  The patient  reports that he quit smoking about 14 years ago. His smoking use included cigarettes. He has a 45.00 pack-year smoking history. He has quit using smokeless tobacco.  His smokeless tobacco use included chew. He reports current alcohol use. He reports that he does not use drugs.   Family History:  The patient's family history includes Asthma in his sister; Cancer in his father and mother; Colon cancer in his mother; Emphysema in an other family member; Esophageal cancer in his father; Hypertension in his mother; Kidney cancer in his mother; Stroke in his father.   ROS:  Please see the history of present illness.   Otherwise, review of systems is positive for knee pain.   All other systems are reviewed and negative.   PHYSICAL EXAM: VS:  BP 126/70   Pulse (!) 126   Ht 6' (1.829 m)   Wt 200 lb (90.7 kg)   BMI 27.12 kg/m  , BMI Body mass index is 27.12 kg/m. GEN: Well nourished, well developed, in no acute distress  HEENT: normal  Neck: no JVD, carotid bruits, or masses Cardiac: RRR; no murmurs, rubs, or gallops,no edema  Respiratory:  clear to auscultation bilaterally, normal work of breathing GI: soft, nontender, nondistended, + BS MS: no deformity or atrophy  Skin: warm and dry, device site  well healed Neuro:  Strength and sensation are intact Psych: euthymic mood, full affect  EKG:  EKG is ordered today. Personal review of the ekg ordered shows atrial flutter, rate 126  Personal review of the device interrogation today. Results in [place]: 02/24/2018: B Natriuretic Peptide 370.5 05/16/2018: NT-Pro BNP 3,272 07/21/2018: ALT 33 07/31/2018: Magnesium 1.8 01/27/2019: BUN 46; Creatinine, Ser 1.76; Hemoglobin 11.7; Platelets 349; Potassium 4.7; Sodium 136    Lipid Panel  No results found for: CHOL, TRIG, HDL, CHOLHDL, VLDL, LDLCALC, LDLDIRECT   Wt Readings from Last 3 Encounters:  02/02/19 200 lb (90.7 kg)  02/02/19 200 lb (90.7 kg)  12/29/18 201 lb (91.2 kg)      Other studies Reviewed:  Additional studies/ records that were reviewed today include: TTE 09/17/17  Review of the above records today demonstrates:  - Left ventricle: The cavity size was mildly dilated. Wall   thickness was normal. Systolic function was normal. The estimated   ejection fraction was in the range of 55% to 60%. Regional wall   motion abnormalities cannot be excluded. The study is not   technically sufficient to allow evaluation of LV diastolic   function. - Mitral valve: Calcified annulus. There was mild regurgitation. - Right ventricle: The cavity size was moderately dilated. - Tricuspid valve: There was moderate regurgitation. - Pulmonary arteries: PA peak pressure: 32 mm Hg (S).   ASSESSMENT AND PLAN:  1.  Sick sinus syndrome: This post [place] dual-chamber pacemaker.  Functioning appropriately.  No changes.    2.  Paroxysmal atrial fibrillation: Currently on Eliquis.  He has been taken off of amiodarone due to abnormal PFTs.  He is in atrial flutter today.  We Lewis Grivas plan for ablation.  We Marston Mccadden recheck his creatinine today as it was previously elevated and Esthefany Herrig likely plan for CT scanning tomorrow and prep for his procedure.  This patients CHA2DS2-VASc Score and unadjusted  Ischemic Stroke Rate (% per year) is equal to 4.8 % stroke rate/year from a score of 4  Above score calculated as 1 point each if present [CHF, HTN, DM, Vascular=MI/PAD/Aortic Plaque, Age if 65-74, or Male] Above score calculated as 2 points each if present [Age > 75, or Stroke/TIA/TE]   3.  Coronary artery disease: Mild chest pressure lasting up to 15 seconds.  Evaluation on CT scanning.  Briyan Kleven potentially improve with restoration of sinus rhythm  4.  Hypertension: Well-controlled today  5.  Hyperlipidemia: Continue statin  6.  Atrial tachycardia: Further noted.  We Amorette Charrette plan for check during ablation.   Current medicines are reviewed at length with the patient today.   The patient does not have concerns regarding his medicines.  The following changes were made today: None  Labs/ tests ordered today include:  Orders Placed This Encounter  Procedures  . Basic metabolic panel  . EKG 12-Lead    Disposition:   FU with Jourden Delmont 3 months  Signed, Alannis Hsia Meredith Leeds, MD  02/02/2019 11:14 AM     [facility] [address] [place] [address] [phone] (office) [phone] (fax)

## 2019-02-03 ENCOUNTER — Ambulatory Visit (HOSPITAL_COMMUNITY)
Admission: RE | Admit: 2019-02-03 | Discharge: 2019-02-03 | Disposition: A | Discharge status: Home / Self Care | Payer: Medicare Other | Source: Ambulatory Visit | Attending: Cardiology | Admitting: Cardiology

## 2019-02-03 ENCOUNTER — Ambulatory Visit (HOSPITAL_COMMUNITY): Payer: Medicare Other

## 2019-02-03 DIAGNOSIS — I480 Paroxysmal atrial fibrillation: Secondary | ICD-10-CM | POA: Insufficient documentation

## 2019-02-03 MED ORDER — IOPAMIDOL (ISOVUE-370) INJECTION 76%
80.0000 mL | Freq: Once | INTRAVENOUS | Status: AC | PRN
  Administered 2019-02-03: 80 mL via INTRAVENOUS

## 2019-02-04 NOTE — Anesthesia Preprocedure Evaluation (Addendum)
Anesthesia Evaluation  Patient identified by MRN, date of birth, ID band Patient awake    Reviewed: Allergy & Precautions, NPO status , Patient's Chart, lab work & pertinent test results  History of Anesthesia Complications Negative for: history of anesthetic complications  Airway Mallampati: II  TM Distance: >3 FB Neck ROM: Full    Dental  (+) Missing, Dental Advisory Given   Pulmonary sleep apnea , COPD,  COPD inhaler, former smoker,    Pulmonary exam normal        Cardiovascular hypertension, Pt. on medications and Pt. on home beta blockers (-) angina+ CAD, + CABG and +CHF  + dysrhythmias Atrial Fibrillation + pacemaker  Rhythm:Irregular Rate:Normal  Study Conclusions  - Left ventricle: The cavity size was normal. Wall thickness was   normal. Systolic function was normal. The estimated ejection   fraction was in the range of 60% to 65%. Wall motion was normal;   there were no regional wall motion abnormalities. - Aortic valve: Trileaflet; mildly thickened, mildly calcified   leaflets. - Mitral valve: There was trivial regurgitation. - Right ventricle: Pacer wire or catheter noted in right ventricle. - Tricuspid valve: There was moderate regurgitation. - Pulmonary arteries: Systolic pressure was moderately increased.   PA peak pressure: 51 mm Hg (S).   Neuro/Psych PSYCHIATRIC DISORDERS Anxiety negative neurological ROS     GI/Hepatic PUD, GERD  ,SBO   Endo/Other  diabetes, Type 2, Insulin DependentMorbid obesity  Renal/GU CRF and ARFRenal disease     Musculoskeletal   Abdominal   Peds  Hematology  (+) anemia ,   Anesthesia Other Findings St jude pacemaker  Reproductive/Obstetrics                            Anesthesia Physical  Anesthesia Plan  ASA: III and emergent  Anesthesia Plan: General   Post-op Pain Management:    Induction: Intravenous  PONV Risk Score and Plan:  2 and Ondansetron and Dexamethasone  Airway Management Planned: Oral ETT and LMA  Additional Equipment:   Intra-op Plan:   Post-operative Plan: Extubation in OR  Informed Consent: I have reviewed the patients History and Physical, chart, labs and discussed the procedure including the risks, benefits and alternatives for the proposed anesthesia with the patient or authorized representative who has indicated his/her understanding and acceptance.     Dental advisory given  Plan Discussed with: CRNA and Anesthesiologist  Anesthesia Plan Comments:        Anesthesia Quick Evaluation

## 2019-02-05 ENCOUNTER — Ambulatory Visit (HOSPITAL_COMMUNITY)
Admission: RE | Admit: 2019-02-05 | Discharge: 2019-02-05 | Disposition: A | Discharge status: Home / Self Care | Payer: Medicare Other | Attending: Cardiology | Admitting: Cardiology

## 2019-02-05 ENCOUNTER — Encounter (HOSPITAL_COMMUNITY)
Admission: RE | Disposition: A | Discharge status: Home / Self Care | Payer: Self-pay | Source: Home / Self Care | Attending: Cardiology

## 2019-02-05 ENCOUNTER — Other Ambulatory Visit: Payer: Self-pay

## 2019-02-05 ENCOUNTER — Ambulatory Visit (HOSPITAL_COMMUNITY): Payer: Medicare Other | Admitting: Certified Registered Nurse Anesthetist

## 2019-02-05 DIAGNOSIS — H903 Sensorineural hearing loss, bilateral: Secondary | ICD-10-CM | POA: Diagnosis not present

## 2019-02-05 DIAGNOSIS — I130 Hypertensive heart and chronic kidney disease with heart failure and stage 1 through stage 4 chronic kidney disease, or unspecified chronic kidney disease: Secondary | ICD-10-CM | POA: Diagnosis not present

## 2019-02-05 DIAGNOSIS — E1122 Type 2 diabetes mellitus with diabetic chronic kidney disease: Secondary | ICD-10-CM | POA: Diagnosis not present

## 2019-02-05 DIAGNOSIS — I4819 Other persistent atrial fibrillation: Secondary | ICD-10-CM | POA: Insufficient documentation

## 2019-02-05 DIAGNOSIS — Z794 Long term (current) use of insulin: Secondary | ICD-10-CM | POA: Insufficient documentation

## 2019-02-05 DIAGNOSIS — E669 Obesity, unspecified: Secondary | ICD-10-CM | POA: Insufficient documentation

## 2019-02-05 DIAGNOSIS — Z888 Allergy status to other drugs, medicaments and biological substances status: Secondary | ICD-10-CM | POA: Diagnosis not present

## 2019-02-05 DIAGNOSIS — Z881 Allergy status to other antibiotic agents status: Secondary | ICD-10-CM | POA: Diagnosis not present

## 2019-02-05 DIAGNOSIS — I2510 Atherosclerotic heart disease of native coronary artery without angina pectoris: Secondary | ICD-10-CM | POA: Diagnosis not present

## 2019-02-05 DIAGNOSIS — G4730 Sleep apnea, unspecified: Secondary | ICD-10-CM | POA: Diagnosis not present

## 2019-02-05 DIAGNOSIS — Z951 Presence of aortocoronary bypass graft: Secondary | ICD-10-CM | POA: Insufficient documentation

## 2019-02-05 DIAGNOSIS — E782 Mixed hyperlipidemia: Secondary | ICD-10-CM | POA: Insufficient documentation

## 2019-02-05 DIAGNOSIS — I5042 Chronic combined systolic (congestive) and diastolic (congestive) heart failure: Secondary | ICD-10-CM | POA: Insufficient documentation

## 2019-02-05 DIAGNOSIS — K219 Gastro-esophageal reflux disease without esophagitis: Secondary | ICD-10-CM | POA: Insufficient documentation

## 2019-02-05 DIAGNOSIS — Z882 Allergy status to sulfonamides status: Secondary | ICD-10-CM | POA: Insufficient documentation

## 2019-02-05 DIAGNOSIS — N183 Chronic kidney disease, stage 3 (moderate): Secondary | ICD-10-CM | POA: Diagnosis not present

## 2019-02-05 DIAGNOSIS — I509 Heart failure, unspecified: Secondary | ICD-10-CM | POA: Diagnosis not present

## 2019-02-05 DIAGNOSIS — N189 Chronic kidney disease, unspecified: Secondary | ICD-10-CM | POA: Diagnosis not present

## 2019-02-05 DIAGNOSIS — Z79899 Other long term (current) drug therapy: Secondary | ICD-10-CM | POA: Diagnosis not present

## 2019-02-05 DIAGNOSIS — J449 Chronic obstructive pulmonary disease, unspecified: Secondary | ICD-10-CM | POA: Diagnosis not present

## 2019-02-05 DIAGNOSIS — I4891 Unspecified atrial fibrillation: Secondary | ICD-10-CM | POA: Diagnosis not present

## 2019-02-05 DIAGNOSIS — I483 Typical atrial flutter: Secondary | ICD-10-CM | POA: Diagnosis not present

## 2019-02-05 DIAGNOSIS — E042 Nontoxic multinodular goiter: Secondary | ICD-10-CM | POA: Diagnosis not present

## 2019-02-05 DIAGNOSIS — Z87891 Personal history of nicotine dependence: Secondary | ICD-10-CM | POA: Insufficient documentation

## 2019-02-05 DIAGNOSIS — I4892 Unspecified atrial flutter: Secondary | ICD-10-CM | POA: Diagnosis not present

## 2019-02-05 DIAGNOSIS — Z950 Presence of cardiac pacemaker: Secondary | ICD-10-CM | POA: Insufficient documentation

## 2019-02-05 DIAGNOSIS — Z7901 Long term (current) use of anticoagulants: Secondary | ICD-10-CM | POA: Insufficient documentation

## 2019-02-05 HISTORY — PX: ATRIAL FIBRILLATION ABLATION: EP1191

## 2019-02-05 LAB — POCT ACTIVATED CLOTTING TIME
Activated Clotting Time: 301 seconds
Activated Clotting Time: 263 seconds
Activated Clotting Time: 279 seconds
Activated Clotting Time: 307 seconds
Activated Clotting Time: 180 seconds

## 2019-02-05 LAB — GLUCOSE, CAPILLARY
Glucose-Capillary: 103 mg/dL — ABNORMAL HIGH (ref 70–99)
Glucose-Capillary: 132 mg/dL — ABNORMAL HIGH (ref 70–99)

## 2019-02-05 SURGERY — ATRIAL FIBRILLATION ABLATION
Anesthesia: General

## 2019-02-05 MED ORDER — SODIUM CHLORIDE 0.9% FLUSH: 3.0000 mL | INTRAVENOUS | Status: DC | PRN

## 2019-02-05 MED ORDER — HEPARIN (PORCINE) IN NACL 1000-0.9 UT/500ML-% IV SOLN
INTRAVENOUS | Status: AC
  Filled 2019-02-05: qty 500

## 2019-02-05 MED ORDER — PHENYLEPHRINE 40 MCG/ML (10ML) SYRINGE FOR IV PUSH (FOR BLOOD PRESSURE SUPPORT)
PREFILLED_SYRINGE | INTRAVENOUS | Status: DC | PRN
  Administered 2019-02-05: 80 ug via INTRAVENOUS

## 2019-02-05 MED ORDER — ROCURONIUM BROMIDE 50 MG/5ML IV SOSY
PREFILLED_SYRINGE | INTRAVENOUS | Status: DC | PRN
  Administered 2019-02-05: 50 mg via INTRAVENOUS

## 2019-02-05 MED ORDER — METOPROLOL TARTRATE 25 MG PO TABS
25.0000 mg | ORAL_TABLET | Freq: Once | ORAL | Status: AC
  Administered 2019-02-05: 25 mg via ORAL
  Filled 2019-02-05: qty 1

## 2019-02-05 MED ORDER — BUPIVACAINE HCL (PF) 0.25 % IJ SOLN
INTRAMUSCULAR | Status: DC | PRN
  Administered 2019-02-05: 30 mL

## 2019-02-05 MED ORDER — SUGAMMADEX SODIUM 200 MG/2ML IV SOLN
INTRAVENOUS | Status: DC | PRN
  Administered 2019-02-05: 177 mg via INTRAVENOUS

## 2019-02-05 MED ORDER — HEPARIN SODIUM (PORCINE) 1000 UNIT/ML IJ SOLN
INTRAMUSCULAR | Status: DC | PRN
  Administered 2019-02-05: 1000 [IU] via INTRAVENOUS

## 2019-02-05 MED ORDER — ACETAMINOPHEN 500 MG PO TABS
1000.0000 mg | ORAL_TABLET | Freq: Once | ORAL | Status: AC
  Administered 2019-02-05: 1000 mg via ORAL
  Filled 2019-02-05 (×2): qty 2

## 2019-02-05 MED ORDER — METOPROLOL TARTRATE 50 MG PO TABS: 50.0000 mg | ORAL_TABLET | Freq: Two times a day (BID) | ORAL | 6 refills | Status: DC

## 2019-02-05 MED ORDER — ACETAMINOPHEN 325 MG PO TABS
650.0000 mg | ORAL_TABLET | ORAL | Status: DC | PRN
  Administered 2019-02-05: 650 mg via ORAL
  Filled 2019-02-05: qty 2

## 2019-02-05 MED ORDER — PROPOFOL 10 MG/ML IV BOLUS
INTRAVENOUS | Status: DC | PRN
  Administered 2019-02-05: 170 mg via INTRAVENOUS

## 2019-02-05 MED ORDER — DEXAMETHASONE SODIUM PHOSPHATE 10 MG/ML IJ SOLN
INTRAMUSCULAR | Status: DC | PRN
  Administered 2019-02-05: 4 mg via INTRAVENOUS

## 2019-02-05 MED ORDER — HEPARIN SODIUM (PORCINE) 1000 UNIT/ML IJ SOLN
INTRAMUSCULAR | Status: AC
  Filled 2019-02-05: qty 1

## 2019-02-05 MED ORDER — SODIUM CHLORIDE 0.9 % IV SOLN
INTRAVENOUS | Status: DC
  Administered 2019-02-05: 06:00:00 via INTRAVENOUS

## 2019-02-05 MED ORDER — CEFAZOLIN SODIUM-DEXTROSE 2-3 GM-%(50ML) IV SOLR
INTRAVENOUS | Status: DC | PRN
  Administered 2019-02-05: 2 g via INTRAVENOUS

## 2019-02-05 MED ORDER — DOBUTAMINE IN D5W 4-5 MG/ML-% IV SOLN
INTRAVENOUS | Status: DC | PRN
  Administered 2019-02-05: 20 ug/kg/min via INTRAVENOUS

## 2019-02-05 MED ORDER — PROTAMINE SULFATE 10 MG/ML IV SOLN
INTRAVENOUS | Status: DC | PRN
  Administered 2019-02-05 (×5): 10 mg via INTRAVENOUS

## 2019-02-05 MED ORDER — FENTANYL CITRATE (PF) 250 MCG/5ML IJ SOLN
INTRAMUSCULAR | Status: DC | PRN
  Administered 2019-02-05: 100 ug via INTRAVENOUS

## 2019-02-05 MED ORDER — BUPIVACAINE HCL (PF) 0.25 % IJ SOLN
INTRAMUSCULAR | Status: AC
  Filled 2019-02-05: qty 30

## 2019-02-05 MED ORDER — ONDANSETRON HCL 4 MG/2ML IJ SOLN
INTRAMUSCULAR | Status: DC | PRN
  Administered 2019-02-05: 4 mg via INTRAVENOUS

## 2019-02-05 MED ORDER — DOBUTAMINE IN D5W 4-5 MG/ML-% IV SOLN
INTRAVENOUS | Status: AC
  Filled 2019-02-05: qty 250

## 2019-02-05 MED ORDER — HEPARIN SODIUM (PORCINE) 1000 UNIT/ML IJ SOLN
INTRAMUSCULAR | Status: DC | PRN
  Administered 2019-02-05: 15000 [IU] via INTRAVENOUS
  Administered 2019-02-05: 6000 [IU] via INTRAVENOUS
  Administered 2019-02-05: 3000 [IU] via INTRAVENOUS

## 2019-02-05 MED ORDER — SODIUM CHLORIDE 0.9 % IV SOLN: 250.0000 mL | INTRAVENOUS | Status: DC | PRN

## 2019-02-05 MED ORDER — SODIUM CHLORIDE 0.9% FLUSH: 3.0000 mL | Freq: Two times a day (BID) | INTRAVENOUS | Status: DC

## 2019-02-05 MED ORDER — ONDANSETRON HCL 4 MG/2ML IJ SOLN: 4.0000 mg | Freq: Four times a day (QID) | INTRAMUSCULAR | Status: DC | PRN

## 2019-02-05 MED ORDER — LIDOCAINE 2% (20 MG/ML) 5 ML SYRINGE
INTRAMUSCULAR | Status: DC | PRN
  Administered 2019-02-05: 100 mg via INTRAVENOUS

## 2019-02-05 MED ORDER — CEFAZOLIN SODIUM-DEXTROSE 2-4 GM/100ML-% IV SOLN
INTRAVENOUS | Status: AC
  Filled 2019-02-05: qty 100

## 2019-02-05 MED ORDER — HEPARIN (PORCINE) IN NACL 1000-0.9 UT/500ML-% IV SOLN
INTRAVENOUS | Status: DC | PRN
  Administered 2019-02-05 (×5): 500 mL

## 2019-02-05 MED ORDER — SODIUM CHLORIDE 0.9 % IV SOLN
INTRAVENOUS | Status: DC | PRN
  Administered 2019-02-05: 25 ug/min via INTRAVENOUS

## 2019-02-05 SURGICAL SUPPLY — 23 items
BAG SNAP BAND KOVER 36X36 (MISCELLANEOUS) ×2 IMPLANT
BLANKET WARM UNDERBOD FULL ACC (MISCELLANEOUS) ×2 IMPLANT
CATH MAPPNG PENTARAY F 2-6-2MM (CATHETERS) IMPLANT
CATH SMTCH THERMOCOOL SF DF (CATHETERS) ×2 IMPLANT
CATH SOUNDSTAR ECO REPROCESSED (CATHETERS) ×2 IMPLANT
CATH WEBSTER BI DIR CS D-F CRV (CATHETERS) ×2 IMPLANT
COVER SWIFTLINK CONNECTOR (BAG) ×2 IMPLANT
PACK EP LATEX FREE (CUSTOM PROCEDURE TRAY) ×3
PACK EP LF (CUSTOM PROCEDURE TRAY) ×1 IMPLANT
PAD PRO RADIOLUCENT 2001M-C (PAD) ×3 IMPLANT
PATCH CARTO3 (PAD) ×2 IMPLANT
PENTARAY F 2-6-2MM (CATHETERS) ×3
SHEATH AVANTI 11F 11CM (SHEATH) ×2 IMPLANT
SHEATH BAYLIS SUREFLEX  S 8.5 (SHEATH) ×2
SHEATH BAYLIS SUREFLEX S 8.5 (SHEATH) IMPLANT
SHEATH BAYLIS TRANSSEPTAL 98CM (NEEDLE) ×2 IMPLANT
SHEATH CARTO VIZIGO SM CVD (SHEATH) ×2 IMPLANT
SHEATH PINNACLE 7F 10CM (SHEATH) ×2 IMPLANT
SHEATH PINNACLE 8F 10CM (SHEATH) ×4 IMPLANT
SHEATH PINNACLE 9F 10CM (SHEATH) ×4 IMPLANT
TUBING SMART ABLATE COOLFLOW (TUBING) ×2 IMPLANT
WIRE GUIDERIGHT .032X180 (WIRE) ×2 IMPLANT
WIRE HI TORQ VERSACORE-J 145CM (WIRE) IMPLANT

## 2019-02-05 NOTE — H&P (Signed)
Lawrence [name] has presented today for surgery, with the diagnosis of atrial fibrillation.  The various methods of treatment have been discussed with the patient and family. After consideration of risks, benefits and other options for treatment, the patient has consented to  Procedure(s): Catheter ablation as a surgical intervention .  Risks include but not limited to bleeding, tamponade, heart block, stroke, damage to surrounding organs, among others. The patient's history has been reviewed, patient examined, no change in status, stable for surgery.  I have reviewed the patient's chart and labs.  Questions were answered to the patient's satisfaction.    [name] Curt Bears, MD 02/05/2019 7:06 AM

## 2019-02-05 NOTE — Progress Notes (Signed)
Site area: rt groin fv sheaths x2 Site Prior to Removal:  Level 0 Pressure Applied For: 15 minutes Manual:   yes Patient Status During Pull:  stable Post Pull Site:  Level 0 Post Pull Instructions Given:  yes Post Pull Pulses Present: rt pt palpable Dressing Applied:  Gauze and tegaderm Bedrest begins @  Comments:   

## 2019-02-05 NOTE — Anesthesia Postprocedure Evaluation (Signed)
Anesthesia Post Note  Patient: Lawrence [name]  Procedure(s) Performed: ATRIAL FIBRILLATION ABLATION (N/A )     Patient location during evaluation: PACU Anesthesia Type: General Level of consciousness: sedated Pain management: pain level controlled Vital Signs Assessment: post-procedure vital signs reviewed and stable Respiratory status: spontaneous breathing and respiratory function stable Cardiovascular status: stable Postop Assessment: no apparent nausea or vomiting Anesthetic complications: no    Last Vitals:  Vitals:   02/05/19 1145 02/05/19 1150  BP: (!) 122/51 (!) 123/91  Pulse: 60 68  Resp: 13 (!) 21  Temp:    SpO2: 95% 98%    Last Pain:  Vitals:   02/05/19 1244  TempSrc:   PainSc: 3                  Briunna Leicht DANIEL

## 2019-02-05 NOTE — Anesthesia Procedure Notes (Signed)
Procedure Name: Intubation Date/Time: 02/05/2019 7:40 AM Performed by: Renato Shin, CRNA Pre-anesthesia Checklist: Patient identified, Emergency Drugs available, Suction available and Patient being monitored Patient Re-evaluated:Patient Re-evaluated prior to induction Oxygen Delivery Method: Circle system utilized Preoxygenation: Pre-oxygenation with 100% oxygen Induction Type: IV induction Ventilation: Mask ventilation without difficulty and Oral airway inserted - appropriate to patient size Laryngoscope Size: Miller and 3 Grade View: Grade I Tube type: Oral Tube size: 7.5 mm Number of attempts: 1 Airway Equipment and Method: Stylet Placement Confirmation: ETT inserted through vocal cords under direct vision and breath sounds checked- equal and bilateral Secured at: 21 cm Tube secured with: Tape Dental Injury: Teeth and Oropharynx as per pre-operative assessment

## 2019-02-05 NOTE — Progress Notes (Signed)
Site area:  Left groin fv sheaths x2 Site Prior to Removal:  Level 0 Pressure Applied For: 20 minutes Manual:   yes Patient Status During Pull:  stable Post Pull Site:  Level  0 Post Pull Instructions Given:  yes Post Pull Pulses Present: left pt palpable Dressing Applied:  Gauze and tegaderm Bedrest begins @ 1150 Comments:  IV saline locked

## 2019-02-05 NOTE — Discharge Instructions (Signed)
Post procedure care instructions No driving for 4 days. No lifting over 5 lbs for 1 week. No vigorous or sexual activity for 1 week. You may return to work on 02/12/19. Keep procedure site clean & dry. If you notice increased pain, swelling, bleeding or pus, call/return!  You may shower, but no soaking baths/hot tubs/pools for 1 week.       Cardiac Ablation, Care After This sheet gives you information about how to care for yourself after your procedure. Your health care provider may also give you more specific instructions. If you have problems or questions, contact your health care provider. What can I expect after the procedure? After the procedure, it is common to have:  Bruising around your puncture site.  Tenderness around your puncture site.  Skipped heartbeats.  Tiredness (fatigue). Follow these instructions at home: Puncture site care   Follow instructions from your health care provider about how to take care of your puncture site. Make sure you: ? Wash your hands with soap and water before you change your bandage (dressing). If soap and water are not available, use hand sanitizer. ? Change your dressing as told by your health care provider. ? Leave stitches (sutures), skin glue, or adhesive strips in place. These skin closures may need to stay in place for up to 2 weeks. If adhesive strip edges start to loosen and curl up, you may trim the loose edges. Do not remove adhesive strips completely unless your health care provider tells you to do that.  Check your puncture site every day for signs of infection. Check for: ? Redness, swelling, or pain. ? Fluid or blood. If your puncture site starts to bleed, lie down on your back, apply firm pressure to the area, and contact your health care provider. ? Warmth. ? Pus or a bad smell. Driving  Ask your health care provider when it is safe for you to drive again after the procedure.  Do not drive or use heavy machinery while taking  prescription pain medicine.  Do not drive for 24 hours if you were given a medicine to help you relax (sedative) during your procedure. Activity  Avoid activities that take a lot of effort for at least 3 days after your procedure.  Do not lift anything that is heavier than 10 lb (4.5 kg), or the limit that you are told, until your health care provider says that it is safe.  Return to your normal activities as told by your health care provider. Ask your health care provider what activities are safe for you. General instructions  Take over-the-counter and prescription medicines only as told by your health care provider.  Do not use any products that contain nicotine or tobacco, such as cigarettes and e-cigarettes. If you need help quitting, ask your health care provider.  Do not take baths, swim, or use a hot tub until your health care provider approves.  Do not drink alcohol for 24 hours after your procedure.  Keep all follow-up visits as told by your health care provider. This is important. Contact a health care provider if:  You have redness, mild swelling, or pain around your puncture site.  You have fluid or blood coming from your puncture site that stops after applying firm pressure to the area.  Your puncture site feels warm to the touch.  You have pus or a bad smell coming from your puncture site.  You have a fever.  You have chest pain or discomfort that spreads to  your neck, jaw, or arm.  You are sweating a lot.  You feel nauseous.  You have a fast or irregular heartbeat.  You have shortness of breath.  You are dizzy or light-headed and feel the need to lie down.  You have pain or numbness in the arm or leg closest to your puncture site. Get help right away if:  Your puncture site suddenly swells.  Your puncture site is bleeding and the bleeding does not stop after applying firm pressure to the area. These symptoms may represent a serious problem that is an  emergency. Do not wait to see if the symptoms will go away. Get medical help right away. Call your local emergency services (911 in the [place] Do not drive yourself to the hospital. Summary  After the procedure, it is normal to have bruising and tenderness at the puncture site in your groin, neck, or forearm.  Check your puncture site every day for signs of infection.  Get help right away if your puncture site is bleeding and the bleeding does not stop after applying firm pressure to the area. This is a medical emergency. This information is not intended to replace advice given to you by your health care provider. Make sure you discuss any questions you have with your health care provider. Document Released: 03/21/2017 Document Revised: 03/21/2017 Document Reviewed: 03/21/2017 Elsevier Interactive Patient Education  2019 [place] have an appointment set up with the [facility].  Multiple studies have shown that being followed by a dedicated atrial fibrillation clinic in addition to the standard care you receive from your other physicians improves health. We believe that enrollment in the atrial fibrillation clinic will allow [place] to better care for you.   The phone number to the [facility] is [phone]. The clinic is staffed Monday through Friday from 8:30am to 5pm.  Parking Directions: The clinic is located in the Heart and Vascular Building connected to [facility]. 1)From [address] turn on to [address] and go to the 3rd entrance  (Heart and Vascular entrance) on the right. 2)Look to the right for Heart &Vascular Parking Garage. 3)A code for the entrance is required please call the clinic to receive this.   4)Take the elevators to the 1st floor. Registration is in the room with the glass walls at the end of the hallway.  If you have any trouble parking or locating the clinic, please dont hesitate to call [phone].

## 2019-02-05 NOTE — Progress Notes (Signed)
Central tele called w HR 129, pt states he felt a flutter feeling but denies sob, groin sites level 1 no chest pain. Marinus Maw pa was called, ordered ekg, will wait for further instruction.

## 2019-02-05 NOTE — Transfer of Care (Signed)
Immediate Anesthesia Transfer of Care Note  Patient: Lawrence [name]  Procedure(s) Performed: ATRIAL FIBRILLATION ABLATION (N/A )  Patient Location: PACU  Anesthesia Type:General  Level of Consciousness: awake, oriented and patient cooperative  Airway & Oxygen Therapy: Patient Spontanous Breathing and Patient connected to nasal cannula oxygen  Post-op Assessment: Report given to RN and Post -op Vital signs reviewed and stable  Post vital signs: Reviewed and stable  Last Vitals:  Vitals Value Taken Time  BP 105/49 02/05/2019 10:45 AM  Temp 36.5 C 02/05/2019 10:47 AM  Pulse 68 02/05/2019 10:47 AM  Resp 11 02/05/2019 10:47 AM  SpO2 96 % 02/05/2019 10:47 AM  Vitals shown include unvalidated device data.  Last Pain:  Vitals:   02/05/19 1047  TempSrc: Tympanic         Complications: No apparent anesthesia complications

## 2019-02-06 ENCOUNTER — Encounter (HOSPITAL_COMMUNITY): Payer: Self-pay | Admitting: Cardiology

## 2019-02-25 ENCOUNTER — Ambulatory Visit (INDEPENDENT_AMBULATORY_CARE_PROVIDER_SITE_OTHER): Payer: Medicare Other | Admitting: *Deleted

## 2019-02-25 DIAGNOSIS — I480 Paroxysmal atrial fibrillation: Secondary | ICD-10-CM

## 2019-02-25 DIAGNOSIS — I495 Sick sinus syndrome: Secondary | ICD-10-CM

## 2019-02-25 LAB — CUP PACEART REMOTE DEVICE CHECK
Battery Remaining Longevity: 116 mo
Battery Remaining Percentage: 95.5 %
Battery Voltage: 3.01 V
Brady Statistic AP VP Percent: 1.2 %
Brady Statistic AP VS Percent: 52 %
Brady Statistic AS VS Percent: 44 %
Brady Statistic RA Percent Paced: 8.5 %
Brady Statistic RV Percent Paced: 17 %
Date Time Interrogation Session: 20200304070016
Implantable Lead Implant Date: 20180830
Implantable Lead Implant Date: 20180830
Implantable Lead Location: 753859
Implantable Pulse Generator Implant Date: 20180830
Lead Channel Impedance Value: 410 Ohm
Lead Channel Impedance Value: 430 Ohm
Lead Channel Pacing Threshold Amplitude: 0.625 V
Lead Channel Pacing Threshold Amplitude: 0.75 V
Lead Channel Pacing Threshold Pulse Width: 0.5 ms
Lead Channel Pacing Threshold Pulse Width: 0.5 ms
Lead Channel Sensing Intrinsic Amplitude: 2.3 mV
Lead Channel Sensing Intrinsic Amplitude: 7.8 mV
Lead Channel Setting Pacing Amplitude: 2.5 V
Lead Channel Setting Pacing Pulse Width: 0.5 ms
Lead Channel Setting Sensing Sensitivity: 2 mV
MDC IDC LEAD LOCATION: 753860
MDC IDC SET LEADCHNL RA PACING AMPLITUDE: 1.625
MDC IDC STAT BRADY AS VP PERCENT: 1 %
Pulse Gen Model: 2272
Pulse Gen Serial Number: 8939815

## 2019-02-26 DIAGNOSIS — E1165 Type 2 diabetes mellitus with hyperglycemia: Secondary | ICD-10-CM | POA: Diagnosis not present

## 2019-02-26 DIAGNOSIS — I10 Essential (primary) hypertension: Secondary | ICD-10-CM | POA: Diagnosis not present

## 2019-02-26 DIAGNOSIS — E7439 Other disorders of intestinal carbohydrate absorption: Secondary | ICD-10-CM | POA: Diagnosis not present

## 2019-02-26 DIAGNOSIS — Z79899 Other long term (current) drug therapy: Secondary | ICD-10-CM | POA: Diagnosis not present

## 2019-02-26 DIAGNOSIS — N189 Chronic kidney disease, unspecified: Secondary | ICD-10-CM | POA: Diagnosis not present

## 2019-02-26 DIAGNOSIS — M7030 Other bursitis of elbow, unspecified elbow: Secondary | ICD-10-CM | POA: Diagnosis not present

## 2019-02-27 DIAGNOSIS — M7021 Olecranon bursitis, right elbow: Secondary | ICD-10-CM | POA: Diagnosis not present

## 2019-03-03 DIAGNOSIS — M7021 Olecranon bursitis, right elbow: Secondary | ICD-10-CM | POA: Diagnosis not present

## 2019-03-04 NOTE — Progress Notes (Signed)
Remote pacemaker transmission.   

## 2019-03-05 DIAGNOSIS — K922 Gastrointestinal hemorrhage, unspecified: Secondary | ICD-10-CM | POA: Diagnosis not present

## 2019-03-05 DIAGNOSIS — K559 Vascular disorder of intestine, unspecified: Secondary | ICD-10-CM | POA: Diagnosis not present

## 2019-03-05 DIAGNOSIS — D500 Iron deficiency anemia secondary to blood loss (chronic): Secondary | ICD-10-CM | POA: Diagnosis not present

## 2019-03-09 ENCOUNTER — Ambulatory Visit (HOSPITAL_COMMUNITY): Payer: Medicare Other | Admitting: Nurse Practitioner

## 2019-03-16 DIAGNOSIS — M542 Cervicalgia: Secondary | ICD-10-CM | POA: Diagnosis not present

## 2019-03-16 DIAGNOSIS — E785 Hyperlipidemia, unspecified: Secondary | ICD-10-CM | POA: Diagnosis present

## 2019-03-16 DIAGNOSIS — N289 Disorder of kidney and ureter, unspecified: Secondary | ICD-10-CM | POA: Diagnosis not present

## 2019-03-16 DIAGNOSIS — G4733 Obstructive sleep apnea (adult) (pediatric): Secondary | ICD-10-CM | POA: Diagnosis present

## 2019-03-16 DIAGNOSIS — D72829 Elevated white blood cell count, unspecified: Secondary | ICD-10-CM | POA: Diagnosis not present

## 2019-03-16 DIAGNOSIS — Z794 Long term (current) use of insulin: Secondary | ICD-10-CM | POA: Diagnosis not present

## 2019-03-16 DIAGNOSIS — M1990 Unspecified osteoarthritis, unspecified site: Secondary | ICD-10-CM | POA: Diagnosis present

## 2019-03-16 DIAGNOSIS — G9341 Metabolic encephalopathy: Secondary | ICD-10-CM | POA: Diagnosis present

## 2019-03-16 DIAGNOSIS — R4182 Altered mental status, unspecified: Secondary | ICD-10-CM | POA: Diagnosis not present

## 2019-03-16 DIAGNOSIS — E871 Hypo-osmolality and hyponatremia: Secondary | ICD-10-CM | POA: Diagnosis present

## 2019-03-16 DIAGNOSIS — J449 Chronic obstructive pulmonary disease, unspecified: Secondary | ICD-10-CM | POA: Diagnosis not present

## 2019-03-16 DIAGNOSIS — M25522 Pain in left elbow: Secondary | ICD-10-CM | POA: Diagnosis not present

## 2019-03-16 DIAGNOSIS — I4892 Unspecified atrial flutter: Secondary | ICD-10-CM | POA: Diagnosis present

## 2019-03-16 DIAGNOSIS — I509 Heart failure, unspecified: Secondary | ICD-10-CM | POA: Diagnosis not present

## 2019-03-16 DIAGNOSIS — R5381 Other malaise: Secondary | ICD-10-CM | POA: Diagnosis not present

## 2019-03-16 DIAGNOSIS — N179 Acute kidney failure, unspecified: Secondary | ICD-10-CM | POA: Diagnosis present

## 2019-03-16 DIAGNOSIS — E860 Dehydration: Secondary | ICD-10-CM | POA: Diagnosis present

## 2019-03-16 DIAGNOSIS — I5032 Chronic diastolic (congestive) heart failure: Secondary | ICD-10-CM | POA: Diagnosis present

## 2019-03-16 DIAGNOSIS — Z79899 Other long term (current) drug therapy: Secondary | ICD-10-CM | POA: Diagnosis not present

## 2019-03-16 DIAGNOSIS — N183 Chronic kidney disease, stage 3 (moderate): Secondary | ICD-10-CM | POA: Diagnosis present

## 2019-03-16 DIAGNOSIS — M7022 Olecranon bursitis, left elbow: Secondary | ICD-10-CM | POA: Diagnosis present

## 2019-03-16 DIAGNOSIS — Z950 Presence of cardiac pacemaker: Secondary | ICD-10-CM | POA: Diagnosis not present

## 2019-03-16 DIAGNOSIS — Z881 Allergy status to other antibiotic agents status: Secondary | ICD-10-CM | POA: Diagnosis not present

## 2019-03-16 DIAGNOSIS — A419 Sepsis, unspecified organism: Secondary | ICD-10-CM | POA: Diagnosis not present

## 2019-03-16 DIAGNOSIS — D509 Iron deficiency anemia, unspecified: Secondary | ICD-10-CM | POA: Diagnosis present

## 2019-03-16 DIAGNOSIS — I2510 Atherosclerotic heart disease of native coronary artery without angina pectoris: Secondary | ICD-10-CM | POA: Diagnosis present

## 2019-03-16 DIAGNOSIS — N419 Inflammatory disease of prostate, unspecified: Secondary | ICD-10-CM | POA: Diagnosis present

## 2019-03-16 DIAGNOSIS — R9431 Abnormal electrocardiogram [ECG] [EKG]: Secondary | ICD-10-CM | POA: Diagnosis not present

## 2019-03-16 DIAGNOSIS — I272 Pulmonary hypertension, unspecified: Secondary | ICD-10-CM | POA: Diagnosis present

## 2019-03-16 DIAGNOSIS — Z951 Presence of aortocoronary bypass graft: Secondary | ICD-10-CM | POA: Diagnosis not present

## 2019-03-16 DIAGNOSIS — N390 Urinary tract infection, site not specified: Secondary | ICD-10-CM | POA: Diagnosis present

## 2019-03-16 DIAGNOSIS — R945 Abnormal results of liver function studies: Secondary | ICD-10-CM | POA: Diagnosis not present

## 2019-03-16 DIAGNOSIS — I130 Hypertensive heart and chronic kidney disease with heart failure and stage 1 through stage 4 chronic kidney disease, or unspecified chronic kidney disease: Secondary | ICD-10-CM | POA: Diagnosis present

## 2019-03-16 DIAGNOSIS — B9689 Other specified bacterial agents as the cause of diseases classified elsewhere: Secondary | ICD-10-CM | POA: Diagnosis not present

## 2019-03-16 DIAGNOSIS — R5383 Other fatigue: Secondary | ICD-10-CM | POA: Diagnosis not present

## 2019-03-16 DIAGNOSIS — Z7902 Long term (current) use of antithrombotics/antiplatelets: Secondary | ICD-10-CM | POA: Diagnosis not present

## 2019-03-17 DIAGNOSIS — N390 Urinary tract infection, site not specified: Secondary | ICD-10-CM | POA: Diagnosis not present

## 2019-03-17 DIAGNOSIS — R4182 Altered mental status, unspecified: Secondary | ICD-10-CM | POA: Diagnosis not present

## 2019-03-17 DIAGNOSIS — N179 Acute kidney failure, unspecified: Secondary | ICD-10-CM | POA: Diagnosis not present

## 2019-03-17 DIAGNOSIS — I4892 Unspecified atrial flutter: Secondary | ICD-10-CM | POA: Diagnosis not present

## 2019-03-17 DIAGNOSIS — R945 Abnormal results of liver function studies: Secondary | ICD-10-CM | POA: Diagnosis not present

## 2019-03-17 DIAGNOSIS — D72829 Elevated white blood cell count, unspecified: Secondary | ICD-10-CM | POA: Diagnosis not present

## 2019-03-17 DIAGNOSIS — I2510 Atherosclerotic heart disease of native coronary artery without angina pectoris: Secondary | ICD-10-CM | POA: Diagnosis not present

## 2019-03-17 DIAGNOSIS — E871 Hypo-osmolality and hyponatremia: Secondary | ICD-10-CM | POA: Diagnosis not present

## 2019-03-17 DIAGNOSIS — I509 Heart failure, unspecified: Secondary | ICD-10-CM | POA: Diagnosis not present

## 2019-03-17 DIAGNOSIS — J449 Chronic obstructive pulmonary disease, unspecified: Secondary | ICD-10-CM | POA: Diagnosis not present

## 2019-03-23 ENCOUNTER — Other Ambulatory Visit: Payer: Self-pay

## 2019-03-23 MED ORDER — METOPROLOL TARTRATE 50 MG PO TABS: 50.0000 mg | ORAL_TABLET | Freq: Two times a day (BID) | ORAL | 1 refills | Status: DC

## 2019-03-23 NOTE — Telephone Encounter (Signed)
Metoprolol refill approval

## 2019-03-24 ENCOUNTER — Telehealth (HOSPITAL_COMMUNITY): Payer: Self-pay | Admitting: *Deleted

## 2019-03-24 DIAGNOSIS — J449 Chronic obstructive pulmonary disease, unspecified: Secondary | ICD-10-CM | POA: Diagnosis not present

## 2019-03-24 DIAGNOSIS — N179 Acute kidney failure, unspecified: Secondary | ICD-10-CM | POA: Diagnosis not present

## 2019-03-24 DIAGNOSIS — N289 Disorder of kidney and ureter, unspecified: Secondary | ICD-10-CM | POA: Diagnosis not present

## 2019-03-24 NOTE — Telephone Encounter (Signed)
133/72  HR 70  Oxygen  98  Vitals taken today by pts wife  pts daughter states pt is doing well   Pt has appt with Dr. Tami Lin today And nephrologist tomorrow.    Visit on Friday with Butch Penny converted to phone visit and not virtual per pt req

## 2019-03-25 DIAGNOSIS — I509 Heart failure, unspecified: Secondary | ICD-10-CM | POA: Diagnosis not present

## 2019-03-25 DIAGNOSIS — N179 Acute kidney failure, unspecified: Secondary | ICD-10-CM | POA: Diagnosis not present

## 2019-03-25 DIAGNOSIS — D631 Anemia in chronic kidney disease: Secondary | ICD-10-CM | POA: Diagnosis not present

## 2019-03-25 DIAGNOSIS — I480 Paroxysmal atrial fibrillation: Secondary | ICD-10-CM | POA: Diagnosis not present

## 2019-03-25 DIAGNOSIS — N183 Chronic kidney disease, stage 3 (moderate): Secondary | ICD-10-CM | POA: Diagnosis not present

## 2019-03-25 DIAGNOSIS — I129 Hypertensive chronic kidney disease with stage 1 through stage 4 chronic kidney disease, or unspecified chronic kidney disease: Secondary | ICD-10-CM | POA: Diagnosis not present

## 2019-03-25 DIAGNOSIS — Z950 Presence of cardiac pacemaker: Secondary | ICD-10-CM | POA: Diagnosis not present

## 2019-03-25 DIAGNOSIS — Z7901 Long term (current) use of anticoagulants: Secondary | ICD-10-CM | POA: Diagnosis not present

## 2019-03-25 DIAGNOSIS — E1122 Type 2 diabetes mellitus with diabetic chronic kidney disease: Secondary | ICD-10-CM | POA: Diagnosis not present

## 2019-03-25 DIAGNOSIS — K922 Gastrointestinal hemorrhage, unspecified: Secondary | ICD-10-CM | POA: Diagnosis not present

## 2019-03-27 ENCOUNTER — Ambulatory Visit (HOSPITAL_COMMUNITY)
Admission: RE | Admit: 2019-03-27 | Discharge: 2019-03-27 | Disposition: A | Discharge status: Home / Self Care | Payer: Medicare Other | Source: Ambulatory Visit | Attending: Nurse Practitioner | Admitting: Nurse Practitioner

## 2019-03-27 ENCOUNTER — Other Ambulatory Visit: Payer: Self-pay

## 2019-03-27 DIAGNOSIS — I480 Paroxysmal atrial fibrillation: Secondary | ICD-10-CM

## 2019-03-27 NOTE — Progress Notes (Signed)
Electrophysiology TeleHealth Note   Due to national recommendations of social distancing due to Iron 19, Audio/videotelehealth visit is felt to be most appropriate for this patient at this time.  See MyChart message/consent below from today for patient consent regarding telehealth for the Atrial Fibrillation Clinic.    Date:  03/27/2019   ID:  Lawrence [name], DOB 05-Sep-1946, MRN [id number]  Location: home Provider location: [place], [place] [address] Evaluation Performed:  patient/Follow up ablation  PCP:  [name], MD  Primary Cardiologist:  Dr. [name] Primary Electrophysiologist: Dr. Curt Bears  CC afib   History of Present Illness: Lawrence [name] is a 73 y.o. male who presents via audio/video conferencing for a telehealth visit today.   The patient is referred [place] f/u ablation. He reprts that he is doing well. A little afib first couple of days but other that that NSR. H denies any groin or swallowing issues. He is back to usual activites without any shortness of breath.His V/s reported this am with BP of 140/76, HR 76 and regular, PO- 95%, temp 98 degrees.   Today, he denies symptoms of palpitations, chest pain, shortness of breath, orthopnea, PND, lower extremity edema, claudication, dizziness, presyncope, syncope, bleeding, or neurologic sequela. The patient is tolerating medications without difficulties and is otherwise without complaint today.   he denies symptoms of cough, fevers, chills, or new SOB worrisome for COVID 19.   Atrial Fibrillation Risk Factors:  he does have symptoms or diagnosis of sleep apnea. he is compliant with CPAP therapy. he does not have a history of rheumatic fever. he does have a history of alcohol use. The patient does not have a history of early familial atrial fibrillation or other arrhythmias.  he has a BMI of There is no height or weight on file to calculate BMI.. There were no vitals filed for this visit.   Past Medical History:  Diagnosis Date  . Anemia   . Chronic airway obstruction, not elsewhere classified   . Chronic ischemic heart disease, unspecified   . Coronary artery disease   . Diabetes mellitus    type II, neuropathy,   . Essential hypertension, benign   . Hyperlipidemia, mixed   . Nodule of kidney    incidentally found on CT abdomen 11/2012.  Pending Urology evaluation.   . Nontoxic multinodular goiter   . Obesity   . Other testicular hypofunction   . PUD (peptic ulcer disease)   . Renal insufficiency, mild    hx of  . Sleep apnea    uses cpap   Past Surgical History:  Procedure Laterality Date  . ATRIAL FIBRILLATION ABLATION N/A 02/05/2019   Procedure: ATRIAL FIBRILLATION ABLATION;  Surgeon: Constance Haw, MD;  Location: [facility];  Service: Cardiovascular;  Laterality: N/A;  . CHOLECYSTECTOMY    . COLONOSCOPY  05/11/2015   Colonic polyps statuts post polypectomy. Moderate predominalty sigmoid diverticulosis. Internal hemorrhoids. Tubular adenoma.   . CORONARY ARTERY BYPASS GRAFT  2007   x 4  . ESOPHAGOGASTRODUODENOSCOPY     10/06/2012:  hiatal hernia, moderate gastritis.  2012: Colonoscopy at [facility]:  one polyp.  Due next 2017  . EYE SURGERY    . KNEE DEBRIDEMENT    . LAPAROTOMY N/A 07/21/2018   Procedure: EXPLORATORY LAPAROTOMY WITH SMALL BOWEL RESECTION;  Surgeon: Erroll Luna, MD;  Location: [facility];  Service: General;  Laterality: N/A;  . PACEMAKER IMPLANT N/A 08/22/2017   Procedure: Pacemaker Implant;  Surgeon: Constance Haw, MD;  Location: [facility];  Service: Cardiovascular;  Laterality: N/A;  . SHOULDER SURGERY     left shoulder  . TONSILECTOMY, ADENOIDECTOMY, BILATERAL MYRINGOTOMY AND TUBES       Current Outpatient Medications  Medication Sig Dispense Refill  . apixaban (ELIQUIS) 5 MG TABS tablet Take 5 mg by mouth 2 (two) times daily.    . Cholecalciferol (VITAMIN D3) 2000 units TABS Take 2,000 Units by mouth daily.    .  furosemide (LASIX) 40 MG tablet Take 40 mg by mouth daily.     . insulin glargine (LANTUS) 100 unit/mL SOPN Inject 3 Units into the skin daily as needed (for HBS > 200).     . Ipratropium-Albuterol (COMBIVENT RESPIMAT) 20-100 MCG/ACT AERS respimat Inhale 1 puff into the lungs daily as needed for wheezing or shortness of breath.     Marland Kitchen ipratropium-albuterol (DUONEB) 0.5-2.5 (3) MG/3ML SOLN Take 3 mLs by nebulization every 6 (six) hours as needed (for shortness of breath or wheezing).     Marland Kitchen linagliptin (TRADJENTA) 5 MG TABS tablet Take 5 mg by mouth daily as needed (for HBS >140).     . metolazone (ZAROXOLYN) 2.5 MG tablet Take 1 tablet (2.5 mg total) by mouth every Monday, Wednesday, and Friday.    . metoprolol tartrate (LOPRESSOR) 50 MG tablet Take 1 tablet (50 mg total) by mouth 2 (two) times daily. 180 tablet 1  . nitroGLYCERIN (NITROSTAT) 0.4 MG SL tablet Place 0.4 mg under the tongue every 5 (five) minutes as needed for chest pain.     Marland Kitchen oxyCODONE (OXY IR/ROXICODONE) 5 MG immediate release tablet Take 5 mg by mouth 2 (two) times daily as needed for severe pain.    . pantoprazole (PROTONIX) 40 MG tablet Take 40 mg by mouth daily before breakfast.     . simvastatin (ZOCOR) 20 MG tablet Take 20 mg by mouth at bedtime.      No current facility-administered medications for this encounter.     Allergies:   Ciprofloxacin; Sulfamethoxazole-trimethoprim; Lisinopril; and Naproxen   Social History:  The patient  reports that he quit smoking about 14 years ago. His smoking use included cigarettes. He has a 45.00 pack-year smoking history. He has quit using smokeless tobacco.  His smokeless tobacco use included chew. He reports current alcohol use. He reports that he does not use drugs.   Family History:  The patient's  family history includes Asthma in his sister; Cancer in his father and mother; Colon cancer in his mother; Emphysema in an other family member; Esophageal cancer in his father; Hypertension  in his mother; Kidney cancer in his mother; Stroke in his father.    ROS:  Please see the history of present illness.   All other systems are personally reviewed and negative.   Exam: NA  Phone visit   Recent Labs: 05/16/2018: NT-Pro BNP 3,272 07/21/2018: ALT 33 07/31/2018: Magnesium 1.8 01/27/2019: Hemoglobin 11.7; Platelets 349 02/02/2019: BUN 25; Creatinine, Ser 1.20; Potassium 4.9; Sodium 138  personally reviewed    Other studies personally reviewed: None  Epic records reviewed    ASSESSMENT AND PLAN:  1.  Paroxysmal atrial fibrillation S/p ablation Appears  to be maintaining SR Dong well Reminded not to interrupt anticoagulation Continue  eliquis 5 mg bid  This patients CHA2DS2-VASc Score and unadjusted Ischemic Stroke Rate (% per year) is equal to 3.2 % stroke rate/year from a score of 3  Above score calculated as 1 point each  if present [CHF, HTN, DM, Vascular=MI/PAD/Aortic Plaque, Age if 19-74, or Male] Above score calculated as 2 points each if present [Age > 75, or Stroke/TIA/TE]   COVID screen The patient does not have any symptoms that suggest any further testing/ screening at this time.  Social distancing reinforced today.    Follow-up: with Dr. Curt Bears as scheduled in June  Current medicines are reviewed at length with the patient today.   The patient does not have concerns regarding his medicines.  The following changes were made today:  none  Labs/ tests ordered today include: none  No orders of the defined types were placed in this encounter.   Patient Risk:  after full review of this patients clinical status, I feel that they are at moderate  risk at this time.   Today, I have spent  10  minutes with the patient with telehealth technology discussing afib ablation.    Eduard Roux NP  03/27/2019 12:55 PM  Afib [facility] [address] [place], [place] [address] [phone]   I hereby voluntarily request,  consent and authorize the [facility] and its employed or contracted physicians, physician assistants, nurse practitioners or other licensed health care professionals (the Practitioner), to provide me with telemedicine health care services (the "Services") as deemed necessary by the treating Practitioner. I acknowledge and consent to receive the Services by the Practitioner via telemedicine. I understand that the telemedicine visit will involve communicating with the Practitioner through live audiovisual communication technology and the disclosure of certain medical information by electronic transmission. I acknowledge that I have been given the opportunity to request an in-person assessment or other available alternative prior to the telemedicine visit and am voluntarily participating in the telemedicine visit.   I understand that I have the right to withhold or withdraw my consent to the use of telemedicine in the course of my care at any time, without affecting my right to future care or treatment, and that the Practitioner or I may terminate the telemedicine visit at any time. I understand that I have the right to inspect all information obtained and/or recorded in the course of the telemedicine visit and may receive copies of available information for a reasonable fee.  I understand that some of the potential risks of receiving the Services via telemedicine include:   Delay or interruption in medical evaluation due to technological equipment failure or disruption;  Information transmitted may not be sufficient (e.g. poor resolution of images) to allow for appropriate medical decision making by the Practitioner; and/or  In rare instances, security protocols could fail, causing a breach of personal health information.   Furthermore, I acknowledge that it is my responsibility to provide information about my medical history, conditions and care that is complete and accurate to the best of my  ability. I acknowledge that Practitioner's advice, recommendations, and/or decision may be based on factors not within their control, such as incomplete or inaccurate data provided by me or distortions of diagnostic images or specimens that may result from electronic transmissions. I understand that the practice of medicine is not an exact science and that Practitioner makes no warranties or guarantees regarding treatment outcomes. I acknowledge that I will receive a copy of this consent concurrently upon execution via email to the email address I last provided but may also request a printed copy by calling the office of the [facility].  I understand that my insurance will be billed for this visit.  I have read or had this consent read to me.  I understand the contents of this consent, which adequately explains the benefits and risks of the Services being provided via telemedicine.  I have been provided ample opportunity to ask questions regarding this consent and the Services and have had my questions answered to my satisfaction.  I give my informed consent for the services to be provided through the use of telemedicine in my medical care  By participating in this telemedicine visit I agree to the above.

## 2019-04-20 ENCOUNTER — Telehealth: Payer: Self-pay | Admitting: *Deleted

## 2019-04-20 NOTE — Telephone Encounter (Signed)

## 2019-04-27 ENCOUNTER — Other Ambulatory Visit: Payer: Self-pay

## 2019-04-27 ENCOUNTER — Telehealth (INDEPENDENT_AMBULATORY_CARE_PROVIDER_SITE_OTHER): Payer: Medicare Other | Admitting: Cardiology

## 2019-04-27 ENCOUNTER — Telehealth: Payer: Self-pay | Admitting: Cardiology

## 2019-04-27 DIAGNOSIS — I480 Paroxysmal atrial fibrillation: Secondary | ICD-10-CM | POA: Diagnosis not present

## 2019-04-27 NOTE — Telephone Encounter (Signed)
Virtual Visit Pre-Appointment Phone Call  "(Name), I am calling you today to discuss your upcoming appointment. We are currently trying to limit exposure to the virus that causes COVID-19 by seeing patients at home rather than in the office."  1. "What is the BEST phone number to call the day of the visit?" - include this in appointment notes  2. Do you have or have access to (through a family member/friend) a smartphone with video capability that we can use for your visit?" a. If yes - list this number in appt notes as cell (if different from BEST phone #) and list the appointment type as a VIDEO visit in appointment notes b. If no - list the appointment type as a PHONE visit in appointment notes  3. Confirm consent - "In the setting of the current Covid19 crisis, you are scheduled for a (phone or video) visit with your provider on (date) at (time).  Just as we do with many in-office visits, in order for you to participate in this visit, we must obtain consent.  If you'd like, I can send this to your mychart (if signed up) or email for you to review.  Otherwise, I can obtain your verbal consent now.  All virtual visits are billed to your insurance company just like a normal visit would be.  By agreeing to a virtual visit, we'd like you to understand that the technology does not allow for your provider to perform an examination, and thus may limit your provider's ability to fully assess your condition. If your provider identifies any concerns that need to be evaluated in person, we will make arrangements to do so.  Finally, though the technology is pretty good, we cannot assure that it will always work on either your or our end, and in the setting of a video visit, we may have to convert it to a phone-only visit.  In either situation, we cannot ensure that we have a secure connection.  Are you willing to proceed?" STAFF: Did the patient verbally acknowledge consent to telehealth visit? Document  YES/NO here: YES  4. Advise patient to be prepared - "Two hours prior to your appointment, go ahead and check your blood pressure, pulse, oxygen saturation, and your weight (if you have the equipment to check those) and write them all down. When your visit starts, your provider will ask you for this information. If you have an Apple Watch or Kardia device, please plan to have heart rate information ready on the day of your appointment. Please have a pen and paper handy nearby the day of the visit as well."  5. Give patient instructions for MyChart download to smartphone OR Doximity/Doxy.me as below if video visit (depending on what platform provider is using)  6. Inform patient they will receive a phone call 15 minutes prior to their appointment time (may be from unknown caller ID) so they should be prepared to answer    TELEPHONE CALL NOTE  Lawrence [name] has been deemed a candidate for a follow-up tele-health visit to limit community exposure during the Covid-19 pandemic. I spoke with the patient via phone to ensure availability of phone/video source, confirm preferred email & phone number, and discuss instructions and expectations.  I reminded Lawrence [name] to be prepared with any vital sign and/or heart rhythm information that could potentially be obtained via home monitoring, at the time of his visit. I reminded Lawrence [name] to expect a phone call prior to  his visit.  [name] 04/27/2019 3:52 PM   INSTRUCTIONS FOR DOWNLOADING THE MYCHART APP TO SMARTPHONE  - The patient must first make sure to have activated MyChart and know their login information - If Apple, go to [employer] and type in MyChart in the search bar and download the app. If Android, ask patient to go to [employer] and type in [place] in the search bar and download the app. The app is free but as with any other app downloads, their phone may require them to verify saved payment information or Apple/Android  password.  - The patient will need to then log into the app with their MyChart username and password, and select [facility] as their healthcare provider to link the account. When it is time for your visit, go to the MyChart app, find appointments, and click Begin Video Visit. Be sure to Select Allow for your device to access the Microphone and Camera for your visit. You will then be connected, and your provider will be with you shortly.  **If they have any issues connecting, or need assistance please contact MyChart service desk [phone])**  **If using a computer, in order to ensure the best quality for their visit they will need to use either of the following Internet Browsers: [employer], or Google Chrome**  IF USING DOXIMITY or DOXY.ME - The patient will receive a link just prior to their visit by text.     FULL LENGTH CONSENT FOR TELE-HEALTH VISIT   I hereby voluntarily request, consent and authorize [facility] and its employed or contracted physicians, physician assistants, nurse practitioners or other licensed health care professionals (the Practitioner), to provide me with telemedicine health care services (the Services") as deemed necessary by the treating Practitioner. I acknowledge and consent to receive the Services by the Practitioner via telemedicine. I understand that the telemedicine visit will involve communicating with the Practitioner through live audiovisual communication technology and the disclosure of certain medical information by electronic transmission. I acknowledge that I have been given the opportunity to request an in-person assessment or other available alternative prior to the telemedicine visit and am voluntarily participating in the telemedicine visit.  I understand that I have the right to withhold or withdraw my consent to the use of telemedicine in the course of my care at any time, without affecting my right to future care or treatment,  and that the Practitioner or I may terminate the telemedicine visit at any time. I understand that I have the right to inspect all information obtained and/or recorded in the course of the telemedicine visit and may receive copies of available information for a reasonable fee.  I understand that some of the potential risks of receiving the Services via telemedicine include:   Delay or interruption in medical evaluation due to technological equipment failure or disruption;  Information transmitted may not be sufficient (e.g. poor resolution of images) to allow for appropriate medical decision making by the Practitioner; and/or   In rare instances, security protocols could fail, causing a breach of personal health information.  Furthermore, I acknowledge that it is my responsibility to provide information about my medical history, conditions and care that is complete and accurate to the best of my ability. I acknowledge that Practitioner's advice, recommendations, and/or decision may be based on factors not within their control, such as incomplete or inaccurate data provided by me or distortions of diagnostic images or specimens that may result from electronic transmissions. I  understand that the practice of medicine is not an exact science and that Practitioner makes no warranties or guarantees regarding treatment outcomes. I acknowledge that I will receive a copy of this consent concurrently upon execution via email to the email address I last provided but may also request a printed copy by calling the office of [facility].    I understand that my insurance will be billed for this visit.   I have read or had this consent read to me.  I understand the contents of this consent, which adequately explains the benefits and risks of the Services being provided via telemedicine.   I have been provided ample opportunity to ask questions regarding this consent and the Services and have had my questions  answered to my satisfaction.  I give my informed consent for the services to be provided through the use of telemedicine in my medical care  By participating in this telemedicine visit I agree to the above.

## 2019-04-27 NOTE — Progress Notes (Signed)
Electrophysiology TeleHealth Note   Due to national recommendations of social distancing due to COVID 19, an audio/video telehealth visit is felt to be most appropriate for this patient at this time.  See Epic message for the patient's consent to telehealth for [facility].   Date:  04/27/2019   ID:  Lawrence [name], DOB 02/28/1946, MRN [id number]  Location: patient's home  Provider location: [address], [place]  Evaluation Performed: Follow-up visit  PCP:  [name], MD  Cardiologist:  [name] Electrophysiologist:  Dr Curt Bears  Chief Complaint:  Atrial fibrillation  History of Present Illness:    Lawrence [name] is a 73 y.o. male who presents via audio/video conferencing for a telehealth visit today.  Since last being seen in our clinic, the patient reports doing very well.  Today, he denies symptoms of palpitations, chest pain, shortness of breath,  lower extremity edema, dizziness, presyncope, or syncope.  The patient is otherwise without complaint today.  The patient denies symptoms of fevers, chills, cough, or new SOB worrisome for COVID 19.  He has a history significant for coronary artery disease, type 2 diabetes, hypertension, and atrial fibrillation.  He is status post AF ablation 02/05/2019.  Today, denies symptoms of palpitations, chest pain, shortness of breath, orthopnea, PND, lower extremity edema, claudication, dizziness, presyncope, syncope, bleeding, or neurologic sequela. The patient is tolerating medications without difficulties.  Overall he is feeling well.  He has no chest pain or shortness of breath.  Is able to do all his daily activities.  He has had minimal symptoms from atrial fibrillation since his ablation.  Past Medical History:  Diagnosis Date  . Anemia   . Chronic airway obstruction, not elsewhere classified   . Chronic ischemic heart disease, unspecified   . Coronary artery disease   . Diabetes mellitus    type II,  neuropathy,   . Essential hypertension, benign   . Hyperlipidemia, mixed   . Nodule of kidney    incidentally found on CT abdomen 11/2012.  Pending Urology evaluation.   . Nontoxic multinodular goiter   . Obesity   . Other testicular hypofunction   . PUD (peptic ulcer disease)   . Renal insufficiency, mild    hx of  . Sleep apnea    uses cpap    Past Surgical History:  Procedure Laterality Date  . ATRIAL FIBRILLATION ABLATION N/A 02/05/2019   Procedure: ATRIAL FIBRILLATION ABLATION;  Surgeon: Constance Haw, MD;  Location: [facility];  Service: Cardiovascular;  Laterality: N/A;  . CHOLECYSTECTOMY    . COLONOSCOPY  05/11/2015   Colonic polyps statuts post polypectomy. Moderate predominalty sigmoid diverticulosis. Internal hemorrhoids. Tubular adenoma.   . CORONARY ARTERY BYPASS GRAFT  2007   x 4  . ESOPHAGOGASTRODUODENOSCOPY     10/06/2012:  hiatal hernia, moderate gastritis.  2012: Colonoscopy at [facility]:  one polyp.  Due next 2017  . EYE SURGERY    . KNEE DEBRIDEMENT    . LAPAROTOMY N/A 07/21/2018   Procedure: EXPLORATORY LAPAROTOMY WITH SMALL BOWEL RESECTION;  Surgeon: Erroll Luna, MD;  Location: [facility];  Service: General;  Laterality: N/A;  . PACEMAKER IMPLANT N/A 08/22/2017   Procedure: Pacemaker Implant;  Surgeon: Constance Haw, MD;  Location: [facility];  Service: Cardiovascular;  Laterality: N/A;  . SHOULDER SURGERY     left shoulder  . TONSILECTOMY, ADENOIDECTOMY, BILATERAL MYRINGOTOMY AND TUBES      Current Outpatient Medications  Medication Sig Dispense Refill  . apixaban (ELIQUIS) 5 MG TABS tablet Take 5 mg by mouth 2 (two) times daily.    . Cholecalciferol (VITAMIN D3) 2000 units TABS Take 2,000 Units by mouth daily.    . furosemide (LASIX) 40 MG tablet Take 40 mg by mouth daily.     . insulin glargine (LANTUS) 100 unit/mL SOPN Inject 3 Units into the skin daily as needed (for HBS > 200).     . Ipratropium-Albuterol (COMBIVENT RESPIMAT)  20-100 MCG/ACT AERS respimat Inhale 1 puff into the lungs daily as needed for wheezing or shortness of breath.     Marland Kitchen ipratropium-albuterol (DUONEB) 0.5-2.5 (3) MG/3ML SOLN Take 3 mLs by nebulization every 6 (six) hours as needed (for shortness of breath or wheezing).     Marland Kitchen linagliptin (TRADJENTA) 5 MG TABS tablet Take 5 mg by mouth daily as needed (for HBS >140).     . metolazone (ZAROXOLYN) 2.5 MG tablet Take 1 tablet (2.5 mg total) by mouth every Monday, Wednesday, and Friday.    . metoprolol tartrate (LOPRESSOR) 50 MG tablet Take 1 tablet (50 mg total) by mouth 2 (two) times daily. 180 tablet 1  . nitroGLYCERIN (NITROSTAT) 0.4 MG SL tablet Place 0.4 mg under the tongue every 5 (five) minutes as needed for chest pain.     Marland Kitchen oxyCODONE (OXY IR/ROXICODONE) 5 MG immediate release tablet Take 5 mg by mouth 2 (two) times daily as needed for severe pain.    . pantoprazole (PROTONIX) 40 MG tablet Take 40 mg by mouth daily before breakfast.     . simvastatin (ZOCOR) 20 MG tablet Take 20 mg by mouth at bedtime.      No current facility-administered medications for this visit.     Allergies:   Ciprofloxacin; Sulfamethoxazole-trimethoprim; Lisinopril; and Naproxen   Social History:  The patient  reports that he quit smoking about 14 years ago. His smoking use included cigarettes. He has a 45.00 pack-year smoking history. He has quit using smokeless tobacco.  His smokeless tobacco use included chew. He reports current alcohol use. He reports that he does not use drugs.   Family History:  The patient's  family history includes Asthma in his sister; Cancer in his father and mother; Colon cancer in his mother; Emphysema in an other family member; Esophageal cancer in his father; Hypertension in his mother; Kidney cancer in his mother; Stroke in his father.   ROS:  Please see the history of present illness.   All other systems are personally reviewed and negative.    Exam:    Vital Signs:  BP 132/71    Pulse 69   Temp 98.1 F (36.7 C)   Wt 194 lb (88 kg)   SpO2 98%   BMI 26.68 kg/m   Well appearing, alert and conversant, regular work of breathing,  good skin color Eyes- anicteric, neuro- grossly intact, skin- no apparent rash or lesions or cyanosis, mouth- oral mucosa is pink   Labs/Other Tests and Data Reviewed:    Recent Labs: 05/16/2018: NT-Pro BNP 3,272 07/21/2018: ALT 33 07/31/2018: Magnesium 1.8 01/27/2019: Hemoglobin 11.7; Platelets 349 02/02/2019: BUN 25; Creatinine, Ser 1.20; Potassium 4.9; Sodium 138   Wt Readings from Last 3 Encounters:  04/27/19 194 lb (88 kg)  02/05/19 195 lb (88.5 kg)  02/02/19 200 lb (90.7 kg)     Other studies personally reviewed: Additional studies/ records that were reviewed today include: ECG 02/05/2019 personally reviewed Review of the above records today demonstrates: SVT,  rate 125   Last device remote is reviewed from [facility] PDF dated 02/25/2019 which reveals normal device function, short HVR episodes   ASSESSMENT & PLAN:    1.  Paroxysmal atrial fibrillation: Status post ablation.  Currently on Eliquis.  He has not noted any further episodes of atrial fibrillation.  No changes.  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  He has been asymptomatic.  No changes.  This patients CHA2DS2-VASc Score and unadjusted Ischemic Stroke Rate (% per year) is equal to 3.2 % stroke rate/year from a score of 3  Above score calculated as 1 point each if present [CHF, HTN, DM, Vascular=MI/PAD/Aortic Plaque, Age if 65-74, or Male] Above score calculated as 2 points each if present [Age > 75, or Stroke/TIA/TE]  2.  Sick sinus syndrome: Status post Saint Jude dual-chamber pacemaker.  Device functioning appropriately.  No changes.  3.  Hypertension: Currently well controlled.  No changes.  4.  Hyperlipidemia: Continue statin   COVID 19 screen The patient denies symptoms of COVID 19 at this time.  The importance of social distancing  was discussed today.  Follow-up: 3 months   Current medicines are reviewed at length with the patient today.   The patient does not have concerns regarding his medicines.  The following changes were made today:  none  Labs/ tests ordered today include:  No orders of the defined types were placed in this encounter.    Patient Risk:  after full review of this patients clinical status, I feel that they are at moderate risk at this time.  Today, I have spent 12 minutes with the patient with telehealth technology discussing atrial fibrillation.    Signed, Jillyn Stacey Meredith Leeds, MD  04/27/2019 11:17 AM     [facility] [address] [place] [address] [phone] (office) [phone] (fax)

## 2019-05-04 ENCOUNTER — Other Ambulatory Visit: Payer: Self-pay

## 2019-05-04 ENCOUNTER — Telehealth: Payer: Medicare Other | Admitting: Cardiology

## 2019-05-08 ENCOUNTER — Other Ambulatory Visit: Payer: Self-pay

## 2019-05-08 ENCOUNTER — Encounter: Payer: Self-pay | Admitting: Cardiology

## 2019-05-08 ENCOUNTER — Telehealth (INDEPENDENT_AMBULATORY_CARE_PROVIDER_SITE_OTHER): Payer: Medicare Other | Admitting: Cardiology

## 2019-05-08 DIAGNOSIS — I2510 Atherosclerotic heart disease of native coronary artery without angina pectoris: Secondary | ICD-10-CM

## 2019-05-08 DIAGNOSIS — Z950 Presence of cardiac pacemaker: Secondary | ICD-10-CM

## 2019-05-08 DIAGNOSIS — I480 Paroxysmal atrial fibrillation: Secondary | ICD-10-CM | POA: Diagnosis not present

## 2019-05-08 DIAGNOSIS — Z951 Presence of aortocoronary bypass graft: Secondary | ICD-10-CM

## 2019-05-08 DIAGNOSIS — I5032 Chronic diastolic (congestive) heart failure: Secondary | ICD-10-CM

## 2019-05-08 DIAGNOSIS — I272 Pulmonary hypertension, unspecified: Secondary | ICD-10-CM

## 2019-05-08 NOTE — Addendum Note (Signed)
Addended by: Ashok Norris on: 05/08/2019 09:53 AM   Modules accepted: Orders

## 2019-05-08 NOTE — Patient Instructions (Signed)
Medication Instructions:  Your physician recommends that you continue on your current medications as directed. Please refer to the Current Medication list given to you today.  If you need a refill on your cardiac medications before your next appointment, please call your pharmacy.   Lab work: Your physician recommends that you return for lab work next week: bmp, bnp   If you have labs (blood work) drawn today and your tests are completely normal, you will receive your results only by: Marland Kitchen MyChart Message (if you have MyChart) OR . A paper copy in the mail If you have any lab test that is abnormal or we need to change your treatment, we will call you to review the results.  Testing/Procedures: You will have a ekg next week.   Follow-Up: At [facility], you and your health needs are our priority.  As part of our continuing mission to provide you with exceptional heart care, we have created designated Provider Care Teams.  These Care Teams include your primary Cardiologist (physician) and Advanced Practice Providers (APPs -  Physician Assistants and Nurse Practitioners) who all work together to provide you with the care you need, when you need it. You will need a follow up appointment in 3 months.  Please call our office 2 months in advance to schedule this appointment.  You may see No primary care provider on file. or another member of our [employer] Provider Team in [place]: Shirlee More, MD . Jyl Heinz, MD    Any Other Special Instructions Will Be Listed Below (If Applicable).

## 2019-05-08 NOTE — Progress Notes (Signed)
Virtual Visit via Video Note   This visit type was conducted due to national recommendations for restrictions regarding the COVID-19 Pandemic (e.g. social distancing) in an effort to limit this patient's exposure and mitigate transmission in our community.  Due to his co-morbid illnesses, this patient is at least at moderate risk for complications without adequate follow up.  This format is felt to be most appropriate for this patient at this time.  All issues noted in this document were discussed and addressed.  A limited physical exam was performed with this format.  Please refer to the patient's chart for his consent to telehealth for [facility].  Evaluation Performed:  Follow-up visit  This visit type was conducted due to national recommendations for restrictions regarding the COVID-19 Pandemic (e.g. social distancing).  This format is felt to be most appropriate for this patient at this time.  All issues noted in this document were discussed and addressed.  No physical exam was performed (except for noted visual exam findings with Video Visits).  Please refer to the patient's chart (MyChart message for video visits and phone note for telephone visits) for the patient's consent to telehealth for [facility].  Date:  05/08/2019  ID: Lawrence [name], DOB [birth year], MRN [id number]   Patient Location: [place] [address]   Provider location:   [place] Office  PCP:  [name], MD  Cardiologist:  [name], MD     Chief Complaint: Doing well  History of Present Illness:    Lawrence [name] is a 73 y.o. male  who presents via audio/video conferencing for a telehealth visit today.  Complex past medical history which include coronary artery disease, paroxysmal atrial fibrillation status post atrial fibrillation ablation, pacemaker present he does have a video visit with me today and he is doing amazingly well in the matter-of-fact I have to  postpone the visit for 10 minutes because he was cutting grass outside and his wife had to go and get him he does use lawnmower that he sits on but still I am impressed that he is able to do all those things.  He complained that after his last hospitalization with almost disastrous consequences because of bowel obstruction he complained of muscle loss but overall he is getting gradually better denies having palpitations no chest pain tightness squeezing pressure burning chest.  No dizziness interestingly some old cIA doctor as he said told him that Plaquenil is the one that he need to take to protect himself from having coronavirus infection.  So 3 days ago he started taking Plaquenil 100 mg 3 times daily and surprisingly to him his arthritic pain for which he have to take narcotics got dramatically better and that is why he is able to go and cut grass today.  I told him that this is not advisable to take medication as a protection against coronavirus infection but he still wants to continue when I cannot prevent him from doing it.  Therefore, I will bring him to the office next week and I will do EKG on him to make sure he does not have significant QT prolongation I want him about potential side effect of this medication.   The patient does not have symptoms concerning for COVID-19 infection (fever, chills, cough, or new SHORTNESS OF BREATH).    Prior CV studies:   The following studies were reviewed today:       Past Medical History:  Diagnosis  Date  . Anemia   . Chronic airway obstruction, not elsewhere classified   . Chronic ischemic heart disease, unspecified   . Coronary artery disease   . Diabetes mellitus    type II, neuropathy,   . Essential hypertension, benign   . Hyperlipidemia, mixed   . Nodule of kidney    incidentally found on CT abdomen 11/2012.  Pending Urology evaluation.   . Nontoxic multinodular goiter   . Obesity   . Other testicular hypofunction   . PUD (peptic  ulcer disease)   . Renal insufficiency, mild    hx of  . Sleep apnea    uses cpap    Past Surgical History:  Procedure Laterality Date  . ATRIAL FIBRILLATION ABLATION N/A 02/05/2019   Procedure: ATRIAL FIBRILLATION ABLATION;  Surgeon: Constance Haw, MD;  Location: [place] [facility];  Service: Cardiovascular;  Laterality: N/A;  . CHOLECYSTECTOMY    . COLONOSCOPY  05/11/2015   Colonic polyps statuts post polypectomy. Moderate predominalty sigmoid diverticulosis. Internal hemorrhoids. Tubular adenoma.   . CORONARY ARTERY BYPASS GRAFT  2007   x 4  . ESOPHAGOGASTRODUODENOSCOPY     10/06/2012:  hiatal hernia, moderate gastritis.  2012: Colonoscopy at [facility]:  one polyp.  Due next 2017  . EYE SURGERY    . KNEE DEBRIDEMENT    . LAPAROTOMY N/A 07/21/2018   Procedure: EXPLORATORY LAPAROTOMY WITH SMALL BOWEL RESECTION;  Surgeon: Erroll Luna, MD;  Location: [place];  Service: General;  Laterality: N/A;  . PACEMAKER IMPLANT N/A 08/22/2017   Procedure: Pacemaker Implant;  Surgeon: Constance Haw, MD;  Location: [place] [facility];  Service: Cardiovascular;  Laterality: N/A;  . SHOULDER SURGERY     left shoulder  . TONSILECTOMY, ADENOIDECTOMY, BILATERAL MYRINGOTOMY AND TUBES       Current Meds  Medication Sig  . apixaban (ELIQUIS) 5 MG TABS tablet Take 5 mg by mouth 2 (two) times daily.  . Cholecalciferol (VITAMIN D3) 2000 units TABS Take 2,000 Units by mouth daily.  . furosemide (LASIX) 40 MG tablet Take 40 mg by mouth daily.   . insulin glargine (LANTUS) 100 unit/mL SOPN Inject 3 Units into the skin daily as needed (for HBS > 200).   . Ipratropium-Albuterol (COMBIVENT RESPIMAT) 20-100 MCG/ACT AERS respimat Inhale 1 puff into the lungs daily as needed for wheezing or shortness of breath.   Marland Kitchen ipratropium-albuterol (DUONEB) 0.5-2.5 (3) MG/3ML SOLN Take 3 mLs by nebulization every 6 (six) hours as needed (for shortness of breath or wheezing).   Marland Kitchen linagliptin (TRADJENTA) 5 MG TABS tablet  Take 5 mg by mouth daily as needed (for HBS >140).   . metolazone (ZAROXOLYN) 2.5 MG tablet Take 1 tablet (2.5 mg total) by mouth every Monday, Wednesday, and Friday.  . metoprolol tartrate (LOPRESSOR) 50 MG tablet Take 1 tablet (50 mg total) by mouth 2 (two) times daily.  . nitroGLYCERIN (NITROSTAT) 0.4 MG SL tablet Place 0.4 mg under the tongue every 5 (five) minutes as needed for chest pain.   Marland Kitchen oxyCODONE (OXY IR/ROXICODONE) 5 MG immediate release tablet Take 5 mg by mouth 2 (two) times daily as needed for severe pain.  . pantoprazole (PROTONIX) 40 MG tablet Take 40 mg by mouth daily before breakfast.   . simvastatin (ZOCOR) 20 MG tablet Take 20 mg by mouth at bedtime.       Family History: The patient's family history includes Asthma in his sister; Cancer in his father and mother; Colon cancer in his mother;  Emphysema in an other family member; Esophageal cancer in his father; Hypertension in his mother; Kidney cancer in his mother; Stroke in his father.   ROS:   Please see the history of present illness.     All other systems reviewed and are negative.   Labs/Other Tests and Data Reviewed:     Recent Labs: 05/16/2018: NT-Pro BNP 3,272 07/21/2018: ALT 33 07/31/2018: Magnesium 1.8 01/27/2019: Hemoglobin 11.7; Platelets 349 02/02/2019: BUN 25; Creatinine, Ser 1.20; Potassium 4.9; Sodium 138  Recent Lipid Panel No results found for: CHOL, TRIG, HDL, CHOLHDL, VLDL, LDLCALC, LDLDIRECT    Exam:    Vital Signs:  There were no vitals taken for this visit.    Wt Readings from Last 3 Encounters:  04/27/19 194 lb (88 kg)  02/05/19 195 lb (88.5 kg)  02/02/19 200 lb (90.7 kg)     Well nourished, well developed in no acute distress. Alert awake oriented x3.  He is coming from outside after cutting grass talking to me.  Denies having a swelling no JVD doing well.  Diagnosis for this visit:   1. Paroxysmal atrial fibrillation (HCC)   2. CHF (congestive heart failure), NYHA class II,  chronic, diastolic (HCC)   3. Pulmonary hypertension ([place])   4. Coronary artery disease involving native coronary artery of native heart without angina pectoris   5. Pacemaker   6. Status post coronary artery bypass graft      ASSESSMENT & PLAN:    1.  Paroxysmal atrial fibrillation denies having any.  Anticoagulated which I will continue. 2.  Congestive heart failure diastolic doing well seems to be compensated. 3.  Pulmonary hypertension.  Echocardiogram will be repeated in December of this year to recheck. 4.  Coronary artery disease stable without any symptoms. 5.  Pacemaker present followed by EP team recent interrogation normal function. 6.  Plaquenil use.  Against advice is.  He is using it to prevent coronavirus infection I told him that there is no data that this medication can prevent him from having coronavirus infection but he insisted on taking it.  Will bring him to the office next week to have EKG done as well as Chem-7 and proBNP.  COVID-19 Education: The signs and symptoms of COVID-19 were discussed with the patient and how to seek care for testing (follow up with PCP or arrange E-visit).  The importance of social distancing was discussed today.  Patient Risk:   After full review of this patients clinical status, I feel that they are at least moderate risk at this time.  Time:   Today, I have spent 18 minutes with the patient with telehealth technology discussing pt health issues.  I spent 5 minutes reviewing her chart before the visit.  Visit was finished at 9:31 AM.    Medication Adjustments/Labs and Tests Ordered: Current medicines are reviewed at length with the patient today.  Concerns regarding medicines are outlined above.  No orders of the defined types were placed in this encounter.  Medication changes: No orders of the defined types were placed in this encounter.    Disposition: Follow-up 3 months  Signed, Park Liter, MD, [facility] 05/08/2019 9:32  AM    [place]

## 2019-05-11 ENCOUNTER — Other Ambulatory Visit: Payer: Self-pay

## 2019-05-11 ENCOUNTER — Ambulatory Visit (INDEPENDENT_AMBULATORY_CARE_PROVIDER_SITE_OTHER): Payer: Medicare Other | Admitting: Cardiology

## 2019-05-11 DIAGNOSIS — M79606 Pain in leg, unspecified: Secondary | ICD-10-CM

## 2019-05-11 DIAGNOSIS — I272 Pulmonary hypertension, unspecified: Secondary | ICD-10-CM | POA: Diagnosis not present

## 2019-05-11 DIAGNOSIS — I5032 Chronic diastolic (congestive) heart failure: Secondary | ICD-10-CM | POA: Diagnosis not present

## 2019-05-11 DIAGNOSIS — I480 Paroxysmal atrial fibrillation: Secondary | ICD-10-CM | POA: Diagnosis not present

## 2019-05-11 DIAGNOSIS — Z950 Presence of cardiac pacemaker: Secondary | ICD-10-CM

## 2019-05-11 DIAGNOSIS — I2510 Atherosclerotic heart disease of native coronary artery without angina pectoris: Secondary | ICD-10-CM

## 2019-05-11 NOTE — Progress Notes (Signed)
Cardiology Office Note:    Date:  05/11/2019   ID:  Lawrence [name], DOB 1946/06/10, MRN [id number]  PCP:  [name], MD  Cardiologist:  [name], MD    Referring MD: [name], MD   No chief complaint on file. I am here to have EKG  History of Present Illness:    Lawrence [name] is a 73 y.o. male complex past medical history) into the office because [place] he is taking Plaquenil to protect him from having infection of coronavirus I talked to him that this is not advisable to do it but he still does not do so I brought him today to the office to check his EKG laterally his EKG showed normal sinus rhythm actually is atrially paced.  Normal QT interval 442 corrected.  Finally eating also to convince him to stop taking Plaquenil.  Interestingly he does have multiple pain in joints and he was getting narcotic for it but when he stopped taking Plaquenil those pains are gone and that is why he is so happy and he is so much pushing towards continuation of those medications.  I think he may be having some rheumatological problems and I want him to be referred to rheumatology.  Past Medical History:  Diagnosis Date  . Anemia   . Chronic airway obstruction, not elsewhere classified   . Chronic ischemic heart disease, unspecified   . Coronary artery disease   . Diabetes mellitus    type II, neuropathy,   . Essential hypertension, benign   . Hyperlipidemia, mixed   . Nodule of kidney    incidentally found on CT abdomen 11/2012.  Pending Urology evaluation.   . Nontoxic multinodular goiter   . Obesity   . Other testicular hypofunction   . PUD (peptic ulcer disease)   . Renal insufficiency, mild    hx of  . Sleep apnea    uses cpap    Past Surgical History:  Procedure Laterality Date  . ATRIAL FIBRILLATION ABLATION N/A 02/05/2019   Procedure: ATRIAL FIBRILLATION ABLATION;  Surgeon: Constance Haw, MD;  Location: [place] [facility];  Service:  Cardiovascular;  Laterality: N/A;  . CHOLECYSTECTOMY    . COLONOSCOPY  05/11/2015   Colonic polyps statuts post polypectomy. Moderate predominalty sigmoid diverticulosis. Internal hemorrhoids. Tubular adenoma.   . CORONARY ARTERY BYPASS GRAFT  2007   x 4  . ESOPHAGOGASTRODUODENOSCOPY     10/06/2012:  hiatal hernia, moderate gastritis.  2012: Colonoscopy at [facility]:  one polyp.  Due next 2017  . EYE SURGERY    . KNEE DEBRIDEMENT    . LAPAROTOMY N/A 07/21/2018   Procedure: EXPLORATORY LAPAROTOMY WITH SMALL BOWEL RESECTION;  Surgeon: Erroll Luna, MD;  Location: [place];  Service: General;  Laterality: N/A;  . PACEMAKER IMPLANT N/A 08/22/2017   Procedure: Pacemaker Implant;  Surgeon: Constance Haw, MD;  Location: [place] [facility];  Service: Cardiovascular;  Laterality: N/A;  . SHOULDER SURGERY     left shoulder  . TONSILECTOMY, ADENOIDECTOMY, BILATERAL MYRINGOTOMY AND TUBES      Current Medications: No outpatient medications have been marked as taking for the 05/11/19 encounter (Office Visit) with Park Liter, MD.     Allergies:   Ciprofloxacin; Sulfamethoxazole-trimethoprim; Lisinopril; and Naproxen   Social History   Socioeconomic History  . Marital status: Married    Spouse name: Not on file  . Number of children: 5  . Years of education: Not on  file  . Highest education level: Not on file  Occupational History  . Occupation: Retired    Comment: Medical illustrator; retired Chiropodist  Social Needs  . Financial resource strain: Not on file  . Food insecurity:    Worry: Not on file    Inability: Not on file  . Transportation needs:    Medical: Not on file    Non-medical: Not on file  Tobacco Use  . Smoking status: Former Smoker    Packs/day: 1.50    Years: 30.00    Pack years: 45.00    Types: Cigarettes    Last attempt to quit: 2006    Years since quitting: 14.3  . Smokeless tobacco: Former Systems developer    Types: Chew  Substance and Sexual Activity  .  Alcohol use: Yes    Comment: rare  . Drug use: No  . Sexual activity: Not on file  Lifestyle  . Physical activity:    Days per week: Not on file    Minutes per session: Not on file  . Stress: Not on file  Relationships  . Social connections:    Talks on phone: Not on file    Gets together: Not on file    Attends religious service: Not on file    Active member of club or organization: Not on file    Attends meetings of clubs or organizations: Not on file    Relationship status: Not on file  Other Topics Concern  . Not on file  Social History Narrative   ** Merged History Encounter **       Lives with wife in a one story home.  Has 6 children.  Retired Nature conservation officer.  Education: college.     Family History: The patient's family history includes Asthma in his sister; Cancer in his father and mother; Colon cancer in his mother; Emphysema in an other family member; Esophageal cancer in his father; Hypertension in his mother; Kidney cancer in his mother; Stroke in his father. ROS:   Please see the history of present illness.    All 14 point review of systems negative except as described per history of present illness  EKGs/Labs/Other Studies Reviewed:      Recent Labs: 05/16/2018: NT-Pro BNP 3,272 07/21/2018: ALT 33 07/31/2018: Magnesium 1.8 01/27/2019: Hemoglobin 11.7; Platelets 349 02/02/2019: BUN 25; Creatinine, Ser 1.20; Potassium 4.9; Sodium 138  Recent Lipid Panel No results found for: CHOL, TRIG, HDL, CHOLHDL, VLDL, LDLCALC, LDLDIRECT  Physical Exam:    VS:  There were no vitals taken for this visit.    Wt Readings from Last 3 Encounters:  04/27/19 194 lb (88 kg)  02/05/19 195 lb (88.5 kg)  02/02/19 200 lb (90.7 kg)     GEN:  Well nourished, well developed in no acute distress HEENT: Normal NECK: No JVD; No carotid bruits LYMPHATICS: No lymphadenopathy CARDIAC: RRR, no murmurs, no rubs, no gallops RESPIRATORY:  Clear to auscultation without rales, wheezing or rhonchi   ABDOMEN: Soft, non-tender, non-distended MUSCULOSKELETAL:  No edema; No deformity  SKIN: Warm and dry LOWER EXTREMITIES: no swelling NEUROLOGIC:  Alert and oriented x 3 PSYCHIATRIC:  Normal affect   ASSESSMENT:    1. Paroxysmal atrial fibrillation (HCC)   2. Coronary artery disease involving native coronary artery of native heart without angina pectoris   3. Pacemaker    PLAN:    In order of problems listed above:  1. Paroxysmal atrial fibrillation sinus rhythm today.  Continue present management. 2. 2.  Coronary disease stable. 3. 3.  Pacemaker followed by our team. 4. 4.  Plaquenil use I advised him against this likely his EKG did not show any prolonged history of QT interval.  He also got Chem-7 as well as proBNP done today   Medication Adjustments/Labs and Tests Ordered: Current medicines are reviewed at length with the patient today.  Concerns regarding medicines are outlined above.  No orders of the defined types were placed in this encounter.  Medication changes: No orders of the defined types were placed in this encounter.   Signed, Park Liter, MD, [facility] 05/11/2019 2:20 PM    [place]

## 2019-05-11 NOTE — Patient Instructions (Signed)
Medication Instructions:  Your physician recommends that you continue on your current medications as directed. Please refer to the Current Medication list given to you today.  If you need a refill on your cardiac medications before your next appointment, please call your pharmacy.   Lab work: None.  If you have labs (blood work) drawn today and your tests are completely normal, you will receive your results only by: Marland Kitchen MyChart Message (if you have MyChart) OR . A paper copy in the mail If you have any lab test that is abnormal or we need to change your treatment, we will call you to review the results.  Testing/Procedures: None.   Follow-Up: Follow up as previously advised.  Any Other Special Instructions Will Be Listed Below (If Applicable).  Dr. [name] has referred you to a rheumatologist, their office should call you within 1 week.

## 2019-05-12 ENCOUNTER — Encounter: Payer: Self-pay | Admitting: Cardiology

## 2019-05-12 LAB — BASIC METABOLIC PANEL
BUN/Creatinine Ratio: 22 (ref 10–24)
BUN: 29 mg/dL — ABNORMAL HIGH (ref 8–27)
CO2: 21 mmol/L (ref 20–29)
Calcium: 9.5 mg/dL (ref 8.6–10.2)
Chloride: 105 mmol/L (ref 96–106)
Creatinine, Ser: 1.3 mg/dL — ABNORMAL HIGH (ref 0.76–1.27)
GFR calc Af Amer: 63 mL/min/{1.73_m2} (ref >59)
GFR calc non Af Amer: 54 mL/min/{1.73_m2} — ABNORMAL LOW (ref >59)
Glucose: 141 mg/dL — ABNORMAL HIGH (ref 65–99)
Potassium: 5 mmol/L (ref 3.5–5.2)
Sodium: 140 mmol/L (ref 134–144)

## 2019-05-12 LAB — PRO B NATRIURETIC PEPTIDE: NT-Pro BNP: 1913 pg/mL — ABNORMAL HIGH (ref 0–376)

## 2019-05-13 ENCOUNTER — Telehealth: Payer: Self-pay | Admitting: Cardiology

## 2019-05-13 NOTE — Telephone Encounter (Signed)
His pulse is really eratic today

## 2019-05-13 NOTE — Telephone Encounter (Signed)
Dr. [name] aware. He advised for patient to continue monitoring this and let us know if it returns. Patient verbally understands. No further questions.

## 2019-05-13 NOTE — Telephone Encounter (Signed)
Called patient. He reports he stated feeing his pulse beat very fast in his neck. At the time his blood pressure was 146/86 and heart rate 131, he denies the feeling now and blood pressure while on the phone is 146/72 and heart rate 60. Patient informed to continue monitoring this if the feeling returns and informed we will consult with Dr. [name] and follow back up with the patient. He verbally understands.

## 2019-05-19 ENCOUNTER — Telehealth: Payer: Self-pay | Admitting: Cardiology

## 2019-05-19 NOTE — Telephone Encounter (Signed)
Called Phyllis to let her that [facility] called Dr. Earnest Conroy this morning, so they should be contacted soon.

## 2019-05-19 NOTE — Telephone Encounter (Signed)
Patient wife called and she has not heard from the specialist office.. patient in a lot of pain.Marland Kitchen

## 2019-05-26 DIAGNOSIS — E1140 Type 2 diabetes mellitus with diabetic neuropathy, unspecified: Secondary | ICD-10-CM | POA: Diagnosis not present

## 2019-05-26 DIAGNOSIS — M159 Polyosteoarthritis, unspecified: Secondary | ICD-10-CM

## 2019-05-26 DIAGNOSIS — M150 Primary generalized (osteo)arthritis: Secondary | ICD-10-CM

## 2019-05-26 DIAGNOSIS — Z794 Long term (current) use of insulin: Secondary | ICD-10-CM | POA: Diagnosis not present

## 2019-05-26 DIAGNOSIS — I2510 Atherosclerotic heart disease of native coronary artery without angina pectoris: Secondary | ICD-10-CM

## 2019-05-26 DIAGNOSIS — M8949 Other hypertrophic osteoarthropathy, multiple sites: Secondary | ICD-10-CM

## 2019-05-26 DIAGNOSIS — E11618 Type 2 diabetes mellitus with other diabetic arthropathy: Secondary | ICD-10-CM

## 2019-05-26 HISTORY — DX: Other hypertrophic osteoarthropathy, multiple sites: M89.49

## 2019-05-26 HISTORY — DX: Type 2 diabetes mellitus with other diabetic arthropathy: E11.618

## 2019-05-26 HISTORY — DX: Primary generalized (osteo)arthritis: M15.0

## 2019-05-26 HISTORY — DX: Polyosteoarthritis, unspecified: M15.9

## 2019-05-26 HISTORY — DX: Atherosclerotic heart disease of native coronary artery without angina pectoris: I25.10

## 2019-05-27 ENCOUNTER — Ambulatory Visit (INDEPENDENT_AMBULATORY_CARE_PROVIDER_SITE_OTHER): Payer: Medicare Other | Admitting: *Deleted

## 2019-05-27 DIAGNOSIS — I495 Sick sinus syndrome: Secondary | ICD-10-CM | POA: Diagnosis not present

## 2019-05-27 LAB — CUP PACEART REMOTE DEVICE CHECK
Battery Remaining Longevity: 115 mo
Battery Remaining Percentage: 95.5 %
Battery Voltage: 2.99 V
Brady Statistic AP VP Percent: 1.7 %
Brady Statistic AP VS Percent: 68 %
Brady Statistic AS VP Percent: 1 %
Brady Statistic AS VS Percent: 30 %
Brady Statistic RA Percent Paced: 31 %
Brady Statistic RV Percent Paced: 12 %
Date Time Interrogation Session: 20200603060014
Implantable Lead Implant Date: 20180830
Implantable Lead Implant Date: 20180830
Implantable Lead Location: 753859
Implantable Lead Location: 753860
Implantable Pulse Generator Implant Date: 20180830
Lead Channel Impedance Value: 410 Ohm
Lead Channel Impedance Value: 450 Ohm
Lead Channel Pacing Threshold Amplitude: 0.625 V
Lead Channel Pacing Threshold Amplitude: 0.75 V
Lead Channel Pacing Threshold Pulse Width: 0.5 ms
Lead Channel Pacing Threshold Pulse Width: 0.5 ms
Lead Channel Sensing Intrinsic Amplitude: 1.9 mV
Lead Channel Sensing Intrinsic Amplitude: 9.3 mV
Lead Channel Setting Pacing Amplitude: 1.625
Lead Channel Setting Pacing Amplitude: 2.5 V
Lead Channel Setting Pacing Pulse Width: 0.5 ms
Lead Channel Setting Sensing Sensitivity: 2 mV
Pulse Gen Model: 2272
Pulse Gen Serial Number: 8939815

## 2019-06-04 ENCOUNTER — Encounter: Payer: Self-pay | Admitting: Cardiology

## 2019-06-04 NOTE — Progress Notes (Signed)
Remote pacemaker transmission.   

## 2019-06-05 ENCOUNTER — Telehealth: Payer: Self-pay | Admitting: *Deleted

## 2019-06-05 DIAGNOSIS — D500 Iron deficiency anemia secondary to blood loss (chronic): Secondary | ICD-10-CM | POA: Diagnosis not present

## 2019-06-05 DIAGNOSIS — D509 Iron deficiency anemia, unspecified: Secondary | ICD-10-CM | POA: Diagnosis not present

## 2019-06-05 DIAGNOSIS — K922 Gastrointestinal hemorrhage, unspecified: Secondary | ICD-10-CM | POA: Diagnosis not present

## 2019-06-05 MED ORDER — METOPROLOL TARTRATE 100 MG PO TABS: 100.0000 mg | ORAL_TABLET | Freq: Two times a day (BID) | ORAL | 3 refills | Status: DC

## 2019-06-05 NOTE — Telephone Encounter (Signed)
DPR on file ok to s/w pt's wife who has been notified of remote device results and recommendations. Advised per Dr. Curt Bears to increase Metoprolol Tart to 100 mg BID, new Rx has been sent to [facility] per Wife instructions. Wife states she will start the increase beginning tonight.. Med list has been changed today to reflect the increase in Metoprolol Tart. Pt's wife thanked me for the call. I did explain the increase of the Metoprolol was due to AF burden 55%.

## 2019-06-05 NOTE — Telephone Encounter (Signed)
-----   Message from Will Meredith Leeds, MD sent at 05/28/2019  9:55 AM EDT ----- Abnormal device interrogation reviewed.  Lead parameters and battery status stable.  AF and HVR events appear SVT. Increase metoprolol to 100 mg BID.

## 2019-06-29 IMAGING — DX DG LUMBAR SPINE COMPLETE 4+V
5 series · 5 of 5 positions shown · non-contrast
Comparison: CT scan of July 21, 2018.

CLINICAL DATA: Lower back pain.

EXAM:
LUMBAR SPINE - COMPLETE 4+ VIEW

[l-spine ap]
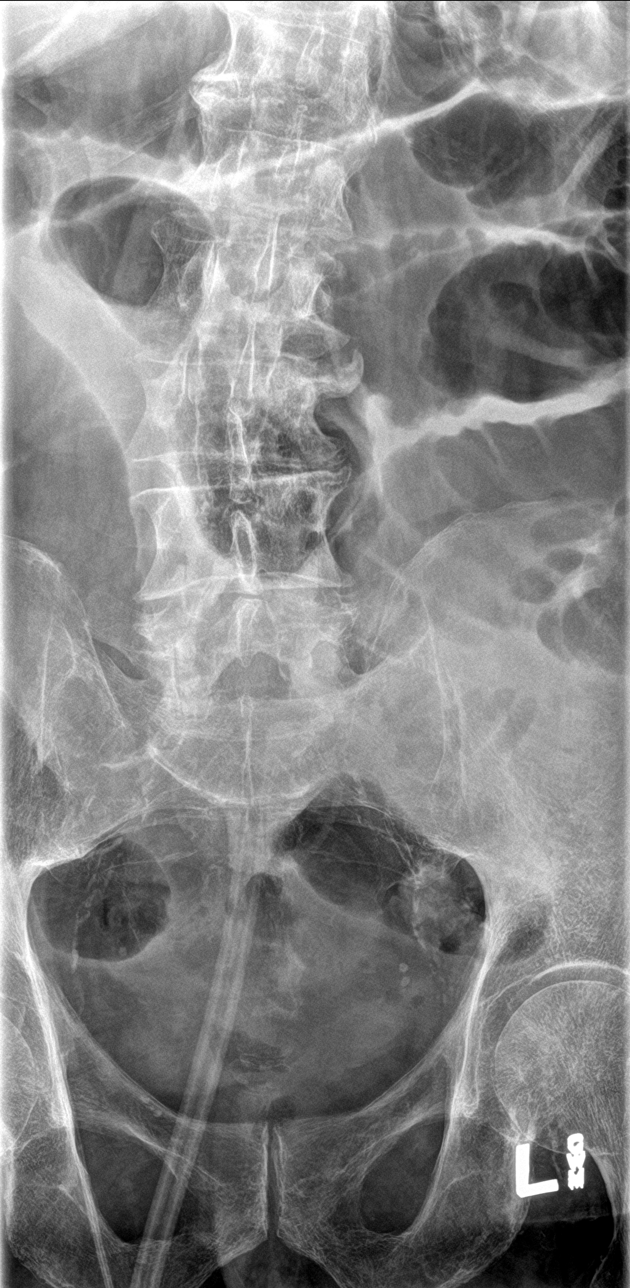

[l-spine obl (1 of 2)]
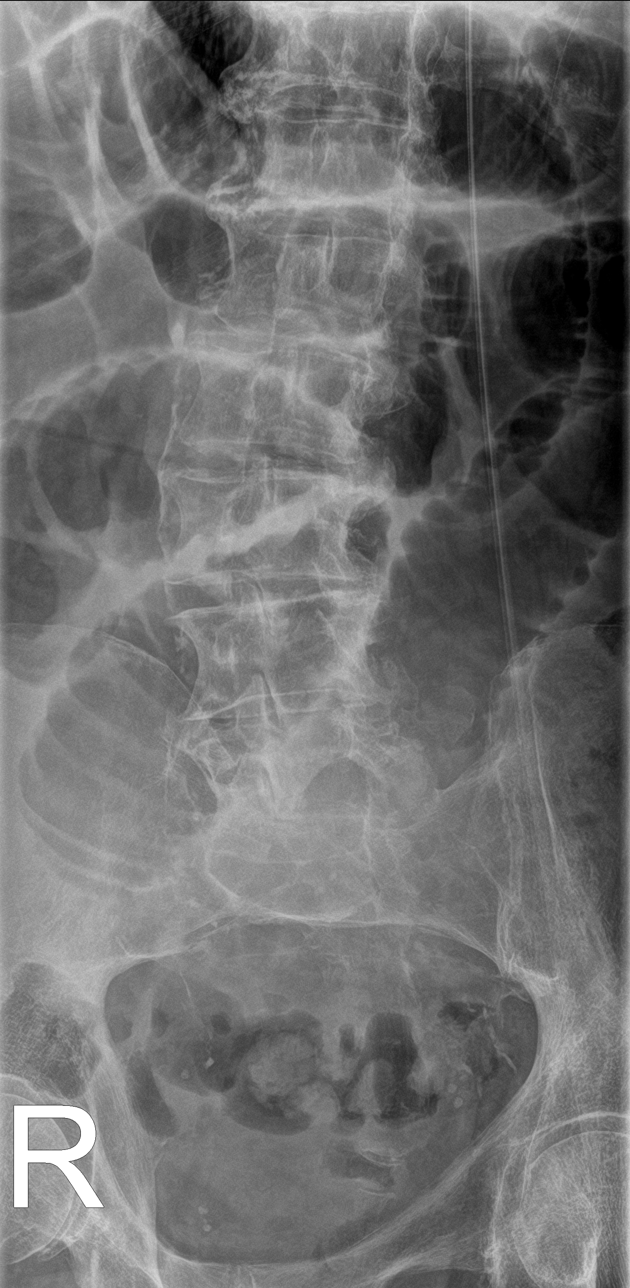

[l-spine obl (2 of 2)]
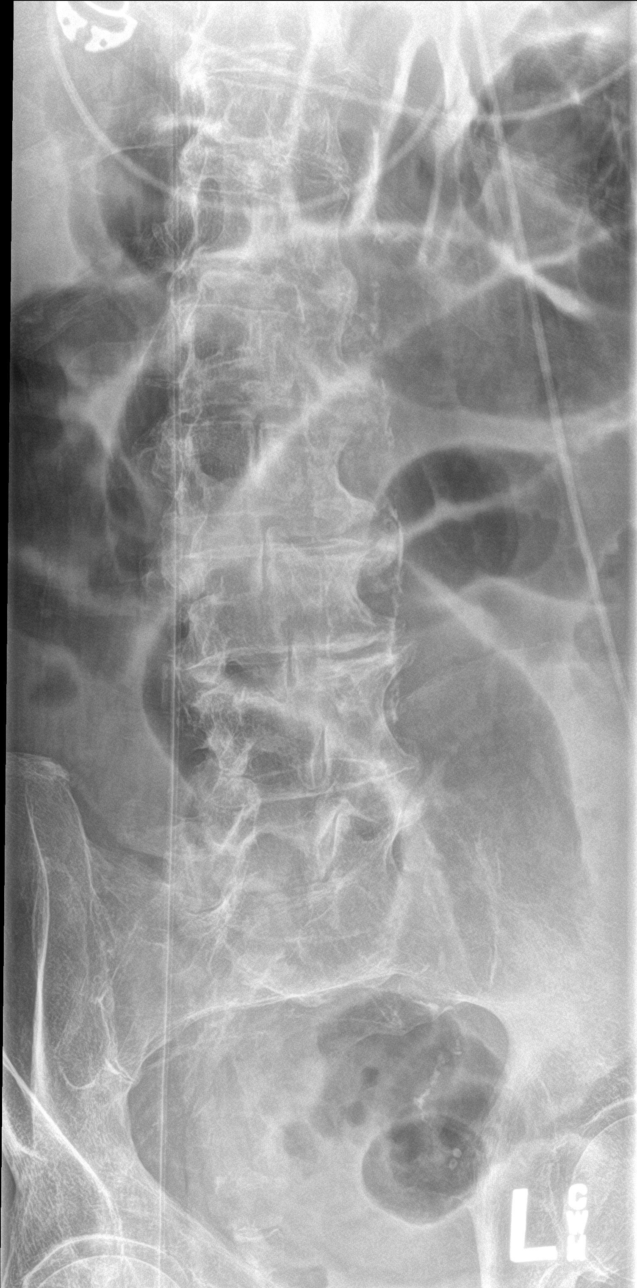

[l-spine lat]
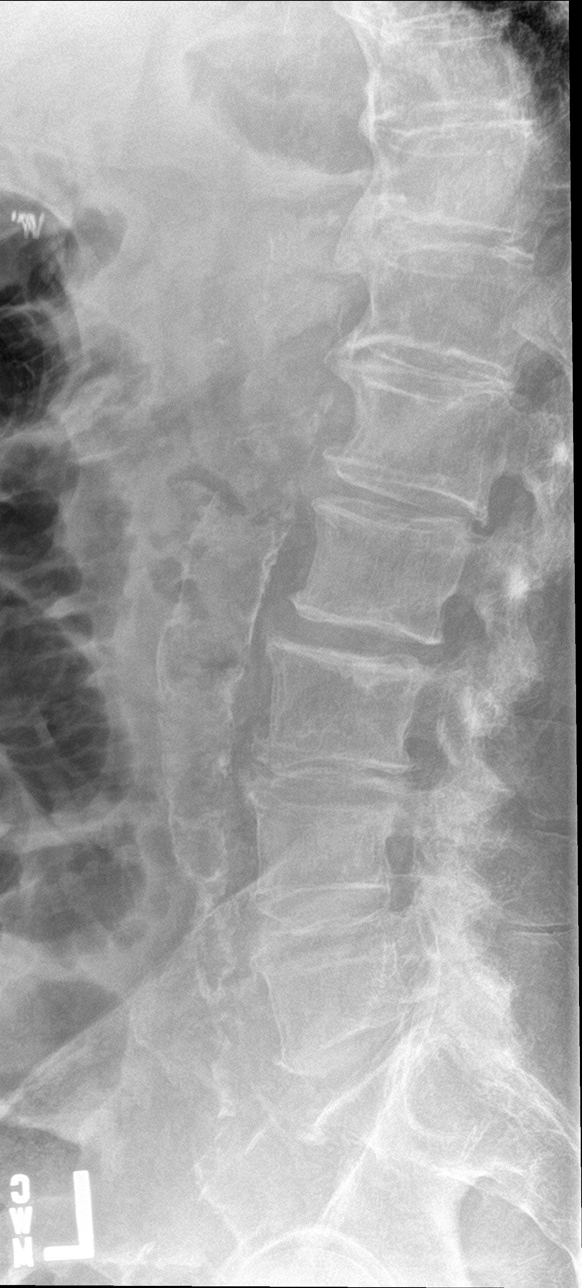

[l-spine spot]
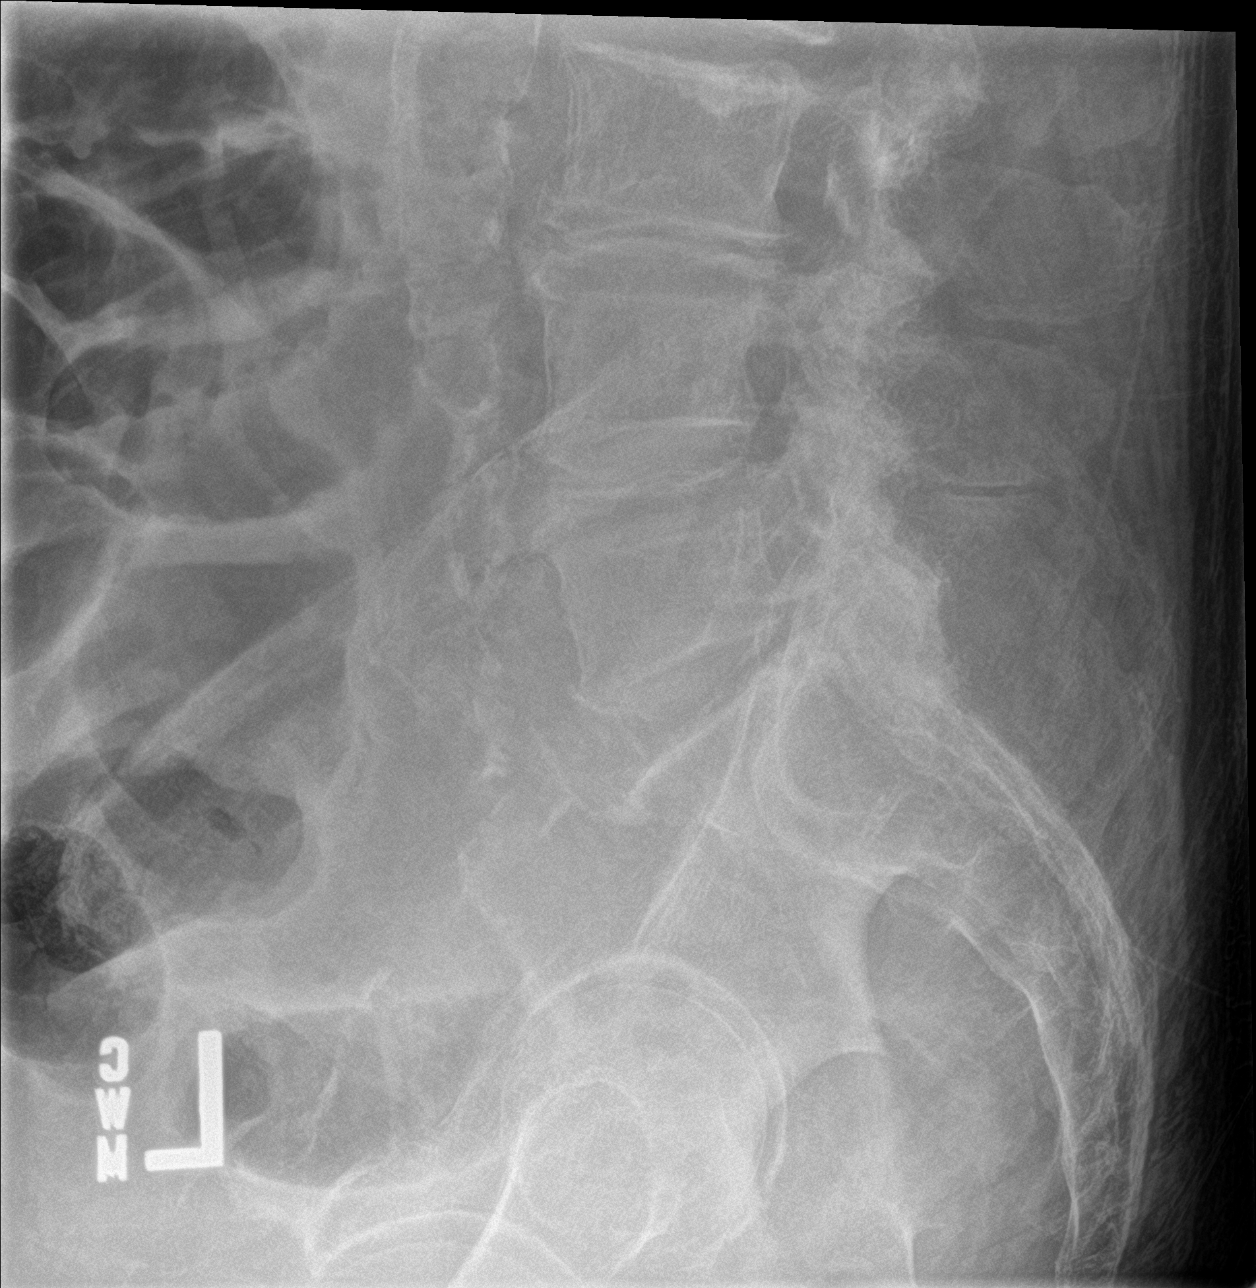

[5 of 5 positions shown; findings below may reference images not displayed]

FINDINGS: No fracture or spondylolisthesis is noted. Mild degenerative disc
disease is noted at L1-2, L2-3 and L4-5. Moderate degenerative disc
disease is noted at L3-4. Atherosclerosis of abdominal aorta is
noted.
IMPRESSION: Multilevel degenerative disc disease. No acute abnormality seen in
the lumbar spine.

Aortic Atherosclerosis ([id number]).

## 2019-07-01 IMAGING — DX DG ABDOMEN 1V
2 series · 2 of 2 positions shown · non-contrast
Comparison: 07/25/2018

CLINICAL DATA: Ileus

EXAM:
ABDOMEN - 1 VIEW

[abdomen kub (1 of 2)]
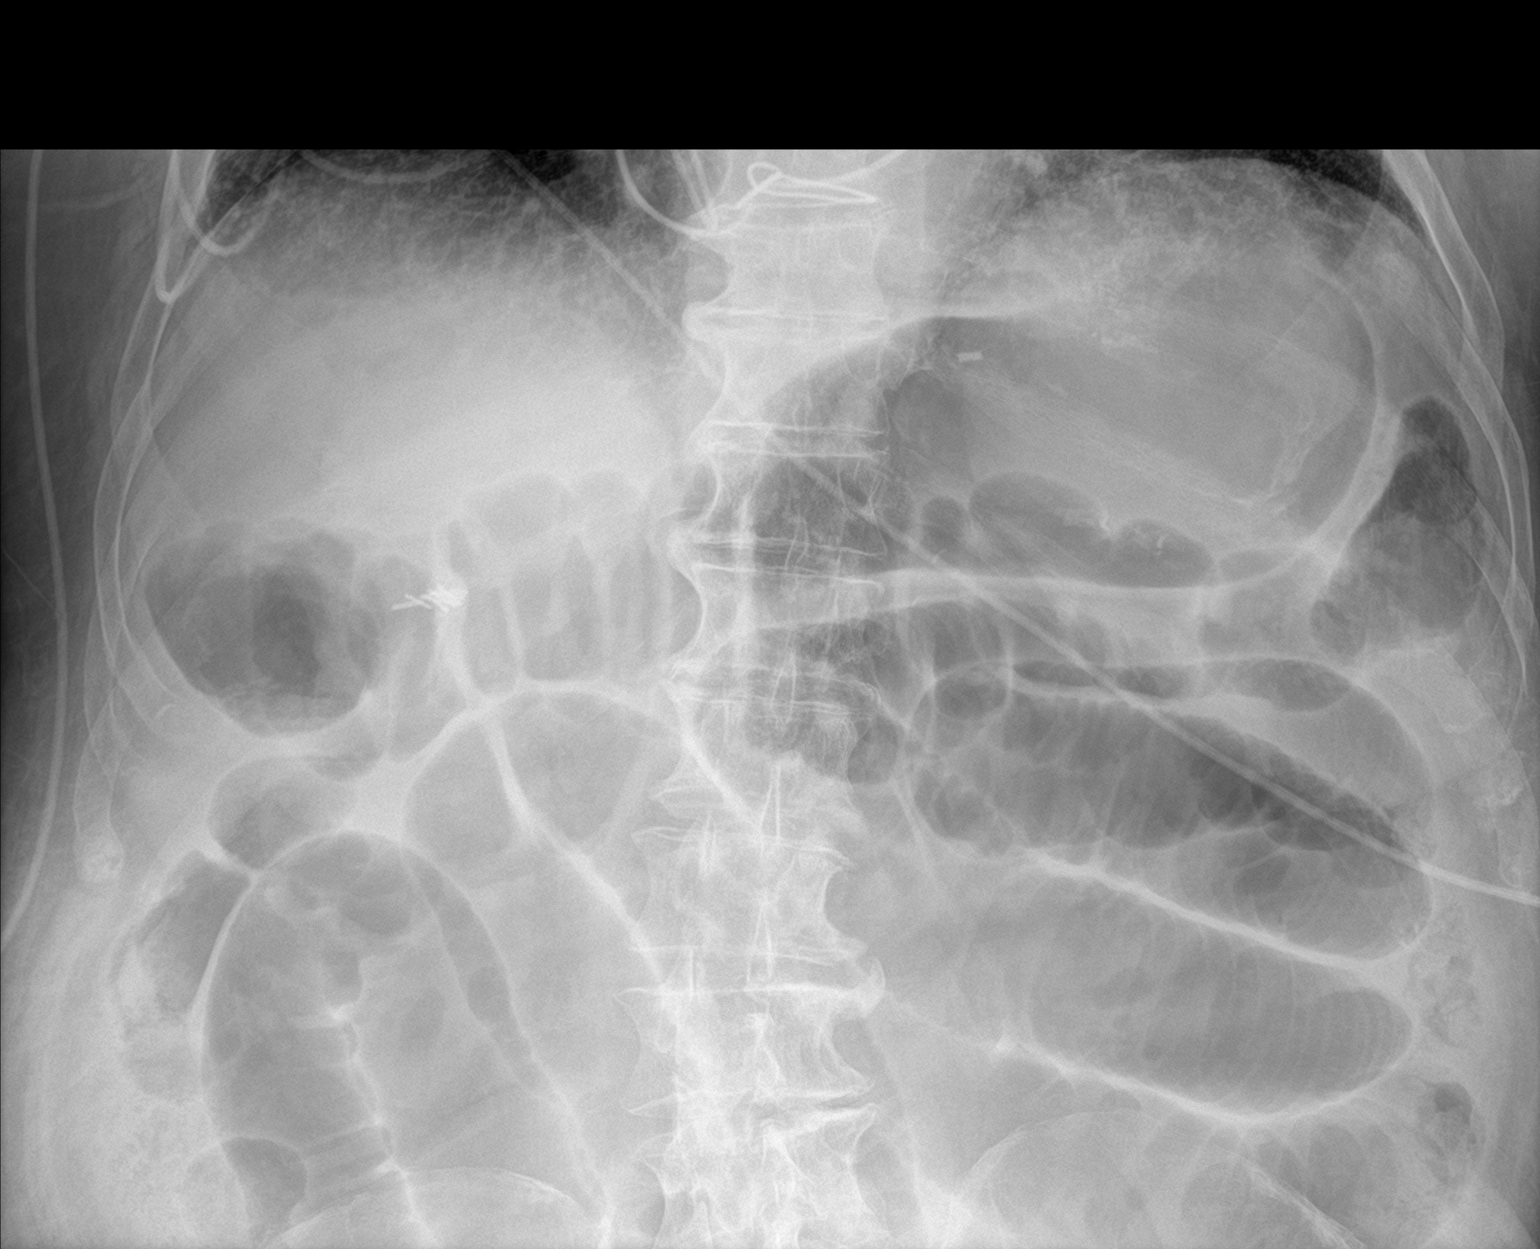

[abdomen kub (2 of 2)]
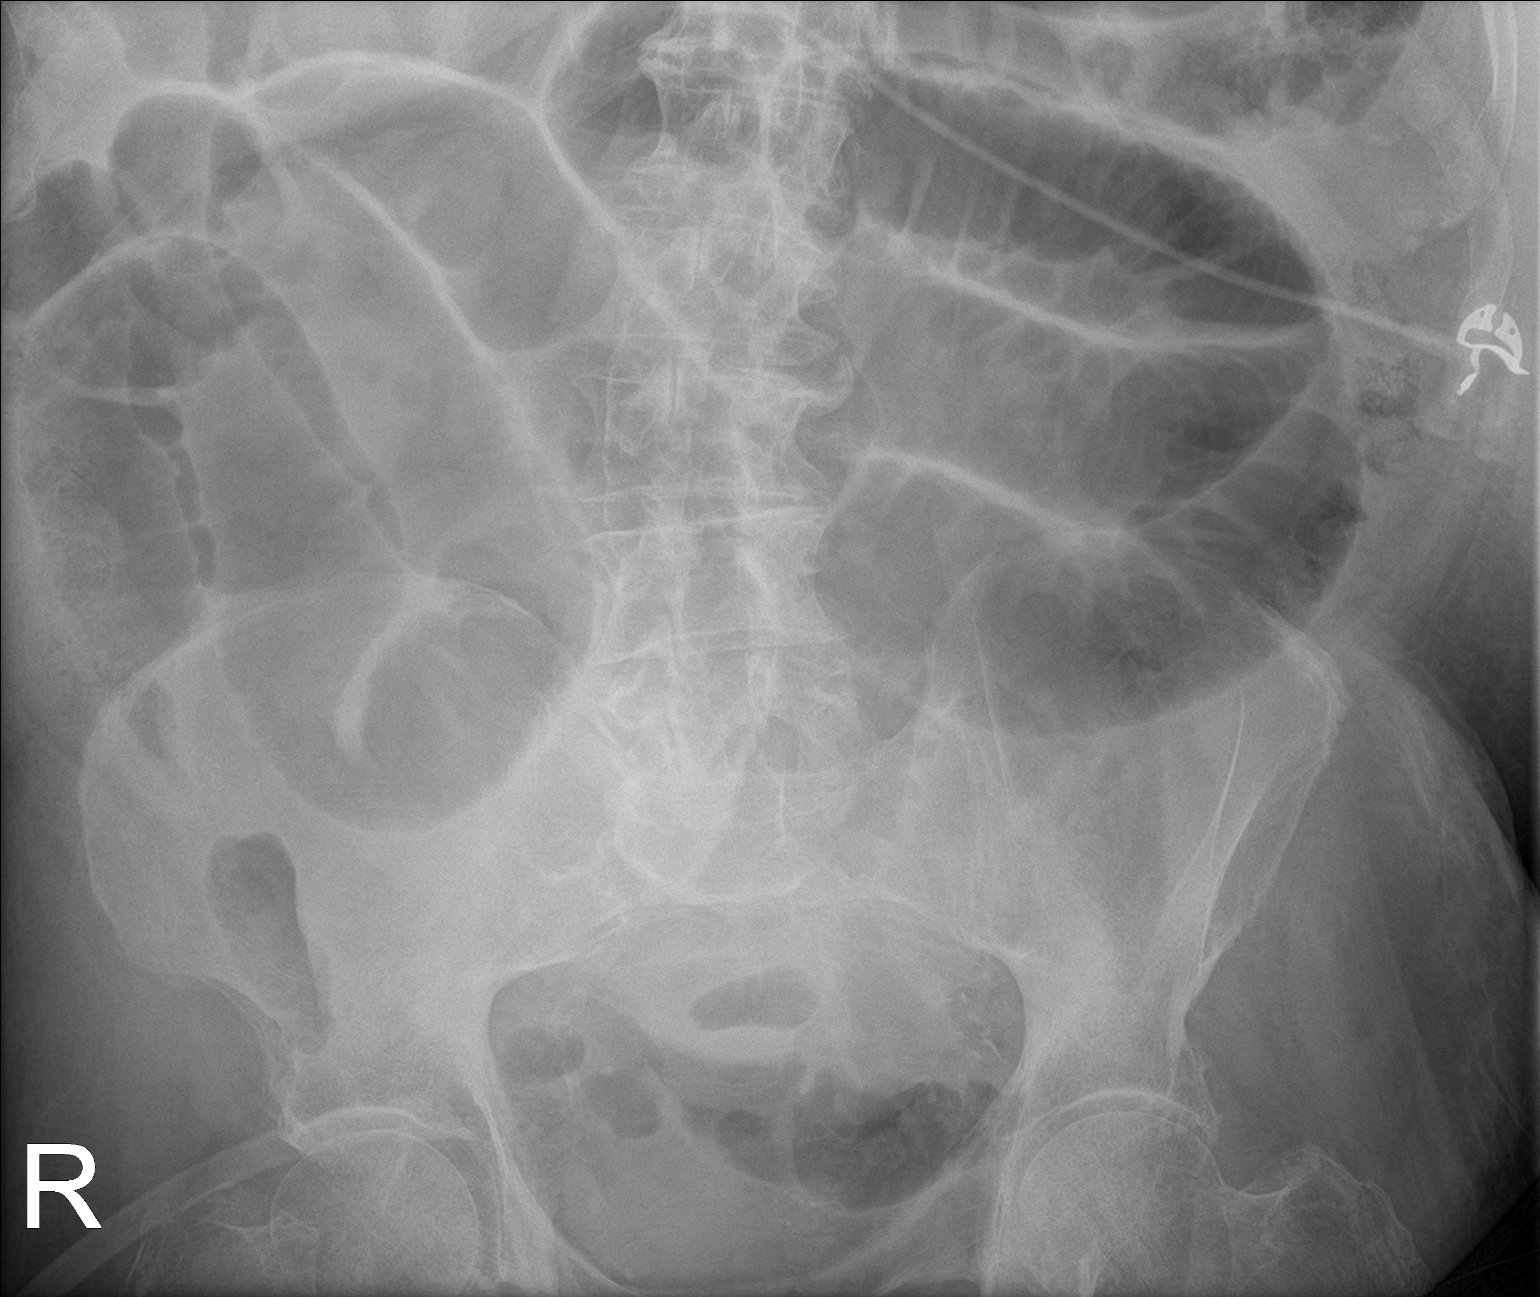

[2 of 2 positions shown; findings below may reference images not displayed]

FINDINGS: Scattered large and small bowel gas is noted. Persistent small bowel
dilatation is identified. Some colonic gas is seen in these changes
may represent a partial small bowel obstruction. Degenerative
changes of the lumbar spine are noted. No new focal abnormality is
seen.
IMPRESSION: Diffuse small-bowel dilatation likely related to a partial small
bowel obstruction. Clinical correlation is recommended.

## 2019-07-02 IMAGING — DX DG ABD PORTABLE 1V
1 series · 1 of 1 positions shown · non-contrast
Comparison: July 27, 2018

CLINICAL DATA: Nasogastric tube placement. Recent partial bowel
resection

EXAM:
PORTABLE ABDOMEN - 1 VIEW

[abdomen]
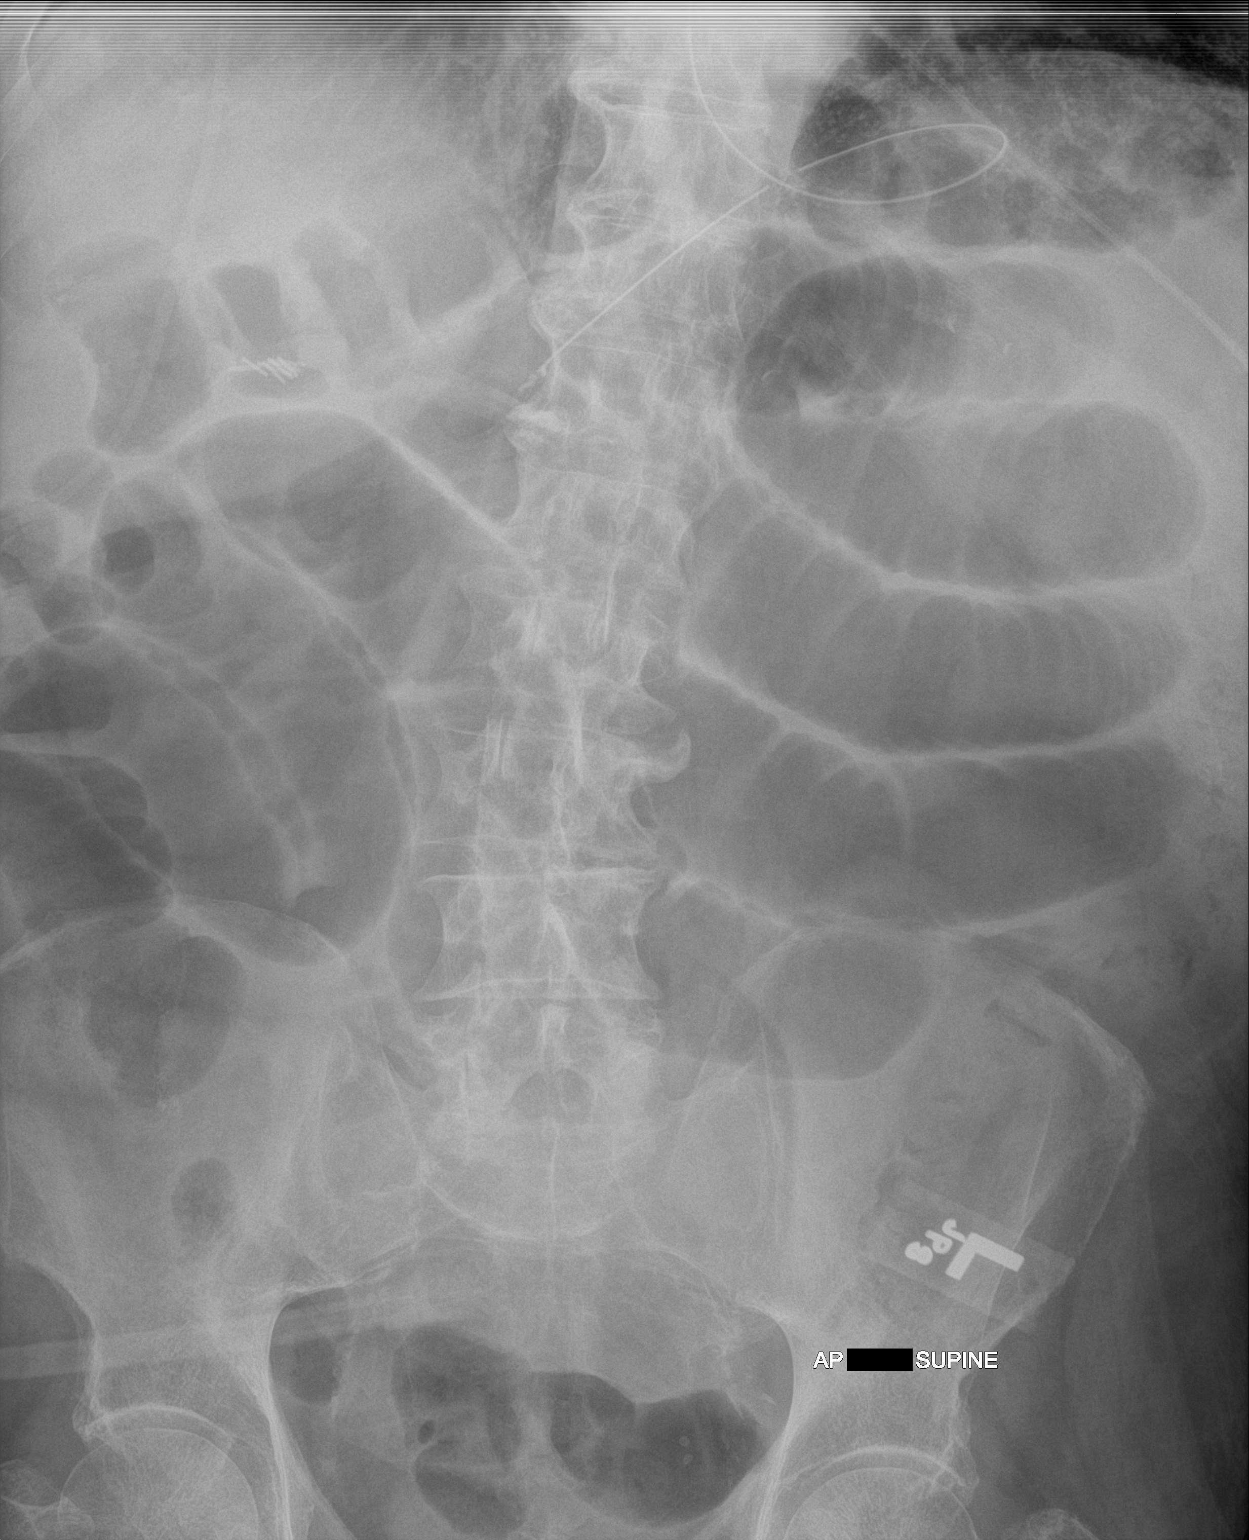

[1 of 1 positions shown; findings below may reference images not displayed]

FINDINGS: Nasogastric tube tip and side port are in the stomach. There remain
multiple loops of dilated small bowel without appreciable air-fluid
levels. Air is seen and portions of the colon. No appreciable free
air.
IMPRESSION: Nasogastric tube tip and side port in stomach. Diffuse bowel
dilatation remains. A degree of bowel obstruction cannot be
excluded, although postoperative ileus may present in this manner.
No free air demonstrable on this supine examination.

## 2019-07-03 IMAGING — DX DG CHEST 1V PORT
1 series · 1 of 1 positions shown · non-contrast
Comparison: 07/22/2018.

CLINICAL DATA: Leukocytosis.

EXAM:
PORTABLE CHEST 1 VIEW

[chest]
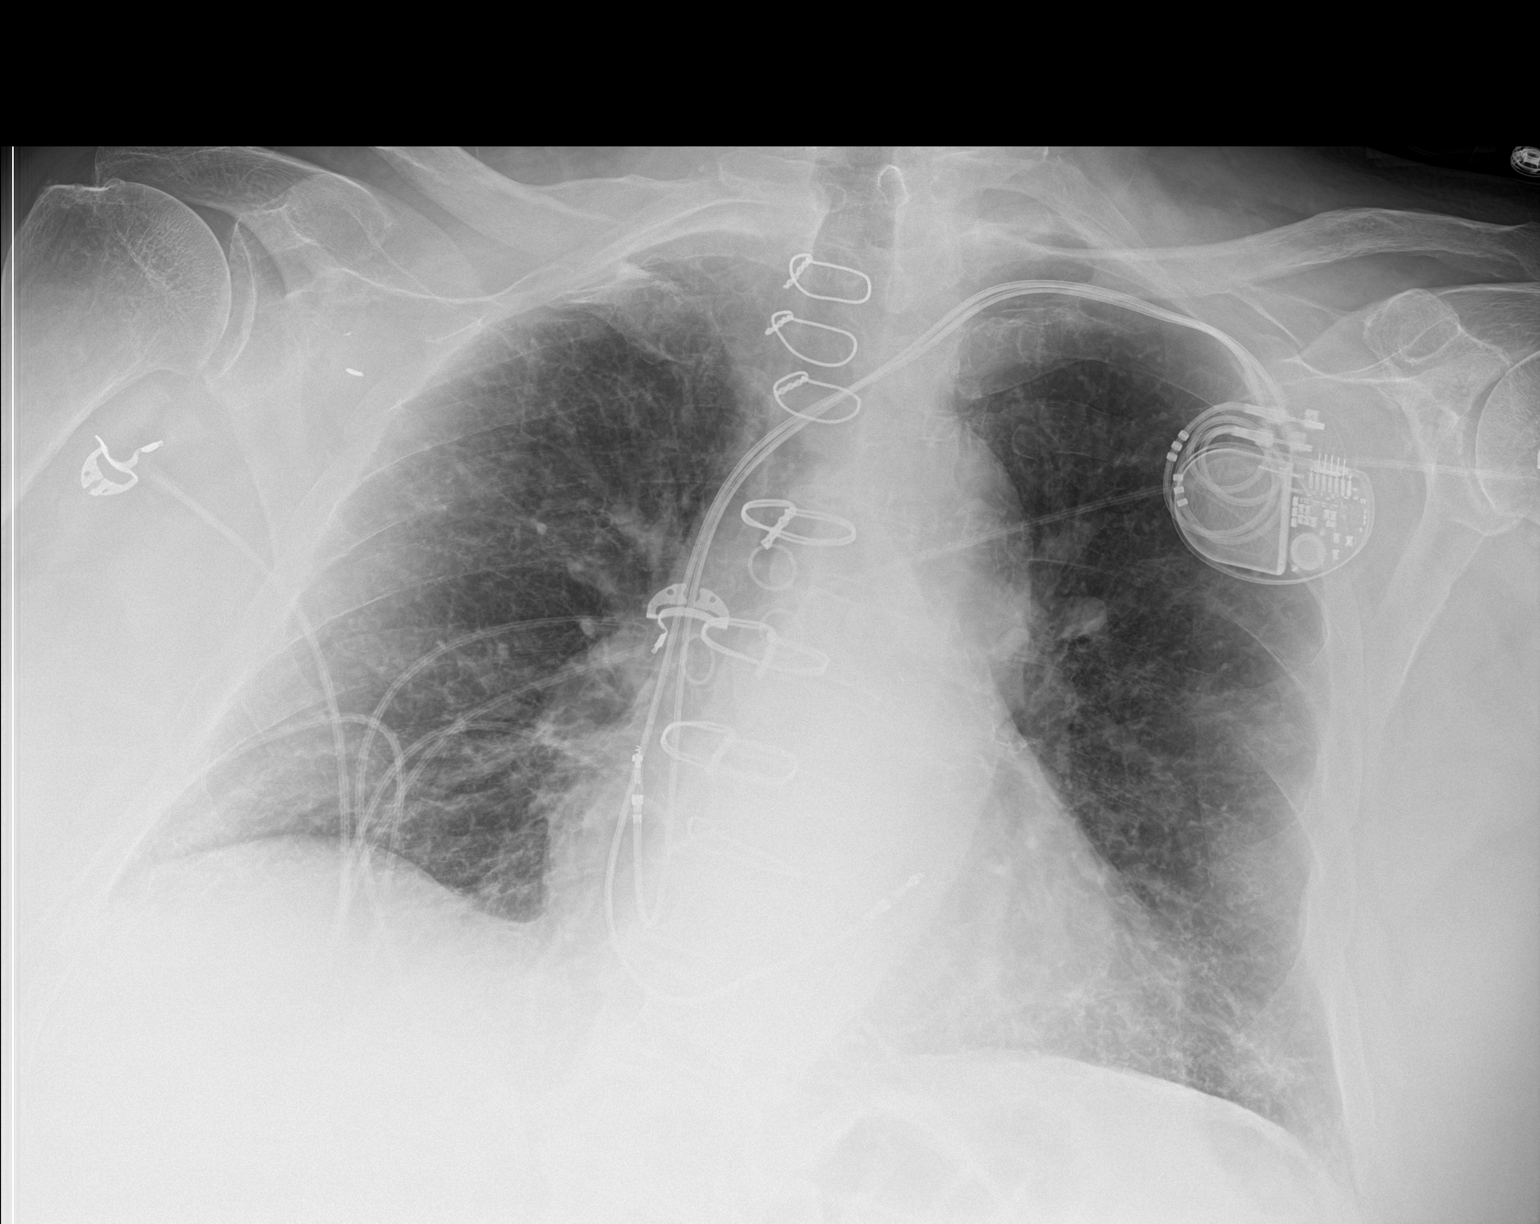

[1 of 1 positions shown; findings below may reference images not displayed]

FINDINGS: Cardiac pacer with lead tip over the right atrium right ventricle.
Prior CABG. Cardiomegaly with normal pulmonary vascularity. Low lung
volumes with mild bibasilar atelectasis. No pleural effusion or
pneumothorax. Small metallic densities noted over the right
shoulder, possibly postsurgical.
IMPRESSION: 1. Prior CABG. Cardiac pacer noted with lead tips projected over the
right atrium right ventricle. Mild cardiomegaly. No pulmonary venous
congestion.

2.  No acute pulmonary abnormality.

## 2019-08-17 DIAGNOSIS — I509 Heart failure, unspecified: Secondary | ICD-10-CM | POA: Diagnosis not present

## 2019-08-17 DIAGNOSIS — N183 Chronic kidney disease, stage 3 (moderate): Secondary | ICD-10-CM | POA: Diagnosis not present

## 2019-08-17 DIAGNOSIS — I129 Hypertensive chronic kidney disease with stage 1 through stage 4 chronic kidney disease, or unspecified chronic kidney disease: Secondary | ICD-10-CM | POA: Diagnosis not present

## 2019-08-17 DIAGNOSIS — Z7901 Long term (current) use of anticoagulants: Secondary | ICD-10-CM | POA: Diagnosis not present

## 2019-08-17 DIAGNOSIS — D631 Anemia in chronic kidney disease: Secondary | ICD-10-CM | POA: Diagnosis not present

## 2019-08-17 DIAGNOSIS — Z950 Presence of cardiac pacemaker: Secondary | ICD-10-CM | POA: Diagnosis not present

## 2019-08-17 DIAGNOSIS — E559 Vitamin D deficiency, unspecified: Secondary | ICD-10-CM | POA: Diagnosis not present

## 2019-08-17 DIAGNOSIS — I480 Paroxysmal atrial fibrillation: Secondary | ICD-10-CM | POA: Diagnosis not present

## 2019-08-17 DIAGNOSIS — N189 Chronic kidney disease, unspecified: Secondary | ICD-10-CM | POA: Diagnosis not present

## 2019-08-17 DIAGNOSIS — E1122 Type 2 diabetes mellitus with diabetic chronic kidney disease: Secondary | ICD-10-CM | POA: Diagnosis not present

## 2019-08-17 DIAGNOSIS — N179 Acute kidney failure, unspecified: Secondary | ICD-10-CM | POA: Diagnosis not present

## 2019-08-17 DIAGNOSIS — K922 Gastrointestinal hemorrhage, unspecified: Secondary | ICD-10-CM | POA: Diagnosis not present

## 2019-08-24 ENCOUNTER — Telehealth: Payer: Medicare Other | Admitting: Cardiology

## 2019-08-26 ENCOUNTER — Ambulatory Visit (INDEPENDENT_AMBULATORY_CARE_PROVIDER_SITE_OTHER): Payer: Medicare Other | Admitting: *Deleted

## 2019-08-26 DIAGNOSIS — I480 Paroxysmal atrial fibrillation: Secondary | ICD-10-CM | POA: Diagnosis not present

## 2019-08-26 DIAGNOSIS — I5033 Acute on chronic diastolic (congestive) heart failure: Secondary | ICD-10-CM

## 2019-08-27 LAB — CUP PACEART REMOTE DEVICE CHECK
Battery Remaining Longevity: 111 mo
Battery Remaining Percentage: 95.5 %
Battery Voltage: 3.01 V
Brady Statistic AP VP Percent: 3.1 %
Brady Statistic AP VS Percent: 78 %
Brady Statistic AS VP Percent: 1 %
Brady Statistic AS VS Percent: 18 %
Brady Statistic RA Percent Paced: 48 %
Brady Statistic RV Percent Paced: 10 %
Date Time Interrogation Session: 20200902060015
Implantable Lead Implant Date: 20180830
Implantable Lead Implant Date: 20180830
Implantable Lead Location: 753859
Implantable Lead Location: 753860
Implantable Pulse Generator Implant Date: 20180830
Lead Channel Impedance Value: 390 Ohm
Lead Channel Impedance Value: 400 Ohm
Lead Channel Pacing Threshold Amplitude: 0.625 V
Lead Channel Pacing Threshold Amplitude: 0.75 V
Lead Channel Pacing Threshold Pulse Width: 0.5 ms
Lead Channel Pacing Threshold Pulse Width: 0.5 ms
Lead Channel Sensing Intrinsic Amplitude: 10.2 mV
Lead Channel Sensing Intrinsic Amplitude: 2.1 mV
Lead Channel Setting Pacing Amplitude: 1.625
Lead Channel Setting Pacing Amplitude: 2.5 V
Lead Channel Setting Pacing Pulse Width: 0.5 ms
Lead Channel Setting Sensing Sensitivity: 2 mV
Pulse Gen Model: 2272
Pulse Gen Serial Number: 8939815

## 2019-09-01 ENCOUNTER — Other Ambulatory Visit: Payer: Self-pay

## 2019-09-01 ENCOUNTER — Encounter: Payer: Self-pay | Admitting: Cardiology

## 2019-09-01 ENCOUNTER — Telehealth (INDEPENDENT_AMBULATORY_CARE_PROVIDER_SITE_OTHER): Payer: Medicare Other | Admitting: Cardiology

## 2019-09-01 VITALS — BP 150/64 | HR 64 | Temp 98.0°F | Wt 195.0 lb

## 2019-09-01 DIAGNOSIS — I480 Paroxysmal atrial fibrillation: Secondary | ICD-10-CM

## 2019-09-01 DIAGNOSIS — I2510 Atherosclerotic heart disease of native coronary artery without angina pectoris: Secondary | ICD-10-CM

## 2019-09-01 DIAGNOSIS — I5032 Chronic diastolic (congestive) heart failure: Secondary | ICD-10-CM

## 2019-09-01 DIAGNOSIS — I272 Pulmonary hypertension, unspecified: Secondary | ICD-10-CM

## 2019-09-01 DIAGNOSIS — I10 Essential (primary) hypertension: Secondary | ICD-10-CM

## 2019-09-01 NOTE — Patient Instructions (Signed)
Medication Instructions:  Your physician recommends that you continue on your current medications as directed. Please refer to the Current Medication list given to you today.  If you need a refill on your cardiac medications before your next appointment, please call your pharmacy.   Lab work: None.  If you have labs (blood work) drawn today and your tests are completely normal, you will receive your results only by: . MyChart Message (if you have MyChart) OR . A paper copy in the mail If you have any lab test that is abnormal or we need to change your treatment, we will call you to review the results.  Testing/Procedures: None.   Follow-Up: At [facility], you and your health needs are our priority.  As part of our continuing mission to provide you with exceptional heart care, we have created designated Provider Care Teams.  These Care Teams include your primary Cardiologist (physician) and Advanced Practice Providers (APPs -  Physician Assistants and Nurse Practitioners) who all work together to provide you with the care you need, when you need it. You will need a follow up appointment in 3 months.  Please call our office 2 months in advance to schedule this appointment.  You may see No primary care provider on file. or another member of our [facility] Provider Team in [place]: Brian Munley, MD . Rajan Revankar, MD  Any Other Special Instructions Will Be Listed Below (If Applicable).     

## 2019-09-01 NOTE — Progress Notes (Signed)
Virtual Visit via Telephone Note   This visit type was conducted due to national recommendations for restrictions regarding the COVID-19 Pandemic (e.g. social distancing) in an effort to limit this patient's exposure and mitigate transmission in our community.  Due to his co-morbid illnesses, this patient is at least at moderate risk for complications without adequate follow up.  This format is felt to be most appropriate for this patient at this time.  The patient did not have access to video technology/had technical difficulties with video requiring transitioning to audio format only (telephone).  All issues noted in this document were discussed and addressed.  No physical exam could be performed with this format.  Please refer to the patient's chart for his  consent to telehealth for [facility].  Evaluation Performed:  Follow-up visit  This visit type was conducted due to national recommendations for restrictions regarding the COVID-19 Pandemic (e.g. social distancing).  This format is felt to be most appropriate for this patient at this time.  All issues noted in this document were discussed and addressed.  No physical exam was performed (except for noted visual exam findings with Video Visits).  Please refer to the patient's chart (MyChart message for video visits and phone note for telephone visits) for the patient's consent to telehealth for [facility].  Date:  09/01/2019  ID: Lawrence [name], DOB February 21, 1946, MRN [id number]   Patient Location: [place] [address]   Provider location:   [place] Office  PCP:  [name], MD  Cardiologist:  [name], MD     Chief Complaint: Doing very well  History of Present Illness:    Lawrence [name] is a 73 y.o. male  who presents via audio/video conferencing for a telehealth visit today.  With coronary artery disease, essential hypertension, history of cardiomyopathy with normalization, diabetes,  pacemaker present.  He does have a visit with me today he is unable to establish video link therefore we only talk of the phone.  He denies having any chest pain tightness squeezing pressure burning chest and overall doing well in the matter-of-fact yesterday he cut the grass driving his tractor.  He walk around without cane overall he looks like he improved quite significantly.   The patient does not have symptoms concerning for COVID-19 infection (fever, chills, cough, or new SHORTNESS OF BREATH).    Prior CV studies:   The following studies were reviewed today:       Past Medical History:  Diagnosis Date  . Anemia   . Chronic airway obstruction, not elsewhere classified   . Chronic ischemic heart disease, unspecified   . Coronary artery disease   . Diabetes mellitus    type II, neuropathy,   . Essential hypertension, benign   . Hyperlipidemia, mixed   . Nodule of kidney    incidentally found on CT abdomen 11/2012.  Pending Urology evaluation.   . Nontoxic multinodular goiter   . Obesity   . Other testicular hypofunction   . PUD (peptic ulcer disease)   . Renal insufficiency, mild    hx of  . Sleep apnea    uses cpap    Past Surgical History:  Procedure Laterality Date  . ATRIAL FIBRILLATION ABLATION N/A 02/05/2019   Procedure: ATRIAL FIBRILLATION ABLATION;  Surgeon: Constance Haw, MD;  Location: [facility];  Service: Cardiovascular;  Laterality: N/A;  . CHOLECYSTECTOMY    . COLONOSCOPY  05/11/2015   Colonic  polyps statuts post polypectomy. Moderate predominalty sigmoid diverticulosis. Internal hemorrhoids. Tubular adenoma.   . CORONARY ARTERY BYPASS GRAFT  2007   x 4  . ESOPHAGOGASTRODUODENOSCOPY     10/06/2012:  hiatal hernia, moderate gastritis.  2012: Colonoscopy at [facility]:  one polyp.  Due next 2017  . EYE SURGERY    . KNEE DEBRIDEMENT    . LAPAROTOMY N/A 07/21/2018   Procedure: EXPLORATORY LAPAROTOMY WITH SMALL BOWEL RESECTION;  Surgeon: Erroll Luna, MD;  Location: [facility];  Service: General;  Laterality: N/A;  . PACEMAKER IMPLANT N/A 08/22/2017   Procedure: Pacemaker Implant;  Surgeon: Constance Haw, MD;  Location: [facility];  Service: Cardiovascular;  Laterality: N/A;  . SHOULDER SURGERY     left shoulder  . TONSILECTOMY, ADENOIDECTOMY, BILATERAL MYRINGOTOMY AND TUBES       Current Meds  Medication Sig  . Cholecalciferol (VITAMIN D3) 2000 units TABS Take 2,000 Units by mouth daily.  . furosemide (LASIX) 40 MG tablet Take 40 mg by mouth daily.   . insulin glargine (LANTUS) 100 unit/mL SOPN Inject 3 Units into the skin daily as needed (for HBS > 200).   . Ipratropium-Albuterol (COMBIVENT RESPIMAT) 20-100 MCG/ACT AERS respimat Inhale 1 puff into the lungs daily as needed for wheezing or shortness of breath.   . metolazone (ZAROXOLYN) 2.5 MG tablet Take 1 tablet (2.5 mg total) by mouth every Monday, Wednesday, and Friday.  . metoprolol tartrate (LOPRESSOR) 100 MG tablet Take 1 tablet (100 mg total) by mouth 2 (two) times daily.  . nitroGLYCERIN (NITROSTAT) 0.4 MG SL tablet Place 0.4 mg under the tongue every 5 (five) minutes as needed for chest pain.   . pantoprazole (PROTONIX) 40 MG tablet Take 40 mg by mouth daily before breakfast.   . simvastatin (ZOCOR) 20 MG tablet Take 20 mg by mouth at bedtime.       Family History: The patient's family history includes Asthma in his sister; Cancer in his father and mother; Colon cancer in his mother; Emphysema in an other family member; Esophageal cancer in his father; Hypertension in his mother; Kidney cancer in his mother; Stroke in his father.   ROS:   Please see the history of present illness.     All other systems reviewed and are negative.   Labs/Other Tests and Data Reviewed:     Recent Labs: 01/27/2019: Hemoglobin 11.7; Platelets 349 05/11/2019: BUN 29; Creatinine, Ser 1.30; NT-Pro BNP 1,913; Potassium 5.0; Sodium 140  Recent Lipid Panel No results found  for: CHOL, TRIG, HDL, CHOLHDL, VLDL, LDLCALC, LDLDIRECT    Exam:    Vital Signs:  BP (!) 150/64   Pulse 64   Temp 98 F (36.7 C)   Wt 195 lb (88.5 kg)   SpO2 95%   BMI 26.82 kg/m     Wt Readings from Last 3 Encounters:  09/01/19 195 lb (88.5 kg)  04/27/19 194 lb (88 kg)  02/05/19 195 lb (88.5 kg)     Well nourished, well developed in no acute distress. Alert awake and x3 he is talking to me from his home and I am sitting in our office in hospital not any distress quite cheerful and happy to be able to talk to me.  Diagnosis for this visit:   1. Coronary artery disease involving native coronary artery of native heart without angina pectoris   2. Essential hypertension, benign   3. Paroxysmal atrial fibrillation (HCC)   4. CHF (congestive heart failure), NYHA class II,  chronic, diastolic ([place])   5. Pulmonary hypertension ([place])      ASSESSMENT & PLAN:    1.  Coronary artery disease stable denies have any chest pain we will continue present management. Essential hypertension blood pressure appears to be well controlled we will continue present management. 3.  Paroxysmal atrial fibrillation I did review interrogation of his device which was just done few days ago no episodes of atrial fibrillation since March.  Continue anticoagulation. 4.  Congestive heart failure is stable his weight is stable we will continue present management. 5.  Pulmonary hypertension I will schedule him in the future to have an echocardiogram to reassess that.  COVID-19 Education: The signs and symptoms of COVID-19 were discussed with the patient and how to seek care for testing (follow up with PCP or arrange E-visit).  The importance of social distancing was discussed today.  Patient Risk:   After full review of this patients clinical status, I feel that they are at least moderate risk at this time.  Time:   Today, I have spent 15 minutes with the patient with telehealth technology discussing pt  health issues.  I spent 5 minutes reviewing her chart before the visit.  Visit was finished at 1105.    Medication Adjustments/Labs and Tests Ordered: Current medicines are reviewed at length with the patient today.  Concerns regarding medicines are outlined above.  No orders of the defined types were placed in this encounter.  Medication changes: No orders of the defined types were placed in this encounter.    Disposition: Follow-up 3 months  Signed, Park Liter, MD, [facility] 09/01/2019 11:03 AM    [place]

## 2019-09-03 DIAGNOSIS — H168 Other keratitis: Secondary | ICD-10-CM | POA: Diagnosis not present

## 2019-09-03 DIAGNOSIS — H209 Unspecified iridocyclitis: Secondary | ICD-10-CM | POA: Diagnosis not present

## 2019-09-10 DIAGNOSIS — H168 Other keratitis: Secondary | ICD-10-CM | POA: Diagnosis not present

## 2019-09-10 DIAGNOSIS — H209 Unspecified iridocyclitis: Secondary | ICD-10-CM | POA: Diagnosis not present

## 2019-09-11 NOTE — Progress Notes (Signed)
Remote pacemaker transmission.   

## 2019-09-24 DIAGNOSIS — Z6832 Body mass index (BMI) 32.0-32.9, adult: Secondary | ICD-10-CM | POA: Diagnosis not present

## 2019-09-24 DIAGNOSIS — Z1331 Encounter for screening for depression: Secondary | ICD-10-CM | POA: Diagnosis not present

## 2019-09-24 DIAGNOSIS — E1165 Type 2 diabetes mellitus with hyperglycemia: Secondary | ICD-10-CM | POA: Diagnosis not present

## 2019-09-24 DIAGNOSIS — K432 Incisional hernia without obstruction or gangrene: Secondary | ICD-10-CM | POA: Diagnosis not present

## 2019-09-24 DIAGNOSIS — Z9181 History of falling: Secondary | ICD-10-CM | POA: Diagnosis not present

## 2019-10-08 DIAGNOSIS — D500 Iron deficiency anemia secondary to blood loss (chronic): Secondary | ICD-10-CM | POA: Diagnosis not present

## 2019-10-08 DIAGNOSIS — K922 Gastrointestinal hemorrhage, unspecified: Secondary | ICD-10-CM | POA: Diagnosis not present

## 2019-10-13 DIAGNOSIS — M109 Gout, unspecified: Secondary | ICD-10-CM | POA: Diagnosis not present

## 2019-10-20 ENCOUNTER — Ambulatory Visit: Payer: Self-pay | Admitting: Surgery

## 2019-10-20 DIAGNOSIS — K432 Incisional hernia without obstruction or gangrene: Secondary | ICD-10-CM | POA: Diagnosis not present

## 2019-10-20 NOTE — H&P (Signed)
Lawrence [name] Documented: 10/20/2019 11:41 AM Location: [facility] Patient #: [id number] DOB: [birth year] Married / Language: Cleophus Molt / Race: White Male  History of Present Illness Lawrence Moores A. Ruben Pyka MD; 10/20/2019 12:24 PM) Patient words: Patient presents for follow-up secondary to a bulge at his midline abdominal incision. The patient underwent exploratory laparotomy in July 2019 for small bowel ischemia. He has recovered from that. His primary care noted a bulge at his midline incision at the superior aspect of this. The patient states about 3 months ago he was quite small but now is got larger especially when he stands up. He has mild discomfort and a pulling sensation at that site. No nausea, vomiting or change in bowel or bladder function noted.  The patient is a 73 year old male.   Allergies Sallyanne Kuster, CMA; 10/20/2019 11:42 AM) Bactrim *ANTI-INFECTIVE AGENTS - MISC.* Allergies Reconciled  Medication History Sallyanne Kuster, CMA; 10/20/2019 11:44 AM) Cholestyramine (4GM Packet, 1 (one) Oral four times daily, as needed, Taken starting 06/24/2019) Active. Eliquis (5MG Tablet, Oral) Active. Pantoprazole Sodium (40MG Tablet DR, Oral) Active. Lantus SoloStar (100UNIT/ML Soln Pen-inj, Subcutaneous) Active. Furosemide (40MG Tablet, Oral) Active. Simvastatin (20MG Tablet, Oral) Active. Combivent Respimat (20-100MCG/ACT Aerosol Soln, Inhalation) Active. Ipratropium-Albuterol (0.5-2.5 (3)MG/3ML Solution, Inhalation) Active. Glimepiride (2MG Tablet, Oral) Active. OXcarbazepine (300MG Tablet, Oral) Active. Colchicine (0.6MG Tablet, Oral) Active. Medications Reconciled    Vitals Sallyanne Kuster CMA; 10/20/2019 11:45 AM) 10/20/2019 11:44 AM Weight: 209.2 lb Height: 72in Body Surface Area: 2.17 m Body Mass Index: 28.37 kg/m  Temp.: 97.80F  Pulse: 105 (Regular)  BP: 160/80 (Sitting, Left Arm, Standard)        Physical Exam (Mikyle Sox A. Duy Lemming  MD; 10/20/2019 12:26 PM)  General Mental Status-Alert. General Appearance-Consistent with stated age. Hydration-Well hydrated. Voice-Normal.  Chest and Lung Exam Note: Work of breathing normal no wheezing  Cardiovascular Note: Irregular rate and rhythm controlled  Abdomen Note: Obese. Difficult to feel a hernia but there is some fullness at the superior aspect of the midline incision. This is soft and nontender. Difficult to tell from its reducible or not since examination limited by body habitus. There is no rebound or guarding.  Neurologic Note: Mental status is clear. His motor and sensory function are normal. Gait is normal  Musculoskeletal Note: No signs of gross muscle weakness or sensory deficit    Assessment & Plan (Vondra Aldredge A. Simora Dingee MD; 10/20/2019 12:26 PM)  INCISIONAL HERNIA OF ANTERIOR ABDOMINAL WALL WITHOUT OBSTRUCTION OR GANGRENE (K43.2) Impression: Obtain computed tomography scan to better define anatomy given obesity Discussed both open repair with mesh and laparoscopic repair with mesh. The repair will be dictated by CT findings. Risks, benefits, the use of mesh and potential complications and long-term expectations as well as potential recurrence rates discussed today. The risk of hernia repair include bleeding, infection, organ injury, bowel injury, bladder injury, nerve injury recurrent hernia, blood clots, worsening of underlying condition, chronic pain, mesh use, open surgery, death, and the need for other operations. Pt agrees to proceed  Current Plans The anatomy & physiology of the abdominal wall was discussed. The pathophysiology of hernias was discussed. Natural history risks without surgery including progeressive enlargement, pain, incarceration, & strangulation was discussed. Contributors to complications such as smoking, obesity, diabetes, prior surgery, etc were discussed.  I feel the risks of no intervention will lead to serious  problems that outweigh the operative risks; therefore, I recommended surgery to reduce and repair the hernia. I explained laparoscopic techniques with possible need  for an open approach. I noted the probable use of mesh to patch and/or buttress the hernia repair  Risks such as bleeding, infection, abscess, need for further treatment, heart attack, death, and other risks were discussed. I noted a good likelihood this will help address the problem. Goals of post-operative recovery were discussed as well. Possibility that this will not correct all symptoms was explained. I stressed the importance of low-impact activity, aggressive pain control, avoiding constipation, & not pushing through pain to minimize risk of post-operative chronic pain or injury. Possibility of reherniation especially with smoking, obesity, diabetes, immunosuppression, and other health conditions was discussed. We will work to minimize complications.  An educational handout further explaining the pathology & treatment options was given as well. Questions were answered. The patient expresses understanding & wishes to proceed with surgery.  Pt Education - Pamphlet Given - Hernia Surgery: discussed with patient and provided information. Pt Education - CCS Mesh education: discussed with patient and provided information.

## 2019-10-22 ENCOUNTER — Other Ambulatory Visit: Payer: Self-pay | Admitting: Surgery

## 2019-10-22 DIAGNOSIS — K432 Incisional hernia without obstruction or gangrene: Secondary | ICD-10-CM

## 2019-10-30 ENCOUNTER — Ambulatory Visit
Admission: RE | Admit: 2019-10-30 | Discharge: 2019-10-30 | Disposition: A | Discharge status: Home / Self Care | Payer: Medicare Other | Source: Ambulatory Visit | Attending: Surgery | Admitting: Surgery

## 2019-10-30 ENCOUNTER — Other Ambulatory Visit: Payer: Self-pay

## 2019-10-30 DIAGNOSIS — K5730 Diverticulosis of large intestine without perforation or abscess without bleeding: Secondary | ICD-10-CM | POA: Diagnosis not present

## 2019-10-30 DIAGNOSIS — K432 Incisional hernia without obstruction or gangrene: Secondary | ICD-10-CM

## 2019-10-30 MED ORDER — IOPAMIDOL (ISOVUE-300) INJECTION 61%
100.0000 mL | Freq: Once | INTRAVENOUS | Status: AC | PRN
  Administered 2019-10-30: 100 mL via INTRAVENOUS

## 2019-11-02 ENCOUNTER — Ambulatory Visit: Payer: Self-pay | Admitting: Surgery

## 2019-11-09 ENCOUNTER — Ambulatory Visit: Payer: Self-pay | Admitting: Surgery

## 2019-11-09 DIAGNOSIS — K4090 Unilateral inguinal hernia, without obstruction or gangrene, not specified as recurrent: Secondary | ICD-10-CM | POA: Diagnosis not present

## 2019-11-09 DIAGNOSIS — K432 Incisional hernia without obstruction or gangrene: Secondary | ICD-10-CM | POA: Diagnosis not present

## 2019-11-09 NOTE — H&P (Signed)
Lawrence [name] Documented: 11/09/2019 10:28 AM Location: [facility] Patient #: [id number] DOB: 02-20-1946 Married / Language: Lawrence [name] / Race: White Male  History of Present Illness Lawrence [name] A. Lawrence Polo MD; 11/09/2019 10:47 AM) Patient words: Patient returns for follow-up after computed tomography scan of abdomen and pelvis to evaluate for incisional hernia. This showed a 5 cm x 7 cm incisional hernia just above the umbilicus at his previous laparotomy site. This also showed a moderate size left inguinal hernia with fat. He states he's been aware of the left inguinal hernia for a number of years and would like to have both hernias repaired at the same time.  The patient is a 73 year old male.   Allergies Lawrence [name], CMA; 11/09/2019 10:29 AM) Bactrim *ANTI-INFECTIVE AGENTS - MISC.* Allergies Reconciled  Medication History Lawrence [name], CMA; 11/09/2019 10:29 AM) Cholestyramine (4GM Packet, 1 (one) Oral four times daily, as needed, Taken starting 06/24/2019) Active. Glimepiride (2MG Tablet, Oral) Active. OXcarbazepine (300MG Tablet, Oral) Active. Colchicine (0.6MG Tablet, Oral) Active. Eliquis (5MG Tablet, Oral) Active. Pantoprazole Sodium (40MG Tablet DR, Oral) Active. Lantus SoloStar (100UNIT/ML Soln Pen-inj, Subcutaneous) Active. Furosemide (40MG Tablet, Oral) Active. Simvastatin (20MG Tablet, Oral) Active. Combivent Respimat (20-100MCG/ACT Aerosol Soln, Inhalation) Active. Ipratropium-Albuterol (0.5-2.5 (3)MG/3ML Solution, Inhalation) Active. Medications Reconciled    Vitals Lawrence [name] CMA; 11/09/2019 10:29 AM) 11/09/2019 10:29 AM Weight: 209.2 lb Height: 72in Body Surface Area: 2.17 m Body Mass Index: 28.37 kg/m  Temp.: 98.60F  Pulse: 81 (Regular)  BP: 140/78 (Sitting, Left Arm, Standard)        Physical Exam (Lawrence [name] A. Lawrence Lezotte MD; 11/09/2019 10:48 AM)  General Mental Status-Alert. General Appearance-Consistent with  stated age. Hydration-Well hydrated. Voice-Normal.  Chest and Lung Exam Note: Work of breathing normal lung sounds clear  Cardiovascular Note: Normal sinus rhythm  Abdomen Note: Reducible hernia just above the umbilicus. Reducible left inguinal hernia which is small and difficult to feel but easy to palpate when he is standing up. No evidence of right inguinal hernia.  Neurologic Neurologic evaluation reveals -alert and oriented x 3 with no impairment of recent or remote memory. Mental Status-Normal.    Assessment & Plan (Lawrence [name] A. Lawrence Glotfelty MD; 11/09/2019 10:48 AM)  INCISIONAL HERNIA OF ANTERIOR ABDOMINAL WALL WITHOUT OBSTRUCTION OR GANGRENE (K43.2) Impression: CT shows a 5 cm x 7 cm incisional hernia just above the umbilicus. Recommend open repair as well as repair of his left inguinal hernia that was found on computed tomography scan. He understands the risk and would like to proceed. We'll obtain cardiac clearance. Risks, benefits, the use of mesh and potential complications and long-term expectations as well as potential recurrence rates discussed today. The risk of hernia repair include bleeding, infection, organ injury, bowel injury, bladder injury, nerve injury recurrent hernia, blood clots, worsening of underlying condition, chronic pain, mesh use, open surgery, death, and the need for other operations. Pt agrees to proceed  Current Plans Pt Education - Consent for inguinal hernia - Lawrence [name]: discussed with patient and provided information. Pt Education - CCS Mesh education: discussed with patient and provided information. The anatomy & physiology of the abdominal wall was discussed. The pathophysiology of hernias was discussed. Natural history risks without surgery including progressive enlargement, pain, incarceration, & strangulation was discussed. Contributors to complications such as smoking, obesity, diabetes, prior surgery, etc were discussed.  I feel the  risks of no intervention will lead to serious problems that outweigh the operative risks; therefore, I recommended surgery to reduce and repair the hernia.  I explained an open approach. I noted the probable use of mesh to patch and/or buttress the hernia repair  Risks such as bleeding, infection, abscess, need for further treatment, heart attack, death, and other risks were discussed. I noted a good likelihood this will help address the problem. Goals of post-operative recovery were discussed as well. Possibility that this will not correct all symptoms was explained. I stressed the importance of low-impact activity, aggressive pain control, avoiding constipation, & not pushing through pain to minimize risk of post-operative chronic pain or injury. Possibility of reherniation especially with smoking, obesity, diabetes, immunosuppression, and other health conditions was discussed. We will work to minimize complications.  An educational handout further explaining the pathology & treatment options was given as well. Questions were answered. The patient expresses understanding & wishes to proceed with surgery.   LEFT INGUINAL HERNIA (K40.90) Impression: Recommend repair with mesh. The risk of hernia repair include bleeding, infection, organ injury, bowel injury, bladder injury, nerve injury recurrent hernia, blood clots, worsening of underlying condition, chronic pain, mesh use, open surgery, death, and the need for other operattions. Pt agrees to proceed

## 2019-11-09 NOTE — H&P (View-Only) (Signed)
Lawrence [name] Documented: 11/09/2019 10:28 AM Location: [facility] Patient #: [id number] DOB: Apr 28, 1946 Married / Language: Cleophus Molt / Race: White Male  History of Present Illness Lawrence [name]; 11/09/2019 10:47 AM) Patient words: Patient returns for follow-up after computed tomography scan of abdomen and pelvis to evaluate for incisional hernia. This showed a 5 cm x 7 cm incisional hernia just above the umbilicus at his previous laparotomy site. This also showed a moderate size left inguinal hernia with fat. He states he's been aware of the left inguinal hernia for a number of years and would like to have both hernias repaired at the same time.  The patient is a 73 year old male.   Allergies Sallyanne Kuster, CMA; 11/09/2019 10:29 AM) Bactrim *ANTI-INFECTIVE AGENTS - MISC.* Allergies Reconciled  Medication History Sallyanne Kuster, CMA; 11/09/2019 10:29 AM) Cholestyramine (4GM Packet, 1 (one) Oral four times daily, as needed, Taken starting 06/24/2019) Active. Glimepiride (2MG Tablet, Oral) Active. OXcarbazepine (300MG Tablet, Oral) Active. Colchicine (0.6MG Tablet, Oral) Active. Eliquis (5MG Tablet, Oral) Active. Pantoprazole Sodium (40MG Tablet DR, Oral) Active. Lantus SoloStar (100UNIT/ML Soln Pen-inj, Subcutaneous) Active. Furosemide (40MG Tablet, Oral) Active. Simvastatin (20MG Tablet, Oral) Active. Combivent Respimat (20-100MCG/ACT Aerosol Soln, Inhalation) Active. Ipratropium-Albuterol (0.5-2.5 (3)MG/3ML Solution, Inhalation) Active. Medications Reconciled    Vitals Sallyanne Kuster CMA; 11/09/2019 10:29 AM) 11/09/2019 10:29 AM Weight: 209.2 lb Height: 72in Body Surface Area: 2.17 m Body Mass Index: 28.37 kg/m  Temp.: 98.9F  Pulse: 81 (Regular)  BP: 140/78 (Sitting, Left Arm, Standard)        Physical Exam (Avnoor Koury A. Arie Powell [name]; 11/09/2019 10:48 AM)  General Mental Status-Alert. General Appearance-Consistent with  stated age. Hydration-Well hydrated. Voice-Normal.  Chest and Lung Exam Note: Work of breathing normal lung sounds clear  Cardiovascular Note: Normal sinus rhythm  Abdomen Note: Reducible hernia just above the umbilicus. Reducible left inguinal hernia which is small and difficult to feel but easy to palpate when he is standing up. No evidence of right inguinal hernia.  Neurologic Neurologic evaluation reveals -alert and oriented x 3 with no impairment of recent or remote memory. Mental Status-Normal.    Assessment & Plan (Tanda Morrissey A. Veron Senner [name]; 11/09/2019 10:48 AM)  INCISIONAL HERNIA OF ANTERIOR ABDOMINAL WALL WITHOUT OBSTRUCTION OR GANGRENE (K43.2) Impression: CT shows a 5 cm x 7 cm incisional hernia just above the umbilicus. Recommend open repair as well as repair of his left inguinal hernia that was found on computed tomography scan. He understands the risk and would like to proceed. We'll obtain cardiac clearance. Risks, benefits, the use of mesh and potential complications and long-term expectations as well as potential recurrence rates discussed today. The risk of hernia repair include bleeding, infection, organ injury, bowel injury, bladder injury, nerve injury recurrent hernia, blood clots, worsening of underlying condition, chronic pain, mesh use, open surgery, death, and the need for other operations. Pt agrees to proceed  Current Plans Pt Education - Consent for inguinal hernia - [place]: discussed with patient and provided information. Pt Education - CCS Mesh education: discussed with patient and provided information. The anatomy & physiology of the abdominal wall was discussed. The pathophysiology of hernias was discussed. Natural history risks without surgery including progressive enlargement, pain, incarceration, & strangulation was discussed. Contributors to complications such as smoking, obesity, diabetes, prior surgery, etc were discussed.  I feel the  risks of no intervention will lead to serious problems that outweigh the operative risks; therefore, I recommended surgery to reduce and repair the hernia.  I explained an open approach. I noted the probable use of mesh to patch and/or buttress the hernia repair  Risks such as bleeding, infection, abscess, need for further treatment, heart attack, death, and other risks were discussed. I noted a good likelihood this will help address the problem. Goals of post-operative recovery were discussed as well. Possibility that this will not correct all symptoms was explained. I stressed the importance of low-impact activity, aggressive pain control, avoiding constipation, & not pushing through pain to minimize risk of post-operative chronic pain or injury. Possibility of reherniation especially with smoking, obesity, diabetes, immunosuppression, and other health conditions was discussed. We will work to minimize complications.  An educational handout further explaining the pathology & treatment options was given as well. Questions were answered. The patient expresses understanding & wishes to proceed with surgery.   LEFT INGUINAL HERNIA (K40.90) Impression: Recommend repair with mesh. The risk of hernia repair include bleeding, infection, organ injury, bowel injury, bladder injury, nerve injury recurrent hernia, blood clots, worsening of underlying condition, chronic pain, mesh use, open surgery, death, and the need for other operattions. Pt agrees to proceed

## 2019-11-25 ENCOUNTER — Ambulatory Visit (INDEPENDENT_AMBULATORY_CARE_PROVIDER_SITE_OTHER): Payer: Medicare Other | Admitting: *Deleted

## 2019-11-25 ENCOUNTER — Telehealth: Payer: Self-pay | Admitting: Emergency Medicine

## 2019-11-25 DIAGNOSIS — I480 Paroxysmal atrial fibrillation: Secondary | ICD-10-CM

## 2019-11-25 LAB — CUP PACEART REMOTE DEVICE CHECK
Battery Remaining Longevity: 111 mo
Battery Remaining Percentage: 95.5 %
Battery Voltage: 3.01 V
Brady Statistic AP VP Percent: 3.9 %
Brady Statistic AP VS Percent: 82 %
Brady Statistic AS VP Percent: 1 %
Brady Statistic AS VS Percent: 14 %
Brady Statistic RA Percent Paced: 58 %
Brady Statistic RV Percent Paced: 9.1 %
Date Time Interrogation Session: 20201202020015
Implantable Lead Implant Date: 20180830
Implantable Lead Implant Date: 20180830
Implantable Lead Location: 753859
Implantable Lead Location: 753860
Implantable Pulse Generator Implant Date: 20180830
Lead Channel Impedance Value: 390 Ohm
Lead Channel Impedance Value: 410 Ohm
Lead Channel Pacing Threshold Amplitude: 0.625 V
Lead Channel Pacing Threshold Amplitude: 0.75 V
Lead Channel Pacing Threshold Pulse Width: 0.5 ms
Lead Channel Pacing Threshold Pulse Width: 0.5 ms
Lead Channel Sensing Intrinsic Amplitude: 11.3 mV
Lead Channel Sensing Intrinsic Amplitude: 2 mV
Lead Channel Setting Pacing Amplitude: 1.625
Lead Channel Setting Pacing Amplitude: 2.5 V
Lead Channel Setting Pacing Pulse Width: 0.5 ms
Lead Channel Setting Sensing Sensitivity: 2 mV
Pulse Gen Model: 2272
Pulse Gen Serial Number: 8939815

## 2019-11-25 NOTE — Telephone Encounter (Signed)
Attempted to call patient to get him scheduled for a follow up appointment due to his upcoming surgery. No answer. Will continue efforts.

## 2019-11-30 ENCOUNTER — Other Ambulatory Visit: Payer: Self-pay

## 2019-11-30 ENCOUNTER — Encounter: Payer: Self-pay | Admitting: Cardiology

## 2019-11-30 ENCOUNTER — Ambulatory Visit (INDEPENDENT_AMBULATORY_CARE_PROVIDER_SITE_OTHER): Payer: Medicare Other | Admitting: Cardiology

## 2019-11-30 ENCOUNTER — Telehealth (HOSPITAL_COMMUNITY): Payer: Self-pay | Admitting: *Deleted

## 2019-11-30 VITALS — BP 150/64 | HR 72 | Ht 71.5 in | Wt 211.8 lb

## 2019-11-30 DIAGNOSIS — Z950 Presence of cardiac pacemaker: Secondary | ICD-10-CM | POA: Diagnosis not present

## 2019-11-30 DIAGNOSIS — I480 Paroxysmal atrial fibrillation: Secondary | ICD-10-CM | POA: Diagnosis not present

## 2019-11-30 DIAGNOSIS — I272 Pulmonary hypertension, unspecified: Secondary | ICD-10-CM | POA: Diagnosis not present

## 2019-11-30 DIAGNOSIS — I2510 Atherosclerotic heart disease of native coronary artery without angina pectoris: Secondary | ICD-10-CM | POA: Diagnosis not present

## 2019-11-30 DIAGNOSIS — I5032 Chronic diastolic (congestive) heart failure: Secondary | ICD-10-CM | POA: Diagnosis not present

## 2019-11-30 DIAGNOSIS — E785 Hyperlipidemia, unspecified: Secondary | ICD-10-CM | POA: Diagnosis not present

## 2019-11-30 NOTE — Addendum Note (Signed)
Addended by: Ashok Norris on: 11/30/2019 09:20 AM   Modules accepted: Orders

## 2019-11-30 NOTE — Telephone Encounter (Signed)
Left message on voicemail per DPR in reference to upcoming appointment scheduled on 11/29/19 with detailed instructions given per Myocardial Perfusion Study Information Sheet for the test. LM to arrive 15 minutes early, and that it is imperative to arrive on time for appointment to keep from having the test rescheduled. If you need to cancel or reschedule your appointment, please call the office within 24 hours of your appointment. Failure to do so may result in a cancellation of your appointment, and a $50 no show fee. Phone number given for call back for any questions. Kirstie Peri

## 2019-11-30 NOTE — Progress Notes (Signed)
Cardiology Office Note:    Date:  11/30/2019   ID:  Lawrence [name], DOB 14-Nov-1946, MRN [id number]  PCP:  [name], MD  Cardiologist:  [name], MD    Referring MD: [name], MD   Chief Complaint  Patient presents with  . Pre-op Exam  Doing well  History of Present Illness:    Lawrence [name] is a 73 y.o. male with past medical history significant for coronary artery disease, history of cardiomyopathy however latest ejection fraction done more than year ago showed normalization of left ventricle ejection fraction, diabetes, essential hypertension, dyslipidemia.  Comes today to my office for follow-up.  Overall seems to be doing well.  He is scheduled to have abdominal surgery for hernia.  Denies having any chest pain, tightness, pressure, burning in the chest his exercise capacity is somewhat limited but still doing quite well.  Past Medical History:  Diagnosis Date  . Anemia   . Chronic airway obstruction, not elsewhere classified   . Chronic ischemic heart disease, unspecified   . Coronary artery disease   . Diabetes mellitus    type II, neuropathy,   . Essential hypertension, benign   . Hyperlipidemia, mixed   . Nodule of kidney    incidentally found on CT abdomen 11/2012.  Pending Urology evaluation.   . Nontoxic multinodular goiter   . Obesity   . Other testicular hypofunction   . PUD (peptic ulcer disease)   . Renal insufficiency, mild    hx of  . Sleep apnea    uses cpap    Past Surgical History:  Procedure Laterality Date  . ATRIAL FIBRILLATION ABLATION N/A 02/05/2019   Procedure: ATRIAL FIBRILLATION ABLATION;  Surgeon: Constance Haw, MD;  Location: [place] [facility];  Service: Cardiovascular;  Laterality: N/A;  . CHOLECYSTECTOMY    . COLONOSCOPY  05/11/2015   Colonic polyps statuts post polypectomy. Moderate predominalty sigmoid diverticulosis. Internal hemorrhoids. Tubular adenoma.   . CORONARY ARTERY BYPASS GRAFT   2007   x 4  . ESOPHAGOGASTRODUODENOSCOPY     10/06/2012:  hiatal hernia, moderate gastritis.  2012: [facility]oscopy at [facility]:  one polyp.  Due next 2017  . EYE SURGERY    . KNEE DEBRIDEMENT    . LAPAROTOMY N/A 07/21/2018   Procedure: EXPLORATORY LAPAROTOMY WITH SMALL BOWEL RESECTION;  Surgeon: Erroll Luna, MD;  Location: [facility];  Service: General;  Laterality: N/A;  . PACEMAKER IMPLANT N/A 08/22/2017   Procedure: Pacemaker Implant;  Surgeon: Constance Haw, MD;  Location: [place] [facility];  Service: Cardiovascular;  Laterality: N/A;  . SHOULDER SURGERY     left shoulder  . TONSILECTOMY, ADENOIDECTOMY, BILATERAL MYRINGOTOMY AND TUBES      Current Medications: Current Meds  Medication Sig  . apixaban (ELIQUIS) 5 MG TABS tablet Take 5 mg by mouth 2 (two) times daily.  . Capsaicin 0.1 % CREA Apply topically.  . Cholecalciferol (VITAMIN D3) 2000 units TABS Take 2,000 Units by mouth daily.  . cholestyramine (QUESTRAN) 4 g packet Take 4 g by mouth 4 (four) times daily.  . colchicine 0.6 MG tablet Take 0.6 mg by mouth daily.  . furosemide (LASIX) 40 MG tablet Take 40 mg by mouth daily.   Marland Kitchen glimepiride (AMARYL) 2 MG tablet Take 2 mg by mouth as needed.  . insulin glargine (LANTUS) 100 unit/mL SOPN Inject 3 Units into the skin daily as needed (for HBS > 200).   . Ipratropium-Albuterol (COMBIVENT RESPIMAT) 20-100  MCG/ACT AERS respimat Inhale 1 puff into the lungs daily as needed for wheezing or shortness of breath.   . lidocaine (LIDODERM) 5 % Place 3 patches onto the skin daily. Remove & Discard patch within 12 hours or as directed by MD  . metolazone (ZAROXOLYN) 2.5 MG tablet Take 1 tablet (2.5 mg total) by mouth every Monday, Wednesday, and Friday. (Patient taking differently: Take 2.5 mg by mouth. Monday and Friday)  . metoprolol tartrate (LOPRESSOR) 100 MG tablet Take 1 tablet (100 mg total) by mouth 2 (two) times daily.  . nitroGLYCERIN (NITROSTAT) 0.4 MG SL tablet Place 0.4 mg under the  tongue every 5 (five) minutes as needed for chest pain.   . Oxcarbazepine (TRILEPTAL) 300 MG tablet Take 150 mg by mouth 2 (two) times daily. One tablet at bedtime if needed  . pantoprazole (PROTONIX) 40 MG tablet Take 40 mg by mouth daily before breakfast.   . simvastatin (ZOCOR) 20 MG tablet Take 20 mg by mouth at bedtime.      Allergies:   Ciprofloxacin, Sulfamethoxazole-trimethoprim, Lisinopril, and Naproxen   Social History   Socioeconomic History  . Marital status: Married    Spouse name: Not on file  . Number of children: 5  . Years of education: Not on file  . Highest education level: Not on file  Occupational History  . Occupation: Retired    Comment: Medical illustrator; retired Chiropodist  Social Needs  . Financial resource strain: Not on file  . Food insecurity    Worry: Not on file    Inability: Not on file  . Transportation needs    Medical: Not on file    Non-medical: Not on file  Tobacco Use  . Smoking status: Former Smoker    Packs/day: 1.50    Years: 30.00    Pack years: 45.00    Types: Cigarettes    Quit date: 2006    Years since quitting: 14.9  . Smokeless tobacco: Former Systems developer    Types: Chew  Substance and Sexual Activity  . Alcohol use: Yes    Comment: rare  . Drug use: No  . Sexual activity: Not on file  Lifestyle  . Physical activity    Days per week: Not on file    Minutes per session: Not on file  . Stress: Not on file  Relationships  . Social Herbalist on phone: Not on file    Gets together: Not on file    Attends religious service: Not on file    Active member of club or organization: Not on file    Attends meetings of clubs or organizations: Not on file    Relationship status: Not on file  Other Topics Concern  . Not on file  Social History Narrative   ** Merged History Encounter **       Lives with wife in a one story home.  Has 6 children.  Retired Nature conservation officer.  Education: college.     Family History: The  patient's family history includes Asthma in his sister; Cancer in his father and mother; Colon cancer in his mother; Emphysema in an other family member; Esophageal cancer in his father; Hypertension in his mother; Kidney cancer in his mother; Stroke in his father. ROS:   Please see the history of present illness.    All 14 point review of systems negative except as described per history of present illness  EKGs/Labs/Other Studies Reviewed:      Recent Labs:  01/27/2019: Hemoglobin 11.7; Platelets 349 05/11/2019: BUN 29; Creatinine, Ser 1.30; NT-Pro BNP 1,913; Potassium 5.0; Sodium 140  Recent Lipid Panel No results found for: CHOL, TRIG, HDL, CHOLHDL, VLDL, LDLCALC, LDLDIRECT  Physical Exam:    VS:  BP (!) 150/64   Pulse 72   Ht 5' 11.5" (1.816 m)   Wt 211 lb 12.8 oz (96.1 kg)   SpO2 98%   BMI 29.13 kg/m     Wt Readings from Last 3 Encounters:  11/30/19 211 lb 12.8 oz (96.1 kg)  09/01/19 195 lb (88.5 kg)  04/27/19 194 lb (88 kg)     GEN:  Well nourished, well developed in no acute distress HEENT: Normal NECK: No JVD; No carotid bruits LYMPHATICS: No lymphadenopathy CARDIAC: RRR, no murmurs, no rubs, no gallops RESPIRATORY:  Clear to auscultation without rales, wheezing or rhonchi  ABDOMEN: Soft, non-tender, non-distended MUSCULOSKELETAL:  No edema; No deformity  SKIN: Warm and dry LOWER EXTREMITIES: no swelling NEUROLOGIC:  Alert and oriented x 3 PSYCHIATRIC:  Normal affect   ASSESSMENT:    1. Coronary artery disease involving native coronary artery of native heart without angina pectoris   2. CHF (congestive heart failure), NYHA class II, chronic, diastolic (HCC)   3. Paroxysmal atrial fibrillation ([place])   4. Pulmonary hypertension ([place])   5. Pacemaker   6. Dyslipidemia    PLAN:    In order of problems listed above:  1. Coronary artery disease.  As a part of evaluation for surgery I think it would be prudent to schedule him to have a stress test.  We will do  Lexiscan to rule out any significant ischemia. 2. Congestive heart failure.  Appears to be compensated.  I will ask him to have an echocardiogram to assess left ventricle ejection fraction. 3. Paroxysmal atrial fibrillation is, status post ablation, interrogation of his device has been reviewed had very short episode of atrial fibrillation but overall doing well from that point review.  We will continue anticoagulation.  His anticoagulation can be withdrawal 48 hours before surgery done need to be started back as soon as feasible from surgical point of view. 4. Pulmonary hypertension echocardiogram will be done to assess that. 5. Pacemaker followed by our EP team.  Stable.  Battery status doing well, parameters acceptable. 6. Dyslipidemia in the future we will do fasting lipid profile.   Medication Adjustments/Labs and Tests Ordered: Current medicines are reviewed at length with the patient today.  Concerns regarding medicines are outlined above.  No orders of the defined types were placed in this encounter.  Medication changes: No orders of the defined types were placed in this encounter.   Signed, Park Liter, MD, [facility] 11/30/2019 9:02 AM    [place]

## 2019-11-30 NOTE — Patient Instructions (Signed)
Medication Instructions:  Your physician recommends that you continue on your current medications as directed. Please refer to the Current Medication list given to you today.  *If you need a refill on your cardiac medications before your next appointment, please call your pharmacy*  Lab Work: None.  If you have labs (blood work) drawn today and your tests are completely normal, you will receive your results only by: Marland Kitchen MyChart Message (if you have MyChart) OR . A paper copy in the mail If you have any lab test that is abnormal or we need to change your treatment, we will call you to review the results.  Testing/Procedures: Your physician has requested that you have an echocardiogram. Echocardiography is a painless test that uses sound waves to create images of your heart. It provides your doctor with information about the size and shape of your heart and how well your heart's chambers and valves are working. This procedure takes approximately one hour. There are no restrictions for this procedure.  Your physician has requested that you have a lexiscan myoview. For further information please visit [email or web address]. Please follow instruction sheet, as given.    Follow-Up: At [facility], you and your health needs are our priority.  As part of our continuing mission to provide you with exceptional heart care, we have created designated Provider Care Teams.  These Care Teams include your primary Cardiologist (physician) and Advanced Practice Providers (APPs -  Physician Assistants and Nurse Practitioners) who all work together to provide you with the care you need, when you need it.  Your next appointment:   3 month(s)  The format for your next appointment:   In Person  Provider:   [name], MD  Other Instructions   Cardiac Nuclear Scan A cardiac nuclear scan is a test that measures blood flow to the heart when a person is resting and when he or she is exercising. The  test looks for problems such as:  Not enough blood reaching a portion of the heart.  The heart muscle not working normally. You may need this test if:  You have heart disease.  You have had abnormal lab results.  You have had heart surgery or a balloon procedure to open up blocked arteries (angioplasty).  You have chest pain.  You have shortness of breath. In this test, a radioactive dye (tracer) is injected into your bloodstream. After the tracer has traveled to your heart, an imaging device is used to measure how much of the tracer is absorbed by or distributed to various areas of your heart. This procedure is usually done at a hospital and takes 2-4 hours. Tell a health care provider about:  Any allergies you have.  All medicines you are taking, including vitamins, herbs, eye drops, creams, and over-the-counter medicines.  Any problems you or family members have had with anesthetic medicines.  Any blood disorders you have.  Any surgeries you have had.  Any medical conditions you have.  Whether you are pregnant or may be pregnant. What are the risks? Generally, this is a safe procedure. However, problems may occur, including:  Serious chest pain and heart attack. This is only a risk if the stress portion of the test is done.  Rapid heartbeat.  Sensation of warmth in your chest. This usually passes quickly.  Allergic reaction to the tracer. What happens before the procedure?  Ask your health care provider about changing or stopping your regular medicines. This is especially important if you are taking  diabetes medicines or blood thinners.  Follow instructions from your health care provider about eating or drinking restrictions.  Remove your jewelry on the day of the procedure. What happens during the procedure?  An IV will be inserted into one of your veins.  Your health care provider will inject a small amount of radioactive tracer through the IV.  You will  wait for 20-40 minutes while the tracer travels through your bloodstream.  Your heart activity will be monitored with an electrocardiogram (ECG).  You will lie down on an exam table.  Images of your heart will be taken for about 15-20 minutes.  You may also have a stress test. For this test, one of the following may be done: ? You will exercise on a treadmill or stationary bike. While you exercise, your heart's activity will be monitored with an ECG, and your blood pressure will be checked. ? You will be given medicines that will increase blood flow to parts of your heart. This is done if you are unable to exercise.  When blood flow to your heart has peaked, a tracer will again be injected through the IV.  After 20-40 minutes, you will get back on the exam table and have more images taken of your heart.  Depending on the type of tracer used, scans may need to be repeated 3-4 hours later.  Your IV line will be removed when the procedure is over. The procedure may vary among health care providers and hospitals. What happens after the procedure?  Unless your health care provider tells you otherwise, you may return to your normal schedule, including diet, activities, and medicines.  Unless your health care provider tells you otherwise, you may increase your fluid intake. This will help to flush the contrast dye from your body. Drink enough fluid to keep your urine pale yellow.  Ask your health care provider, or the department that is doing the test: ? When will my results be ready? ? How will I get my results? Summary  A cardiac nuclear scan measures the blood flow to the heart when a person is resting and when he or she is exercising.  Tell your health care provider if you are pregnant.  Before the procedure, ask your health care provider about changing or stopping your regular medicines. This is especially important if you are taking diabetes medicines or blood thinners.  After the  procedure, unless your health care provider tells you otherwise, increase your fluid intake. This will help flush the contrast dye from your body.  After the procedure, unless your health care provider tells you otherwise, you may return to your normal schedule, including diet, activities, and medicines. This information is not intended to replace advice given to you by your health care provider. Make sure you discuss any questions you have with your health care provider. Document Released: 01/04/2005 Document Revised: 05/26/2018 Document Reviewed: 05/26/2018 Elsevier Patient Education  Day.   Echocardiogram An echocardiogram is a procedure that uses painless sound waves (ultrasound) to produce an image of the heart. Images from an echocardiogram can provide important information about:  Signs of coronary artery disease (CAD).  Aneurysm detection. An aneurysm is a weak or damaged part of an artery wall that bulges out from the normal force of blood pumping through the body.  Heart size and shape. Changes in the size or shape of the heart can be associated with certain conditions, including heart failure, aneurysm, and CAD.  Heart muscle function.  Heart valve function.  Signs of a past heart attack.  Fluid buildup around the heart.  Thickening of the heart muscle.  A tumor or infectious growth around the heart valves. Tell a health care provider about:  Any allergies you have.  All medicines you are taking, including vitamins, herbs, eye drops, creams, and over-the-counter medicines.  Any blood disorders you have.  Any surgeries you have had.  Any medical conditions you have.  Whether you are pregnant or may be pregnant. What are the risks? Generally, this is a safe procedure. However, problems may occur, including:  Allergic reaction to dye (contrast) that may be used during the procedure. What happens before the procedure? No specific preparation is  needed. You may eat and drink normally. What happens during the procedure?   An IV tube may be inserted into one of your veins.  You may receive contrast through this tube. A contrast is an injection that improves the quality of the pictures from your heart.  A gel will be applied to your chest.  A wand-like tool (transducer) will be moved over your chest. The gel will help to transmit the sound waves from the transducer.  The sound waves will harmlessly bounce off of your heart to allow the heart images to be captured in real-time motion. The images will be recorded on a computer. The procedure may vary among health care providers and hospitals. What happens after the procedure?  You may return to your normal, everyday life, including diet, activities, and medicines, unless your health care provider tells you not to do that. Summary  An echocardiogram is a procedure that uses painless sound waves (ultrasound) to produce an image of the heart.  Images from an echocardiogram can provide important information about the size and shape of your heart, heart muscle function, heart valve function, and fluid buildup around your heart.  You do not need to do anything to prepare before this procedure. You may eat and drink normally.  After the echocardiogram is completed, you may return to your normal, everyday life, unless your health care provider tells you not to do that. This information is not intended to replace advice given to you by your health care provider. Make sure you discuss any questions you have with your health care provider. Document Released: 12/07/2000 Document Revised: 04/02/2019 Document Reviewed: 01/12/2017 Elsevier Patient Education  2020 [employer].

## 2019-12-02 ENCOUNTER — Other Ambulatory Visit: Payer: Self-pay

## 2019-12-02 ENCOUNTER — Ambulatory Visit (HOSPITAL_BASED_OUTPATIENT_CLINIC_OR_DEPARTMENT_OTHER): Payer: Medicare Other

## 2019-12-02 VITALS — Ht 71.5 in | Wt 211.0 lb

## 2019-12-02 DIAGNOSIS — I2510 Atherosclerotic heart disease of native coronary artery without angina pectoris: Secondary | ICD-10-CM

## 2019-12-02 LAB — MYOCARDIAL PERFUSION IMAGING
LV dias vol: 104 mL (ref 62–150)
LV sys vol: 42 mL
Peak HR: 72 {beats}/min
Rest HR: 65 {beats}/min
SDS: 3
SRS: 0
SSS: 3
TID: 1.04

## 2019-12-02 MED ORDER — TECHNETIUM TC 99M TETROFOSMIN IV KIT
9.6000 | PACK | Freq: Once | INTRAVENOUS | Status: AC | PRN
  Administered 2019-12-02: 9.6 via INTRAVENOUS
  Filled 2019-12-02: qty 10

## 2019-12-02 MED ORDER — REGADENOSON 0.4 MG/5ML IV SOLN
0.4000 mg | Freq: Once | INTRAVENOUS | Status: AC
  Administered 2019-12-02: 0.4 mg via INTRAVENOUS

## 2019-12-02 MED ORDER — TECHNETIUM TC 99M TETROFOSMIN IV KIT
31.9000 | PACK | Freq: Once | INTRAVENOUS | Status: AC | PRN
  Administered 2019-12-02: 31.9 via INTRAVENOUS
  Filled 2019-12-02: qty 32

## 2019-12-03 ENCOUNTER — Ambulatory Visit (HOSPITAL_BASED_OUTPATIENT_CLINIC_OR_DEPARTMENT_OTHER)
Admission: RE | Admit: 2019-12-03 | Discharge: 2019-12-03 | Disposition: A | Discharge status: Home / Self Care | Payer: Medicare Other | Source: Ambulatory Visit | Attending: Cardiology | Admitting: Cardiology

## 2019-12-03 DIAGNOSIS — I2510 Atherosclerotic heart disease of native coronary artery without angina pectoris: Secondary | ICD-10-CM | POA: Insufficient documentation

## 2019-12-03 MED ORDER — PERFLUTREN LIPID MICROSPHERE
1.0000 mL | INTRAVENOUS | Status: AC | PRN
  Administered 2019-12-03: 3 mL via INTRAVENOUS
  Filled 2019-12-03: qty 10

## 2019-12-03 NOTE — Progress Notes (Signed)
  Echocardiogram 2D Echocardiogram has been performed.  Lawrence [name] 12/03/2019, 11:06 AM

## 2019-12-03 NOTE — Progress Notes (Signed)
[id number] [place], [address] AT [address] [place] [address] Phone: [phone] Fax: [phone]      Your procedure is scheduled on December 08, 2019.  Report to [facility] Main Entrance "A" at 8:45 A.M., and check in at the Admitting office.  Call this number if you have problems the morning of surgery:  [phone]  Call [phone] if you have any questions prior to your surgery date Monday-Friday 8am-4pm    Remember:  Do not eat after midnight the night before your surgery  You may drink clear liquids until 7:45 A.M.  the morning of your surgery.   Clear liquids allowed are: Water, Non-Citrus Juices (without pulp), Carbonated Beverages, Clear Tea, Black Coffee Only, and Gatorade  Please complete your water that was provided to you by 7:45 A.M.  the morning of surgery.  Please, if able, drink it in one setting. DO NOT SIP.    Take these medicines the morning of surgery with A SIP OF WATER: Colchicine Ipratropium-Albuterol (COMBIVENT RESPIMAT) - as needed nitroGLYCERIN (NITROSTAT) - as needed metoprolol tartrate (LOPRESSOR) pantoprazole (PROTONIX) Oxcarbazepine (TRILEPTAL)  As of today, STOP taking any Aspirin (unless otherwise instructed by your surgeon), Aleve, Naproxen, Ibuprofen, Motrin, Advil, Goody's, BC's, all herbal medications, fish oil, and all vitamins.  Follow your surgeon's instructions on when to stop Eliquis.  If no instructions were given by your surgeon then you will need to call the office to get those instructions.     WHAT DO I DO ABOUT MY DIABETES MEDICATION?   Marland Kitchen Do not take oral diabetes medicines (pills) the morning of surgery.  . The day of surgery, do not take other diabetes injectables, including Byetta (exenatide), Bydureon (exenatide ER), Victoza (liraglutide), or Trulicity (dulaglutide).   HOW TO MANAGE YOUR DIABETES BEFORE AND AFTER SURGERY  Why is it  important to control my blood sugar before and after surgery? . Improving blood sugar levels before and after surgery helps healing and can limit problems. . A way of improving blood sugar control is eating a healthy diet by: o  Eating less sugar and carbohydrates o  Increasing activity/exercise o  Talking with your doctor about reaching your blood sugar goals . High blood sugars (greater than 180 mg/dL) can raise your risk of infections and slow your recovery, so you will need to focus on controlling your diabetes during the weeks before surgery. . Make sure that the doctor who takes care of your diabetes knows about your planned surgery including the date and location.  How do I manage my blood sugar before surgery? . Check your blood sugar at least 4 times a day, starting 2 days before surgery, to make sure that the level is not too high or low. . Check your blood sugar the morning of your surgery when you wake up and every 2 hours until you get to the Short Stay unit. o If your blood sugar is less than 70 mg/dL, you will need to treat for low blood sugar: - Do not take insulin. - Treat a low blood sugar (less than 70 mg/dL) with  cup of clear juice (cranberry or apple), 4 glucose tablets, OR glucose gel. - Recheck blood sugar in 15 minutes after treatment (to make sure it is greater than 70 mg/dL). If your blood sugar is not greater than 70 mg/dL on recheck, call [phone] for further instructions. . Report your blood  sugar to the short stay nurse when you get to Short Stay.  . If you are admitted to the hospital after surgery: o Your blood sugar will be checked by the staff and you will probably be given insulin after surgery (instead of oral diabetes medicines) to make sure you have good blood sugar levels. o The goal for blood sugar control after surgery is 80-180 mg/dL.   The Morning of Surgery  Do not wear jewelry, make-up or nail polish.  Do not wear lotions, powders, or  perfumes/colognes, or deodorant  Do not shave 48 hours prior to surgery.  Men may shave face and neck.  Do not bring valuables to the hospital.  [facility] is not responsible for any belongings or valuables.  If you are a smoker, DO NOT Smoke 24 hours prior to surgery  If you wear a CPAP at night please bring your mask, tubing, and machine the morning of surgery   Remember that you must have someone to transport you home after your surgery, and remain with you for 24 hours if you are discharged the same day.   Please bring cases for contacts, glasses, hearing aids, dentures or bridgework because it cannot be worn into surgery.    Leave your suitcase in the car.  After surgery it may be brought to your room.  For patients admitted to the hospital, discharge time will be determined by your treatment team.  Patients discharged the day of surgery will not be allowed to drive home.    Special instructions:   [place]- Preparing For Surgery  Before surgery, you can play an important role. Because skin is not sterile, your skin needs to be as free of germs as possible. You can reduce the number of germs on your skin by washing with CHG (chlorahexidine gluconate) Soap before surgery.  CHG is an antiseptic cleaner which kills germs and bonds with the skin to continue killing germs even after washing.    Oral Hygiene is also important to reduce your risk of infection.  Remember - BRUSH YOUR TEETH THE MORNING OF SURGERY WITH YOUR REGULAR TOOTHPASTE  Please do not use if you have an allergy to CHG or antibacterial soaps. If your skin becomes reddened/irritated stop using the CHG.  Do not shave (including legs and underarms) for at least 48 hours prior to first CHG shower. It is OK to shave your face.  Please follow these instructions carefully.   1. Shower the NIGHT BEFORE SURGERY and the MORNING OF SURGERY with CHG Soap.   2. If you chose to wash your hair, wash your hair first as usual  with your normal shampoo.  3. After you shampoo, rinse your hair and body thoroughly to remove the shampoo.  4. Use CHG as you would any other liquid soap. You can apply CHG directly to the skin and wash gently with a scrungie or a clean washcloth.   5. Apply the CHG Soap to your body ONLY FROM THE NECK DOWN.  Do not use on open wounds or open sores. Avoid contact with your eyes, ears, mouth and genitals (private parts). Wash Face and genitals (private parts)  with your normal soap.   6. Wash thoroughly, paying special attention to the area where your surgery will be performed.  7. Thoroughly rinse your body with warm water from the neck down.  8. DO NOT shower/wash with your normal soap after using and rinsing off the CHG Soap.  9. Pat yourself dry  with a CLEAN TOWEL.  10. Wear CLEAN PAJAMAS to bed the night before surgery, wear comfortable clothes the morning of surgery  11. Place CLEAN SHEETS on your bed the night of your first shower and DO NOT SLEEP WITH PETS.    Day of Surgery:  Please shower the morning of surgery with the CHG soap Do not apply any deodorants/lotions. Please wear clean clothes to the hospital/surgery center.   Remember to brush your teeth WITH YOUR REGULAR TOOTHPASTE.   Please read over the following fact sheets that you were given.

## 2019-12-04 ENCOUNTER — Encounter (HOSPITAL_COMMUNITY)
Admission: RE | Admit: 2019-12-04 | Discharge: 2019-12-04 | Disposition: A | Discharge status: Home / Self Care | Payer: Medicare Other | Source: Ambulatory Visit | Attending: Surgery | Admitting: Surgery

## 2019-12-04 ENCOUNTER — Other Ambulatory Visit: Payer: Self-pay

## 2019-12-04 ENCOUNTER — Encounter (HOSPITAL_COMMUNITY): Payer: Self-pay

## 2019-12-04 ENCOUNTER — Other Ambulatory Visit (HOSPITAL_COMMUNITY)
Admission: RE | Admit: 2019-12-04 | Discharge: 2019-12-04 | Disposition: A | Discharge status: Home / Self Care | Payer: Medicare Other | Source: Ambulatory Visit | Attending: Surgery | Admitting: Surgery

## 2019-12-04 ENCOUNTER — Encounter (HOSPITAL_COMMUNITY): Payer: Medicare Other

## 2019-12-04 DIAGNOSIS — Z7901 Long term (current) use of anticoagulants: Secondary | ICD-10-CM | POA: Diagnosis not present

## 2019-12-04 DIAGNOSIS — Z20828 Contact with and (suspected) exposure to other viral communicable diseases: Secondary | ICD-10-CM | POA: Insufficient documentation

## 2019-12-04 DIAGNOSIS — I2510 Atherosclerotic heart disease of native coronary artery without angina pectoris: Secondary | ICD-10-CM | POA: Insufficient documentation

## 2019-12-04 DIAGNOSIS — Z951 Presence of aortocoronary bypass graft: Secondary | ICD-10-CM | POA: Diagnosis not present

## 2019-12-04 DIAGNOSIS — Z950 Presence of cardiac pacemaker: Secondary | ICD-10-CM | POA: Insufficient documentation

## 2019-12-04 DIAGNOSIS — Z01812 Encounter for preprocedural laboratory examination: Secondary | ICD-10-CM | POA: Insufficient documentation

## 2019-12-04 HISTORY — DX: Presence of cardiac pacemaker: Z95.0

## 2019-12-04 HISTORY — DX: Unspecified hearing loss, unspecified ear: H91.90

## 2019-12-04 HISTORY — DX: Pneumonia, unspecified organism: J18.9

## 2019-12-04 HISTORY — DX: Unspecified osteoarthritis, unspecified site: M19.90

## 2019-12-04 HISTORY — DX: Unspecified cataract: H26.9

## 2019-12-04 LAB — CBC WITH DIFFERENTIAL/PLATELET
Abs Immature Granulocytes: 0.03 10*3/uL (ref 0.00–0.07)
Basophils Absolute: 0.1 10*3/uL (ref 0.0–0.1)
Basophils Relative: 1 %
Eosinophils Absolute: 0.1 10*3/uL (ref 0.0–0.5)
Eosinophils Relative: 1 %
HCT: 36 % — ABNORMAL LOW (ref 39.0–52.0)
Hemoglobin: 11.5 g/dL — ABNORMAL LOW (ref 13.0–17.0)
Immature Granulocytes: 0 %
Lymphocytes Relative: 12 %
Lymphs Abs: 1.1 10*3/uL (ref 0.7–4.0)
MCH: 31.1 pg (ref 26.0–34.0)
MCHC: 31.9 g/dL (ref 30.0–36.0)
MCV: 97.3 fL (ref 80.0–100.0)
Monocytes Absolute: 0.9 10*3/uL (ref 0.1–1.0)
Monocytes Relative: 10 %
Neutro Abs: 6.6 10*3/uL (ref 1.7–7.7)
Neutrophils Relative %: 76 %
Platelets: 208 10*3/uL (ref 150–400)
RBC: 3.7 MIL/uL — ABNORMAL LOW (ref 4.22–5.81)
RDW: 13.2 % (ref 11.5–15.5)
WBC: 8.8 10*3/uL (ref 4.0–10.5)
nRBC: 0 % (ref 0.0–0.2)

## 2019-12-04 LAB — COMPREHENSIVE METABOLIC PANEL
ALT: 26 U/L (ref 0–44)
AST: 24 U/L (ref 15–41)
Albumin: 3.6 g/dL (ref 3.5–5.0)
Alkaline Phosphatase: 198 U/L — ABNORMAL HIGH (ref 38–126)
Anion gap: 10 (ref 5–15)
BUN: 24 mg/dL — ABNORMAL HIGH (ref 8–23)
CO2: 22 mmol/L (ref 22–32)
Calcium: 8.8 mg/dL — ABNORMAL LOW (ref 8.9–10.3)
Chloride: 107 mmol/L (ref 98–111)
Creatinine, Ser: 1.34 mg/dL — ABNORMAL HIGH (ref 0.61–1.24)
GFR calc Af Amer: >60 mL/min (ref >60)
GFR calc non Af Amer: 52 mL/min — ABNORMAL LOW (ref >60)
Glucose, Bld: 169 mg/dL — ABNORMAL HIGH (ref 70–99)
Potassium: 4.1 mmol/L (ref 3.5–5.1)
Sodium: 139 mmol/L (ref 135–145)
Total Bilirubin: 0.9 mg/dL (ref 0.3–1.2)
Total Protein: 7 g/dL (ref 6.5–8.1)

## 2019-12-04 LAB — GLUCOSE, CAPILLARY: Glucose-Capillary: 158 mg/dL — ABNORMAL HIGH (ref 70–99)

## 2019-12-04 NOTE — Progress Notes (Signed)
Patient denies shortness of breath, fever, cough and chest pain.  PCP - Dr Lovette Cliche Cardiologist - Dr [name]  Chest x-ray - 07/29/18, 1 view EKG - 05/11/19 Stress Test - 12/02/19 ECHO - 12/03/19 Cardiac Cath - denies  ICD Pacemaker St Jude , PPM faxed to device clinic.  [place] informed [phone].  Eliquis last dose on 12/04/19.  Sleep Study - Yes CPAP - Does not use CPAP in the last 3 months.  Fasting Blood Sugar - 130-150s  Checks Blood Sugar _3-4 times per week  ERAS: Clears til 0745 DOS, no drink.  Anesthesia review: Yes  Coronavirus Screening Have you experienced the following symptoms:  Cough yes/no: No Fever (>100.42F)  yes/no: No Runny nose yes/no: No Sore throat yes/no: No Difficulty breathing/shortness of breath  yes/no: No  Have you traveled in the last 14 days and where? yes/no: No  Patient verbalized understanding of instructions that were given to them at the PAT appointment.

## 2019-12-05 LAB — NOVEL CORONAVIRUS, NAA (HOSP ORDER, SEND-OUT TO REF LAB; TAT 18-24 HRS): SARS-CoV-2, NAA: NOT DETECTED

## 2019-12-07 ENCOUNTER — Other Ambulatory Visit (HOSPITAL_BASED_OUTPATIENT_CLINIC_OR_DEPARTMENT_OTHER): Payer: Medicare Other

## 2019-12-07 NOTE — Anesthesia Preprocedure Evaluation (Addendum)
Anesthesia Evaluation  Patient identified by MRN, date of birth, ID band Patient awake    Reviewed: Allergy & Precautions, NPO status , Patient's Chart, lab work & pertinent test results  Airway Mallampati: II  TM Distance: >3 FB Neck ROM: Full    Dental   Pulmonary sleep apnea , COPD, former smoker,    Pulmonary exam normal        Cardiovascular hypertension, Pt. on medications Normal cardiovascular exam+ pacemaker      Neuro/Psych Anxiety    GI/Hepatic GERD  Medicated and Controlled,  Endo/Other  diabetes, Type 2, Oral Hypoglycemic Agents  Renal/GU Renal InsufficiencyRenal disease     Musculoskeletal   Abdominal   Peds  Hematology   Anesthesia Other Findings   Reproductive/Obstetrics                            Anesthesia Physical Anesthesia Plan  ASA: III  Anesthesia Plan: General   Post-op Pain Management:    Induction: Intravenous  PONV Risk Score and Plan: 2 and Ondansetron, Midazolam and Treatment may vary due to age or medical condition  Airway Management Planned: Oral ETT  Additional Equipment:   Intra-op Plan:   Post-operative Plan: Extubation in OR  Informed Consent: I have reviewed the patients History and Physical, chart, labs and discussed the procedure including the risks, benefits and alternatives for the proposed anesthesia with the patient or authorized representative who has indicated his/her understanding and acceptance.       Plan Discussed with: CRNA and Surgeon  Anesthesia Plan Comments: (Pt follows with cardiology for hx of CAD s/p CABG 2007, cardiomyopathy with recovered LVEF. He was seen by Dr. [name] 11/30/19 for preop clearance. He ordered Lexiscan and echo for preop risk stratification. Echo showed EF 50-55%, grade II dd, mild-mod TR. Lexiscan was low risk with normal perfusion and normal left ventricular regional and global systolic function. Pt  was instructed to hold eliquis 48hr prior to surgery.   Follows with EP cardiology for hx of SSS s/p St Jude dual chamber PPM, paroxysmal afib s/o successful ablation 02/05/19. Last seen by Dr. Curt Bears 04/27/19. At that time he had no further episodes of afib s/p ablation. 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  EP device form has been faxed to clinic.   Preop labs reviewed. Pt with hx of renal insufficiency, creatinine 1.34. Mild Anemia Hgb 11.5.  EKG 05/11/19: Atrial paced rhythm with prolonged AV conduction. Rate 60.   TTE 12/03/19: 1. Left ventricular ejection fraction, by visual estimation, is 50 to 55%. The left ventricle has normal function. Left ventricular septal wall thickness was mildly increased. Mildly increased left ventricular posterior wall thickness. There is mildly  increased left ventricular hypertrophy.  2. Left ventricular diastolic parameters are consistent with Grade II diastolic dysfunction (pseudonormalization).  3. The left ventricle has no regional wall motion abnormalities.  4. Global right ventricle has normal systolic function.The right ventricular size is normal. No increase in right ventricular wall thickness.  5. Left atrial size was normal.  6. Right atrial size was moderately dilated.  7. The mitral valve is normal in structure. Mild mitral valve regurgitation. No evidence of mitral stenosis.  8. The tricuspid valve is normal in structure. Tricuspid valve regurgitation mild-moderate.  9. The aortic valve is abnormal. There is focal calcification on the tips of the noncoronary cusp. Aortic valve regurgitation is not visualized. No evidence of aortic valve  sclerosis or stenosis. 10. The pulmonic valve was not well visualized. Pulmonic valve regurgitation is trivial. 11. A pacer wire is visualized. 12. There is mild dilatation of the ascending aorta measuring 38 mm. 13. Mildly elevated pulmonary artery systolic pressure.  Nuclear stress  12/02/19:  Nuclear stress EF: 59%.  There was no ST segment deviation noted during stress.  No T wave inversion was noted during stress.  The study is normal.  This is a low risk study.   Low risk stress nuclear study with normal perfusion and normal left ventricular regional and global systolic function.)       Anesthesia Quick Evaluation

## 2019-12-07 NOTE — Progress Notes (Addendum)
Anesthesia Chart Review:  Pt follows with cardiology for hx of CAD s/p CABG 2007, cardiomyopathy with recovered LVEF. He was seen by Dr. [name] 11/30/19 for preop clearance. He ordered Lexiscan and echo for preop risk stratification. Echo showed EF 50-55%, grade II dd, mild-mod TR. Lexiscan was low risk with normal perfusion and normal left ventricular regional and global systolic function. Pt was instructed to hold eliquis 48hr prior to surgery.   Follows with EP cardiology for hx of SSS s/p [employer] Jude dual chamber PPM, paroxysmal afib s/o successful ablation 02/05/19. Last seen by Dr. Curt Bears 04/27/19. At that time he had no further episodes of afib s/p ablation. 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  EP device form has been faxed to clinic.   Preop labs reviewed. Pt with hx of renal insufficiency, creatinine 1.34. Mild Anemia Hgb 11.5.  EKG 05/11/19: Atrial paced rhythm with prolonged AV conduction. Rate 60.   TTE 12/03/19: 1. Left ventricular ejection fraction, by visual estimation, is 50 to 55%. The left ventricle has normal function. Left ventricular septal wall thickness was mildly increased. Mildly increased left ventricular posterior wall thickness. There is mildly  increased left ventricular hypertrophy.  2. Left ventricular diastolic parameters are consistent with Grade II diastolic dysfunction (pseudonormalization).  3. The left ventricle has no regional wall motion abnormalities.  4. Global right ventricle has normal systolic function.The right ventricular size is normal. No increase in right ventricular wall thickness.  5. Left atrial size was normal.  6. Right atrial size was moderately dilated.  7. The mitral valve is normal in structure. Mild mitral valve regurgitation. No evidence of mitral stenosis.  8. The tricuspid valve is normal in structure. Tricuspid valve regurgitation mild-moderate.  9. The aortic valve is abnormal. There is focal calcification  on the tips of the noncoronary cusp. Aortic valve regurgitation is not visualized. No evidence of aortic valve sclerosis or stenosis. 10. The pulmonic valve was not well visualized. Pulmonic valve regurgitation is trivial. 11. A pacer wire is visualized. 12. There is mild dilatation of the ascending aorta measuring 38 mm. 13. Mildly elevated pulmonary artery systolic pressure.   Nuclear stress 12/02/19:  Nuclear stress EF: 59%.  There was no ST segment deviation noted during stress.  No T wave inversion was noted during stress.  The study is normal.  This is a low risk study.   Low risk stress nuclear study with normal perfusion and normal left ventricular regional and global systolic function.   Wynonia Musty [facility] Stay [facility]/Anesthesiology Phone [phone] 12/07/2019 9:42 AM

## 2019-12-08 ENCOUNTER — Encounter (HOSPITAL_COMMUNITY)
Admission: RE | Disposition: A | Discharge status: Home / Self Care | Payer: Self-pay | Source: Home / Self Care | Attending: Surgery

## 2019-12-08 ENCOUNTER — Ambulatory Visit (HOSPITAL_COMMUNITY): Payer: Medicare Other | Admitting: Certified Registered"

## 2019-12-08 ENCOUNTER — Other Ambulatory Visit: Payer: Self-pay

## 2019-12-08 ENCOUNTER — Encounter (HOSPITAL_COMMUNITY): Payer: Self-pay | Admitting: Surgery

## 2019-12-08 ENCOUNTER — Ambulatory Visit (HOSPITAL_COMMUNITY)
Admission: RE | Admit: 2019-12-08 | Discharge: 2019-12-08 | Disposition: A | Discharge status: Home / Self Care | Payer: Medicare Other | Attending: Surgery | Admitting: Surgery

## 2019-12-08 ENCOUNTER — Ambulatory Visit (HOSPITAL_COMMUNITY): Payer: Medicare Other | Admitting: Physician Assistant

## 2019-12-08 DIAGNOSIS — Z7901 Long term (current) use of anticoagulants: Secondary | ICD-10-CM | POA: Insufficient documentation

## 2019-12-08 DIAGNOSIS — Z794 Long term (current) use of insulin: Secondary | ICD-10-CM | POA: Insufficient documentation

## 2019-12-08 DIAGNOSIS — K4090 Unilateral inguinal hernia, without obstruction or gangrene, not specified as recurrent: Secondary | ICD-10-CM | POA: Insufficient documentation

## 2019-12-08 DIAGNOSIS — K219 Gastro-esophageal reflux disease without esophagitis: Secondary | ICD-10-CM | POA: Diagnosis not present

## 2019-12-08 DIAGNOSIS — Z79899 Other long term (current) drug therapy: Secondary | ICD-10-CM | POA: Insufficient documentation

## 2019-12-08 DIAGNOSIS — K432 Incisional hernia without obstruction or gangrene: Secondary | ICD-10-CM | POA: Insufficient documentation

## 2019-12-08 DIAGNOSIS — J449 Chronic obstructive pulmonary disease, unspecified: Secondary | ICD-10-CM | POA: Diagnosis not present

## 2019-12-08 DIAGNOSIS — E119 Type 2 diabetes mellitus without complications: Secondary | ICD-10-CM | POA: Insufficient documentation

## 2019-12-08 DIAGNOSIS — I10 Essential (primary) hypertension: Secondary | ICD-10-CM | POA: Diagnosis not present

## 2019-12-08 DIAGNOSIS — Z87891 Personal history of nicotine dependence: Secondary | ICD-10-CM | POA: Diagnosis not present

## 2019-12-08 DIAGNOSIS — Z950 Presence of cardiac pacemaker: Secondary | ICD-10-CM | POA: Insufficient documentation

## 2019-12-08 DIAGNOSIS — G4730 Sleep apnea, unspecified: Secondary | ICD-10-CM | POA: Diagnosis not present

## 2019-12-08 HISTORY — PX: INGUINAL HERNIA REPAIR: SHX194

## 2019-12-08 HISTORY — PX: INCISIONAL HERNIA REPAIR: SHX193

## 2019-12-08 LAB — GLUCOSE, CAPILLARY
Glucose-Capillary: 177 mg/dL — ABNORMAL HIGH (ref 70–99)
Glucose-Capillary: 157 mg/dL — ABNORMAL HIGH (ref 70–99)
Glucose-Capillary: 191 mg/dL — ABNORMAL HIGH (ref 70–99)

## 2019-12-08 LAB — PROTIME-INR
INR: 1.1 (ref 0.8–1.2)
Prothrombin Time: 14.2 seconds (ref 11.4–15.2)

## 2019-12-08 SURGERY — REPAIR, HERNIA, INCISIONAL
Anesthesia: General | Site: Groin

## 2019-12-08 MED ORDER — LACTATED RINGERS IV SOLN: INTRAVENOUS | Status: DC

## 2019-12-08 MED ORDER — SUGAMMADEX SODIUM 200 MG/2ML IV SOLN
INTRAVENOUS | Status: DC | PRN
  Administered 2019-12-08: 200 mg via INTRAVENOUS

## 2019-12-08 MED ORDER — 0.9 % SODIUM CHLORIDE (POUR BTL) OPTIME
TOPICAL | Status: DC | PRN
  Administered 2019-12-08: 1000 mL

## 2019-12-08 MED ORDER — EPHEDRINE SULFATE-NACL 50-0.9 MG/10ML-% IV SOSY
PREFILLED_SYRINGE | INTRAVENOUS | Status: DC | PRN
  Administered 2019-12-08 (×2): 15 mg via INTRAVENOUS
  Administered 2019-12-08: 10 mg via INTRAVENOUS

## 2019-12-08 MED ORDER — CHLORHEXIDINE GLUCONATE CLOTH 2 % EX PADS: 6.0000 | MEDICATED_PAD | Freq: Once | CUTANEOUS | Status: DC

## 2019-12-08 MED ORDER — CEFAZOLIN SODIUM-DEXTROSE 2-4 GM/100ML-% IV SOLN
2.0000 g | INTRAVENOUS | Status: AC
  Administered 2019-12-08: 2 g via INTRAVENOUS

## 2019-12-08 MED ORDER — FENTANYL CITRATE (PF) 250 MCG/5ML IJ SOLN
INTRAMUSCULAR | Status: AC
  Filled 2019-12-08: qty 5

## 2019-12-08 MED ORDER — ONDANSETRON HCL 4 MG/2ML IJ SOLN
INTRAMUSCULAR | Status: AC
  Filled 2019-12-08: qty 2

## 2019-12-08 MED ORDER — ROCURONIUM BROMIDE 10 MG/ML (PF) SYRINGE
PREFILLED_SYRINGE | INTRAVENOUS | Status: AC
  Filled 2019-12-08: qty 10

## 2019-12-08 MED ORDER — FENTANYL CITRATE (PF) 100 MCG/2ML IJ SOLN
INTRAMUSCULAR | Status: AC
  Filled 2019-12-08: qty 2

## 2019-12-08 MED ORDER — DEXAMETHASONE SODIUM PHOSPHATE 10 MG/ML IJ SOLN
INTRAMUSCULAR | Status: AC
  Filled 2019-12-08: qty 1

## 2019-12-08 MED ORDER — MEPERIDINE HCL 25 MG/ML IJ SOLN: 6.2500 mg | INTRAMUSCULAR | Status: DC | PRN

## 2019-12-08 MED ORDER — GABAPENTIN 300 MG PO CAPS
ORAL_CAPSULE | ORAL | Status: AC
  Administered 2019-12-08: 09:00:00 300 mg via ORAL
  Filled 2019-12-08: qty 1

## 2019-12-08 MED ORDER — FENTANYL CITRATE (PF) 100 MCG/2ML IJ SOLN
INTRAMUSCULAR | Status: DC | PRN
  Administered 2019-12-08: 50 ug via INTRAVENOUS
  Administered 2019-12-08: 100 ug via INTRAVENOUS
  Administered 2019-12-08 (×2): 50 ug via INTRAVENOUS

## 2019-12-08 MED ORDER — BUPIVACAINE-EPINEPHRINE 0.5% -1:200000 IJ SOLN
INTRAMUSCULAR | Status: AC
  Filled 2019-12-08: qty 1

## 2019-12-08 MED ORDER — PHENYLEPHRINE HCL-NACL 10-0.9 MG/250ML-% IV SOLN
INTRAVENOUS | Status: DC | PRN
  Administered 2019-12-08: 25 ug/min via INTRAVENOUS

## 2019-12-08 MED ORDER — CEFAZOLIN SODIUM-DEXTROSE 2-4 GM/100ML-% IV SOLN
INTRAVENOUS | Status: AC
  Filled 2019-12-08: qty 100

## 2019-12-08 MED ORDER — LIDOCAINE 2% (20 MG/ML) 5 ML SYRINGE
INTRAMUSCULAR | Status: DC | PRN
  Administered 2019-12-08: 100 mg via INTRAVENOUS

## 2019-12-08 MED ORDER — LIDOCAINE 2% (20 MG/ML) 5 ML SYRINGE
INTRAMUSCULAR | Status: AC
  Filled 2019-12-08: qty 5

## 2019-12-08 MED ORDER — PHENYLEPHRINE 40 MCG/ML (10ML) SYRINGE FOR IV PUSH (FOR BLOOD PRESSURE SUPPORT)
PREFILLED_SYRINGE | INTRAVENOUS | Status: DC | PRN
  Administered 2019-12-08: 200 ug via INTRAVENOUS

## 2019-12-08 MED ORDER — HYDROMORPHONE HCL 1 MG/ML IJ SOLN: 0.2500 mg | INTRAMUSCULAR | Status: DC | PRN

## 2019-12-08 MED ORDER — OXYCODONE HCL 5 MG PO TABS: 5.0000 mg | ORAL_TABLET | Freq: Four times a day (QID) | ORAL | 0 refills | Status: DC | PRN

## 2019-12-08 MED ORDER — PROPOFOL 10 MG/ML IV BOLUS
INTRAVENOUS | Status: AC
  Filled 2019-12-08: qty 20

## 2019-12-08 MED ORDER — BUPIVACAINE-EPINEPHRINE 0.25% -1:200000 IJ SOLN
INTRAMUSCULAR | Status: DC | PRN
  Administered 2019-12-08: 49 mL

## 2019-12-08 MED ORDER — ONDANSETRON HCL 4 MG/2ML IJ SOLN
4.0000 mg | Freq: Once | INTRAMUSCULAR | Status: AC | PRN
  Administered 2019-12-08: 13:00:00 4 mg via INTRAVENOUS

## 2019-12-08 MED ORDER — MIDAZOLAM HCL 2 MG/2ML IJ SOLN
INTRAMUSCULAR | Status: AC
  Filled 2019-12-08: qty 2

## 2019-12-08 MED ORDER — DEXAMETHASONE SODIUM PHOSPHATE 10 MG/ML IJ SOLN
INTRAMUSCULAR | Status: DC | PRN
  Administered 2019-12-08: 10 mg via INTRAVENOUS

## 2019-12-08 MED ORDER — ROCURONIUM BROMIDE 50 MG/5ML IV SOSY
PREFILLED_SYRINGE | INTRAVENOUS | Status: DC | PRN
  Administered 2019-12-08: 90 mg via INTRAVENOUS

## 2019-12-08 MED ORDER — GABAPENTIN 300 MG PO CAPS: 300.0000 mg | ORAL_CAPSULE | ORAL | Status: AC

## 2019-12-08 MED ORDER — PROPOFOL 10 MG/ML IV BOLUS
INTRAVENOUS | Status: DC | PRN
  Administered 2019-12-08: 110 mg via INTRAVENOUS

## 2019-12-08 SURGICAL SUPPLY — 62 items
ADH SKN CLS APL DERMABOND .7 (GAUZE/BANDAGES/DRESSINGS) ×2
APL PRP STRL LF DISP 70% ISPRP (MISCELLANEOUS) ×2
BINDER ABDOMINAL 12 ML 46-62 (SOFTGOODS) ×2 IMPLANT
BIOPATCH RED 1 DISK 7.0 (GAUZE/BANDAGES/DRESSINGS) ×1 IMPLANT
BIOPATCH RED 1IN DISK 7.0MM (GAUZE/BANDAGES/DRESSINGS) ×1
BLADE CLIPPER SURG (BLADE) IMPLANT
CANISTER SUCT 3000ML PPV (MISCELLANEOUS) ×4 IMPLANT
CHLORAPREP W/TINT 26 (MISCELLANEOUS) ×4 IMPLANT
COVER SURGICAL LIGHT HANDLE (MISCELLANEOUS) ×4 IMPLANT
COVER WAND RF STERILE (DRAPES) ×2 IMPLANT
DERMABOND ADVANCED (GAUZE/BANDAGES/DRESSINGS) ×2
DERMABOND ADVANCED .7 DNX12 (GAUZE/BANDAGES/DRESSINGS) ×2 IMPLANT
DRAIN CHANNEL 19F RND (DRAIN) ×2 IMPLANT
DRAIN PENROSE 1/2X12 LTX STRL (WOUND CARE) ×2 IMPLANT
DRAPE LAPAROSCOPIC ABDOMINAL (DRAPES) ×4 IMPLANT
DRAPE LAPAROTOMY TRNSV 102X78 (DRAPES) ×2 IMPLANT
DRSG PAD ABDOMINAL 8X10 ST (GAUZE/BANDAGES/DRESSINGS) IMPLANT
DRSG TEGADERM 4X4.75 (GAUZE/BANDAGES/DRESSINGS) ×2 IMPLANT
ELECT CAUTERY BLADE 6.4 (BLADE) ×4 IMPLANT
ELECT REM PT RETURN 9FT ADLT (ELECTROSURGICAL) ×4
ELECTRODE REM PT RTRN 9FT ADLT (ELECTROSURGICAL) ×2 IMPLANT
EVACUATOR SILICONE 100CC (DRAIN) ×2 IMPLANT
GAUZE SPONGE 4X4 12PLY STRL (GAUZE/BANDAGES/DRESSINGS) ×2 IMPLANT
GLOVE BIO SURGEON STRL SZ8 (GLOVE) ×4 IMPLANT
GLOVE BIOGEL PI IND STRL 8 (GLOVE) ×2 IMPLANT
GLOVE BIOGEL PI INDICATOR 8 (GLOVE) ×2
GOWN STRL REUS W/ TWL LRG LVL3 (GOWN DISPOSABLE) ×2 IMPLANT
GOWN STRL REUS W/ TWL XL LVL3 (GOWN DISPOSABLE) ×2 IMPLANT
GOWN STRL REUS W/TWL LRG LVL3 (GOWN DISPOSABLE) ×12
GOWN STRL REUS W/TWL XL LVL3 (GOWN DISPOSABLE) ×4
KIT BASIN OR (CUSTOM PROCEDURE TRAY) ×4 IMPLANT
KIT TURNOVER KIT B (KITS) ×4 IMPLANT
MARKER SKIN DUAL TIP RULER LAB (MISCELLANEOUS) ×2 IMPLANT
MESH HERNIA SYS ULTRAPRO LRG (Mesh General) ×2 IMPLANT
MESH VENTRALEX ST 8CM LRG (Mesh General) ×2 IMPLANT
NDL HYPO 25GX1X1/2 BEV (NEEDLE) ×2 IMPLANT
NEEDLE HYPO 25GX1X1/2 BEV (NEEDLE) ×4 IMPLANT
NS IRRIG 1000ML POUR BTL (IV SOLUTION) ×4 IMPLANT
PACK GENERAL/GYN (CUSTOM PROCEDURE TRAY) ×4 IMPLANT
PAD ABD 8X10 STRL (GAUZE/BANDAGES/DRESSINGS) ×2 IMPLANT
PAD ARMBOARD 7.5X6 YLW CONV (MISCELLANEOUS) ×4 IMPLANT
PENCIL SMOKE EVACUATOR (MISCELLANEOUS) ×4 IMPLANT
STAPLER VISISTAT 35W (STAPLE) IMPLANT
SUT ETHILON 2 0 FS 18 (SUTURE) ×2 IMPLANT
SUT MNCRL AB 4-0 PS2 18 (SUTURE) ×6 IMPLANT
SUT MON AB 4-0 PC3 18 (SUTURE) IMPLANT
SUT NOVA NAB DX-16 0-1 5-0 T12 (SUTURE) ×12 IMPLANT
SUT PDS AB 1 CTX 36 (SUTURE) ×4 IMPLANT
SUT SILK 2 0 SH (SUTURE) IMPLANT
SUT VIC AB 0 CT1 27 (SUTURE)
SUT VIC AB 0 CT1 27XBRD ANBCTR (SUTURE) IMPLANT
SUT VIC AB 2-0 SH 27 (SUTURE) ×4
SUT VIC AB 2-0 SH 27X BRD (SUTURE) ×2 IMPLANT
SUT VIC AB 2-0 SH 27XBRD (SUTURE) IMPLANT
SUT VIC AB 3-0 SH 18 (SUTURE) ×4 IMPLANT
SUT VIC AB 3-0 SH 27 (SUTURE) ×8
SUT VIC AB 3-0 SH 27X BRD (SUTURE) IMPLANT
SUT VICRYL AB 3 0 TIES (SUTURE) ×2 IMPLANT
SYR CONTROL 10ML LL (SYRINGE) ×4 IMPLANT
TOWEL GREEN STERILE (TOWEL DISPOSABLE) ×2 IMPLANT
TOWEL GREEN STERILE FF (TOWEL DISPOSABLE) ×4 IMPLANT
TRAY FOLEY MTR SLVR 16FR STAT (SET/KITS/TRAYS/PACK) IMPLANT

## 2019-12-08 NOTE — Interval H&P Note (Signed)
History and Physical Interval Note:  12/08/2019 10:24 AM  Lawrence [name]  has presented today for surgery, with the diagnosis of INCISIONAL HERNIA, LEFT INGUINAL HERNIA.  The various methods of treatment have been discussed with the patient and family. After consideration of risks, benefits and other options for treatment, the patient has consented to  Procedure(s): OPEN INCISIONAL HERNIA REPAIR WITH MESH (N/A) HERNIA REPAIR LEFT  INGUINAL ADULT WITH MESH (Left) as a surgical intervention.  The patient's history has been reviewed, patient examined, no change in status, stable for surgery.  I have reviewed the patient's chart and labs.  Questions were answered to the patient's satisfaction.     [place]

## 2019-12-08 NOTE — Anesthesia Procedure Notes (Signed)
Procedure Name: Intubation Date/Time: 12/08/2019 10:01 AM Performed by: Lance Coon, CRNA Pre-anesthesia Checklist: Patient identified, Emergency Drugs available, Suction available, Patient being monitored and Timeout performed Patient Re-evaluated:Patient Re-evaluated prior to induction Oxygen Delivery Method: Circle system utilized Preoxygenation: Pre-oxygenation with 100% oxygen Induction Type: IV induction Ventilation: Mask ventilation without difficulty Laryngoscope Size: Miller and 3 Grade View: Grade I Tube type: Oral Tube size: 7.5 mm Number of attempts: 1 Airway Equipment and Method: Stylet Placement Confirmation: ETT inserted through vocal cords under direct vision,  positive ETCO2 and breath sounds checked- equal and bilateral Secured at: 23 cm Tube secured with: Tape Dental Injury: Teeth and Oropharynx as per pre-operative assessment

## 2019-12-08 NOTE — Op Note (Signed)
Preoperative diagnosis: 7 cm reducible incisional hernia reducible left inguinal hernia  Postoperative diagnosis: Same  Procedure: #1 repair of incisional hernia with 8 cm coated Ventralex mesh and repair of left inguinal hernia with ultra Pro hernia system  Surgeon: Erroll Luna, MD  Anesthesia: General with local consisting of 0.5% Sensorcaine  EBL: 10 cc  Specimen: None  Drains: 19 round Jackson-Pratt drain to subcutaneous tissues overlying the previous incisional hernia  IV fluids: Per anesthesia record  Indications for procedure: The patient is a 73 year old male who last year underwent an emergent subtotal colectomy for ischemic colitis.  He survived after multiple his hospital in the ICU.  He returns with what appears to be an incisional hernia.  He is fully recovered from his event.  He also with CT scanning was found of a left inguinal hernia and was having symptoms from as well.  He was to have both repaired.  He was at an increased operative risk we discussed this preoperatively.  It was causing significant pain and limitation of his quality of life therefore repair was recommended with mesh.The risk of hernia repair include bleeding,  Infection,   Recurrence of the hernia,  Mesh use, chronic pain,  Organ injury,  Bowel injury,  Bladder injury,   nerve injury with numbness around the incision,  Death,  and worsening of preexisting  medical problems.  The alternatives to surgery have been discussed as well..  Long term expectations of both operative and non operative treatments have been discussed.   The patient agrees to proceed.   Description of procedure: The patient was met in the holding area.  Questions were answered.  Left inguinal region was marked as correct side vaginal hernia and also marked incisional hernia.  He was then taken back to room.  After induction of general esthesia the abdomen was prepped and draped in sterile fashion and timeout was performed.  Proper  patient, procedure and site of the left inguinal hernia were all verified.  The incisional hernia was performed first.  Incision was made in the superior aspect of an old laparotomy scar.  Dissection was carried down and a large hernia sac was encountered measuring about 7 cm.  This was dissected circumferentially out of the subcutaneous tissues down to the fascial edge.  Fascial defect was closed with a 5 cm in maximal diameter.  I then dissected up under the fascia and in the retrorectus muscle space developed a plane.  I was then able to place a circular ventral light coated mesh in the subfascial position under the rectus muscle.  This was secured circumferentially with transfascial sutures of #1 Novafil.  Once this was secured I was unable to close the fascia over with #1 Novafil.  There is good closure without any excessive tension on the repair.  The space was irrigated.  Through separate stab incision 19 round drain was placed into the subcutaneous tissue since he had a large hernia sac.  This wound was closed with 3-0 Vicryl and 4-0 Monocryl.  The left inguinal hernia was performed next.  An oblique incision was made left inguinal crease after infiltration with local anesthetic.  Dissection was carried down to the aponeurosis of the external oblique.  This was then injected with 0.5% Marcaine and then opened through the external ring.  The cord structures were encircled as well as the ilioinguinal nerve.  This was retracted on the field.  He had a large direct defect just at the pubic symphysis.  A  large ultra pro hernia system was used and the inner leaflet was placed into the direct defect and deployed.  The onlay was placed on the floor femoral canal.  A slit was cut for the cord structures.  There was then secured the shelving edge of the inguinal ligament, conjoined tendon in Cooper's ligament using #1 Novafil.  The cord structures were allowed to exit through a slit in the mesh.  There is no  significant undue tension in this area.  This laid flat without any constriction of the cord or the ilioinguinal nerve.  Fascia was then closed with 2-0 Vicryl.  3-0 Vicryl was used to approximate Scarpa's fascia and 4 Monocryl was used to close skin in a subcuticular fashion.  Dermabond applied to both incisions.  Abdominal binder placed.  All final counts were found to be correct.  The patient was awoke extubated taken recovery in satisfactory condition.  EBL:10 cc

## 2019-12-08 NOTE — Anesthesia Postprocedure Evaluation (Signed)
Anesthesia Post Note  Patient: Lawrence [name]  Procedure(s) Performed: OPEN INCISIONAL HERNIA REPAIR WITH MESH (N/A Abdomen) HERNIA REPAIR LEFT  INGUINAL ADULT WITH MESH (Left Groin)     Patient location during evaluation: PACU Anesthesia Type: General Level of consciousness: awake and alert Pain management: pain level controlled Vital Signs Assessment: post-procedure vital signs reviewed and stable Respiratory status: spontaneous breathing, nonlabored ventilation and respiratory function stable Cardiovascular status: blood pressure returned to baseline and stable Postop Assessment: no apparent nausea or vomiting Anesthetic complications: no    Last Vitals:  Vitals:   12/08/19 1330 12/08/19 1345  BP: 134/62 (!) 136/53  Pulse: 66 60  Resp: 19 18  Temp:    SpO2: 92% 93%    Last Pain:  Vitals:   12/08/19 1345  PainSc: 0-No pain                 Lidia Collum

## 2019-12-08 NOTE — Discharge Instructions (Signed)
Surgical [facility] Surgical drains are used to remove extra fluid that normally builds up in a surgical wound after surgery. A surgical drain helps to heal a surgical wound. Different kinds of surgical drains include:  Active drains. These drains use suction to pull drainage away from the surgical wound. Drainage flows through a tube to a container outside of the body. With these drains, you need to keep the bulb or the drainage container flat (compressed) at all times, except while you empty it. Flattening the bulb or container creates suction.  Passive drains. These drains allow fluid to drain naturally, by gravity. Drainage flows through a tube to a bandage (dressing) or a container outside of the body. Passive drains do not need to be emptied. A drain is placed during surgery. Right after surgery, drainage is usually bright red and a little thicker than water. The drainage may gradually turn yellow or pink and become thinner. It is likely that your health care provider will remove the drain when the drainage stops or when the amount decreases to 1-2 Tbsp (15-30 mL) during a 24-hour period. Supplies needed:  Tape.  Germ-free cleaning solution (sterile saline).  Cotton swabs.  Split gauze drain sponge: 4 x 4 inches (10 x 10 cm).  Gauze square: 4 x 4 inches (10 x 10 cm). How to care for your surgical drain Care for your drain as told by your health care provider. This is important to help prevent infection. If your drain is placed at your back, or any other hard-to-reach area, ask another person to assist you in performing the following tasks: General care  Keep the skin around the drain dry and covered with a dressing at all times.  Check your drain area every day for signs of infection. Check for: ? Redness, swelling, or pain. ? Pus or a bad smell. ? Cloudy drainage. ? Tenderness or pressure at the drain exit site. Changing the dressing Follow instructions from your health care  provider about how to change your dressing. Change your dressing at least once a day. Change it more often if needed to keep the dressing dry. Make sure you: 1. Gather your supplies. 2. Wash your hands with soap and water before you change your dressing. If soap and water are not available, use hand sanitizer. 3. Remove the old dressing. Avoid using scissors to do that. 4. Wash your hands with soap and water again after removing the old dressing. 5. Use sterile saline to clean your skin around the drain. You may need to use a cotton swab to clean the skin. 6. Place the tube through the slit in a drain sponge. Place the drain sponge so that it covers your wound. 7. Place the gauze square or another drain sponge on top of the drain sponge that is on the wound. Make sure the tube is between those layers. 8. Tape the dressing to your skin. 9. Tape the drainage tube to your skin 1-2 inches (2.5-5 cm) below the place where the tube enters your body. Taping keeps the tube from pulling on any stitches (sutures) that you have. 10. Wash your hands with soap and water. 11. Write down the color of your drainage and how often you change your dressing. How to empty your active drain  1. Make sure that you have a measuring cup that you can empty your drainage into. 2. Wash your hands with soap and water. If soap and water are not available, use hand sanitizer. 3.  Loosen any pins or clips that hold the tube in place. 4. If your health care provider tells you to strip the tube to prevent clots and tube blockages: ? Hold the tube at the skin with one hand. Use your other hand to pinch the tubing with your thumb and first finger. ? Gently move your fingers down the tube while squeezing very lightly. This clears any drainage, clots, or tissue from the tube. ? You may need to do this several times each day to keep the tube clear. Do not pull on the tube. 5. Open the bulb cap or the drain plug. Do not touch the  inside of the cap or the bottom of the plug. 6. Turn the device upside down and gently squeeze. 7. Empty all of the drainage into the measuring cup. 8. Compress the bulb or the container and replace the cap or the plug. To compress the bulb or the container, squeeze it firmly in the middle while you close the cap or plug the container. 9. Write down the amount of drainage that you have in each 24-hour period. If you have less than 2 Tbsp (30 mL) of drainage during 24 hours, contact your health care provider. 10. Flush the drainage down the toilet. 11. Wash your hands with soap and water. Contact a health care provider if:  You have redness, swelling, or pain around your drain area.  You have pus or a bad smell coming from your drain area.  You have a fever or chills.  The skin around your drain is warm to the touch.  The amount of drainage that you have is increasing instead of decreasing.  You have drainage that is cloudy.  There is a sudden stop or a sudden decrease in the amount of drainage that you have.  Your drain tube falls out.  Your active drain does not stay compressed after you empty it. Summary  Surgical drains are used to remove extra fluid that normally builds up in a surgical wound after surgery.  Different kinds of surgical drains include active drains and passive drains. Active drains use suction to pull drainage away from the surgical wound, and passive drains allow fluid to drain naturally.  It is important to care for your drain to prevent infection. If your drain is placed at your back, or any other hard-to-reach area, ask another person to assist you.  Contact your health care provider if you have redness, swelling, or pain around your drain area. This information is not intended to replace advice given to you by your health care provider. Make sure you discuss any questions you have with your health care provider. Document Released: 12/07/2000 Document  Revised: 01/14/2019 Document Reviewed: 01/14/2019 Elsevier Patient Education  2020 [place] _______[facility]  UMBILICAL OR INGUINAL HERNIA REPAIR: POST OP INSTRUCTIONS  Always review your discharge instruction sheet given to you by the facility where your surgery was performed. IF YOU HAVE DISABILITY OR FAMILY LEAVE FORMS, YOU MUST BRING THEM TO THE OFFICE FOR PROCESSING.   DO NOT GIVE THEM TO YOUR DOCTOR.  1. A  prescription for pain medication may be given to you upon discharge.  Take your pain medication as prescribed, if needed.  If narcotic pain medicine is not needed, then you may take acetaminophen (Tylenol) or ibuprofen (Advil) as needed. 2. Take your usually prescribed medications unless otherwise directed. If you need a refill on your pain medication, please contact your pharmacy.  They will  contact our office to request authorization. Prescriptions will not be filled after 5 pm or on week-ends. 3. You should follow a light diet the first 24 hours after arrival home, such as soup and crackers, etc.  Be sure to include lots of fluids daily.  Resume your normal diet the day after surgery. 4.Most patients will experience some swelling and bruising around the umbilicus or in the groin and scrotum.  Ice packs and reclining will help.  Swelling and bruising can take several days to resolve.  6. It is common to experience some constipation if taking pain medication after surgery.  Increasing fluid intake and taking a stool softener (such as Colace) will usually help or prevent this problem from occurring.  A mild laxative (Milk of Magnesia or Miralax) should be taken according to package directions if there are no bowel movements after 48 hours. 7. Unless discharge instructions indicate otherwise, you may remove your bandages 24-48 hours after surgery, and you may shower at that time.  You may have steri-strips (small skin tapes) in place directly over the incision.   These strips should be left on the skin for 7-10 days.  If your surgeon used skin glue on the incision, you may shower in 24 hours.  The glue will flake off over the next 2-3 weeks.  Any sutures or staples will be removed at the office during your follow-up visit. 8. ACTIVITIES:  You may resume regular (light) daily activities beginning the next day--such as daily self-care, walking, climbing stairs--gradually increasing activities as tolerated.  You may have sexual intercourse when it is comfortable.  Refrain from any heavy lifting or straining until approved by your doctor.  a.You may drive when you are no longer taking prescription pain medication, you can comfortably wear a seatbelt, and you can safely maneuver your car and apply brakes. b.RETURN TO WORK:   _____________________________________________  9.You should see your doctor in the office for a follow-up appointment approximately 2-3 weeks after your surgery.  Make sure that you call for this appointment within a day or two after you arrive home to insure a convenient appointment time. 10.OTHER INSTRUCTIONS: _________________________    _____________________________________  WHEN TO CALL YOUR DOCTOR: 1. Fever over 101.0 2. Inability to urinate 3. Nausea and/or vomiting 4. Extreme swelling or bruising 5. Continued bleeding from incision. 6. Increased pain, redness, or drainage from the incision  The clinic staff is available to answer your questions during regular business hours.  Please dont hesitate to call and ask to speak to one of the nurses for clinical concerns.  If you have a medical emergency, go to the nearest emergency room or call 911.  A surgeon from [facility] is always on call at the hospital   [address] [place], [place], [place]  [address] ?  [address], [place], [place]   [address] [phone] ? [phone] ? FAX [phone] Web site: [email or web address]     RESTART  ELOQUIS IN 48 HOURS

## 2019-12-08 NOTE — Transfer of Care (Signed)
Immediate Anesthesia Transfer of Care Note  Patient: Lawrence [name]  Procedure(s) Performed: OPEN INCISIONAL HERNIA REPAIR WITH MESH (N/A Abdomen) HERNIA REPAIR LEFT  INGUINAL ADULT WITH MESH (Left Groin)  Patient Location: PACU  Anesthesia Type:General  Level of Consciousness: drowsy and patient cooperative  Airway & Oxygen Therapy: Patient Spontanous Breathing  Post-op Assessment: Report given to RN and Post -op Vital signs reviewed and stable  Post vital signs: Reviewed and stable  Last Vitals:  Vitals Value Taken Time  BP 135/64 12/08/19 1315  Temp    Pulse 63 12/08/19 1316  Resp 16 12/08/19 1316  SpO2 95 % 12/08/19 1316  Vitals shown include unvalidated device data.  Last Pain:  Vitals:   12/08/19 1315  PainSc: Asleep         Complications: No apparent anesthesia complications

## 2019-12-09 ENCOUNTER — Encounter: Payer: Self-pay | Admitting: *Deleted

## 2019-12-10 ENCOUNTER — Ambulatory Visit: Payer: Medicare Other | Admitting: Cardiology

## 2020-01-06 DIAGNOSIS — M109 Gout, unspecified: Secondary | ICD-10-CM | POA: Diagnosis not present

## 2020-01-06 DIAGNOSIS — M8589 Other specified disorders of bone density and structure, multiple sites: Secondary | ICD-10-CM | POA: Diagnosis not present

## 2020-01-06 DIAGNOSIS — E7439 Other disorders of intestinal carbohydrate absorption: Secondary | ICD-10-CM | POA: Diagnosis not present

## 2020-01-22 DIAGNOSIS — N182 Chronic kidney disease, stage 2 (mild): Secondary | ICD-10-CM | POA: Diagnosis not present

## 2020-01-22 DIAGNOSIS — M8949 Other hypertrophic osteoarthropathy, multiple sites: Secondary | ICD-10-CM | POA: Diagnosis not present

## 2020-01-22 DIAGNOSIS — Z79899 Other long term (current) drug therapy: Secondary | ICD-10-CM | POA: Diagnosis not present

## 2020-01-22 DIAGNOSIS — M1A39X0 Chronic gout due to renal impairment, multiple sites, without tophus (tophi): Secondary | ICD-10-CM | POA: Diagnosis not present

## 2020-01-22 DIAGNOSIS — M153 Secondary multiple arthritis: Secondary | ICD-10-CM | POA: Diagnosis not present

## 2020-02-17 DIAGNOSIS — I509 Heart failure, unspecified: Secondary | ICD-10-CM | POA: Diagnosis not present

## 2020-02-17 DIAGNOSIS — Z950 Presence of cardiac pacemaker: Secondary | ICD-10-CM | POA: Diagnosis not present

## 2020-02-17 DIAGNOSIS — N179 Acute kidney failure, unspecified: Secondary | ICD-10-CM | POA: Diagnosis not present

## 2020-02-17 DIAGNOSIS — Z7901 Long term (current) use of anticoagulants: Secondary | ICD-10-CM | POA: Diagnosis not present

## 2020-02-17 DIAGNOSIS — I480 Paroxysmal atrial fibrillation: Secondary | ICD-10-CM | POA: Diagnosis not present

## 2020-02-17 DIAGNOSIS — E1122 Type 2 diabetes mellitus with diabetic chronic kidney disease: Secondary | ICD-10-CM | POA: Diagnosis not present

## 2020-02-17 DIAGNOSIS — I129 Hypertensive chronic kidney disease with stage 1 through stage 4 chronic kidney disease, or unspecified chronic kidney disease: Secondary | ICD-10-CM | POA: Diagnosis not present

## 2020-02-17 DIAGNOSIS — N189 Chronic kidney disease, unspecified: Secondary | ICD-10-CM | POA: Diagnosis not present

## 2020-02-17 DIAGNOSIS — D631 Anemia in chronic kidney disease: Secondary | ICD-10-CM | POA: Diagnosis not present

## 2020-02-17 DIAGNOSIS — N1831 Chronic kidney disease, stage 3a: Secondary | ICD-10-CM | POA: Diagnosis not present

## 2020-02-17 DIAGNOSIS — K922 Gastrointestinal hemorrhage, unspecified: Secondary | ICD-10-CM | POA: Diagnosis not present

## 2020-02-17 DIAGNOSIS — E559 Vitamin D deficiency, unspecified: Secondary | ICD-10-CM | POA: Diagnosis not present

## 2020-02-24 ENCOUNTER — Ambulatory Visit (INDEPENDENT_AMBULATORY_CARE_PROVIDER_SITE_OTHER): Payer: Medicare Other | Admitting: *Deleted

## 2020-02-24 DIAGNOSIS — M549 Dorsalgia, unspecified: Secondary | ICD-10-CM | POA: Diagnosis not present

## 2020-02-24 DIAGNOSIS — I480 Paroxysmal atrial fibrillation: Secondary | ICD-10-CM | POA: Diagnosis not present

## 2020-02-24 DIAGNOSIS — M25561 Pain in right knee: Secondary | ICD-10-CM | POA: Diagnosis not present

## 2020-02-24 DIAGNOSIS — D492 Neoplasm of unspecified behavior of bone, soft tissue, and skin: Secondary | ICD-10-CM | POA: Diagnosis not present

## 2020-02-24 LAB — CUP PACEART REMOTE DEVICE CHECK
Battery Remaining Longevity: 110 mo
Battery Remaining Percentage: 95.5 %
Battery Voltage: 3.01 V
Brady Statistic AP VP Percent: 4.4 %
Brady Statistic AP VS Percent: 84 %
Brady Statistic AS VP Percent: 1 %
Brady Statistic AS VS Percent: 12 %
Brady Statistic RA Percent Paced: 64 %
Brady Statistic RV Percent Paced: 8.6 %
Date Time Interrogation Session: 20210303020014
Implantable Lead Implant Date: 20180830
Implantable Lead Implant Date: 20180830
Implantable Lead Location: 753859
Implantable Lead Location: 753860
Implantable Pulse Generator Implant Date: 20180830
Lead Channel Impedance Value: 380 Ohm
Lead Channel Impedance Value: 430 Ohm
Lead Channel Pacing Threshold Amplitude: 0.625 V
Lead Channel Pacing Threshold Amplitude: 0.75 V
Lead Channel Pacing Threshold Pulse Width: 0.5 ms
Lead Channel Pacing Threshold Pulse Width: 0.5 ms
Lead Channel Sensing Intrinsic Amplitude: 2.4 mV
Lead Channel Sensing Intrinsic Amplitude: 9 mV
Lead Channel Setting Pacing Amplitude: 1.625
Lead Channel Setting Pacing Amplitude: 2.5 V
Lead Channel Setting Pacing Pulse Width: 0.5 ms
Lead Channel Setting Sensing Sensitivity: 2 mV
Pulse Gen Model: 2272
Pulse Gen Serial Number: 8939815

## 2020-02-24 NOTE — Progress Notes (Signed)
PPM Remote  

## 2020-02-26 ENCOUNTER — Telehealth: Payer: Self-pay | Admitting: Cardiology

## 2020-02-26 NOTE — Telephone Encounter (Signed)
Wife is requesting to come with patient to his appt on 02/29/20 with Dr. [name]. Wife states that patient does not walk well nor hear well.

## 2020-02-29 ENCOUNTER — Encounter: Payer: Self-pay | Admitting: Cardiology

## 2020-02-29 ENCOUNTER — Ambulatory Visit (INDEPENDENT_AMBULATORY_CARE_PROVIDER_SITE_OTHER): Payer: Medicare Other | Admitting: Cardiology

## 2020-02-29 ENCOUNTER — Other Ambulatory Visit: Payer: Self-pay

## 2020-02-29 VITALS — BP 122/62 | HR 64 | Ht 71.5 in | Wt 205.0 lb

## 2020-02-29 DIAGNOSIS — Z950 Presence of cardiac pacemaker: Secondary | ICD-10-CM

## 2020-02-29 DIAGNOSIS — Z951 Presence of aortocoronary bypass graft: Secondary | ICD-10-CM

## 2020-02-29 DIAGNOSIS — I272 Pulmonary hypertension, unspecified: Secondary | ICD-10-CM | POA: Diagnosis not present

## 2020-02-29 DIAGNOSIS — I480 Paroxysmal atrial fibrillation: Secondary | ICD-10-CM | POA: Diagnosis not present

## 2020-02-29 DIAGNOSIS — I10 Essential (primary) hypertension: Secondary | ICD-10-CM

## 2020-02-29 DIAGNOSIS — I2510 Atherosclerotic heart disease of native coronary artery without angina pectoris: Secondary | ICD-10-CM | POA: Diagnosis not present

## 2020-02-29 NOTE — Progress Notes (Signed)
Cardiology Office Note:    Date:  02/29/2020   ID:  Lawrence [name], DOB [birth year], MRN [id number]  PCP:  [name], MD  Cardiologist:  [name], MD    Referring MD: [name], MD   No chief complaint on file. Doing well but the back is a problem  History of Present Illness:    Lawrence [name] is a 74 y.o. male with past medical history significant for coronary artery disease status post coronary artery bypass graft years ago, pulmonary hypertension, essential hypertension, paroxysmal atrial fibrillation, chronic kidney disease.  He comes today to my office follow-up overall he is doing well he have difficulty walking or moving around because of chronic back problem.  Few months ago he ended up having abdominal surgery for incisional hernia.  Things went well and he is doing well.  Denies having any chest pain, tightness, pressure, burning in the chest no dizziness no passing out just weakness and fatigue.  Past Medical History:  Diagnosis Date  . Anemia   . Arthritis    knees  . Cataracts, bilateral    removed   . Chronic airway obstruction, not elsewhere classified    patient denies this dx on 12/04/19 - (Dr Melvyn Novas dx 02/2013)  . Chronic ischemic heart disease, unspecified   . Coronary artery disease   . Diabetes mellitus    type II, neuropathy,   . Essential hypertension, benign   . Hearing loss    wears hearing aids  . Hyperlipidemia, mixed   . Nodule of kidney    incidentally found on CT abdomen 11/2012.  Pending Urology evaluation.   . Nontoxic multinodular goiter   . Obesity   . Other testicular hypofunction   . Pneumonia    x 1  . Presence of permanent cardiac pacemaker    ST Jude PPM  . PUD (peptic ulcer disease)   . Renal insufficiency, mild    hx of  . Sleep apnea    Does not use CPAP in last 3 months per patient 12/04/19    Past Surgical History:  Procedure Laterality Date  . ATRIAL FIBRILLATION ABLATION N/A 02/05/2019    Procedure: ATRIAL FIBRILLATION ABLATION;  Surgeon: Constance Haw, MD;  Location: [place] [facility];  Service: Cardiovascular;  Laterality: N/A;  . CHOLECYSTECTOMY    . COLONOSCOPY  05/11/2015   Colonic polyps statuts post polypectomy. Moderate predominalty sigmoid diverticulosis. Internal hemorrhoids. Tubular adenoma.   . CORONARY ARTERY BYPASS GRAFT  2007   x 4  . ESOPHAGOGASTRODUODENOSCOPY     10/06/2012:  hiatal hernia, moderate gastritis.  2012: Colonoscopy at [facility]:  one polyp.  Due next 2017  . EYE SURGERY     left eye metal removed   . EYE SURGERY     bilateral cataracts removed  . INCISIONAL HERNIA REPAIR N/A 12/08/2019   Procedure: OPEN INCISIONAL HERNIA REPAIR WITH MESH;  Surgeon: Erroll Luna, MD;  Location: [place];  Service: General;  Laterality: N/A;  . INGUINAL HERNIA REPAIR Left 12/08/2019   Procedure: HERNIA REPAIR LEFT  INGUINAL ADULT WITH MESH;  Surgeon: Erroll Luna, MD;  Location: [place];  Service: General;  Laterality: Left;  . KNEE DEBRIDEMENT Right   . LAPAROTOMY N/A 07/21/2018   Procedure: EXPLORATORY LAPAROTOMY WITH SMALL BOWEL RESECTION;  Surgeon: Erroll Luna, MD;  Location: [place];  Service: General;  Laterality: N/A;  . PACEMAKER IMPLANT N/A 08/22/2017   Procedure: Pacemaker Implant; [place] PPM  Surgeon:  Constance Haw, MD;  Location: [place] [facility];  Service: Cardiovascular;  Laterality: N/A;  . SHOULDER SURGERY     left shoulder  . TONSILECTOMY, ADENOIDECTOMY, BILATERAL MYRINGOTOMY AND TUBES    . WISDOM TOOTH EXTRACTION      Current Medications: Current Meds  Medication Sig  . allopurinol (ZYLOPRIM) 100 MG tablet Take 100 mg by mouth daily.  Marland Kitchen apixaban (ELIQUIS) 5 MG TABS tablet Take 5 mg by mouth 2 (two) times daily.  . Capsaicin 0.1 % CREA Apply 1 application topically at bedtime.   . Cholecalciferol (VITAMIN D3) 2000 units TABS Take 1,000 Units by mouth daily.   . cholestyramine (QUESTRAN) 4 g packet Take 4 g by mouth daily as  needed (diarrhea).   . colchicine 0.6 MG tablet Take 0.6 mg by mouth daily.  . furosemide (LASIX) 40 MG tablet Take 40 mg by mouth daily.   Marland Kitchen glimepiride (AMARYL) 2 MG tablet Take 2 mg by mouth daily as needed (blood sugar 120 or higher).   . Ipratropium-Albuterol (COMBIVENT RESPIMAT) 20-100 MCG/ACT AERS respimat Inhale 1 puff into the lungs daily as needed for wheezing or shortness of breath.   . lidocaine (LIDODERM) 5 % Place 1-3 patches onto the skin daily. Remove & Discard patch within 12 hours or as directed by MD   . metolazone (ZAROXOLYN) 2.5 MG tablet Take 1 tablet (2.5 mg total) by mouth every Monday, Wednesday, and Friday. (Patient taking differently: Take 2.5 mg by mouth daily as needed (weight gain of 5lbs or more). )  . metoprolol tartrate (LOPRESSOR) 100 MG tablet Take 1 tablet (100 mg total) by mouth 2 (two) times daily.  . nitroGLYCERIN (NITROSTAT) 0.4 MG SL tablet Place 0.4 mg under the tongue every 5 (five) minutes as needed for chest pain.   . Oxcarbazepine (TRILEPTAL) 300 MG tablet Take 150-300 mg by mouth 2 (two) times daily. Take 150 mg by mouth in the morning and afternoon and 300 mg at bedtime  . oxyCODONE (OXY IR/ROXICODONE) 5 MG immediate release tablet Take 1 tablet (5 mg total) by mouth every 6 (six) hours as needed for severe pain.  . pantoprazole (PROTONIX) 40 MG tablet Take 40 mg by mouth daily before breakfast.   . simvastatin (ZOCOR) 20 MG tablet Take 20 mg by mouth at bedtime.      Allergies:   Ciprofloxacin, Sulfamethoxazole-trimethoprim, Lisinopril, and Naproxen   Social History   Socioeconomic History  . Marital status: Married    Spouse name: Not on file  . Number of children: 5  . Years of education: Not on file  . Highest education level: Not on file  Occupational History  . Occupation: Retired    Comment: Medical illustrator; retired Chiropodist  Tobacco Use  . Smoking status: Former Smoker    Packs/day: 1.50    Years: 30.00    Pack years:  45.00    Types: Cigarettes    Quit date: 2006    Years since quitting: 15.1  . Smokeless tobacco: Former Systems developer    Types: Chew  Substance and Sexual Activity  . Alcohol use: Yes    Comment: rare  . Drug use: No  . Sexual activity: Not on file  Other Topics Concern  . Not on file  Social History Narrative   ** Merged History Encounter **       Lives with wife in a one story home.  Has 6 children.  Retired Nature conservation officer.  Education: college.   Social Determinants of  Health   Financial Resource Strain:   . Difficulty of Paying Living Expenses: Not on file  Food Insecurity:   . Worried About Charity fundraiser in the Last Year: Not on file  . Ran Out of Food in the Last Year: Not on file  Transportation Needs:   . Lack of Transportation (Medical): Not on file  . Lack of Transportation (Non-Medical): Not on file  Physical Activity:   . Days of Exercise per Week: Not on file  . Minutes of Exercise per Session: Not on file  Stress:   . Feeling of Stress : Not on file  Social Connections:   . Frequency of Communication with Friends and Family: Not on file  . Frequency of Social Gatherings with Friends and Family: Not on file  . Attends Religious Services: Not on file  . Active Member of Clubs or Organizations: Not on file  . Attends Archivist Meetings: Not on file  . Marital Status: Not on file     Family History: The patient's family history includes Asthma in his sister; Cancer in his father and mother; Colon cancer in his mother; Emphysema in an other family member; Esophageal cancer in his father; Hypertension in his mother; Kidney cancer in his mother; Stroke in his father. ROS:   Please see the history of present illness.    All 14 point review of systems negative except as described per history of present illness  EKGs/Labs/Other Studies Reviewed:      Recent Labs: 05/11/2019: NT-Pro BNP 1,913 12/04/2019: ALT 26; BUN 24; Creatinine, Ser 1.34; Hemoglobin 11.5;  Platelets 208; Potassium 4.1; Sodium 139  Recent Lipid Panel No results found for: CHOL, TRIG, HDL, CHOLHDL, VLDL, LDLCALC, LDLDIRECT  Physical Exam:    VS:  BP 122/62   Pulse 64   Ht 5' 11.5" (1.816 m)   Wt 205 lb (93 kg)   BMI 28.19 kg/m     Wt Readings from Last 3 Encounters:  02/29/20 205 lb (93 kg)  12/08/19 199 lb 8 oz (90.5 kg)  12/04/19 210 lb 3 oz (95.3 kg)     GEN:  Well nourished, well developed in no acute distress HEENT: Normal NECK: No JVD; No carotid bruits LYMPHATICS: No lymphadenopathy CARDIAC: RRR, no murmurs, no rubs, no gallops RESPIRATORY:  Clear to auscultation without rales, wheezing or rhonchi  ABDOMEN: Soft, non-tender, non-distended MUSCULOSKELETAL:  No edema; No deformity  SKIN: Warm and dry LOWER EXTREMITIES: no swelling NEUROLOGIC:  Alert and oriented x 3 PSYCHIATRIC:  Normal affect   ASSESSMENT:    1. Coronary artery disease involving native coronary artery of native heart without angina pectoris   2. Essential hypertension, benign   3. Paroxysmal atrial fibrillation (HCC)   4. Pulmonary hypertension ([place])   5. Status post coronary artery bypass graft   6. Pacemaker    PLAN:    In order of problems listed above:  1. Coronary artery disease: Stable from that point review of appropriate medication which I will continue. 2. Essential hypertension blood pressure well controlled today 122/62 we will continue present management. 3. Paroxysmal atrial fibrillation interrogation of his device review she does have multiple episodes of fast atrial rate likely due to short episodes.  He is anticoagulated which I will continue. 4. Pacemaker interrogation reviewed normal function battery status is good.  Lead parameters acceptable. 5. Pulmonary hypertension.  Mildly elevated, stable.   Medication Adjustments/Labs and Tests Ordered: Current medicines are reviewed at length with the patient  today.  Concerns regarding medicines are outlined above.   No orders of the defined types were placed in this encounter.  Medication changes: No orders of the defined types were placed in this encounter.   Signed, Park Liter, MD, [facility] 02/29/2020 10:20 AM    [place]

## 2020-02-29 NOTE — Patient Instructions (Signed)

## 2020-03-09 DIAGNOSIS — D485 Neoplasm of uncertain behavior of skin: Secondary | ICD-10-CM | POA: Diagnosis not present

## 2020-03-09 DIAGNOSIS — C4401 Basal cell carcinoma of skin of lip: Secondary | ICD-10-CM | POA: Diagnosis not present

## 2020-03-30 DIAGNOSIS — Z23 Encounter for immunization: Secondary | ICD-10-CM | POA: Diagnosis not present

## 2020-04-07 DIAGNOSIS — R7401 Elevation of levels of liver transaminase levels: Secondary | ICD-10-CM | POA: Diagnosis not present

## 2020-04-07 DIAGNOSIS — C44329 Squamous cell carcinoma of skin of other parts of face: Secondary | ICD-10-CM | POA: Diagnosis not present

## 2020-04-07 DIAGNOSIS — I509 Heart failure, unspecified: Secondary | ICD-10-CM | POA: Diagnosis not present

## 2020-04-07 DIAGNOSIS — N189 Chronic kidney disease, unspecified: Secondary | ICD-10-CM | POA: Diagnosis not present

## 2020-04-07 DIAGNOSIS — D500 Iron deficiency anemia secondary to blood loss (chronic): Secondary | ICD-10-CM

## 2020-04-12 DIAGNOSIS — Z6832 Body mass index (BMI) 32.0-32.9, adult: Secondary | ICD-10-CM | POA: Diagnosis not present

## 2020-04-12 DIAGNOSIS — I10 Essential (primary) hypertension: Secondary | ICD-10-CM | POA: Diagnosis not present

## 2020-04-12 DIAGNOSIS — H6692 Otitis media, unspecified, left ear: Secondary | ICD-10-CM | POA: Diagnosis not present

## 2020-04-21 ENCOUNTER — Encounter: Payer: Self-pay | Admitting: Cardiology

## 2020-05-02 DIAGNOSIS — C4401 Basal cell carcinoma of skin of lip: Secondary | ICD-10-CM | POA: Diagnosis not present

## 2020-05-16 ENCOUNTER — Other Ambulatory Visit: Payer: Self-pay | Admitting: Cardiology

## 2020-05-20 ENCOUNTER — Other Ambulatory Visit: Payer: Self-pay

## 2020-05-25 ENCOUNTER — Ambulatory Visit (INDEPENDENT_AMBULATORY_CARE_PROVIDER_SITE_OTHER): Payer: Medicare Other | Admitting: *Deleted

## 2020-05-25 DIAGNOSIS — I495 Sick sinus syndrome: Secondary | ICD-10-CM

## 2020-05-25 LAB — CUP PACEART REMOTE DEVICE CHECK
Battery Remaining Longevity: 109 mo
Battery Remaining Percentage: 95.5 %
Battery Voltage: 3.01 V
Brady Statistic AP VP Percent: 6.3 %
Brady Statistic AP VS Percent: 83 %
Brady Statistic AS VP Percent: 1 %
Brady Statistic AS VS Percent: 9.7 %
Brady Statistic RA Percent Paced: 68 %
Brady Statistic RV Percent Paced: 9.5 %
Date Time Interrogation Session: 20210602020013
Implantable Lead Implant Date: 20180830
Implantable Lead Implant Date: 20180830
Implantable Lead Location: 753859
Implantable Lead Location: 753860
Implantable Pulse Generator Implant Date: 20180830
Lead Channel Impedance Value: 380 Ohm
Lead Channel Impedance Value: 410 Ohm
Lead Channel Pacing Threshold Amplitude: 0.75 V
Lead Channel Pacing Threshold Amplitude: 0.75 V
Lead Channel Pacing Threshold Pulse Width: 0.5 ms
Lead Channel Pacing Threshold Pulse Width: 0.5 ms
Lead Channel Sensing Intrinsic Amplitude: 1.2 mV
Lead Channel Sensing Intrinsic Amplitude: 10 mV
Lead Channel Setting Pacing Amplitude: 1.75 V
Lead Channel Setting Pacing Amplitude: 2.5 V
Lead Channel Setting Pacing Pulse Width: 0.5 ms
Lead Channel Setting Sensing Sensitivity: 2 mV
Pulse Gen Model: 2272
Pulse Gen Serial Number: 8939815

## 2020-05-26 ENCOUNTER — Encounter: Payer: Self-pay | Admitting: Cardiology

## 2020-05-26 ENCOUNTER — Ambulatory Visit (INDEPENDENT_AMBULATORY_CARE_PROVIDER_SITE_OTHER): Payer: Medicare Other | Admitting: Cardiology

## 2020-05-26 ENCOUNTER — Other Ambulatory Visit: Payer: Self-pay

## 2020-05-26 VITALS — BP 124/70 | HR 61 | Ht 71.5 in | Wt 209.0 lb

## 2020-05-26 DIAGNOSIS — E785 Hyperlipidemia, unspecified: Secondary | ICD-10-CM

## 2020-05-26 DIAGNOSIS — I5032 Chronic diastolic (congestive) heart failure: Secondary | ICD-10-CM | POA: Diagnosis not present

## 2020-05-26 DIAGNOSIS — Z951 Presence of aortocoronary bypass graft: Secondary | ICD-10-CM | POA: Diagnosis not present

## 2020-05-26 DIAGNOSIS — I272 Pulmonary hypertension, unspecified: Secondary | ICD-10-CM | POA: Diagnosis not present

## 2020-05-26 DIAGNOSIS — N182 Chronic kidney disease, stage 2 (mild): Secondary | ICD-10-CM

## 2020-05-26 DIAGNOSIS — I2510 Atherosclerotic heart disease of native coronary artery without angina pectoris: Secondary | ICD-10-CM | POA: Diagnosis not present

## 2020-05-26 DIAGNOSIS — I480 Paroxysmal atrial fibrillation: Secondary | ICD-10-CM

## 2020-05-26 NOTE — Progress Notes (Signed)
Cardiology Office Note:    Date:  05/26/2020   ID:  Lawrence [name], DOB Jun 17, 1946, MRN [id number]  PCP:  [name], MD  Cardiologist:  [name], MD    Referring MD: [name], MD   Chief Complaint  Patient presents with  . Follow-up  Doing fine  History of Present Illness:    Lawrence [name] is a 74 y.o. male past medical history significant for coronary artery disease, history of cardiomyopathy however last estimation of left ventricle ejection fraction showed normal, diabetes, essential hypertension, dyslipidemia, coronary bypass graft.  Comes today to my office for follow-up.  Just recently he got extensive plastic surgery done on his lip because of basal cell carcinoma.  He is recovering from it with no difficulties.  Complained of being weak tired and exhausted.  Denies having any dizziness except for sometimes when he gets up.  No chest pain, tightness, pressure, burning in the chest.  Past Medical History:  Diagnosis Date  . Acute on chronic diastolic CHF (congestive heart failure) ([place]) 01/03/2018  . Age-related cataract of both eyes 05/08/2018  . Anemia   . Anemia, iron deficiency 12/28/2014  . Arteriosclerosis of coronary artery 05/26/2019   Formatting of this note might be different from the original. Apr 12, 2017 Entered By: [place] Comment: April, 2018 echocardiogram = normal  . Arthritis    knees  . Bradycardia with 31-40 beats per minute 08/21/2017  . CAP (community acquired pneumonia) 01/02/2018  . Cataracts, bilateral    removed   . Cervical spondylosis 05/08/2018  . CHF (congestive heart failure), NYHA class II, chronic, diastolic ([place]) 04/24/7740  . Chronic airway obstruction, not elsewhere classified    patient denies this dx on 12/04/19 - (Dr Melvyn Novas dx 02/2013)  . Chronic ischemic heart disease, unspecified   . Chronic obstructive pulmonary disease ([place]) 01/22/2013   Formatting of this note might be different from the original.  Overview:  Followed in Pulmonary clinic/ [facility]/ [place]  - PFTs 03/10/2013 FEV1  2.61 (86%) ratio 76 and no better p B2 with DLCO 66 corrects to 86%  Last Assessment & Plan:  - PFTs 03/10/2013 FEV1  2.61 (86%) ratio 76 and no better p B2 with DLCO 66 corrects to 86%  He does not have significant copd and never will.  I reviewed   . CKD (chronic kidney disease) stage 3, GFR 30-59 ml/min 07/26/2018  . Coronary artery disease   . Coronary artery disease involving native coronary artery of native heart without angina pectoris 01/09/2016  . Diabetes mellitus    type II, neuropathy,   . 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 Dyslipidemia 01/09/2016  . ED (erectile dysfunction) of organic origin 07/05/2014  . Erectile dysfunction due to arterial insufficiency 07/20/2015  . Esophageal reflux 05/08/2018  . Essential hypertension 05/08/2018  . Essential hypertension, benign   . GI bleed 07/21/2018  . Gout 05/08/2018  . Hearing loss    wears hearing aids  . Heart failure ([place]) 05/08/2018  . Hyperlipidemia, mixed   . Mixed hyperlipidemia 05/08/2018  . Nodule of kidney    incidentally found on CT abdomen 11/2012.  Pending Urology evaluation.   . Nontoxic multinodular goiter   . Obesity   . Obstructive sleep apnea (adult) (pediatric) 01/09/2016  . Other testicular hypofunction   . Pacemaker 09/13/2017   St Jude Device   . Peripheral vascular disease ([place]) 01/09/2016   Formatting of this note might be different from  the original. Right SFA by segmental pressure, conservative management  . Permanent atrial fibrillation ([place])   . Peyronie's disease 07/20/2015  . Pneumonia    x 1  . Presence of permanent cardiac pacemaker    ST Jude PPM  . 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 PTSD (post-traumatic stress disorder) 05/08/2018  . PUD (peptic ulcer disease)   . Pulmonary hypertension ([place]) 01/09/2016  . Renal cyst, right 07/05/2014  . Renal insufficiency, mild    hx of  . Renal  lesion 05/25/2014  . Sensorineural hearing loss (SNHL) of both ears 05/08/2018  . Sleep apnea    Does not use CPAP in last 3 months per patient 12/04/19  . Small bowel ischemia w pneumatosis s/p ileocectomy 07/21/2018   . Stage 2 chronic kidney disease 05/08/2018  . Status post coronary artery bypass graft 01/09/2016  . Testicular hypofunction 05/08/2018  . Tinnitus of both ears 05/08/2018  . Type 2 diabetes mellitus with diabetic neuropathy ([place]) 05/08/2018    Past Surgical History:  Procedure Laterality Date  . ATRIAL FIBRILLATION ABLATION N/A 02/05/2019   Procedure: ATRIAL FIBRILLATION ABLATION;  Surgeon: Constance Haw, MD;  Location: [place] [facility];  Service: Cardiovascular;  Laterality: N/A;  . CHOLECYSTECTOMY    . COLONOSCOPY  05/11/2015   Colonic polyps statuts post polypectomy. Moderate predominalty sigmoid diverticulosis. Internal hemorrhoids. Tubular adenoma.   . CORONARY ARTERY BYPASS GRAFT  2007   x 4  . ESOPHAGOGASTRODUODENOSCOPY     10/06/2012:  hiatal hernia, moderate gastritis.  2012: Colonoscopy at [facility]:  one polyp.  Due next 2017  . EYE SURGERY     left eye metal removed   . EYE SURGERY     bilateral cataracts removed  . INCISIONAL HERNIA REPAIR N/A 12/08/2019   Procedure: OPEN INCISIONAL HERNIA REPAIR WITH MESH;  Surgeon: Erroll Luna, MD;  Location: [place];  Service: General;  Laterality: N/A;  . INGUINAL HERNIA REPAIR Left 12/08/2019   Procedure: HERNIA REPAIR LEFT  INGUINAL ADULT WITH MESH;  Surgeon: Erroll Luna, MD;  Location: [place];  Service: General;  Laterality: Left;  . KNEE DEBRIDEMENT Right   . LAPAROTOMY N/A 07/21/2018   Procedure: EXPLORATORY LAPAROTOMY WITH SMALL BOWEL RESECTION;  Surgeon: Erroll Luna, MD;  Location: [place];  Service: General;  Laterality: N/A;  . PACEMAKER IMPLANT N/A 08/22/2017   Procedure: Pacemaker Implant; St. Jude PPM  Surgeon: Constance Haw, MD;  Location: [place] [facility];  Service: Cardiovascular;  Laterality:  N/A;  . SHOULDER SURGERY     left shoulder  . TONSILECTOMY, ADENOIDECTOMY, BILATERAL MYRINGOTOMY AND TUBES    . WISDOM TOOTH EXTRACTION      Current Medications: Current Meds  Medication Sig  . allopurinol (ZYLOPRIM) 100 MG tablet Take 100 mg by mouth daily.  Marland Kitchen apixaban (ELIQUIS) 5 MG TABS tablet Take 5 mg by mouth 2 (two) times daily.  . Capsaicin 0.1 % CREA Apply 1 application topically at bedtime.   . Cholecalciferol (VITAMIN D3) 2000 units TABS Take 1,000 Units by mouth daily.   . cholestyramine (QUESTRAN) 4 g packet Take 4 g by mouth daily as needed (diarrhea).   . colchicine 0.6 MG tablet Take 0.6 mg by mouth daily.  . furosemide (LASIX) 40 MG tablet Take 40 mg by mouth daily.   Marland Kitchen glimepiride (AMARYL) 2 MG tablet Take 2 mg by mouth daily as needed (blood sugar 120 or higher).   . Ipratropium-Albuterol (COMBIVENT RESPIMAT) 20-100 MCG/ACT AERS respimat Inhale  1 puff into the lungs daily as needed for wheezing or shortness of breath.   . lidocaine (LIDODERM) 5 % Place 1-3 patches onto the skin daily. Remove & Discard patch within 12 hours or as directed by MD   . metolazone (ZAROXOLYN) 2.5 MG tablet Take 1 tablet (2.5 mg total) by mouth every Monday, Wednesday, and Friday. (Patient taking differently: Take 2.5 mg by mouth daily as needed (weight gain of 5lbs or more). )  . metoprolol tartrate (LOPRESSOR) 100 MG tablet TAKE 1 TABLET TWICE A DAY ( DOSE INCREASE PER DR CAMNITZ )  . nitroGLYCERIN (NITROSTAT) 0.4 MG SL tablet Place 0.4 mg under the tongue every 5 (five) minutes as needed for chest pain.   . Oxcarbazepine (TRILEPTAL) 300 MG tablet Take 150-300 mg by mouth 2 (two) times daily. Take 150 mg by mouth in the morning and afternoon and 300 mg at bedtime  . oxyCODONE (OXY IR/ROXICODONE) 5 MG immediate release tablet Take 1 tablet (5 mg total) by mouth every 6 (six) hours as needed for severe pain.  . pantoprazole (PROTONIX) 40 MG tablet Take 40 mg by mouth daily before breakfast.   .  simvastatin (ZOCOR) 20 MG tablet Take 20 mg by mouth at bedtime.      Allergies:   Ciprofloxacin, Sulfamethoxazole-trimethoprim, Lisinopril, and Naproxen   Social History   Socioeconomic History  . Marital status: Married    Spouse name: Not on file  . Number of children: 5  . Years of education: Not on file  . Highest education level: Not on file  Occupational History  . Occupation: Retired    Comment: Medical illustrator; retired Chiropodist  Tobacco Use  . Smoking status: Former Smoker    Packs/day: 1.50    Years: 30.00    Pack years: 45.00    Types: Cigarettes    Quit date: 2006    Years since quitting: 15.4  . Smokeless tobacco: Former Systems developer    Types: Chew  Substance and Sexual Activity  . Alcohol use: Yes    Comment: rare  . Drug use: No  . Sexual activity: Not on file  Other Topics Concern  . Not on file  Social History Narrative   ** Merged History Encounter **       Lives with wife in a one story home.  Has 6 children.  Retired Nature conservation officer.  Education: college.   Social Determinants of Health   Financial Resource Strain:   . Difficulty of Paying Living Expenses:   Food Insecurity:   . Worried About Charity fundraiser in the Last Year:   . Arboriculturist in the Last Year:   Transportation Needs:   . Film/video editor (Medical):   Marland Kitchen Lack of Transportation (Non-Medical):   Physical Activity:   . Days of Exercise per Week:   . Minutes of Exercise per Session:   Stress:   . Feeling of Stress :   Social Connections:   . Frequency of Communication with Friends and Family:   . Frequency of Social Gatherings with Friends and Family:   . Attends Religious Services:   . Active Member of Clubs or Organizations:   . Attends Archivist Meetings:   Marland Kitchen Marital Status:      Family History: The patient's family history includes Asthma in his sister; Cancer in his father and mother; Colon cancer in his mother; Emphysema in an other family member;  Esophageal cancer in his father; Hypertension in his  mother; Kidney cancer in his mother; Stroke in his father. ROS:   Please see the history of present illness.    All 14 point review of systems negative except as described per history of present illness  EKGs/Labs/Other Studies Reviewed:      Recent Labs: 12/04/2019: ALT 26; BUN 24; Creatinine, Ser 1.34; Hemoglobin 11.5; Platelets 208; Potassium 4.1; Sodium 139  Recent Lipid Panel No results found for: CHOL, TRIG, HDL, CHOLHDL, VLDL, LDLCALC, LDLDIRECT  Physical Exam:    VS:  BP 124/70   Pulse 61   Ht 5' 11.5" (1.816 m)   Wt 209 lb (94.8 kg)   SpO2 96%   BMI 28.74 kg/m     Wt Readings from Last 3 Encounters:  05/26/20 209 lb (94.8 kg)  02/29/20 205 lb (93 kg)  12/08/19 199 lb 8 oz (90.5 kg)     GEN:  Well nourished, well developed in no acute distress HEENT: Normal NECK: No JVD; No carotid bruits LYMPHATICS: No lymphadenopathy CARDIAC: RRR, no murmurs, no rubs, no gallops RESPIRATORY:  Clear to auscultation without rales, wheezing or rhonchi  ABDOMEN: Soft, non-tender, non-distended MUSCULOSKELETAL:  No edema; No deformity  SKIN: Warm and dry LOWER EXTREMITIES: no swelling NEUROLOGIC:  Alert and oriented x 3 PSYCHIATRIC:  Normal affect   ASSESSMENT:    1. Paroxysmal atrial fibrillation (HCC)   2. Pulmonary hypertension ([place])   3. Coronary artery disease involving native coronary artery of native heart without angina pectoris   4. CHF (congestive heart failure), NYHA class II, chronic, diastolic (HCC)   5. Stage 2 chronic kidney disease   6. Status post coronary artery bypass graft   7. Dyslipidemia    PLAN:    In order of problems listed above:  1. Paroxysmal atrial fibrillation.  He is anticoagulant with Eliquis which I will continue.  He does have fast ventricular rate many times when he had episode of atrial fibrillation but he is already on high dose of beta-blocker which I will continue. 2. Pulmonary  hypertension overall symptoms of the same, no worsening of the condition.  We will repeat his echocardiogram will within the next few months.  I did review his echocardiogram from December of last year. 3. Coronary disease stress test reviewed from December showing no evidence of ischemia. 4. Congestive heart failure [place] Heart Association class II.  Doing well on appropriate medication. 5. Kidney dysfunction: Will call primary care physician to get his fasting lipid profile as well as Chem-7.   Medication Adjustments/Labs and Tests Ordered: Current medicines are reviewed at length with the patient today.  Concerns regarding medicines are outlined above.  No orders of the defined types were placed in this encounter.  Medication changes: No orders of the defined types were placed in this encounter.   Signed, Park Liter, MD, [facility] 05/26/2020 9:47 AM    [place]

## 2020-05-26 NOTE — Progress Notes (Signed)
Remote pacemaker transmission.   

## 2020-05-26 NOTE — Patient Instructions (Signed)

## 2020-05-27 ENCOUNTER — Ambulatory Visit: Payer: Medicare Other | Admitting: Cardiology

## 2020-07-05 DIAGNOSIS — Z6833 Body mass index (BMI) 33.0-33.9, adult: Secondary | ICD-10-CM | POA: Diagnosis not present

## 2020-07-05 DIAGNOSIS — R5381 Other malaise: Secondary | ICD-10-CM | POA: Diagnosis not present

## 2020-07-05 DIAGNOSIS — J189 Pneumonia, unspecified organism: Secondary | ICD-10-CM | POA: Diagnosis not present

## 2020-07-05 DIAGNOSIS — Z79899 Other long term (current) drug therapy: Secondary | ICD-10-CM | POA: Diagnosis not present

## 2020-07-05 DIAGNOSIS — E7800 Pure hypercholesterolemia, unspecified: Secondary | ICD-10-CM | POA: Diagnosis not present

## 2020-07-05 DIAGNOSIS — D519 Vitamin B12 deficiency anemia, unspecified: Secondary | ICD-10-CM | POA: Diagnosis not present

## 2020-07-05 DIAGNOSIS — R5383 Other fatigue: Secondary | ICD-10-CM | POA: Diagnosis not present

## 2020-08-04 DIAGNOSIS — D638 Anemia in other chronic diseases classified elsewhere: Secondary | ICD-10-CM | POA: Diagnosis not present

## 2020-08-04 DIAGNOSIS — I10 Essential (primary) hypertension: Secondary | ICD-10-CM | POA: Diagnosis not present

## 2020-08-04 DIAGNOSIS — Z6833 Body mass index (BMI) 33.0-33.9, adult: Secondary | ICD-10-CM | POA: Diagnosis not present

## 2020-08-04 DIAGNOSIS — E1165 Type 2 diabetes mellitus with hyperglycemia: Secondary | ICD-10-CM | POA: Diagnosis not present

## 2020-08-04 DIAGNOSIS — R748 Abnormal levels of other serum enzymes: Secondary | ICD-10-CM | POA: Diagnosis not present

## 2020-08-04 DIAGNOSIS — M109 Gout, unspecified: Secondary | ICD-10-CM | POA: Diagnosis not present

## 2020-08-08 DIAGNOSIS — R748 Abnormal levels of other serum enzymes: Secondary | ICD-10-CM | POA: Diagnosis not present

## 2020-08-13 DIAGNOSIS — K760 Fatty (change of) liver, not elsewhere classified: Secondary | ICD-10-CM | POA: Diagnosis not present

## 2020-08-13 DIAGNOSIS — Z79899 Other long term (current) drug therapy: Secondary | ICD-10-CM | POA: Diagnosis not present

## 2020-08-13 DIAGNOSIS — Z9049 Acquired absence of other specified parts of digestive tract: Secondary | ICD-10-CM | POA: Diagnosis not present

## 2020-08-13 DIAGNOSIS — R748 Abnormal levels of other serum enzymes: Secondary | ICD-10-CM | POA: Diagnosis not present

## 2020-08-18 DIAGNOSIS — E559 Vitamin D deficiency, unspecified: Secondary | ICD-10-CM | POA: Diagnosis not present

## 2020-08-18 DIAGNOSIS — E1122 Type 2 diabetes mellitus with diabetic chronic kidney disease: Secondary | ICD-10-CM | POA: Diagnosis not present

## 2020-08-18 DIAGNOSIS — N179 Acute kidney failure, unspecified: Secondary | ICD-10-CM | POA: Diagnosis not present

## 2020-08-18 DIAGNOSIS — I509 Heart failure, unspecified: Secondary | ICD-10-CM | POA: Diagnosis not present

## 2020-08-18 DIAGNOSIS — I129 Hypertensive chronic kidney disease with stage 1 through stage 4 chronic kidney disease, or unspecified chronic kidney disease: Secondary | ICD-10-CM | POA: Diagnosis not present

## 2020-08-18 DIAGNOSIS — I480 Paroxysmal atrial fibrillation: Secondary | ICD-10-CM | POA: Diagnosis not present

## 2020-08-18 DIAGNOSIS — K922 Gastrointestinal hemorrhage, unspecified: Secondary | ICD-10-CM | POA: Diagnosis not present

## 2020-08-18 DIAGNOSIS — Z950 Presence of cardiac pacemaker: Secondary | ICD-10-CM | POA: Diagnosis not present

## 2020-08-18 DIAGNOSIS — N1831 Chronic kidney disease, stage 3a: Secondary | ICD-10-CM | POA: Diagnosis not present

## 2020-08-18 DIAGNOSIS — D631 Anemia in chronic kidney disease: Secondary | ICD-10-CM | POA: Diagnosis not present

## 2020-08-18 DIAGNOSIS — Z7901 Long term (current) use of anticoagulants: Secondary | ICD-10-CM | POA: Diagnosis not present

## 2020-08-26 ENCOUNTER — Ambulatory Visit (INDEPENDENT_AMBULATORY_CARE_PROVIDER_SITE_OTHER): Payer: Medicare Other | Admitting: *Deleted

## 2020-08-26 DIAGNOSIS — I495 Sick sinus syndrome: Secondary | ICD-10-CM

## 2020-08-27 LAB — CUP PACEART REMOTE DEVICE CHECK
Battery Remaining Longevity: 108 mo
Battery Remaining Percentage: 95.5 %
Battery Voltage: 3.01 V
Brady Statistic AP VP Percent: 11 %
Brady Statistic AP VS Percent: 80 %
Brady Statistic AS VP Percent: 1 %
Brady Statistic AS VS Percent: 8.5 %
Brady Statistic RA Percent Paced: 72 %
Brady Statistic RV Percent Paced: 13 %
Date Time Interrogation Session: 20210901020013
Implantable Lead Implant Date: 20180830
Implantable Lead Implant Date: 20180830
Implantable Lead Location: 753859
Implantable Lead Location: 753860
Implantable Pulse Generator Implant Date: 20180830
Lead Channel Impedance Value: 380 Ohm
Lead Channel Impedance Value: 410 Ohm
Lead Channel Pacing Threshold Amplitude: 0.625 V
Lead Channel Pacing Threshold Amplitude: 0.75 V
Lead Channel Pacing Threshold Pulse Width: 0.5 ms
Lead Channel Pacing Threshold Pulse Width: 0.5 ms
Lead Channel Sensing Intrinsic Amplitude: 11.1 mV
Lead Channel Sensing Intrinsic Amplitude: 2.6 mV
Lead Channel Setting Pacing Amplitude: 1.625
Lead Channel Setting Pacing Amplitude: 2.5 V
Lead Channel Setting Pacing Pulse Width: 0.5 ms
Lead Channel Setting Sensing Sensitivity: 2 mV
Pulse Gen Model: 2272
Pulse Gen Serial Number: 8939815

## 2020-08-30 NOTE — Progress Notes (Signed)
Remote pacemaker transmission.   

## 2020-09-19 DIAGNOSIS — M8949 Other hypertrophic osteoarthropathy, multiple sites: Secondary | ICD-10-CM | POA: Diagnosis not present

## 2020-09-19 DIAGNOSIS — Z79899 Other long term (current) drug therapy: Secondary | ICD-10-CM

## 2020-09-19 DIAGNOSIS — M1A39X0 Chronic gout due to renal impairment, multiple sites, without tophus (tophi): Secondary | ICD-10-CM | POA: Diagnosis not present

## 2020-09-19 HISTORY — DX: Other long term (current) drug therapy: Z79.899

## 2020-09-20 DIAGNOSIS — L821 Other seborrheic keratosis: Secondary | ICD-10-CM | POA: Diagnosis not present

## 2020-09-20 DIAGNOSIS — Z6832 Body mass index (BMI) 32.0-32.9, adult: Secondary | ICD-10-CM | POA: Diagnosis not present

## 2020-09-20 DIAGNOSIS — R748 Abnormal levels of other serum enzymes: Secondary | ICD-10-CM | POA: Diagnosis not present

## 2020-09-20 DIAGNOSIS — Z2821 Immunization not carried out because of patient refusal: Secondary | ICD-10-CM | POA: Diagnosis not present

## 2020-09-26 ENCOUNTER — Encounter: Payer: Self-pay | Admitting: Cardiology

## 2020-09-26 ENCOUNTER — Other Ambulatory Visit: Payer: Self-pay

## 2020-09-26 ENCOUNTER — Ambulatory Visit (INDEPENDENT_AMBULATORY_CARE_PROVIDER_SITE_OTHER): Payer: Medicare Other | Admitting: Cardiology

## 2020-09-26 VITALS — BP 168/76 | HR 80 | Ht 72.0 in | Wt 209.4 lb

## 2020-09-26 DIAGNOSIS — I2510 Atherosclerotic heart disease of native coronary artery without angina pectoris: Secondary | ICD-10-CM | POA: Diagnosis not present

## 2020-09-26 DIAGNOSIS — I480 Paroxysmal atrial fibrillation: Secondary | ICD-10-CM | POA: Diagnosis not present

## 2020-09-26 DIAGNOSIS — I739 Peripheral vascular disease, unspecified: Secondary | ICD-10-CM | POA: Diagnosis not present

## 2020-09-26 DIAGNOSIS — I272 Pulmonary hypertension, unspecified: Secondary | ICD-10-CM

## 2020-09-26 NOTE — Progress Notes (Signed)
Cardiology Office Note:    Date:  09/26/2020   ID:  Lawrence [name], DOB 1946-01-18, MRN [id number]  PCP:  [name], MD  Cardiologist:  [name], MD    Referring MD: [name], MD   No chief complaint on file. I am doing fine  History of Present Illness:    Lawrence [name] is a 74 y.o. male with complex past medical history which include coronary artery disease, status post coronary artery bypass graft, pacemaker present, cardiomyopathy with normalization of left ventricle ejection fraction, diabetes, essential hypertension, paroxysmal atrial fibrillation.  Comes today to my office for follow-up overall doing well but described to have some problem with his left lower extremities and he said this is the agent orange effect.  He had it for years, likely for last few months there was no problem but since yesterday he got a lot of burning around his left knee area.  Past Medical History:  Diagnosis Date  . Acute on chronic diastolic CHF (congestive heart failure) ([place]) 01/03/2018  . Age-related cataract of both eyes 05/08/2018  . Anemia   . Anemia, iron deficiency 12/28/2014  . Arteriosclerosis of coronary artery 05/26/2019   Formatting of this note might be different from the original. Apr 12, 2017 Entered By: [place] Comment: April, 2018 echocardiogram = normal  . Arthritis    knees  . Bradycardia with 31-40 beats per minute 08/21/2017  . CAP (community acquired pneumonia) 01/02/2018  . Cataracts, bilateral    removed   . Cervical spondylosis 05/08/2018  . CHF (congestive heart failure), NYHA class II, chronic, diastolic ([place]) 03/26/4741  . Chronic airway obstruction, not elsewhere classified    patient denies this dx on 12/04/19 - (Dr Melvyn Novas dx 02/2013)  . Chronic ischemic heart disease, unspecified   . Chronic obstructive pulmonary disease ([place]) 01/22/2013   Formatting of this note might be different from the original. Overview:  Followed in  Pulmonary clinic/ [facility]/ [place]  - PFTs 03/10/2013 FEV1  2.61 (86%) ratio 76 and no better p B2 with DLCO 66 corrects to 86%  Last Assessment & Plan:  - PFTs 03/10/2013 FEV1  2.61 (86%) ratio 76 and no better p B2 with DLCO 66 corrects to 86%  He does not have significant copd and never will.  I reviewed   . CKD (chronic kidney disease) stage 3, GFR 30-59 ml/min (HCC) 07/26/2018  . Coronary artery disease   . Coronary artery disease involving native coronary artery of native heart without angina pectoris 01/09/2016  . Diabetes mellitus    type II, neuropathy,   . 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 Dyslipidemia 01/09/2016  . ED (erectile dysfunction) of organic origin 07/05/2014  . Erectile dysfunction due to arterial insufficiency 07/20/2015  . Esophageal reflux 05/08/2018  . Essential hypertension 05/08/2018  . Essential hypertension, benign   . GI bleed 07/21/2018  . Gout 05/08/2018  . Hearing loss    wears hearing aids  . Heart failure ([place]) 05/08/2018  . Hyperlipidemia, mixed   . Mixed hyperlipidemia 05/08/2018  . Nodule of kidney    incidentally found on CT abdomen 11/2012.  Pending Urology evaluation.   . Nontoxic multinodular goiter   . Obesity   . Obstructive sleep apnea (adult) (pediatric) 01/09/2016  . Other testicular hypofunction   . Pacemaker 09/13/2017   St Jude Device   . Peripheral vascular disease ([place]) 01/09/2016   Formatting of this note might be different from the original.  Right SFA by segmental pressure, conservative management  . Permanent atrial fibrillation ([place])   . Peyronie's disease 07/20/2015  . Pneumonia    x 1  . Presence of permanent cardiac pacemaker    ST Jude PPM  . 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 PTSD (post-traumatic stress disorder) 05/08/2018  . PUD (peptic ulcer disease)   . Pulmonary hypertension ([place]) 01/09/2016  . Renal cyst, right 07/05/2014  . Renal insufficiency, mild    hx of  . Renal lesion 05/25/2014  .  Sensorineural hearing loss (SNHL) of both ears 05/08/2018  . Sleep apnea    Does not use CPAP in last 3 months per patient 12/04/19  . Small bowel ischemia w pneumatosis s/p ileocectomy 07/21/2018   . Stage 2 chronic kidney disease 05/08/2018  . Status post coronary artery bypass graft 01/09/2016  . Testicular hypofunction 05/08/2018  . Tinnitus of both ears 05/08/2018  . Type 2 diabetes mellitus with diabetic neuropathy ([place]) 05/08/2018    Past Surgical History:  Procedure Laterality Date  . ATRIAL FIBRILLATION ABLATION N/A 02/05/2019   Procedure: ATRIAL FIBRILLATION ABLATION;  Surgeon: Constance Haw, MD;  Location: [facility];  Service: Cardiovascular;  Laterality: N/A;  . CHOLECYSTECTOMY    . COLONOSCOPY  05/11/2015   Colonic polyps statuts post polypectomy. Moderate predominalty sigmoid diverticulosis. Internal hemorrhoids. Tubular adenoma.   . CORONARY ARTERY BYPASS GRAFT  2007   x 4  . ESOPHAGOGASTRODUODENOSCOPY     10/06/2012:  hiatal hernia, moderate gastritis.  2012: Colonoscopy at [facility]:  one polyp.  Due next 2017  . EYE SURGERY     left eye metal removed   . EYE SURGERY     bilateral cataracts removed  . INCISIONAL HERNIA REPAIR N/A 12/08/2019   Procedure: OPEN INCISIONAL HERNIA REPAIR WITH MESH;  Surgeon: Erroll Luna, MD;  Location: [facility];  Service: General;  Laterality: N/A;  . INGUINAL HERNIA REPAIR Left 12/08/2019   Procedure: HERNIA REPAIR LEFT  INGUINAL ADULT WITH MESH;  Surgeon: Erroll Luna, MD;  Location: [facility];  Service: General;  Laterality: Left;  . KNEE DEBRIDEMENT Right   . LAPAROTOMY N/A 07/21/2018   Procedure: EXPLORATORY LAPAROTOMY WITH SMALL BOWEL RESECTION;  Surgeon: Erroll Luna, MD;  Location: [place];  Service: General;  Laterality: N/A;  . PACEMAKER IMPLANT N/A 08/22/2017   Procedure: Pacemaker Implant; St. Jude PPM  Surgeon: Constance Haw, MD;  Location: [facility];  Service: Cardiovascular;  Laterality: N/A;  . SHOULDER  SURGERY     left shoulder  . TONSILECTOMY, ADENOIDECTOMY, BILATERAL MYRINGOTOMY AND TUBES    . WISDOM TOOTH EXTRACTION      Current Medications: Current Meds  Medication Sig  . allopurinol (ZYLOPRIM) 100 MG tablet Take 100 mg by mouth daily.  Marland Kitchen apixaban (ELIQUIS) 5 MG TABS tablet Take 5 mg by mouth 2 (two) times daily.  . Capsaicin 0.1 % CREA Apply 1 application topically at bedtime.   . Cholecalciferol (VITAMIN D3) 2000 units TABS Take 1,000 Units by mouth daily.   . cholestyramine (QUESTRAN) 4 g packet Take 4 g by mouth daily as needed (diarrhea).   . colchicine 0.6 MG tablet Take 0.6 mg by mouth daily.  . furosemide (LASIX) 40 MG tablet Take 40 mg by mouth daily.   Marland Kitchen glimepiride (AMARYL) 2 MG tablet Take 2 mg by mouth daily as needed (blood sugar 120 or higher).   . Ipratropium-Albuterol (COMBIVENT RESPIMAT) 20-100 MCG/ACT AERS respimat Inhale 1 puff  into the lungs daily as needed for wheezing or shortness of breath.   . lidocaine (LIDODERM) 5 % Place 1-3 patches onto the skin daily. Remove & Discard patch within 12 hours or as directed by MD   . metolazone (ZAROXOLYN) 2.5 MG tablet Take 2.5 mg by mouth as needed.  . metoprolol tartrate (LOPRESSOR) 100 MG tablet TAKE 1 TABLET TWICE A DAY ( DOSE INCREASE PER DR CAMNITZ )  . nitroGLYCERIN (NITROSTAT) 0.4 MG SL tablet Place 0.4 mg under the tongue every 5 (five) minutes as needed for chest pain.   . Oxcarbazepine (TRILEPTAL) 300 MG tablet Take 150-300 mg by mouth 2 (two) times daily. Take 150 mg by mouth in the morning and afternoon and 300 mg at bedtime  . OZEMPIC, 0.25 OR 0.5 MG/DOSE, 2 MG/1.5ML SOPN Inject 0.25 mg into the skin once a week.  . pantoprazole (PROTONIX) 40 MG tablet Take 40 mg by mouth daily before breakfast.   . simvastatin (ZOCOR) 20 MG tablet Take 20 mg by mouth at bedtime.   Marland Kitchen zolpidem (AMBIEN) 5 MG tablet Take 5 mg by mouth at bedtime as needed.     Allergies:   Ciprofloxacin, Sulfamethoxazole-trimethoprim,  Lisinopril, and Naproxen   Social History   Socioeconomic History  . Marital status: Married    Spouse name: Not on file  . Number of children: 5  . Years of education: Not on file  . Highest education level: Not on file  Occupational History  . Occupation: Retired    Comment: Medical illustrator; retired Chiropodist  Tobacco Use  . Smoking status: Former Smoker    Packs/day: 1.50    Years: 30.00    Pack years: 45.00    Types: Cigarettes    Quit date: 2006    Years since quitting: 15.7  . Smokeless tobacco: Former Systems developer    Types: Secondary school teacher  . Vaping Use: Never used  Substance and Sexual Activity  . Alcohol use: Yes    Comment: rare  . Drug use: No  . Sexual activity: Not on file  Other Topics Concern  . Not on file  Social History Narrative   ** Merged History Encounter **       Lives with wife in a one story home.  Has 6 children.  Retired Nature conservation officer.  Education: college.   Social Determinants of Health   Financial Resource Strain:   . Difficulty of Paying Living Expenses: Not on file  Food Insecurity:   . Worried About Charity fundraiser in the Last Year: Not on file  . Ran Out of Food in the Last Year: Not on file  Transportation Needs:   . Lack of Transportation (Medical): Not on file  . Lack of Transportation (Non-Medical): Not on file  Physical Activity:   . Days of Exercise per Week: Not on file  . Minutes of Exercise per Session: Not on file  Stress:   . Feeling of Stress : Not on file  Social Connections:   . Frequency of Communication with Friends and Family: Not on file  . Frequency of Social Gatherings with Friends and Family: Not on file  . Attends Religious Services: Not on file  . Active Member of Clubs or Organizations: Not on file  . Attends Archivist Meetings: Not on file  . Marital Status: Not on file     Family History: The patient's family history includes Asthma in his sister; Cancer in his father and mother; Colon  cancer in his mother; Emphysema in an other family member; Esophageal cancer in his father; Hypertension in his mother; Kidney cancer in his mother; Stroke in his father. ROS:   Please see the history of present illness.    All 14 point review of systems negative except as described per history of present illness  EKGs/Labs/Other Studies Reviewed:      Recent Labs: 12/04/2019: ALT 26; BUN 24; Creatinine, Ser 1.34; Hemoglobin 11.5; Platelets 208; Potassium 4.1; Sodium 139  Recent Lipid Panel No results found for: CHOL, TRIG, HDL, CHOLHDL, VLDL, LDLCALC, LDLDIRECT  Physical Exam:    VS:  BP (!) 168/76   Pulse 80   Ht 6' (1.829 m)   Wt 209 lb 6.4 oz (95 kg)   SpO2 98%   BMI 28.40 kg/m     Wt Readings from Last 3 Encounters:  09/26/20 209 lb 6.4 oz (95 kg)  05/26/20 209 lb (94.8 kg)  02/29/20 205 lb (93 kg)     GEN:  Well nourished, well developed in no acute distress HEENT: Normal NECK: No JVD; No carotid bruits LYMPHATICS: No lymphadenopathy CARDIAC: RRR, no murmurs, no rubs, no gallops RESPIRATORY:  Clear to auscultation without rales, wheezing or rhonchi  ABDOMEN: Soft, non-tender, non-distended MUSCULOSKELETAL:  No edema; No deformity  SKIN: Warm and dry LOWER EXTREMITIES: no swelling, pus in left lower extremities is weak but present NEUROLOGIC:  Alert and oriented x 3 PSYCHIATRIC:  Normal affect   ASSESSMENT:    1. Paroxysmal atrial fibrillation (HCC)   2. Coronary artery disease involving native coronary artery of native heart without angina pectoris   3. Peripheral vascular disease ([place])   4. Pulmonary hypertension ([place])    PLAN:    In order of problems listed above:  1. Paroxysmal atrial fibrillation: Denies having any palpitations, he is anticoagulated which I will continue. 2. Coronary artery disease stable on appropriate medication which I will continue. 3. Peripheral vascular disease, the key is risk factors modifications which we are doing and I will  continue. 4. Dyslipidemia: He is on moderate intensity statin which I will continue for now.  His last LDL I did review from [facility] was 72 with HDL of 49 but this is from January 2021.  I will call primary care physician to get fasting lipid profile which is more up-to-date. 5. History of pulmonary hypertension.  I will schedule [facility] next time to have echocardiogram done.   Medication Adjustments/Labs and Tests Ordered: Current medicines are reviewed at length with the patient today.  Concerns regarding medicines are outlined above.  No orders of the defined types were placed in this encounter.  Medication changes: No orders of the defined types were placed in this encounter.   Signed, Park Liter, MD, [facility] 09/26/2020 10:12 AM    [place]

## 2020-09-26 NOTE — Patient Instructions (Signed)

## 2020-10-14 ENCOUNTER — Encounter: Payer: Self-pay | Admitting: Gastroenterology

## 2020-10-19 ENCOUNTER — Ambulatory Visit: Payer: Medicare Other | Admitting: Gastroenterology

## 2020-11-11 ENCOUNTER — Ambulatory Visit: Payer: Medicare Other | Admitting: Gastroenterology

## 2020-11-16 DIAGNOSIS — S20212A Contusion of left front wall of thorax, initial encounter: Secondary | ICD-10-CM | POA: Diagnosis not present

## 2020-11-16 DIAGNOSIS — Z6833 Body mass index (BMI) 33.0-33.9, adult: Secondary | ICD-10-CM | POA: Diagnosis not present

## 2020-11-16 DIAGNOSIS — I10 Essential (primary) hypertension: Secondary | ICD-10-CM | POA: Diagnosis not present

## 2020-11-25 ENCOUNTER — Ambulatory Visit (INDEPENDENT_AMBULATORY_CARE_PROVIDER_SITE_OTHER): Payer: Medicare Other

## 2020-11-25 DIAGNOSIS — I4821 Permanent atrial fibrillation: Secondary | ICD-10-CM | POA: Diagnosis not present

## 2020-11-26 LAB — CUP PACEART REMOTE DEVICE CHECK
Battery Remaining Longevity: 107 mo
Battery Remaining Percentage: 95.5 %
Battery Voltage: 2.99 V
Brady Statistic AP VP Percent: 16 %
Brady Statistic AP VS Percent: 76 %
Brady Statistic AS VP Percent: 1 %
Brady Statistic AS VS Percent: 7.4 %
Brady Statistic RA Percent Paced: 75 %
Brady Statistic RV Percent Paced: 17 %
Date Time Interrogation Session: 20211203020014
Implantable Lead Implant Date: 20180830
Implantable Lead Implant Date: 20180830
Implantable Lead Location: 753859
Implantable Lead Location: 753860
Implantable Pulse Generator Implant Date: 20180830
Lead Channel Impedance Value: 360 Ohm
Lead Channel Impedance Value: 410 Ohm
Lead Channel Pacing Threshold Amplitude: 0.75 V
Lead Channel Pacing Threshold Amplitude: 0.75 V
Lead Channel Pacing Threshold Pulse Width: 0.5 ms
Lead Channel Pacing Threshold Pulse Width: 0.5 ms
Lead Channel Sensing Intrinsic Amplitude: 1.6 mV
Lead Channel Sensing Intrinsic Amplitude: 8.5 mV
Lead Channel Setting Pacing Amplitude: 1.75 V
Lead Channel Setting Pacing Amplitude: 2.5 V
Lead Channel Setting Pacing Pulse Width: 0.5 ms
Lead Channel Setting Sensing Sensitivity: 2 mV
Pulse Gen Model: 2272
Pulse Gen Serial Number: 8939815

## 2020-12-02 ENCOUNTER — Encounter: Payer: Self-pay | Admitting: Gastroenterology

## 2020-12-02 ENCOUNTER — Ambulatory Visit (INDEPENDENT_AMBULATORY_CARE_PROVIDER_SITE_OTHER): Payer: Medicare Other | Admitting: Gastroenterology

## 2020-12-02 VITALS — BP 118/72 | HR 63 | Ht 72.0 in | Wt 192.0 lb

## 2020-12-02 DIAGNOSIS — K219 Gastro-esophageal reflux disease without esophagitis: Secondary | ICD-10-CM

## 2020-12-02 DIAGNOSIS — K7581 Nonalcoholic steatohepatitis (NASH): Secondary | ICD-10-CM

## 2020-12-02 DIAGNOSIS — Z9049 Acquired absence of other specified parts of digestive tract: Secondary | ICD-10-CM | POA: Diagnosis not present

## 2020-12-02 DIAGNOSIS — K7460 Unspecified cirrhosis of liver: Secondary | ICD-10-CM

## 2020-12-02 DIAGNOSIS — R945 Abnormal results of liver function studies: Secondary | ICD-10-CM

## 2020-12-02 DIAGNOSIS — K449 Diaphragmatic hernia without obstruction or gangrene: Secondary | ICD-10-CM

## 2020-12-02 DIAGNOSIS — Z800 Family history of malignant neoplasm of digestive organs: Secondary | ICD-10-CM | POA: Diagnosis not present

## 2020-12-02 DIAGNOSIS — K274 Chronic or unspecified peptic ulcer, site unspecified, with hemorrhage: Secondary | ICD-10-CM

## 2020-12-02 DIAGNOSIS — R7989 Other specified abnormal findings of blood chemistry: Secondary | ICD-10-CM

## 2020-12-02 NOTE — Patient Instructions (Addendum)
If you are age 75 or older, your body mass index should be between 23-30. Your Body mass index is 26.04 kg/m. If this is out of the aforementioned range listed, please consider follow up with your Primary Care Provider.  If you are age 32 or younger, your body mass index should be between 19-25. Your Body mass index is 26.04 kg/m. If this is out of the aformentioned range listed, please consider follow up with your Primary Care Provider.    Your provider has requested that you have lab . We ask that you go to our [facility] office at [address], [place],  [address]. Please enter through the main entrance and go to the elevator.  Press "B" on the elevator. The lab is located at the first door on the left as you exit the elevator.  We will call you on Monday with a time for your abdominal ultrasound  You have been scheduled for an abdominal ultrasound  Radiology (1st floor of hospital) on --------at ---------. Please arrive 15 minutes prior to your appointment for registration. Make certain not to have anything to eat or drink 6 hours prior to your appointment. Should you need to reschedule your appointment, please contact radiology at [phone]. This test typically takes about 30 minutes to perform.    Due to recent changes in healthcare laws, you may see the results  of your imaging and laboratory studies on MyChart before your provider has had a chance to review them.  We understand that in some cases there may be results that are confusing or concerning to you. Not all laboratory results come back in the same time frame and the provider may be waiting for multiple results in order to interpret others.  Please give [place] 48 hours in order for your provider to thoroughly review all the results before contacting the office for clarification of your results.

## 2020-12-02 NOTE — Progress Notes (Signed)
Chief Complaint:   Referring Provider:  [name], MD      ASSESSMENT AND PLAN;   #1. 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   #2. 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  #3.  GERD with small hiatal hernia.  #4.  Family history of colon cancer (mother>62yr and family history of esophageal cancer (father)  #5.  Ventral incisional hernia.  Conservative management for now.   Plan: -[place] complete -Check CBC, CMP, acute viral hepatitis, autoimmune hepatitis panel (AMA, ASMA), Alk phoa isoenzymes iron studies, serum ceruloplasmin, celiac screen, and GGT. -Check ammonia, alpha-fetoprotein and PT INR. -Check anti-HAV total Ab and HBsAb.  If neg, would recommend vaccination for hepatitis A and B. -Wt loss -He wants to hold off on liver biopsy.  I do agree -FU in 6 months -Records from Dr RLin [name]-Discussed with patient and patient's wife in detail.    HPI:    GDrako Maeseis a 74y.o. male  With multiple comorbid conditions as listed below  S/P X-Lap with distal TI/right hemicolectomy 06/2018. Has done extremely well after the surgery. No further GI bleeding thereafter despite Eliquis.  He had not required any further blood transfusions.  Here for abnormal LFTs.  We do not have the records currently.  He was also told that he had abnormal LFTs prior to above surgery.  As below, the alk phos is increased.   No H/O itching, skin lesions, easy bruisability, intake of OTC meds including diet pills, herbal medications, anabolic steroids or Tylenol. There is no H/O blood transfusions, IVDA or FH of liver disease. No jaundice, dark urine or pale stools. No alcohol abuse.   No nausea, vomiting, heartburn, regurgitation, odynophagia or dysphagia.  No significant diarrhea or constipation. No melena or hematochezia. No unintentional weight loss. No abdominal pain.   Previous GI procedures and work-Up -H/O Obscure recurrent GI Bleeding (likely  d/t SB AVMs) with A. Fib/CHF on Eliquis. Neg EGD except for small [place]2013 and neg colon except for colonic polyps and mod sig div 04/2015. VCE 12/23/2012 showed few small bowel AVMs, limited examination. Also with mild chronic renal insufficiency (Cr 1.9) negative CT scan  10/2019, 11/2012 except for indeterminate right renal lesion and ventral abdominal wall hernia. RESOLVED   CT AP 10/2019 1. Moderate-sized ventral hernia at the superior aspect of a midline incision containing herniated small bowel loops and fat without obstruction. 2. Mild diffuse wall thickening involving the dependent gastric fundus and antrum. This may be due to chronic gastritis. This is not have the appearance of malignancy. 3. Sigmoid diverticulosis. 4. Changes of cirrhosis of the liver with borderline splenomegaly. 5. Mild prostatic hypertrophy. 6. Small left inguinal hernia containing fat.   Past Medical History:  Diagnosis Date  . Acute on chronic diastolic CHF (congestive heart failure) ([place] 01/03/2018  . Age-related cataract of both eyes 05/08/2018  . Anemia   . Anemia, iron deficiency 12/28/2014  . Arteriosclerosis of coronary artery 05/26/2019   Formatting of this note might be different from the original. Apr 12, 2017 Entered By: Lawrence PughComment: April, 2018 echocardiogram = normal  . Arthritis    knees  . Bradycardia with 31-40 beats per minute 08/21/2017  . CAP (community acquired pneumonia) 01/02/2018  . Cataracts, bilateral    removed   . Cervical spondylosis 05/08/2018  . CHF (congestive heart failure), NYHA class II, chronic, diastolic ([place] [id number] . Chronic airway obstruction, not elsewhere  classified    patient denies this dx on 12/04/19 - (Dr Lawrence [name] dx 02/2013)  . Chronic ischemic heart disease, unspecified   . Chronic obstructive pulmonary disease ([place]) 01/22/2013   Formatting of this note might be different from the original. Overview:  Followed in Pulmonary clinic/ [facility]/  Lawrence [name]  - PFTs 03/10/2013 FEV1  2.61 (86%) ratio 76 and no better p B2 with DLCO 66 corrects to 86%  Last Assessment & Plan:  - PFTs 03/10/2013 FEV1  2.61 (86%) ratio 76 and no better p B2 with DLCO 66 corrects to 86%  He does not have significant copd and never will.  I reviewed   . CKD (chronic kidney disease) stage 3, GFR 30-59 ml/min (HCC) 07/26/2018  . Coronary artery disease   . Coronary artery disease involving native coronary artery of native heart without angina pectoris 01/09/2016  . Diabetes mellitus    type II, neuropathy,   . 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 Dyslipidemia 01/09/2016  . ED (erectile dysfunction) of organic origin 07/05/2014  . Erectile dysfunction due to arterial insufficiency 07/20/2015  . Esophageal reflux 05/08/2018  . Essential hypertension 05/08/2018  . Essential hypertension, benign   . GI bleed 07/21/2018  . Gout 05/08/2018  . Hearing loss    wears hearing aids  . Heart failure ([place]) 05/08/2018  . Hyperlipidemia, mixed   . Mixed hyperlipidemia 05/08/2018  . Nodule of kidney    incidentally found on CT abdomen 11/2012.  Pending Urology evaluation.   . Nontoxic multinodular goiter   . Obesity   . Obstructive sleep apnea (adult) (pediatric) 01/09/2016  . Other testicular hypofunction   . Pacemaker 09/13/2017   St Jude Device   . Peripheral vascular disease ([place]) 01/09/2016   Formatting of this note might be different from the original. Right SFA by segmental pressure, conservative management  . Permanent atrial fibrillation ([place])   . Peyronie's disease 07/20/2015  . Pneumonia    x 1  . Presence of permanent cardiac pacemaker    ST Jude PPM  . 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 PTSD (post-traumatic stress disorder) 05/08/2018  . PUD (peptic ulcer disease)   . Pulmonary hypertension ([place]) 01/09/2016  . Renal cyst, right 07/05/2014  . Renal insufficiency, mild    hx of  . Renal lesion 05/25/2014  . Sensorineural hearing loss (SNHL) of  both ears 05/08/2018  . Sleep apnea    Does not use CPAP in last 3 months per patient 12/04/19  . Small bowel ischemia w pneumatosis s/p ileocectomy 07/21/2018   . Stage 2 chronic kidney disease 05/08/2018  . Status post coronary artery bypass graft 01/09/2016  . Testicular hypofunction 05/08/2018  . Tinnitus of both ears 05/08/2018  . Type 2 diabetes mellitus with diabetic neuropathy ([place]) 05/08/2018    Past Surgical History:  Procedure Laterality Date  . ATRIAL FIBRILLATION ABLATION N/A 02/05/2019   Procedure: ATRIAL FIBRILLATION ABLATION;  Surgeon: Constance Haw, MD;  Location: [place] [facility];  Service: Cardiovascular;  Laterality: N/A;  . CHOLECYSTECTOMY    . COLONOSCOPY  05/11/2015   Colonic polyps statuts post polypectomy. Moderate predominalty sigmoid diverticulosis. Internal hemorrhoids. Tubular adenoma.   . CORONARY ARTERY BYPASS GRAFT  2007   x 4  . ESOPHAGOGASTRODUODENOSCOPY     10/06/2012:  hiatal hernia, moderate gastritis.  2012: Colon[facility] at [facility]:  one polyp.  Due next 2017  . EYE SURGERY     left eye metal removed   . EYE  SURGERY     bilateral cataracts removed  . INCISIONAL HERNIA REPAIR N/A 12/08/2019   Procedure: OPEN INCISIONAL HERNIA REPAIR WITH MESH;  Surgeon: Erroll Luna, MD;  Location: [place];  Service: General;  Laterality: N/A;  . INGUINAL HERNIA REPAIR Left 12/08/2019   Procedure: HERNIA REPAIR LEFT  INGUINAL ADULT WITH MESH;  Surgeon: Erroll Luna, MD;  Location: [place];  Service: General;  Laterality: Left;  . KNEE DEBRIDEMENT Right   . LAPAROTOMY N/A 07/21/2018   Procedure: EXPLORATORY LAPAROTOMY WITH SMALL BOWEL RESECTION;  Surgeon: Erroll Luna, MD;  Location: [place];  Service: General;  Laterality: N/A;  . PACEMAKER IMPLANT N/A 08/22/2017   Procedure: Pacemaker Implant; St. Jude PPM  Surgeon: Constance Haw, MD;  Location: [place] [facility];  Service: Cardiovascular;  Laterality: N/A;  . SHOULDER SURGERY     left shoulder  .  TONSILECTOMY, ADENOIDECTOMY, BILATERAL MYRINGOTOMY AND TUBES    . WISDOM TOOTH EXTRACTION      Family History  Problem Relation Age of Onset  . Colon cancer Mother   . Hypertension Mother   . Kidney cancer Mother   . Cancer Mother        colon  . Esophageal cancer Father   . Stroke Father   . Cancer Father        esophageal  . Asthma Sister   . Emphysema Other     Social History   Tobacco Use  . Smoking status: Former Smoker    Packs/day: 1.50    Years: 30.00    Pack years: 45.00    Types: Cigarettes    Quit date: 2006    Years since quitting: 15.9  . Smokeless tobacco: Former Systems developer    Types: Secondary school teacher  . Vaping Use: Never used  Substance Use Topics  . Alcohol use: Yes    Comment: rare  . Drug use: No    Current Outpatient Medications  Medication Sig Dispense Refill  . allopurinol (ZYLOPRIM) 100 MG tablet Take 100 mg by mouth daily.    Marland Kitchen apixaban (ELIQUIS) 5 MG TABS tablet Take 5 mg by mouth 2 (two) times daily.    . Capsaicin 0.1 % CREA Apply 1 application topically at bedtime.     . Cholecalciferol (VITAMIN D3) 2000 units TABS Take 1,000 Units by mouth daily.     . cholestyramine (QUESTRAN) 4 g packet Take 4 g by mouth daily as needed (diarrhea).     . colchicine 0.6 MG tablet Take 0.6 mg by mouth daily.    . furosemide (LASIX) 40 MG tablet Take 40 mg by mouth daily.     . Ipratropium-Albuterol (COMBIVENT) 20-100 MCG/ACT AERS respimat Inhale 1 puff into the lungs daily as needed for wheezing or shortness of breath.     . lidocaine (LIDODERM) 5 % Place 1-3 patches onto the skin daily. Remove & Discard patch within 12 hours or as directed by MD    . metolazone (ZAROXOLYN) 2.5 MG tablet Take 2.5 mg by mouth as needed.    . metoprolol tartrate (LOPRESSOR) 100 MG tablet TAKE 1 TABLET TWICE A DAY ( DOSE INCREASE PER DR CAMNITZ ) 180 tablet 2  . nitroGLYCERIN (NITROSTAT) 0.4 MG SL tablet Place 0.4 mg under the tongue every 5 (five) minutes as needed for chest  pain.     . Oxcarbazepine (TRILEPTAL) 300 MG tablet Take 150-300 mg by mouth 2 (two) times daily. Take 150 mg by mouth in the morning and afternoon  and 300 mg at bedtime    . OZEMPIC, 0.25 OR 0.5 MG/DOSE, 2 MG/1.5ML SOPN Inject 0.25 mg into the skin once a week.    . pantoprazole (PROTONIX) 40 MG tablet Take 40 mg by mouth daily before breakfast.     . simvastatin (ZOCOR) 20 MG tablet Take 20 mg by mouth at bedtime.     Marland Kitchen zolpidem (AMBIEN) 5 MG tablet Take 5 mg by mouth at bedtime as needed.     No current facility-administered medications for this visit.    Allergies  Allergen Reactions  . Ciprofloxacin Other (See Comments)    Atrial fib,  renal failure  . Sulfamethoxazole-Trimethoprim Other (See Comments)    Atrial fib,  renal failure, potassium level spiked   . Lisinopril Nausea And Vomiting and Other (See Comments)    GI Upset  . Naproxen Nausea And Vomiting and Other (See Comments)    GI Upset    Review of Systems:  Constitutional: Denies fever, chills, diaphoresis, appetite change, has  fatigue.  HEENT: Denies photophobia, eye pain, redness, hearing loss, ear pain, congestion, sore throat, rhinorrhea, sneezing, mouth sores, neck pain, neck stiffness and tinnitus.   Respiratory: Denies SOB, DOE, cough, chest tightness,  and wheezing. Has COPD Cardiovascular: Denies chest pain, palpitations and leg swelling.  Genitourinary: Denies dysuria, urgency, frequency, hematuria, flank pain and difficulty urinating.  Musculoskeletal: Denies myalgias, back pain, joint swelling, arthralgias and has gait problem.  Skin: No rash.  Neurological: Denies dizziness, seizures, syncope, weakness, light-headedness, numbness and headaches.  Hematological: Denies adenopathy. Has Easy bruising. Psychiatric/Behavioral: No anxiety or depression     Physical Exam:    BP 118/72   Pulse 63   Ht 6' (1.829 m)   Wt 192 lb (87.1 kg)   BMI 26.04 kg/m  Filed Weights   12/02/20 1428  Weight: 192 lb  (87.1 kg)   Constitutional:  Well-developed, in no acute distress. Psychiatric: Normal mood and affect. Behavior is normal. HEENT: Pupils normal.  Conjunctivae are normal. No scleral icterus. Neck supple.  Cardiovascular: Normal rate, regular rhythm. No edema.  Pacemaker. Pulmonary/chest: Decreased bilateral breath sounds. Abdominal: Soft, nondistended. Nontender. Bowel sounds active throughout. There are no masses palpable. No hepatomegaly. Has ventral hernia Rectal:  defered Neurological: Alert and oriented to person place and time. Skin: Skin is warm and dry. No rashes noted. Has left knee brace.  Data Reviewed: I have personally reviewed following labs and imaging studies  CBC: CBC Latest Ref Rng & Units 12/04/2019 01/27/2019 07/31/2018  WBC 4.0 - 10.5 K/uL 8.8 12.3(H) 12.3(H)  Hemoglobin 13.0 - 17.0 g/dL 11.5(L) 11.7(L) 8.9(L)  Hematocrit 39.0 - 52.0 % 36.0(L) 35.2(L) 28.9(L)  Platelets 150 - 400 K/uL 208 349 294    CMP: CMP Latest Ref Rng & Units 12/04/2019 05/11/2019 02/02/2019  Glucose 70 - 99 mg/dL 169(H) 141(H) 215(H)  BUN 8 - 23 mg/dL 24(H) 29(H) 25  Creatinine 0.61 - 1.24 mg/dL 1.34(H) 1.30(H) 1.20  Sodium 135 - 145 mmol/L 139 140 138  Potassium 3.5 - 5.1 mmol/L 4.1 5.0 4.9  Chloride 98 - 111 mmol/L 107 105 101  CO2 22 - 32 mmol/L 22 21 25   Calcium 8.9 - 10.3 mg/dL 8.8(L) 9.5 9.8  Total Protein 6.5 - 8.1 g/dL 7.0 - -  Total Bilirubin 0.3 - 1.2 mg/dL 0.9 - -  Alkaline Phos 38 - 126 U/L 198(H) - -  AST 15 - 41 U/L 24 - -  ALT 0 - 44 U/L 26 - -  Hepatic Function Latest Ref Rng & Units 12/08/2020 12/04/2019 07/21/2018  Total Protein 6.5 - 8.1 g/dL - 7.0 6.7  Albumin 3.5 - 5.0 g/dL - 3.6 3.3(L)  AST 15 - 41 U/L - 24 49(H)  ALT 0 - 44 U/L - 26 33  Alk Phosphatase 44 - 121 IU/L 339(H) 198(H) 239(H)  Total Bilirubin 0.3 - 1.2 mg/dL - 0.9 1.0  Bilirubin, Direct 0.1 - 0.5 mg/dL - - -      Carmell Austria, MD 12/02/2020, 2:36 PM  Cc: Lawrence Sheriff, MD

## 2020-12-06 NOTE — Progress Notes (Signed)
Remote pacemaker transmission.   

## 2020-12-08 ENCOUNTER — Other Ambulatory Visit: Payer: Self-pay

## 2020-12-08 ENCOUNTER — Ambulatory Visit (HOSPITAL_BASED_OUTPATIENT_CLINIC_OR_DEPARTMENT_OTHER): Payer: Medicare Other

## 2020-12-08 ENCOUNTER — Other Ambulatory Visit (INDEPENDENT_AMBULATORY_CARE_PROVIDER_SITE_OTHER): Payer: Medicare Other

## 2020-12-08 DIAGNOSIS — R7989 Other specified abnormal findings of blood chemistry: Secondary | ICD-10-CM

## 2020-12-08 DIAGNOSIS — R945 Abnormal results of liver function studies: Secondary | ICD-10-CM | POA: Diagnosis not present

## 2020-12-08 LAB — PROTIME-INR
INR: 1.2 ratio — ABNORMAL HIGH (ref 0.8–1.0)
Prothrombin Time: 13.7 s — ABNORMAL HIGH (ref 9.6–13.1)

## 2020-12-08 LAB — GAMMA GT: GGT: 543 U/L — ABNORMAL HIGH (ref 7–51)

## 2020-12-12 LAB — ALKALINE PHOSPHATASE, ISOENZYMES
Alkaline Phosphatase: 339 IU/L — ABNORMAL HIGH (ref 44–121)
BONE FRACTION: 25 % (ref 12–68)
INTESTINAL FRAC.: 0 % (ref 0–18)
LIVER FRACTION: 75 % (ref 13–88)

## 2020-12-12 LAB — CELIAC PANEL 10
Antigliadin Abs, IgA: 4 units (ref 0–19)
Endomysial IgA: NEGATIVE
Gliadin IgG: 1 units (ref 0–19)
IgA/Immunoglobulin A, Serum: 197 mg/dL (ref 61–437)
Tissue Transglut Ab: <2 U/mL (ref 0–5)
Transglutaminase IgA: <2 U/mL (ref 0–3)

## 2020-12-13 LAB — HEPATITIS PANEL, ACUTE
Hep A IgM: NONREACTIVE
Hep B C IgM: NONREACTIVE
Hepatitis B Surface Ag: NONREACTIVE
Hepatitis C Ab: NONREACTIVE
SIGNAL TO CUT-OFF: 0.01 (ref <1.00)

## 2020-12-13 LAB — ANTI-SMOOTH MUSCLE ANTIBODY, IGG: Actin (Smooth Muscle) Antibody (IGG): <20 U (ref <20)

## 2020-12-13 LAB — MITOCHONDRIAL ANTIBODIES: Mitochondrial M2 Ab, IgG: <=20 U

## 2020-12-13 LAB — HEPATITIS A ANTIBODY, TOTAL: Hepatitis A AB,Total: NONREACTIVE

## 2020-12-13 LAB — HEPATITIS B SURFACE ANTIBODY,QUALITATIVE: Hep B S Ab: NONREACTIVE

## 2020-12-13 LAB — AFP TUMOR MARKER: AFP-Tumor Marker: 1.5 ng/mL (ref <6.1)

## 2020-12-13 LAB — CERULOPLASMIN: Ceruloplasmin: 37 mg/dL — ABNORMAL HIGH (ref 18–36)

## 2020-12-14 ENCOUNTER — Other Ambulatory Visit: Payer: Self-pay | Admitting: Gastroenterology

## 2020-12-14 DIAGNOSIS — R7989 Other specified abnormal findings of blood chemistry: Secondary | ICD-10-CM

## 2020-12-29 ENCOUNTER — Ambulatory Visit (INDEPENDENT_AMBULATORY_CARE_PROVIDER_SITE_OTHER): Payer: Medicare Other | Admitting: Gastroenterology

## 2020-12-29 ENCOUNTER — Other Ambulatory Visit: Payer: Self-pay

## 2020-12-29 ENCOUNTER — Ambulatory Visit (HOSPITAL_BASED_OUTPATIENT_CLINIC_OR_DEPARTMENT_OTHER)
Admission: RE | Admit: 2020-12-29 | Discharge: 2020-12-29 | Disposition: A | Discharge status: Home / Self Care | Payer: Medicare Other | Source: Ambulatory Visit | Attending: Gastroenterology | Admitting: Gastroenterology

## 2020-12-29 DIAGNOSIS — K449 Diaphragmatic hernia without obstruction or gangrene: Secondary | ICD-10-CM | POA: Insufficient documentation

## 2020-12-29 DIAGNOSIS — R945 Abnormal results of liver function studies: Secondary | ICD-10-CM | POA: Diagnosis not present

## 2020-12-29 DIAGNOSIS — Z800 Family history of malignant neoplasm of digestive organs: Secondary | ICD-10-CM

## 2020-12-29 DIAGNOSIS — K7460 Unspecified cirrhosis of liver: Secondary | ICD-10-CM

## 2020-12-29 DIAGNOSIS — K219 Gastro-esophageal reflux disease without esophagitis: Secondary | ICD-10-CM | POA: Diagnosis not present

## 2020-12-29 DIAGNOSIS — K7469 Other cirrhosis of liver: Secondary | ICD-10-CM | POA: Diagnosis not present

## 2020-12-29 DIAGNOSIS — K7581 Nonalcoholic steatohepatitis (NASH): Secondary | ICD-10-CM | POA: Diagnosis not present

## 2020-12-29 DIAGNOSIS — Z23 Encounter for immunization: Secondary | ICD-10-CM | POA: Diagnosis not present

## 2020-12-29 DIAGNOSIS — K274 Chronic or unspecified peptic ulcer, site unspecified, with hemorrhage: Secondary | ICD-10-CM | POA: Insufficient documentation

## 2020-12-29 DIAGNOSIS — R161 Splenomegaly, not elsewhere classified: Secondary | ICD-10-CM | POA: Diagnosis not present

## 2020-12-29 DIAGNOSIS — R7989 Other specified abnormal findings of blood chemistry: Secondary | ICD-10-CM

## 2021-01-27 DIAGNOSIS — K432 Incisional hernia without obstruction or gangrene: Secondary | ICD-10-CM | POA: Diagnosis not present

## 2021-01-31 ENCOUNTER — Ambulatory Visit (INDEPENDENT_AMBULATORY_CARE_PROVIDER_SITE_OTHER): Payer: Medicare Other | Admitting: Gastroenterology

## 2021-01-31 DIAGNOSIS — E1122 Type 2 diabetes mellitus with diabetic chronic kidney disease: Secondary | ICD-10-CM | POA: Diagnosis not present

## 2021-01-31 DIAGNOSIS — Z950 Presence of cardiac pacemaker: Secondary | ICD-10-CM | POA: Diagnosis not present

## 2021-01-31 DIAGNOSIS — I129 Hypertensive chronic kidney disease with stage 1 through stage 4 chronic kidney disease, or unspecified chronic kidney disease: Secondary | ICD-10-CM | POA: Diagnosis not present

## 2021-01-31 DIAGNOSIS — N179 Acute kidney failure, unspecified: Secondary | ICD-10-CM | POA: Diagnosis not present

## 2021-01-31 DIAGNOSIS — K7581 Nonalcoholic steatohepatitis (NASH): Secondary | ICD-10-CM | POA: Diagnosis not present

## 2021-01-31 DIAGNOSIS — I480 Paroxysmal atrial fibrillation: Secondary | ICD-10-CM | POA: Diagnosis not present

## 2021-01-31 DIAGNOSIS — N1831 Chronic kidney disease, stage 3a: Secondary | ICD-10-CM | POA: Diagnosis not present

## 2021-01-31 DIAGNOSIS — E559 Vitamin D deficiency, unspecified: Secondary | ICD-10-CM | POA: Diagnosis not present

## 2021-01-31 DIAGNOSIS — Z23 Encounter for immunization: Secondary | ICD-10-CM

## 2021-01-31 DIAGNOSIS — I509 Heart failure, unspecified: Secondary | ICD-10-CM | POA: Diagnosis not present

## 2021-01-31 DIAGNOSIS — D631 Anemia in chronic kidney disease: Secondary | ICD-10-CM | POA: Diagnosis not present

## 2021-01-31 DIAGNOSIS — Z7901 Long term (current) use of anticoagulants: Secondary | ICD-10-CM | POA: Diagnosis not present

## 2021-01-31 DIAGNOSIS — K922 Gastrointestinal hemorrhage, unspecified: Secondary | ICD-10-CM | POA: Diagnosis not present

## 2021-02-01 NOTE — Progress Notes (Signed)
See vaccine nursing notes

## 2021-02-15 DIAGNOSIS — L603 Nail dystrophy: Secondary | ICD-10-CM | POA: Insufficient documentation

## 2021-02-15 DIAGNOSIS — J189 Pneumonia, unspecified organism: Secondary | ICD-10-CM | POA: Insufficient documentation

## 2021-02-15 DIAGNOSIS — N2889 Other specified disorders of kidney and ureter: Secondary | ICD-10-CM | POA: Insufficient documentation

## 2021-02-15 DIAGNOSIS — E669 Obesity, unspecified: Secondary | ICD-10-CM | POA: Insufficient documentation

## 2021-02-15 DIAGNOSIS — H259 Unspecified age-related cataract: Secondary | ICD-10-CM

## 2021-02-15 DIAGNOSIS — I259 Chronic ischemic heart disease, unspecified: Secondary | ICD-10-CM | POA: Insufficient documentation

## 2021-02-15 DIAGNOSIS — E041 Nontoxic single thyroid nodule: Secondary | ICD-10-CM

## 2021-02-15 DIAGNOSIS — H269 Unspecified cataract: Secondary | ICD-10-CM | POA: Insufficient documentation

## 2021-02-15 DIAGNOSIS — N289 Disorder of kidney and ureter, unspecified: Secondary | ICD-10-CM | POA: Insufficient documentation

## 2021-02-15 DIAGNOSIS — E042 Nontoxic multinodular goiter: Secondary | ICD-10-CM | POA: Insufficient documentation

## 2021-02-15 DIAGNOSIS — K279 Peptic ulcer, site unspecified, unspecified as acute or chronic, without hemorrhage or perforation: Secondary | ICD-10-CM | POA: Insufficient documentation

## 2021-02-15 DIAGNOSIS — I779 Disorder of arteries and arterioles, unspecified: Secondary | ICD-10-CM | POA: Insufficient documentation

## 2021-02-15 DIAGNOSIS — G4730 Sleep apnea, unspecified: Secondary | ICD-10-CM | POA: Insufficient documentation

## 2021-02-15 DIAGNOSIS — T50905A Adverse effect of unspecified drugs, medicaments and biological substances, initial encounter: Secondary | ICD-10-CM | POA: Insufficient documentation

## 2021-02-15 DIAGNOSIS — K5730 Diverticulosis of large intestine without perforation or abscess without bleeding: Secondary | ICD-10-CM | POA: Insufficient documentation

## 2021-02-15 DIAGNOSIS — M1990 Unspecified osteoarthritis, unspecified site: Secondary | ICD-10-CM | POA: Insufficient documentation

## 2021-02-15 DIAGNOSIS — N179 Acute kidney failure, unspecified: Secondary | ICD-10-CM

## 2021-02-15 DIAGNOSIS — M542 Cervicalgia: Secondary | ICD-10-CM

## 2021-02-15 DIAGNOSIS — M7750 Other enthesopathy of unspecified foot: Secondary | ICD-10-CM

## 2021-02-15 DIAGNOSIS — M7730 Calcaneal spur, unspecified foot: Secondary | ICD-10-CM

## 2021-02-15 DIAGNOSIS — Z950 Presence of cardiac pacemaker: Secondary | ICD-10-CM | POA: Insufficient documentation

## 2021-02-15 DIAGNOSIS — D751 Secondary polycythemia: Secondary | ICD-10-CM

## 2021-02-15 DIAGNOSIS — H9190 Unspecified hearing loss, unspecified ear: Secondary | ICD-10-CM | POA: Insufficient documentation

## 2021-02-15 DIAGNOSIS — D126 Benign neoplasm of colon, unspecified: Secondary | ICD-10-CM

## 2021-02-15 DIAGNOSIS — IMO0002 Reserved for concepts with insufficient information to code with codable children: Secondary | ICD-10-CM | POA: Insufficient documentation

## 2021-02-15 HISTORY — DX: Reserved for concepts with insufficient information to code with codable children: IMO0002

## 2021-02-15 HISTORY — DX: Diverticulosis of large intestine without perforation or abscess without bleeding: K57.30

## 2021-02-15 HISTORY — DX: Benign neoplasm of colon, unspecified: D12.6

## 2021-02-15 HISTORY — DX: Disorder of arteries and arterioles, unspecified: I77.9

## 2021-02-15 HISTORY — DX: Calcaneal spur, unspecified foot: M77.30

## 2021-02-15 HISTORY — DX: Cervicalgia: M54.2

## 2021-02-15 HISTORY — DX: Unspecified age-related cataract: H25.9

## 2021-02-15 HISTORY — DX: Adverse effect of unspecified drugs, medicaments and biological substances, initial encounter: N17.9

## 2021-02-15 HISTORY — DX: Other enthesopathy of unspecified foot and ankle: M77.50

## 2021-02-15 HISTORY — DX: Acute kidney failure, unspecified: T50.905A

## 2021-02-15 HISTORY — DX: Secondary polycythemia: D75.1

## 2021-02-15 HISTORY — DX: Nail dystrophy: L60.3

## 2021-02-15 HISTORY — DX: Nontoxic single thyroid nodule: E04.1

## 2021-02-16 ENCOUNTER — Other Ambulatory Visit: Payer: Self-pay

## 2021-02-24 ENCOUNTER — Encounter: Payer: Self-pay | Admitting: Cardiology

## 2021-02-24 ENCOUNTER — Ambulatory Visit (INDEPENDENT_AMBULATORY_CARE_PROVIDER_SITE_OTHER): Payer: Medicare Other | Admitting: Cardiology

## 2021-02-24 ENCOUNTER — Ambulatory Visit (INDEPENDENT_AMBULATORY_CARE_PROVIDER_SITE_OTHER): Payer: Medicare Other

## 2021-02-24 ENCOUNTER — Other Ambulatory Visit: Payer: Self-pay

## 2021-02-24 VITALS — BP 138/70 | HR 75 | Ht 72.0 in | Wt 210.0 lb

## 2021-02-24 DIAGNOSIS — I5032 Chronic diastolic (congestive) heart failure: Secondary | ICD-10-CM | POA: Diagnosis not present

## 2021-02-24 DIAGNOSIS — I480 Paroxysmal atrial fibrillation: Secondary | ICD-10-CM

## 2021-02-24 DIAGNOSIS — I2510 Atherosclerotic heart disease of native coronary artery without angina pectoris: Secondary | ICD-10-CM | POA: Diagnosis not present

## 2021-02-24 DIAGNOSIS — E1140 Type 2 diabetes mellitus with diabetic neuropathy, unspecified: Secondary | ICD-10-CM

## 2021-02-24 DIAGNOSIS — I495 Sick sinus syndrome: Secondary | ICD-10-CM | POA: Diagnosis not present

## 2021-02-24 NOTE — Patient Instructions (Signed)
Medication Instructions:  Your physician recommends that you continue on your current medications as directed. Please refer to the Current Medication list given to you today.  *If you need a refill on your cardiac medications before your next appointment, please call your pharmacy*   Lab Work: None. If you have labs (blood work) drawn today and your tests are completely normal, you will receive your results only by: . MyChart Message (if you have MyChart) OR . A paper copy in the mail If you have any lab test that is abnormal or we need to change your treatment, we will call you to review the results.   Testing/Procedures: Your physician has requested that you have an echocardiogram. Echocardiography is a painless test that uses sound waves to create images of your heart. It provides your doctor with information about the size and shape of your heart and how well your heart's chambers and valves are working. This procedure takes approximately one hour. There are no restrictions for this procedure.     Follow-Up: At [facility], you and your health needs are our priority.  As part of our continuing mission to provide you with exceptional heart care, we have created designated Provider Care Teams.  These Care Teams include your primary Cardiologist (physician) and Advanced Practice Providers (APPs -  Physician Assistants and Nurse Practitioners) who all work together to provide you with the care you need, when you need it.  We recommend signing up for the patient portal called "MyChart".  Sign up information is provided on this After Visit Summary.  MyChart is used to connect with patients for Virtual Visits (Telemedicine).  Patients are able to view lab/test results, encounter notes, upcoming appointments, etc.  Non-urgent messages can be sent to your provider as well.   To learn more about what you can do with MyChart, go to [email or web address].    Your next appointment:   6  month(s)  The format for your next appointment:   In Person  Provider:   Robert Krasowski, MD   Other Instructions   Echocardiogram An echocardiogram is a test that uses sound waves (ultrasound) to produce images of the heart. Images from an echocardiogram can provide important information about:  Heart size and shape.  The size and thickness and movement of your heart's walls.  Heart muscle function and strength.  Heart valve function or if you have stenosis. Stenosis is when the heart valves are too narrow.  If blood is flowing backward through the heart valves (regurgitation).  A tumor or infectious growth around the heart valves.  Areas of heart muscle that are not working well because of poor blood flow or injury from a heart attack.  Aneurysm detection. An aneurysm is a weak or damaged part of an artery wall. The wall bulges out from the normal force of blood pumping through the body. Tell a health care provider about:  Any allergies you have.  All medicines you are taking, including vitamins, herbs, eye drops, creams, and over-the-counter medicines.  Any blood disorders you have.  Any surgeries you have had.  Any medical conditions you have.  Whether you are pregnant or may be pregnant. What are the risks? Generally, this is a safe test. However, problems may occur, including an allergic reaction to dye (contrast) that may be used during the test. What happens before the test? No specific preparation is needed. You may eat and drink normally. What happens during the test?  You will take off your   clothes from the waist up and put on a hospital gown.  Electrodes or electrocardiogram (ECG)patches may be placed on your chest. The electrodes or patches are then connected to a device that monitors your heart rate and rhythm.  You will lie down on a table for an ultrasound exam. A gel will be applied to your chest to help sound waves pass through your skin.  A  handheld device, called a transducer, will be pressed against your chest and moved over your heart. The transducer produces sound waves that travel to your heart and bounce back (or "echo" back) to the transducer. These sound waves will be captured in real-time and changed into images of your heart that can be viewed on a video monitor. The images will be recorded on a computer and reviewed by your health care provider.  You may be asked to change positions or hold your breath for a short time. This makes it easier to get different views or better views of your heart.  In some cases, you may receive contrast through an IV in one of your veins. This can improve the quality of the pictures from your heart. The procedure may vary among health care providers and hospitals.   What can I expect after the test? You may return to your normal, everyday life, including diet, activities, and medicines, unless your health care provider tells you not to do that. Follow these instructions at home:  It is up to you to get the results of your test. Ask your health care provider, or the department that is doing the test, when your results will be ready.  Keep all follow-up visits. This is important. Summary  An echocardiogram is a test that uses sound waves (ultrasound) to produce images of the heart.  Images from an echocardiogram can provide important information about the size and shape of your heart, heart muscle function, heart valve function, and other possible heart problems.  You do not need to do anything to prepare before this test. You may eat and drink normally.  After the echocardiogram is completed, you may return to your normal, everyday life, unless your health care provider tells you not to do that. This information is not intended to replace advice given to you by your health care provider. Make sure you discuss any questions you have with your health care provider. Document Revised:  08/02/2020 Document Reviewed: 08/02/2020 Elsevier Patient Education  2021 [employer]   

## 2021-02-24 NOTE — Progress Notes (Signed)
Cardiology Office Note:    Date:  02/24/2021   ID:  Lawrence [name], DOB 1946/02/14, MRN [id number]  PCP:  [name], MD  Cardiologist:  [name], MD    Referring MD: [name], MD   Chief Complaint  Patient presents with  . Follow-up  I am doing fine  History of Present Illness:    Lawrence [name] is a 75 y.o. male with past medical history significant for coronary artery disease, status post coronary artery bypass graft years ago, pacemaker present, cardiomyopathy with normalization, diabetes, essential hypertension, paroxysmal atrial fibrillation.  He comes today 2 months for follow-up.  Overall he is doing well.  He denies have any chest pain tightness squeezing pressure burning chest he is very upset about situation [place].  Denies have any cardiac complaints.  Past Medical History:  Diagnosis Date  . Acute on chronic diastolic CHF (congestive heart failure) ([place]) 01/03/2018  . Age-related cataract of both eyes 05/08/2018  . Anemia   . Anemia, iron deficiency 12/28/2014  . Arteriosclerosis of coronary artery 05/26/2019   Formatting of this note might be different from the original. Apr 12, 2017 Entered By: West Pugh Comment: April, 2018 echocardiogram = normal  . Arthritis    knees  . Benign neoplasm of colon 02/15/2021   Dec 20, 1999 Entered By: Everitt Amber Comment: 3/00 colonoscopy [facility]: tubular adenomaMay 01, 2003 Entered By: Everitt Amber Comment: 10/01 colonoscopy: HYPERPLASTIC POLYP  . Bradycardia with 31-40 beats per minute 08/21/2017  . CAP (community acquired pneumonia) 01/02/2018  . Cataracts, bilateral    removed   . Cervical spondylosis 05/08/2018  . CHF (congestive heart failure), NYHA class II, chronic, diastolic ([place]) 4/96/7591  . Chronic airway obstruction, not elsewhere classified    patient denies this dx on 12/04/19 - (Dr Melvyn Novas dx 02/2013)  . Chronic ischemic heart disease, unspecified   . Chronic obstructive  pulmonary disease ([place]) 01/22/2013   Formatting of this note might be different from the original. Overview:  Followed in Pulmonary clinic/ [facility]/ [place]  - PFTs 03/10/2013 FEV1  2.61 (86%) ratio 76 and no better p B2 with DLCO 66 corrects to 86%  Last Assessment & Plan:  - PFTs 03/10/2013 FEV1  2.61 (86%) ratio 76 and no better p B2 with DLCO 66 corrects to 86%  He does not have significant copd and never will.  I reviewed   . CKD (chronic kidney disease) stage 3, GFR 30-59 ml/min (HCC) 07/26/2018  . Coronary artery disease   . Coronary artery disease involving native coronary artery of native heart without angina pectoris 01/09/2016  . Diabetes mellitus    type II, neuropathy,   . 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 Diverticulosis of colon 02/15/2021  . Drug therapy 09/19/2020  . Drug-induced acute renal failure ([place]) 02/15/2021  . Dyslipidemia 01/09/2016  . Dystrophia unguium 02/15/2021  . ED (erectile dysfunction) of organic origin 07/05/2014  . Enthesopathy of ankle and tarsus 02/15/2021  . Erectile dysfunction due to arterial insufficiency 07/20/2015  . Esophageal reflux 05/08/2018  . Essential hypertension 05/08/2018  . Essential hypertension, benign   . GI bleed 07/21/2018  . Gout 05/08/2018  . Hearing loss    wears hearing aids  . Heart failure ([place]) 05/08/2018  . Hyperlipidemia, mixed   . Mixed hyperlipidemia 05/08/2018  . Neck pain 02/15/2021  . Nodule of kidney    incidentally found on CT abdomen 11/2012.  Pending Urology evaluation.   Marland Kitchen  Nontoxic multinodular goiter   . Nontoxic uninodular goiter 02/15/2021  . Obesity   . Obstructive sleep apnea (adult) (pediatric) 01/09/2016  . Other testicular hypofunction   . Pacemaker 09/13/2017   St Jude Device   . Paroxysmal atrial fibrillation ([place]) 11/26/2016  . Peripheral arterial occlusive disease ([place]) 02/15/2021  . Peripheral vascular disease ([place]) 01/09/2016   Formatting of this note might be different from the original. Right SFA  by segmental pressure, conservative management  . Peripheral vascular disease, unspecified ([place]) 01/09/2016   Overview:  Right SFA by segmental pressure, conservative management  . Permanent atrial fibrillation ([place])   . Peyronie's disease 07/20/2015  . Pneumonia    x 1  . Presence of permanent cardiac pacemaker    ST Jude PPM  . 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 PTSD (post-traumatic stress disorder) 05/08/2018  . PUD (peptic ulcer disease)   . Pulmonary hypertension ([place]) 01/09/2016  . Renal cyst, right 07/05/2014  . Renal insufficiency, mild    hx of  . Renal lesion 05/25/2014  . Secondary polycythemia 02/15/2021  . Sensorineural hearing loss (SNHL) of both ears 05/08/2018  . Sleep apnea    Does not use CPAP in last 3 months per patient 12/04/19  . Small bowel ischemia w pneumatosis s/p ileocectomy 07/21/2018   . Stage 2 chronic kidney disease 05/08/2018  . Status post coronary artery bypass graft 01/09/2016  . Tear of medial cartilage or meniscus of knee, current 02/15/2021   Jan 13, 2002 Entered By: Everitt Amber Comment: Left side. Probable. Patient unable to have MRI.  Marland Kitchen Testicular hypofunction 05/08/2018  . Tinnitus of both ears 05/08/2018  . Type 2 diabetes mellitus with diabetic neuropathy ([place]) 05/08/2018    Past Surgical History:  Procedure Laterality Date  . ATRIAL FIBRILLATION ABLATION N/A 02/05/2019   Procedure: ATRIAL FIBRILLATION ABLATION;  Surgeon: Constance Haw, MD;  Location: [facility];  Service: Cardiovascular;  Laterality: N/A;  . CHOLECYSTECTOMY    . COLONOSCOPY  05/11/2015   Colonic polyps statuts post polypectomy. Moderate predominalty sigmoid diverticulosis. Internal hemorrhoids. Tubular adenoma.   . CORONARY ARTERY BYPASS GRAFT  2007   x 4  . ESOPHAGOGASTRODUODENOSCOPY     10/06/2012:  hiatal hernia, moderate gastritis.  2012: Colonoscopy at [facility]:  one polyp.  Due next 2017  . EYE SURGERY     left eye metal removed   .  EYE SURGERY     bilateral cataracts removed  . INCISIONAL HERNIA REPAIR N/A 12/08/2019   Procedure: OPEN INCISIONAL HERNIA REPAIR WITH MESH;  Surgeon: Erroll Luna, MD;  Location: [facility];  Service: General;  Laterality: N/A;  . INGUINAL HERNIA REPAIR Left 12/08/2019   Procedure: HERNIA REPAIR LEFT  INGUINAL ADULT WITH MESH;  Surgeon: Erroll Luna, MD;  Location: [facility];  Service: General;  Laterality: Left;  . KNEE DEBRIDEMENT Right   . LAPAROTOMY N/A 07/21/2018   Procedure: EXPLORATORY LAPAROTOMY WITH SMALL BOWEL RESECTION;  Surgeon: Erroll Luna, MD;  Location: [facility];  Service: General;  Laterality: N/A;  . PACEMAKER IMPLANT N/A 08/22/2017   Procedure: Pacemaker Implant; St. Jude PPM  Surgeon: Constance Haw, MD;  Location: [facility];  Service: Cardiovascular;  Laterality: N/A;  . SHOULDER SURGERY     left shoulder  . TONSILECTOMY, ADENOIDECTOMY, BILATERAL MYRINGOTOMY AND TUBES    . WISDOM TOOTH EXTRACTION      Current Medications: Current Meds  Medication Sig  . allopurinol (ZYLOPRIM) 100 MG tablet Take  100 mg by mouth daily.  Marland Kitchen apixaban (ELIQUIS) 5 MG TABS tablet Take 5 mg by mouth 2 (two) times daily.  . Capsaicin 0.1 % CREA Apply 1 application topically at bedtime.   . Cholecalciferol (VITAMIN D3) 2000 units TABS Take 1,000 Units by mouth daily.   . cholestyramine (QUESTRAN) 4 g packet Take 4 g by mouth daily as needed (diarrhea).   . colchicine 0.6 MG tablet Take 0.6 mg by mouth as needed (Gout).  . furosemide (LASIX) 40 MG tablet Take 40 mg by mouth daily.   . Ipratropium-Albuterol (COMBIVENT) 20-100 MCG/ACT AERS respimat Inhale 1 puff into the lungs daily as needed for wheezing or shortness of breath.   . lidocaine (LIDODERM) 5 % Place 1-3 patches onto the skin daily. Remove & Discard patch within 12 hours or as directed by MD  . metoprolol tartrate (LOPRESSOR) 100 MG tablet TAKE 1 TABLET TWICE A DAY ( DOSE INCREASE PER DR CAMNITZ )  . Multiple  Vitamins-Minerals (EMERGEN-C VITAMIN C) PACK Take by mouth.  . nitroGLYCERIN (NITROSTAT) 0.4 MG SL tablet Place 0.4 mg under the tongue every 5 (five) minutes as needed for chest pain.   . Oxcarbazepine (TRILEPTAL) 300 MG tablet Take 150-300 mg by mouth 2 (two) times daily. Take one tab in the morning, one tab at noon  and two tabs at bedtime  for neuropathic pain  . OZEMPIC, 0.25 OR 0.5 MG/DOSE, 2 MG/1.5ML SOPN Inject 0.25 mg into the skin once a week.  . pantoprazole (PROTONIX) 40 MG tablet Take 40 mg by mouth daily before breakfast.   . simvastatin (ZOCOR) 20 MG tablet Take 20 mg by mouth at bedtime.      Allergies:   Ciprofloxacin, Sulfamethoxazole-trimethoprim, Lisinopril, Naproxen, and Omeprazole   Social History   Socioeconomic History  . Marital status: Married    Spouse name: Not on file  . Number of children: 5  . Years of education: Not on file  . Highest education level: Not on file  Occupational History  . Occupation: Retired    Comment: Medical illustrator; retired Chiropodist  Tobacco Use  . Smoking status: Former Smoker    Packs/day: 1.50    Years: 30.00    Pack years: 45.00    Types: Cigarettes    Quit date: 2006    Years since quitting: 16.1  . Smokeless tobacco: Former Systems developer    Types: Secondary school teacher  . Vaping Use: Never used  Substance and Sexual Activity  . Alcohol use: Yes    Comment: rare  . Drug use: No  . Sexual activity: Not on file  Other Topics Concern  . Not on file  Social History Narrative   ** Merged History Encounter **       Lives with wife in a one story home.  Has 6 children.  Retired Nature conservation officer.  Education: college.   Social Determinants of Health   Financial Resource Strain: Not on file  Food Insecurity: Not on file  Transportation Needs: Not on file  Physical Activity: Not on file  Stress: Not on file  Social Connections: Not on file     Family History: The patient's family history includes Asthma in his sister; Cancer in  his father and mother; Colon cancer in his mother; Emphysema in an other family member; Esophageal cancer in his father; Hypertension in his mother; Kidney cancer in his mother; Stroke in his father. ROS:   Please see the history of present illness.  All 14 point review of systems negative except as described per history of present illness  EKGs/Labs/Other Studies Reviewed:      Recent Labs: No results found for requested labs within last 8760 hours.  Recent Lipid Panel No results found for: CHOL, TRIG, HDL, CHOLHDL, VLDL, LDLCALC, LDLDIRECT  Physical Exam:    VS:  BP 138/70 (BP Location: Right Arm)   Pulse 75   Ht 6' (1.829 m)   Wt 210 lb (95.3 kg)   SpO2 94%   BMI 28.48 kg/m     Wt Readings from Last 3 Encounters:  02/24/21 210 lb (95.3 kg)  12/02/20 192 lb (87.1 kg)  09/26/20 209 lb 6.4 oz (95 kg)     GEN:  Well nourished, well developed in no acute distress HEENT: Normal NECK: No JVD; No carotid bruits LYMPHATICS: No lymphadenopathy CARDIAC: RRR, no murmurs, no rubs, no gallops RESPIRATORY:  Clear to auscultation without rales, wheezing or rhonchi  ABDOMEN: Soft, non-tender, non-distended MUSCULOSKELETAL:  No edema; No deformity  SKIN: Warm and dry LOWER EXTREMITIES: no swelling NEUROLOGIC:  Alert and oriented x 3 PSYCHIATRIC:  Normal affect   ASSESSMENT:    1. CHF (congestive heart failure), NYHA class II, chronic, diastolic ([place])   2. Coronary artery disease involving native coronary artery of native heart without angina pectoris   3. Paroxysmal atrial fibrillation (HCC)   4. Type 2 diabetes mellitus with diabetic neuropathy, without long-term current use of insulin (HCC)    PLAN:    In order of problems listed above:  1. Coronary artery disease stable from that point review asymptomatic on appropriate medications which I will continue.  I will ask him to have echocardiogram done to recheck left ventricle ejection fraction. 2. Coronary disease stable  from that point review on appropriate medications which I will continue.  I wanted him to have fasting lipid profile done today however in few days he does have appoint with him primary care physician and he prefers his primary care physician to do the test. 3. Paroxysmal atrial fibrillation the burden of atrial fibrillation 16%.  He is asymptomatic anticoagulated which I will continue.  I do not think we reached the point that antiarrhythmic therapy need to be initiated.  We will simply completing more monitoring. 4. Type 2 diabetes followed by internal medicine team.  I do have his hemoglobin A1c from summer of last year which was 7.4.  He need to be slightly better control. 5. Dyslipidemia latest fasting lipid profile have is from a year ago but again he is scheduled to have another blood test done on 17th of this month by his primary care physician.  Will wait for the results of it. 6. We did talk in length about healthy lifestyle need to exercise and be active and a half of me [place] looks quite good overall. 7. Pacemaker present.  At this Abbot device.  We will continue monitoring.  Normal function I did review interrogation   Medication Adjustments/Labs and Tests Ordered: Current medicines are reviewed at length with the patient today.  Concerns regarding medicines are outlined above.  No orders of the defined types were placed in this encounter.  Medication changes: No orders of the defined types were placed in this encounter.   Signed, Park Liter, MD, [facility] 02/24/2021 8:28 AM    [place]

## 2021-02-27 LAB — CUP PACEART REMOTE DEVICE CHECK
Battery Remaining Longevity: 106 mo
Battery Remaining Percentage: 95.5 %
Battery Voltage: 2.99 V
Brady Statistic AP VP Percent: 24 %
Brady Statistic AP VS Percent: 69 %
Brady Statistic AS VP Percent: 1 %
Brady Statistic AS VS Percent: 6.5 %
Brady Statistic RA Percent Paced: 77 %
Brady Statistic RV Percent Paced: 23 %
Date Time Interrogation Session: 20220304020014
Implantable Lead Implant Date: 20180830
Implantable Lead Implant Date: 20180830
Implantable Lead Location: 753859
Implantable Lead Location: 753860
Implantable Pulse Generator Implant Date: 20180830
Lead Channel Impedance Value: 360 Ohm
Lead Channel Impedance Value: 410 Ohm
Lead Channel Pacing Threshold Amplitude: 0.625 V
Lead Channel Pacing Threshold Amplitude: 0.75 V
Lead Channel Pacing Threshold Pulse Width: 0.5 ms
Lead Channel Pacing Threshold Pulse Width: 0.5 ms
Lead Channel Sensing Intrinsic Amplitude: 1.5 mV
Lead Channel Sensing Intrinsic Amplitude: 8.5 mV
Lead Channel Setting Pacing Amplitude: 1.625
Lead Channel Setting Pacing Amplitude: 2.5 V
Lead Channel Setting Pacing Pulse Width: 0.5 ms
Lead Channel Setting Sensing Sensitivity: 2 mV
Pulse Gen Model: 2272
Pulse Gen Serial Number: 8939815

## 2021-03-07 NOTE — Progress Notes (Signed)
Remote pacemaker transmission.   

## 2021-03-09 DIAGNOSIS — Z0000 Encounter for general adult medical examination without abnormal findings: Secondary | ICD-10-CM | POA: Diagnosis not present

## 2021-03-09 DIAGNOSIS — R0600 Dyspnea, unspecified: Secondary | ICD-10-CM | POA: Diagnosis not present

## 2021-03-09 DIAGNOSIS — E1142 Type 2 diabetes mellitus with diabetic polyneuropathy: Secondary | ICD-10-CM | POA: Diagnosis not present

## 2021-03-09 DIAGNOSIS — Z1331 Encounter for screening for depression: Secondary | ICD-10-CM | POA: Diagnosis not present

## 2021-03-09 DIAGNOSIS — I10 Essential (primary) hypertension: Secondary | ICD-10-CM | POA: Diagnosis not present

## 2021-03-09 DIAGNOSIS — Z6833 Body mass index (BMI) 33.0-33.9, adult: Secondary | ICD-10-CM | POA: Diagnosis not present

## 2021-03-15 ENCOUNTER — Other Ambulatory Visit: Payer: Self-pay | Admitting: Hematology and Oncology

## 2021-03-15 ENCOUNTER — Telehealth: Payer: Self-pay

## 2021-03-15 ENCOUNTER — Other Ambulatory Visit: Payer: Self-pay | Admitting: Gastroenterology

## 2021-03-15 DIAGNOSIS — R945 Abnormal results of liver function studies: Secondary | ICD-10-CM

## 2021-03-15 DIAGNOSIS — D500 Iron deficiency anemia secondary to blood loss (chronic): Secondary | ICD-10-CM

## 2021-03-15 DIAGNOSIS — R7989 Other specified abnormal findings of blood chemistry: Secondary | ICD-10-CM

## 2021-03-15 NOTE — Telephone Encounter (Signed)
I spoke with pt's wife,Phyllis, to get more information, She reports that Dr Hinton Rao told her to call if Lawrence [name] started having problems again. She states he is easily fatigued, falls asleep @ the drop of a hat, if light headed & dizzy at times. No falls.  Appt given for 03/24/2021.

## 2021-03-17 DIAGNOSIS — M8949 Other hypertrophic osteoarthropathy, multiple sites: Secondary | ICD-10-CM | POA: Diagnosis not present

## 2021-03-17 DIAGNOSIS — M1A39X0 Chronic gout due to renal impairment, multiple sites, without tophus (tophi): Secondary | ICD-10-CM | POA: Diagnosis not present

## 2021-03-17 DIAGNOSIS — Z79899 Other long term (current) drug therapy: Secondary | ICD-10-CM | POA: Diagnosis not present

## 2021-03-23 NOTE — Progress Notes (Signed)
[facility]  [address] [place],  [place]  [address] [phone]  Clinic Day:  03/24/2021  Referring physician: [name], MD  This document serves as a record of services personally performed by Hosie Poisson, MD. It was created on their behalf by [facility], a trained medical scribe. The creation of this record is based on the scribe's personal observations and the provider's statements to them.  CHIEF COMPLAINT:  CC: Longstanding iron deficiency  Current Treatment:  Surveillance   HISTORY OF PRESENT ILLNESS:  Lawrence [name] is a 75 y.o. male with longstanding iron deficiency, who has required repeated iron infusions, averaging about every 6 months.  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  He required multiple frequent IV iron infusions for several years.  We began seeing him in July 2016 and he had just received IV Feraheme.  In July 2019, he was hospitalized with small bowel ischemia.  He underwent surgical resection with ileocecectomy on July 29th.  In retrospect, I feel he was likely losing blood from the ischemic bowel for some time.  He had severe diarrhea after his surgery, but recovered well.  He received IV Feraheme again in August of 2019, but has not required any further infusions after treatment of his bowel ischemia.  He had been in atrial fibrillation since mid October 2019, and had a cardiac ablation last year.  He has multiple medical comorbidities including the heart failure, chronic kidney disease, gout, atrial fibrillation, for which he has undergone cardioversion and is on apixaban.  He underwent abdominal hernia repair x2 in December with Dr. Brantley Stage.  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  His hemoglobin has mildly decreased from 12.5 to 11.8 in April 2021.    INTERVAL  HISTORY:  Lawrence [name] is here for routine follow up and states that he was doing well, but within the last 1-2 months started getting fatigued.  We have not seen him in the last year.  He started taking iron supplement a few days ago with initial improvement, but he is starting to get fatigued again.  He also notes worsening shortness of breath with exertion.  He denies any evidence of melena, hematuria, epistaxis or overt bleeding.  I will obtain Hemoccult studies for completeness.  He also denies any recent surgeries.  He is scheduled for ECHO on April 4th with Dr. [name].  He notes left knee pain which he rates as a 4/10 when sitting and exacerbated with ambulation.  He has been switched from glipizide to Ozempic.  He has also been placed on colchicine and allopurinol for gout.  His hemoglobin has decreased from 11.8 to 10.0 with an MCV of 95, and this is the same results that Center had recently.  The patient took two days of oral iron but does not generally tolerate this well.  His white count and platelets are normal.  Chemistries are unremarkable except for a BUN of 44, and a creatinine of 1.7.  His  appetite is poor, and he has gained 6 and 1/2 pounds since his last visit.  He eats about 1 meal per day.  He denies fever, chills or other signs of infection.  He denies nausea, vomiting, bowel issues, or abdominal pain.  He denies sore throat, cough, dyspnea, or chest pain.  REVIEW OF SYSTEMS:  Review of Systems  Constitutional: Positive for fatigue. Negative for appetite change, chills, fever and unexpected weight change.  HENT:  Negative.   Eyes: Negative.   Respiratory: Negative.  Negative for chest tightness, cough, hemoptysis, shortness of breath and wheezing.   Cardiovascular: Positive for leg swelling. Negative for chest pain and palpitations.  Gastrointestinal: Negative.  Negative for abdominal distention, abdominal pain, blood in stool, constipation, diarrhea, nausea and vomiting.   Endocrine: Negative.   Genitourinary: Negative.  Negative for difficulty urinating, dysuria, frequency and hematuria.   Musculoskeletal: Positive for arthralgias (left knee). Negative for back pain, flank pain, gait problem and myalgias.  Skin: Negative.   Neurological: Negative.  Negative for dizziness, extremity weakness, gait problem, headaches, light-headedness, numbness, seizures and speech difficulty.  Hematological: Negative.   Psychiatric/Behavioral: Negative.  Negative for depression and sleep disturbance. The patient is not nervous/anxious.   All other systems reviewed and are negative.    VITALS:  Blood pressure 118/71, pulse 79, temperature 97.9 F (36.6 C), temperature source Oral, resp. rate 18, height 6' (1.829 m), weight 216 lb 6.4 oz (98.2 kg), SpO2 96 %.  Wt Readings from Last 3 Encounters:  03/24/21 216 lb 6.4 oz (98.2 kg)  02/24/21 210 lb (95.3 kg)  12/02/20 192 lb (87.1 kg)    Body mass index is 29.35 kg/m.  Performance status (ECOG): 1 - Symptomatic but completely ambulatory  PHYSICAL EXAM:  Physical Exam Constitutional:      General: He is not in acute distress.    Appearance: Normal appearance. He is normal weight.  HENT:     Head: Normocephalic and atraumatic.  Eyes:     General: No scleral icterus.    Extraocular Movements: Extraocular movements intact.     Conjunctiva/sclera: Conjunctivae normal.     Pupils: Pupils are equal, round, and reactive to light.  Cardiovascular:     Rate and Rhythm: Normal rate and regular rhythm.     Pulses: Normal pulses.     Heart sounds: Normal heart sounds. No murmur heard. No friction rub. No gallop.   Pulmonary:     Effort: Pulmonary effort is normal. No respiratory distress.     Breath sounds: Normal breath sounds.  Abdominal:     General: Bowel sounds are normal. There is no distension.     Palpations: Abdomen is soft. There is no hepatomegaly, splenomegaly or mass.     Tenderness: There is no abdominal  tenderness.     Comments: Firmness in the mid abdomen at the surgical site.  Musculoskeletal:        General: Normal range of motion.     Cervical back: Normal range of motion and neck supple.     Right lower leg: No edema.     Left lower leg: No edema.  Lymphadenopathy:     Cervical: No cervical adenopathy.  Skin:    General: Skin is warm and dry.  Neurological:     General: No focal deficit present.     Mental Status: He is alert and oriented to person, place, and time. Mental status is at baseline.  Psychiatric:        Mood and Affect: Mood normal.        Behavior: Behavior normal.        Thought Content: Thought content normal.        Judgment: Judgment normal.     LABS:   CBC Latest Ref Rng & Units 03/24/2021 12/04/2019 01/27/2019  WBC - 9.0 8.8 12.3(H)  Hemoglobin 13.5 - 17.5  10.0(A) 11.5(L) 11.7(L)  Hematocrit 41 - 53 31(A) 36.0(L) 35.2(L)  Platelets 150 - 399 168 208 349   CMP Latest Ref Rng & Units 03/24/2021 12/08/2020 12/04/2019  Glucose 70 - 99 mg/dL - - 169(H)  BUN 4 - 21 44(A) - 24(H)  Creatinine 0.6 - 1.3 1.7(A) - 1.34(H)  Sodium 137 - 147 140 - 139  Potassium 3.4 - 5.3 4.5 - 4.1  Chloride 99 - 108 104 - 107  CO2 13 - 22 25(A) - 22  Calcium 8.7 - 10.7 8.8 - 8.8(L)  Total Protein 6.5 - 8.1 g/dL - - 7.0  Total Bilirubin 0.3 - 1.2 mg/dL - - 0.9  Alkaline Phos 25 - 125 241(A) 339(H) 198(H)  AST 14 - 40 35 - 24  ALT 10 - 40 21 - 26      Lab Results  Component Value Date   TIBC 380 03/29/2015   TIBC 348 12/28/2014   TIBC 355 05/25/2014   FERRITIN 32 03/29/2015   FERRITIN 61 12/28/2014   FERRITIN 70 05/25/2014   IRONPCTSAT 13 (L) 03/29/2015   IRONPCTSAT 15 (L) 12/28/2014   IRONPCTSAT 18 (L) 05/25/2014   No results found for: LDH   STUDIES:  CUP PACEART REMOTE DEVICE CHECK  Result Date: 02/27/2021 Scheduled remote reviewed. Normal device function.  AF burden 15%; V rates controlled. History of AF and on apixaban and metoprolol. Next remote 91 days.  HB    Allergies:  Allergies  Allergen Reactions  . Ciprofloxacin Other (See Comments)    Atrial fib,  renal failure  . Sulfamethoxazole-Trimethoprim Other (See Comments)    Atrial fib,  renal failure, potassium level spiked   . Lisinopril Nausea And Vomiting and Other (See Comments)    GI Upset  . Naproxen Nausea And Vomiting and Other (See Comments)    GI Upset  . Omeprazole     Current Medications: Current Outpatient Medications  Medication Sig Dispense Refill  . allopurinol (ZYLOPRIM) 100 MG tablet Take 100 mg by mouth daily.    Marland Kitchen apixaban (ELIQUIS) 5 MG TABS tablet Take 5 mg by mouth 2 (two) times daily.    . Capsaicin 0.1 % CREA Apply 1 application topically at bedtime.     . cholecalciferol (VITAMIN D) 25 MCG (1000 UNIT) tablet Take 1,000 Units by mouth daily.     . cholestyramine (QUESTRAN) 4 g packet Take 4 g by mouth 4 (four) times daily as needed (diarrhea).    . colchicine 0.6 MG tablet Take 0.6 mg by mouth as needed (Gout). Up to 3 tablet as needed    . furosemide (LASIX) 40 MG tablet Take 40 mg by mouth daily.     . Ipratropium-Albuterol (COMBIVENT) 20-100 MCG/ACT AERS respimat Inhale 1 puff into the lungs daily as needed for wheezing or shortness of breath.     Marland Kitchen ipratropium-albuterol (DUONEB) 0.5-2.5 (3) MG/3ML SOLN Take 3 mLs by nebulization.    . lidocaine (LIDODERM) 5 % Place 1-3 patches onto the skin daily. Remove & Discard patch within 12 hours or as directed by MD    . metolazone (ZAROXOLYN) 2.5 MG tablet Take 2.5 mg by mouth daily. 20 minutes before Furosemide    . metoprolol tartrate (LOPRESSOR) 100 MG tablet TAKE 1 TABLET TWICE A DAY ( DOSE INCREASE PER DR CAMNITZ ) 180 tablet 2  . nitroGLYCERIN (NITROSTAT) 0.4 MG SL tablet Place 0.4 mg under the tongue every 5 (five) minutes as needed for chest pain.     Marland Kitchen  Oxcarbazepine (TRILEPTAL) 300 MG tablet Take 150-300 mg by mouth as directed. Take one tab in the morning, one tab at noon  and two tabs at bedtime  for  neuropathic pain    . OZEMPIC, 0.25 OR 0.5 MG/DOSE, 2 MG/1.5ML SOPN Inject 0.25 mg into the skin once a week.    . pantoprazole (PROTONIX) 40 MG tablet Take 40 mg by mouth daily before breakfast.     . simvastatin (ZOCOR) 20 MG tablet Take 20 mg by mouth at bedtime.      No current facility-administered medications for this visit.     ASSESSMENT & PLAN:   Assessment:   1.  Chronic iron deficiency in a patient with many comorbidities who required IV iron on a regular basis for over 2 years.  Apparently a lot of his chronic blood loss was from the ischemic bowel which has been resected in 2019.  His hemoglobin has decreased again, and we have obtained iron levels today.  For now he will continue oral iron supplement, and if required, we will arrange for IV iron.  2.  Skin cancer of the left nasolabial fold, apparently squamous cell type.    3.  Mildly elevated liver transaminases, resolved.      4. Chronic kidney disease, mildly worse.  5. Congestive heart failure, stable.  6. Probable recurrent GI blood loss, we will check stool Hemoccults and refer him to Dr. Lyndel Safe.    Plan: His hemoglobin has dropped and he is symptomatic with fatigue and shortness of breath.  We have obtained iron studies, and if these are consistent with deficiency we will arrange for IV iron.  For now he can continue oral supplement as tolerated.  I will obtain Hemoccult studies for completeness, and if he is found to be iron deficient, we can refer him back to Dr. Lyndel Safe.  We will see him back in 1 month with CBC, CMP, iron, TIBC and ferritin level.  They understand and agree with this plan of care.  He knows to call if he needs to be seen sooner.     I provided 20 minutes of face-to-face time during this this encounter and > 50% was spent counseling as documented under my assessment and plan.    Derwood Kaplan, MD [facility] AT [facility] [place] [address] Dept: [phone] Dept Fax: [phone]   I, Rita Ohara, am acting as scribe for Derwood Kaplan, MD  I have reviewed this report as typed by the medical scribe, and it is complete and accurate.  Hermina Barters

## 2021-03-24 ENCOUNTER — Inpatient Hospital Stay: Payer: Medicare Other | Attending: Oncology | Admitting: Oncology

## 2021-03-24 ENCOUNTER — Other Ambulatory Visit: Payer: Self-pay | Admitting: Pharmacist

## 2021-03-24 ENCOUNTER — Telehealth: Payer: Self-pay

## 2021-03-24 ENCOUNTER — Other Ambulatory Visit: Payer: Self-pay | Admitting: Oncology

## 2021-03-24 ENCOUNTER — Encounter: Payer: Self-pay | Admitting: Oncology

## 2021-03-24 ENCOUNTER — Other Ambulatory Visit: Payer: Self-pay

## 2021-03-24 ENCOUNTER — Other Ambulatory Visit: Payer: Self-pay | Admitting: Hematology and Oncology

## 2021-03-24 ENCOUNTER — Inpatient Hospital Stay: Payer: Medicare Other

## 2021-03-24 ENCOUNTER — Telehealth: Payer: Self-pay | Admitting: Oncology

## 2021-03-24 VITALS — BP 118/71 | HR 79 | Temp 97.9°F | Resp 18 | Ht 72.0 in | Wt 216.4 lb

## 2021-03-24 DIAGNOSIS — I2510 Atherosclerotic heart disease of native coronary artery without angina pectoris: Secondary | ICD-10-CM | POA: Diagnosis not present

## 2021-03-24 DIAGNOSIS — D500 Iron deficiency anemia secondary to blood loss (chronic): Secondary | ICD-10-CM

## 2021-03-24 DIAGNOSIS — D509 Iron deficiency anemia, unspecified: Secondary | ICD-10-CM | POA: Diagnosis not present

## 2021-03-24 DIAGNOSIS — D649 Anemia, unspecified: Secondary | ICD-10-CM | POA: Diagnosis not present

## 2021-03-24 DIAGNOSIS — Z79899 Other long term (current) drug therapy: Secondary | ICD-10-CM | POA: Insufficient documentation

## 2021-03-24 LAB — IRON AND TIBC
Iron: 37 ug/dL — ABNORMAL LOW (ref 45–182)
Saturation Ratios: 10 % — ABNORMAL LOW (ref 17.9–39.5)
TIBC: 388 ug/dL (ref 250–450)
UIBC: 351 ug/dL

## 2021-03-24 LAB — BASIC METABOLIC PANEL
BUN: 44 — AB (ref 4–21)
CO2: 25 — AB (ref 13–22)
Chloride: 104 (ref 99–108)
Creatinine: 1.7 — AB (ref 0.6–1.3)
Glucose: 147
Potassium: 4.5 (ref 3.4–5.3)
Sodium: 140 (ref 137–147)

## 2021-03-24 LAB — COMPREHENSIVE METABOLIC PANEL
Albumin: 4.2 (ref 3.5–5.0)
Calcium: 8.8 (ref 8.7–10.7)

## 2021-03-24 LAB — CBC AND DIFFERENTIAL
HCT: 31 — AB (ref 41–53)
Hemoglobin: 10 — AB (ref 13.5–17.5)
Neutrophils Absolute: 6.84
Platelets: 168 (ref 150–399)
WBC: 9

## 2021-03-24 LAB — CBC
MCV: 95 — AB (ref 80–94)
RBC: 3.32 — AB (ref 3.87–5.11)

## 2021-03-24 LAB — HEPATIC FUNCTION PANEL
ALT: 21 (ref 10–40)
AST: 35 (ref 14–40)
Alkaline Phosphatase: 241 — AB (ref 25–125)
Bilirubin, Total: 0.4

## 2021-03-24 LAB — FERRITIN: Ferritin: 31 ng/mL (ref 24–336)

## 2021-03-24 NOTE — Telephone Encounter (Signed)
Faxed referral to Dr, Steve Rattler office

## 2021-03-24 NOTE — Telephone Encounter (Signed)
Per 4/1 los next appt scheduled and given to patient

## 2021-03-24 NOTE — Telephone Encounter (Signed)
-----   Message from Derwood Kaplan, MD sent at 03/24/2021 11:15 AM EDT ----- Regarding: referral Pls ref Dr. Lyndel Safe, last saw about 6 months ago but now hgb back down to 10, likely iron def but labs pending.  We are checking stool hemoccults

## 2021-03-24 NOTE — Telephone Encounter (Signed)
LVM on home and cell for patient to schedule an appointment with Dr Lyndel Safe regarding IDA

## 2021-03-25 DIAGNOSIS — D509 Iron deficiency anemia, unspecified: Secondary | ICD-10-CM | POA: Diagnosis not present

## 2021-03-25 DIAGNOSIS — Z79899 Other long term (current) drug therapy: Secondary | ICD-10-CM | POA: Diagnosis not present

## 2021-03-26 DIAGNOSIS — D509 Iron deficiency anemia, unspecified: Secondary | ICD-10-CM | POA: Diagnosis not present

## 2021-03-26 DIAGNOSIS — Z79899 Other long term (current) drug therapy: Secondary | ICD-10-CM | POA: Diagnosis not present

## 2021-03-27 ENCOUNTER — Other Ambulatory Visit: Payer: Self-pay | Admitting: Oncology

## 2021-03-27 ENCOUNTER — Telehealth: Payer: Self-pay | Admitting: Cardiology

## 2021-03-27 ENCOUNTER — Other Ambulatory Visit: Payer: Self-pay

## 2021-03-27 DIAGNOSIS — D500 Iron deficiency anemia secondary to blood loss (chronic): Secondary | ICD-10-CM

## 2021-03-27 LAB — OCCULT BLOOD X 1 CARD TO LAB, STOOL
Fecal Occult Bld: POSITIVE — AB
Fecal Occult Bld: POSITIVE — AB
Fecal Occult Bld: NEGATIVE

## 2021-03-27 NOTE — Telephone Encounter (Signed)
4-27 AT 130 and scheduled with patient's wife

## 2021-03-27 NOTE — Telephone Encounter (Signed)
Pt c/o medication issue:  1. Name of Medication: simvastatin (ZOCOR) 20 MG tablet  2. How are you currently taking this medication (dosage and times per day)? 1 tablet at bedtime  3. Are you having a reaction (difficulty breathing--STAT)? no  4. What is your medication issue? Patient's PCP, Dr. Lin Landsman wants to change the patient's cholesterol medication. She states they are coming in tomorrow and can get an update then.

## 2021-03-28 ENCOUNTER — Other Ambulatory Visit: Payer: Self-pay

## 2021-03-28 ENCOUNTER — Ambulatory Visit (INDEPENDENT_AMBULATORY_CARE_PROVIDER_SITE_OTHER): Payer: Medicare Other

## 2021-03-28 ENCOUNTER — Inpatient Hospital Stay: Payer: Medicare Other

## 2021-03-28 VITALS — BP 122/67 | HR 64 | Temp 97.5°F | Resp 20 | Ht 72.0 in | Wt 220.0 lb

## 2021-03-28 DIAGNOSIS — E1140 Type 2 diabetes mellitus with diabetic neuropathy, unspecified: Secondary | ICD-10-CM

## 2021-03-28 DIAGNOSIS — I5032 Chronic diastolic (congestive) heart failure: Secondary | ICD-10-CM | POA: Diagnosis not present

## 2021-03-28 DIAGNOSIS — Z79899 Other long term (current) drug therapy: Secondary | ICD-10-CM | POA: Diagnosis not present

## 2021-03-28 DIAGNOSIS — I2510 Atherosclerotic heart disease of native coronary artery without angina pectoris: Secondary | ICD-10-CM | POA: Diagnosis not present

## 2021-03-28 DIAGNOSIS — I480 Paroxysmal atrial fibrillation: Secondary | ICD-10-CM

## 2021-03-28 DIAGNOSIS — D508 Other iron deficiency anemias: Secondary | ICD-10-CM

## 2021-03-28 DIAGNOSIS — D509 Iron deficiency anemia, unspecified: Secondary | ICD-10-CM | POA: Diagnosis not present

## 2021-03-28 LAB — ECHOCARDIOGRAM COMPLETE
Area-P 1/2: 4.41 cm2
Calc EF: 49.8 %
S' Lateral: 3.7 cm
Single Plane A2C EF: 53.9 %
Single Plane A4C EF: 44 %

## 2021-03-28 MED ORDER — SODIUM CHLORIDE 0.9 % IV SOLN
Freq: Once | INTRAVENOUS | Status: AC
Start: 2021-03-28 — End: 2021-03-28
  Filled 2021-03-28: qty 250

## 2021-03-28 MED ORDER — PERFLUTREN LIPID MICROSPHERE
1.0000 mL | INTRAVENOUS | Status: AC | PRN
  Administered 2021-03-28: 4 mL via INTRAVENOUS

## 2021-03-28 MED ORDER — SODIUM CHLORIDE 0.9 % IV SOLN
510.0000 mg | Freq: Once | INTRAVENOUS | Status: AC
  Administered 2021-03-28: 510 mg via INTRAVENOUS
  Filled 2021-03-28: qty 17

## 2021-03-28 NOTE — Progress Notes (Signed)
Complete echocardiogram with contrast performed.  Jimmy Ardell Aaronson RDCS, RVT

## 2021-03-28 NOTE — Patient Instructions (Signed)
Ferumoxytol injection What is this medicine? FERUMOXYTOL is an iron complex. Iron is used to make healthy red blood cells, which carry oxygen and nutrients throughout the body. This medicine is used to treat iron deficiency anemia. This medicine may be used for other purposes; ask your health care provider or pharmacist if you have questions. COMMON BRAND NAME(S): Feraheme What should I tell my health care provider before I take this medicine? They need to know if you have any of these conditions:  anemia not caused by low iron levels  high levels of iron in the blood  magnetic resonance imaging (MRI) test scheduled  an unusual or allergic reaction to iron, other medicines, foods, dyes, or preservatives  pregnant or trying to get pregnant  breast-feeding How should I use this medicine? This medicine is for injection into a vein. It is given by a health care professional in a hospital or clinic setting. Talk to your pediatrician regarding the use of this medicine in children. Special care may be needed. Overdosage: If you think you have taken too much of this medicine contact a poison control center or emergency room at once. NOTE: This medicine is only for you. Do not share this medicine with others. What if I miss a dose? It is important not to miss your dose. Call your doctor or health care professional if you are unable to keep an appointment. What may interact with this medicine? This medicine may interact with the following medications:  other iron products This list may not describe all possible interactions. Give your health care provider a list of all the medicines, herbs, non-prescription drugs, or dietary supplements you use. Also tell them if you smoke, drink alcohol, or use illegal drugs. Some items may interact with your medicine. What should I watch for while using this medicine? Visit your doctor or healthcare professional regularly. Tell your doctor or healthcare  professional if your symptoms do not start to get better or if they get worse. You may need blood work done while you are taking this medicine. You may need to follow a special diet. Talk to your doctor. Foods that contain iron include: whole grains/cereals, dried fruits, beans, or peas, leafy green vegetables, and organ meats (liver, kidney). What side effects may I notice from receiving this medicine? Side effects that you should report to your doctor or health care professional as soon as possible:  allergic reactions like skin rash, itching or hives, swelling of the face, lips, or tongue  breathing problems  changes in blood pressure  feeling faint or lightheaded, falls  fever or chills  flushing, sweating, or hot feelings  swelling of the ankles or feet Side effects that usually do not require medical attention (report to your doctor or health care professional if they continue or are bothersome):  diarrhea  headache  nausea, vomiting  stomach pain This list may not describe all possible side effects. Call your doctor for medical advice about side effects. You may report side effects to FDA at 1-800-FDA-1088. Where should I keep my medicine? This drug is given in a hospital or clinic and will not be stored at home. NOTE: This sheet is a summary. It may not cover all possible information. If you have questions about this medicine, talk to your doctor, pharmacist, or health care provider.  2021 Elsevier/Gold Standard (2017-01-28 20:21:10)  

## 2021-03-28 NOTE — Progress Notes (Signed)
1429: PT STABLE AT TIME OF DISCHARGE

## 2021-03-29 ENCOUNTER — Telehealth: Payer: Self-pay | Admitting: Emergency Medicine

## 2021-03-29 MED ORDER — ATORVASTATIN CALCIUM 20 MG PO TABS: 20.0000 mg | ORAL_TABLET | Freq: Every day | ORAL | 3 refills | Status: DC

## 2021-03-29 NOTE — Telephone Encounter (Signed)
-----   Message from Park Liter, MD sent at 03/29/2021  1:00 PM EDT ----- Ejection fraction was that it was before 4045% before 3779, some diastolic dysfunction.  He need to have Chem-7 as well as proBNP done to decide about upgrading his therapy

## 2021-03-29 NOTE — Telephone Encounter (Signed)
Spoke to the patient/patients wife just now and let them know Dr. Wendy Poet recommendations. They verbalize understanding and thank me for calling them back.

## 2021-03-29 NOTE — Telephone Encounter (Signed)
So what is the question what medication we should put him on?.  I would like to switch him from simvastatin to Lipitor 20 mg daily.

## 2021-03-29 NOTE — Telephone Encounter (Signed)
Called patient wife per dpr. Informed her of result. She reports patient just had labs drawn. Results are in chart. His creatinine was 1.7. Patient also had low hemoglobin and got a iron infusion. Will check with Dr. [name] about more labs or med changes. Patient wife also wants to make sure we get records from Dr. Lin Landsman. Will check for this paperwork in [place] next week.

## 2021-03-30 NOTE — Telephone Encounter (Signed)
Left message for patient to return call.

## 2021-03-30 NOTE — Telephone Encounter (Signed)
I would not make any changes with medication right now.  The issue is kidney dysfunction

## 2021-03-31 NOTE — Telephone Encounter (Signed)
Called and spoke to patient patient wife per dpr. 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 She understood no further questions.

## 2021-04-03 ENCOUNTER — Telehealth: Payer: Self-pay

## 2021-04-03 NOTE — Telephone Encounter (Addendum)
Last office note, & below faxed to Dr Steve Rattler office.   ----- Message from Derwood Kaplan, MD sent at 04/02/2021 10:06 PM EDT ----- Regarding: referral 2 of 3 stools were hemoccult positive so he will need to see GI again, I think it was Dr. Lyndel Safe Pls let them know and make the referral

## 2021-04-04 ENCOUNTER — Inpatient Hospital Stay: Payer: Medicare Other

## 2021-04-04 ENCOUNTER — Other Ambulatory Visit: Payer: Self-pay

## 2021-04-04 VITALS — BP 140/66 | HR 60 | Temp 98.2°F | Resp 18 | Ht 72.0 in | Wt 210.1 lb

## 2021-04-04 DIAGNOSIS — D508 Other iron deficiency anemias: Secondary | ICD-10-CM

## 2021-04-04 DIAGNOSIS — D509 Iron deficiency anemia, unspecified: Secondary | ICD-10-CM | POA: Diagnosis not present

## 2021-04-04 DIAGNOSIS — Z79899 Other long term (current) drug therapy: Secondary | ICD-10-CM | POA: Diagnosis not present

## 2021-04-04 MED ORDER — SODIUM CHLORIDE 0.9 % IV SOLN
510.0000 mg | Freq: Once | INTRAVENOUS | Status: AC
  Administered 2021-04-04: 510 mg via INTRAVENOUS
  Filled 2021-04-04: qty 510

## 2021-04-04 MED ORDER — SODIUM CHLORIDE 0.9 % IV SOLN
Freq: Once | INTRAVENOUS | Status: AC
Start: 2021-04-04 — End: 2021-04-04
  Filled 2021-04-04: qty 250

## 2021-04-04 NOTE — Patient Instructions (Signed)
Ferumoxytol injection What is this medicine? FERUMOXYTOL is an iron complex. Iron is used to make healthy red blood cells, which carry oxygen and nutrients throughout the body. This medicine is used to treat iron deficiency anemia. This medicine may be used for other purposes; ask your health care provider or pharmacist if you have questions. COMMON BRAND NAME(S): Feraheme What should I tell my health care provider before I take this medicine? They need to know if you have any of these conditions:  anemia not caused by low iron levels  high levels of iron in the blood  magnetic resonance imaging (MRI) test scheduled  an unusual or allergic reaction to iron, other medicines, foods, dyes, or preservatives  pregnant or trying to get pregnant  breast-feeding How should I use this medicine? This medicine is for injection into a vein. It is given by a health care professional in a hospital or clinic setting. Talk to your pediatrician regarding the use of this medicine in children. Special care may be needed. Overdosage: If you think you have taken too much of this medicine contact a poison control center or emergency room at once. NOTE: This medicine is only for you. Do not share this medicine with others. What if I miss a dose? It is important not to miss your dose. Call your doctor or health care professional if you are unable to keep an appointment. What may interact with this medicine? This medicine may interact with the following medications:  other iron products This list may not describe all possible interactions. Give your health care provider a list of all the medicines, herbs, non-prescription drugs, or dietary supplements you use. Also tell them if you smoke, drink alcohol, or use illegal drugs. Some items may interact with your medicine. What should I watch for while using this medicine? Visit your doctor or healthcare professional regularly. Tell your doctor or healthcare  professional if your symptoms do not start to get better or if they get worse. You may need blood work done while you are taking this medicine. You may need to follow a special diet. Talk to your doctor. Foods that contain iron include: whole grains/cereals, dried fruits, beans, or peas, leafy green vegetables, and organ meats (liver, kidney). What side effects may I notice from receiving this medicine? Side effects that you should report to your doctor or health care professional as soon as possible:  allergic reactions like skin rash, itching or hives, swelling of the face, lips, or tongue  breathing problems  changes in blood pressure  feeling faint or lightheaded, falls  fever or chills  flushing, sweating, or hot feelings  swelling of the ankles or feet Side effects that usually do not require medical attention (report to your doctor or health care professional if they continue or are bothersome):  diarrhea  headache  nausea, vomiting  stomach pain This list may not describe all possible side effects. Call your doctor for medical advice about side effects. You may report side effects to FDA at 1-800-FDA-1088. Where should I keep my medicine? This drug is given in a hospital or clinic and will not be stored at home. NOTE: This sheet is a summary. It may not cover all possible information. If you have questions about this medicine, talk to your doctor, pharmacist, or health care provider.  2021 Elsevier/Gold Standard (2017-01-28 20:21:10)  

## 2021-04-18 ENCOUNTER — Ambulatory Visit: Payer: Medicare Other | Admitting: Gastroenterology

## 2021-04-19 ENCOUNTER — Ambulatory Visit (INDEPENDENT_AMBULATORY_CARE_PROVIDER_SITE_OTHER): Payer: Medicare Other | Admitting: Gastroenterology

## 2021-04-19 ENCOUNTER — Other Ambulatory Visit: Payer: Self-pay

## 2021-04-19 ENCOUNTER — Encounter: Payer: Self-pay | Admitting: Gastroenterology

## 2021-04-19 ENCOUNTER — Telehealth: Payer: Self-pay

## 2021-04-19 VITALS — BP 118/60 | HR 64 | Ht 72.0 in | Wt 206.0 lb

## 2021-04-19 DIAGNOSIS — Z8719 Personal history of other diseases of the digestive system: Secondary | ICD-10-CM | POA: Diagnosis not present

## 2021-04-19 DIAGNOSIS — K432 Incisional hernia without obstruction or gangrene: Secondary | ICD-10-CM | POA: Diagnosis not present

## 2021-04-19 DIAGNOSIS — Z800 Family history of malignant neoplasm of digestive organs: Secondary | ICD-10-CM | POA: Diagnosis not present

## 2021-04-19 DIAGNOSIS — K219 Gastro-esophageal reflux disease without esophagitis: Secondary | ICD-10-CM | POA: Diagnosis not present

## 2021-04-19 DIAGNOSIS — R195 Other fecal abnormalities: Secondary | ICD-10-CM

## 2021-04-19 DIAGNOSIS — K449 Diaphragmatic hernia without obstruction or gangrene: Secondary | ICD-10-CM | POA: Diagnosis not present

## 2021-04-19 NOTE — Patient Instructions (Signed)
If you are age 76 or older, your body mass index should be between 23-30. Your Body mass index is 27.94 kg/m. If this is out of the aforementioned range listed, please consider follow up with your Primary Care Provider.  If you are age 13 or younger, your body mass index should be between 19-25. Your Body mass index is 27.94 kg/m. If this is out of the aformentioned range listed, please consider follow up with your Primary Care Provider.   You have been scheduled for an endoscopy and colonoscopy. Please follow the written instructions given to you at your visit today. Please pick up your prep supplies at the pharmacy within the next 1-3 days. If you use inhalers (even only as needed), please bring them with you on the day of your procedure.  Continue Protonix  Thank you,  Dr. Jackquline Denmark

## 2021-04-19 NOTE — Telephone Encounter (Signed)
Patient is on Eliquis for history of paroxysmal atrial fibrillation.

## 2021-04-19 NOTE — H&P (View-Only) (Signed)
Chief Complaint:   Referring Provider:  [name], MD      ASSESSMENT AND PLAN;   #1. Heme + stools with IDA. Has associated anemia of chronic disease d/t CKD.  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 Fib/CHF on Eliquis. Neg EGD except for small [place] 09/2012 and neg colon except for colonic polyps and mod sig div 04/2015. VCE 12/23/2012 showed few small bowel AVMs, limited examination. Also with mild chronic renal insufficiency (Cr 1.9) negative CT scan  10/2019, 11/2012 except for indeterminate right renal lesion and ventral abdominal wall hernia  #2. 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   #2. 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 Neg celiac, ceruloplasmin, acute hepatitis panel, AMA. S/P vaccines for hep A/B. Nl AFP Neg CT 11/13/2019 except for cirrhosis with mild splenomegaly. No ascites. Neg [place] 12/2020 for any liver masses.  #3.  GERD with small HH.  #4.  FH of colon cancer (mother>26yr and FH of eso Ca (father)  #5.  Ventral incisional hernia.  Conservative management for now per Dr. CBrantley Stage(Sx).   [facility]: [facility] KRaliegh Ipand holding eliquis 48hrs before. -Protonix 41mpo qd to continue -CBC, CMP, PT INR at the time of procedures.    HPI:    Lawrence [name] a [age].o. male  With multiple comorbid conditions as listed below, including A. fib on Eliquis, dCHF EF 40-45% 03/2021 OSA, CAD, NASH cirrhosis  Heme positive stools 2/3 at Dr. McRemi Deterffice. S/P multiple iron infusions for IDA. Hb 10 (from 11.5 11/2019) with iron saturation of 18%  No nausea, vomiting, heartburn, regurgitation, odynophagia or dysphagia.  No constipation. No melena or hematochezia. No unintentional weight loss. No abdominal pain.  He has baseline diarrhea ever since he had X-Lap with distal TI/right hemicolectomy 06/2018 at the frequency of 5-6 softer BMs per day.  Cholestyramine twice  daily helps.  He takes more, he gets constipated.  No H/O itching, skin lesions, easy bruisability, intake of OTC meds including diet pills, herbal medications, anabolic steroids or Tylenol. There is no H/O blood transfusions, IVDA or FH of liver disease. No jaundice, dark urine or pale stools. No alcohol abuse.     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 Fib/CHF on Eliquis. Neg EGD except for small HHJacobus0/2013 and neg colon except for colonic polyps and mod sig div 04/2015. VCE 12/23/2012 showed few small bowel AVMs, limited examination. Also with mild chronic renal insufficiency (Cr 1.9) negative CT scan  10/2019, 11/2012 except for indeterminate right renal lesion and ventral abdominal wall hernia  [place]/2022: Cirrhotic appearing liver with splenomegaly. No ascites   CT AP 10/2019 1. Moderate-sized ventral hernia at the superior aspect of a midline incision containing herniated small bowel loops and fat without obstruction. 2. Mild diffuse wall thickening involving the dependent gastric fundus and antrum. This may be due to chronic gastritis. This is not have the appearance of malignancy. 3. Sigmoid diverticulosis. 4. Changes of cirrhosis of the liver with borderline splenomegaly. 5. Mild prostatic hypertrophy. 6. Small left inguinal hernia containing fat.   Past Medical History:  Diagnosis Date  . Acute on chronic diastolic CHF (congestive heart failure) (HCRome1/10/2018  . Age-related cataract of both eyes 05/08/2018  . Anemia   . Anemia, iron deficiency 12/28/2014  . Arteriosclerosis of coronary  artery 05/26/2019   Formatting of this note might be different from the original. Apr 12, 2017 Entered By: West Pugh Comment: April, 2018 echocardiogram = normal  . Arthritis    knees  . Benign neoplasm of colon 02/15/2021   Dec 20, 1999 Entered By: Everitt Amber Comment: 3/00 colonoscopy [facility]: tubular adenomaMay 01,  2003 Entered By: Everitt Amber Comment: 10/01 colonoscopy: HYPERPLASTIC POLYP  . Bradycardia with 31-40 beats per minute 08/21/2017  . CAP (community acquired pneumonia) 01/02/2018  . Cataracts, bilateral    removed   . Cervical spondylosis 05/08/2018  . CHF (congestive heart failure), NYHA class II, chronic, diastolic ([place]) 4/00/8676  . Chronic airway obstruction, not elsewhere classified    patient denies this dx on 12/04/19 - (Dr Melvyn Novas dx 02/2013)  . Chronic ischemic heart disease, unspecified   . Chronic obstructive pulmonary disease ([place]) 01/22/2013   Formatting of this note might be different from the original. Overview:  Followed in Pulmonary clinic/ [facility]/ [place]  - PFTs 03/10/2013 FEV1  2.61 (86%) ratio 76 and no better p B2 with DLCO 66 corrects to 86%  Last Assessment & Plan:  - PFTs 03/10/2013 FEV1  2.61 (86%) ratio 76 and no better p B2 with DLCO 66 corrects to 86%  He does not have significant copd and never will.  I reviewed   . CKD (chronic kidney disease) stage 3, GFR 30-59 ml/min (HCC) 07/26/2018  . Coronary artery disease   . Coronary artery disease involving native coronary artery of native heart without angina pectoris 01/09/2016  . Diabetes mellitus    type II, neuropathy,   . 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 Diverticulosis of colon 02/15/2021  . Drug therapy 09/19/2020  . Drug-induced acute renal failure ([place]) 02/15/2021  . Dyslipidemia 01/09/2016  . Dystrophia unguium 02/15/2021  . ED (erectile dysfunction) of organic origin 07/05/2014  . Enthesopathy of ankle and tarsus 02/15/2021  . Erectile dysfunction due to arterial insufficiency 07/20/2015  . Esophageal reflux 05/08/2018  . Essential hypertension 05/08/2018  . Essential hypertension, benign   . GI bleed 07/21/2018  . Gout 05/08/2018  . Hearing loss    wears hearing aids  . Heart failure ([place]) 05/08/2018  . Hyperlipidemia, mixed   . Mixed hyperlipidemia 05/08/2018  . Neck pain 02/15/2021  . Nodule of kidney     incidentally found on CT abdomen 11/2012.  Pending Urology evaluation.   . Nontoxic multinodular goiter   . Nontoxic uninodular goiter 02/15/2021  . Obesity   . Obstructive sleep apnea (adult) (pediatric) 01/09/2016  . Other testicular hypofunction   . Pacemaker 09/13/2017   St Jude Device   . Paroxysmal atrial fibrillation ([place]) 11/26/2016  . Peripheral arterial occlusive disease ([place]) 02/15/2021  . Peripheral vascular disease ([place]) 01/09/2016   Formatting of this note might be different from the original. Right SFA by segmental pressure, conservative management  . Peripheral vascular disease, unspecified ([place]) 01/09/2016   Overview:  Right SFA by segmental pressure, conservative management  . Permanent atrial fibrillation ([place])   . Peyronie's disease 07/20/2015  . Pneumonia    x 1  . Presence of permanent cardiac pacemaker    ST Jude PPM  . 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 PTSD (post-traumatic stress disorder) 05/08/2018  . PUD (peptic ulcer disease)   . Pulmonary hypertension ([place]) 01/09/2016  . Renal cyst, right 07/05/2014  . Renal insufficiency, mild    hx of  . Renal lesion 05/25/2014  . Secondary polycythemia  02/15/2021  . Sensorineural hearing loss (SNHL) of both ears 05/08/2018  . Sleep apnea    Does not use CPAP in last 3 months per patient 12/04/19  . Small bowel ischemia w pneumatosis s/p ileocectomy 07/21/2018   . Stage 2 chronic kidney disease 05/08/2018  . Status post coronary artery bypass graft 01/09/2016  . Tear of medial cartilage or meniscus of knee, current 02/15/2021   Jan 13, 2002 Entered By: Everitt Amber Comment: Left side. Probable. Patient unable to have MRI.  Marland Kitchen Testicular hypofunction 05/08/2018  . Tinnitus of both ears 05/08/2018  . Type 2 diabetes mellitus with diabetic neuropathy ([place]) 05/08/2018    Past Surgical History:  Procedure Laterality Date  . ATRIAL FIBRILLATION ABLATION N/A 02/05/2019   Procedure: ATRIAL FIBRILLATION  ABLATION;  Surgeon: Constance Haw, MD;  Location: [facility];  Service: Cardiovascular;  Laterality: N/A;  . CHOLECYSTECTOMY    . COLONOSCOPY  05/11/2015   Colonic polyps statuts post polypectomy. Moderate predominalty sigmoid diverticulosis. Internal hemorrhoids. Tubular adenoma.   . CORONARY ARTERY BYPASS GRAFT  2007   x 4  . ESOPHAGOGASTRODUODENOSCOPY     10/06/2012:  hiatal hernia, moderate gastritis.  2012: Colonoscopy at [facility]:  one polyp.  Due next 2017  . EYE SURGERY     left eye metal removed   . EYE SURGERY     bilateral cataracts removed  . INCISIONAL HERNIA REPAIR N/A 12/08/2019   Procedure: OPEN INCISIONAL HERNIA REPAIR WITH MESH;  Surgeon: Erroll Luna, MD;  Location: [place];  Service: General;  Laterality: N/A;  . INGUINAL HERNIA REPAIR Left 12/08/2019   Procedure: HERNIA REPAIR LEFT  INGUINAL ADULT WITH MESH;  Surgeon: Erroll Luna, MD;  Location: [place];  Service: General;  Laterality: Left;  . KNEE DEBRIDEMENT Right   . LAPAROTOMY N/A 07/21/2018   Procedure: EXPLORATORY LAPAROTOMY WITH SMALL BOWEL RESECTION;  Surgeon: Erroll Luna, MD;  Location: [place];  Service: General;  Laterality: N/A;  . PACEMAKER IMPLANT N/A 08/22/2017   Procedure: Pacemaker Implant; St. Jude PPM  Surgeon: Constance Haw, MD;  Location: [place];  Service: Cardiovascular;  Laterality: N/A;  . SHOULDER SURGERY     left shoulder  . TONSILECTOMY, ADENOIDECTOMY, BILATERAL MYRINGOTOMY AND TUBES    . WISDOM TOOTH EXTRACTION      Family History  Problem Relation Age of Onset  . Colon cancer Mother   . Hypertension Mother   . Kidney cancer Mother   . Cancer Mother        colon  . Esophageal cancer Father   . Stroke Father   . Cancer Father        esophageal  . Asthma Sister   . Emphysema Other     Social History   Tobacco Use  . Smoking status: Former Smoker    Packs/day: 1.50    Years: 30.00    Pack years: 45.00    Types: Cigarettes    Quit date: 2006     Years since quitting: 16.3  . Smokeless tobacco: Former Systems developer    Types: Secondary school teacher  . Vaping Use: Never used  Substance Use Topics  . Alcohol use: Yes    Comment: rare  . Drug use: No    Current Outpatient Medications  Medication Sig Dispense Refill  . allopurinol (ZYLOPRIM) 100 MG tablet Take 100 mg by mouth daily.    Marland Kitchen apixaban (ELIQUIS) 5 MG TABS tablet Take 5 mg by mouth 2 (two)  times daily.    Marland Kitchen atorvastatin (LIPITOR) 20 MG tablet Take 1 tablet (20 mg total) by mouth daily. 90 tablet 3  . Capsaicin 0.1 % CREA Apply 1 application topically at bedtime.     . cholecalciferol (VITAMIN D) 25 MCG (1000 UNIT) tablet Take 1,000 Units by mouth daily.     . cholestyramine (QUESTRAN) 4 g packet Take 4 g by mouth 4 (four) times daily as needed (diarrhea).    . colchicine 0.6 MG tablet Take 0.6 mg by mouth as needed (Gout). Up to 3 tablet as needed    . furosemide (LASIX) 40 MG tablet Take 40 mg by mouth daily.     . Ipratropium-Albuterol (COMBIVENT) 20-100 MCG/ACT AERS respimat Inhale 1 puff into the lungs daily as needed for wheezing or shortness of breath.     Marland Kitchen ipratropium-albuterol (DUONEB) 0.5-2.5 (3) MG/3ML SOLN Take 3 mLs by nebulization.    . lidocaine (LIDODERM) 5 % Place 1-3 patches onto the skin daily. Remove & Discard patch within 12 hours or as directed by MD    . metolazone (ZAROXOLYN) 2.5 MG tablet Take 2.5 mg by mouth daily. 20 minutes before Furosemide    . metoprolol tartrate (LOPRESSOR) 100 MG tablet TAKE 1 TABLET TWICE A DAY ( DOSE INCREASE PER DR CAMNITZ ) 180 tablet 2  . nitroGLYCERIN (NITROSTAT) 0.4 MG SL tablet Place 0.4 mg under the tongue every 5 (five) minutes as needed for chest pain.     . Oxcarbazepine (TRILEPTAL) 300 MG tablet Take 150-300 mg by mouth as directed. Take one tab in the morning, one tab at noon  and two tabs at bedtime  for neuropathic pain    . OZEMPIC, 0.25 OR 0.5 MG/DOSE, 2 MG/1.5ML SOPN Inject 0.25 mg into the skin once a week.    .  pantoprazole (PROTONIX) 40 MG tablet Take 40 mg by mouth daily before breakfast.      No current facility-administered medications for this visit.    Allergies  Allergen Reactions  . Ciprofloxacin Other (See Comments)    Atrial fib,  renal failure  . Sulfamethoxazole-Trimethoprim Other (See Comments)    Atrial fib,  renal failure, potassium level spiked   . Lisinopril Nausea And Vomiting and Other (See Comments)    GI Upset  . Naproxen Nausea And Vomiting and Other (See Comments)    GI Upset  . Omeprazole     Review of Systems:  neg    Physical Exam:    BP 118/60   Pulse 64   Ht 6' (1.829 m)   Wt 206 lb (93.4 kg) Comment: verbalized  BMI 27.94 kg/m  Filed Weights   04/19/21 1320  Weight: 206 lb (93.4 kg)   Constitutional:  Well-developed, in no acute distress. Psychiatric: Normal mood and affect. Behavior is normal. HEENT: Pupils normal.  Conjunctivae are normal. No scleral icterus. Neck supple.  Cardiovascular: Normal rate, regular rhythm. No edema.  Pacemaker. Pulmonary/chest: Decreased bilateral breath sounds. Abdominal: Soft, nondistended. Nontender. Bowel sounds active throughout. There are no masses palpable. No hepatomegaly. Has ventral hernia Rectal:  defered Neurological: Alert and oriented to person place and time. Skin: Skin is warm and dry. No rashes noted. Has left knee brace.  Data Reviewed: I have personally reviewed following labs and imaging studies  CBC: CBC Latest Ref Rng & Units 03/24/2021 12/04/2019 01/27/2019  WBC - 9.0 8.8 12.3(H)  Hemoglobin 13.5 - 17.5 10.0(A) 11.5(L) 11.7(L)  Hematocrit 41 - 53 31(A) 36.0(L) 35.2(L)  Platelets 150 -  399 168 208 349    CMP: CMP Latest Ref Rng & Units 03/24/2021 12/08/2020 12/04/2019  Glucose 70 - 99 mg/dL - - 169(H)  BUN 4 - 21 44(A) - 24(H)  Creatinine 0.6 - 1.3 1.7(A) - 1.34(H)  Sodium 137 - 147 140 - 139  Potassium 3.4 - 5.3 4.5 - 4.1  Chloride 99 - 108 104 - 107  CO2 13 - 22 25(A) - 22  Calcium  8.7 - 10.7 8.8 - 8.8(L)  Total Protein 6.5 - 8.1 g/dL - - 7.0  Total Bilirubin 0.3 - 1.2 mg/dL - - 0.9  Alkaline Phos 25 - 125 241(A) 339(H) 198(H)  AST 14 - 40 35 - 24  ALT 10 - 40 21 - 26   Hepatic Function Latest Ref Rng & Units 03/24/2021 12/08/2020 12/04/2019  Total Protein 6.5 - 8.1 g/dL - - 7.0  Albumin 3.5 - 5.0 4.2 - 3.6  AST 14 - 40 35 - 24  ALT 10 - 40 21 - 26  Alk Phosphatase 25 - 125 241(A) 339(H) 198(H)  Total Bilirubin 0.3 - 1.2 mg/dL - - 0.9  Bilirubin, Direct 0.1 - 0.5 mg/dL - - -      Carmell Austria, MD 04/19/2021, 1:52 PM  Cc: [name], MD

## 2021-04-19 NOTE — Telephone Encounter (Signed)
Patient with diagnosis of afib on Eliquis for anticoagulation.    Procedure: EGD/colonoscopy Date of procedure: 05/09/21  CHA2DS2-VASc Score = 6  This indicates a 9.7% annual risk of stroke. The patient's score is based upon: CHF History: Yes HTN History: Yes Diabetes History: Yes Stroke History: No Vascular Disease History: Yes Age Score: 2 Gender Score: 0     CrCl 41.2 ml/min Platelet count 168  Per office protocol, patient can hold Eliquis for 2 days prior to procedure.

## 2021-04-19 NOTE — Progress Notes (Signed)
Chief Complaint:   Referring Provider:  [name], MD      ASSESSMENT AND PLAN;   #1. Heme + stools with IDA. Has associated anemia of chronic disease d/t CKD.  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 Fib/CHF on Eliquis. Neg EGD except for small [place] 09/2012 and neg colon except for colonic polyps and mod sig div 04/2015. VCE 12/23/2012 showed few small bowel AVMs, limited examination. Also with mild chronic renal insufficiency (Cr 1.9) negative CT scan  10/2019, 11/2012 except for indeterminate right renal lesion and ventral abdominal wall hernia  #2. 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   #2. 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 Neg celiac, ceruloplasmin, acute hepatitis panel, AMA. S/P vaccines for hep A/B. Nl AFP Neg CT 11/13/2019 except for cirrhosis with mild splenomegaly. No ascites. Neg [place] 12/2020 for any liver masses.  #3.  GERD with small HH.  #4.  FH of colon cancer (mother>26yr and FH of eso Ca (father)  #5.  Ventral incisional hernia.  Conservative management for now per Lawrence. CBrantley Stage(Sx).   [facility] Ipand holding eliquis 48hrs before. -Protonix 41mpo qd to continue -CBC, CMP, PT INR at the time of procedures.    HPI:    Lawrence [name] a [age].o. male  With multiple comorbid conditions as listed below, including A. fib on Eliquis, dCHF EF 40-45% 03/2021 OSA, CAD, NASH cirrhosis  Heme positive stools 2/3 at Lawrence [name]. S/P multiple iron infusions for IDA. Hb 10 (from 11.5 11/2019) with iron saturation of 18%  No nausea, vomiting, heartburn, regurgitation, odynophagia or dysphagia.  No constipation. No melena or hematochezia. No unintentional weight loss. No abdominal pain.  He has baseline diarrhea ever since he had X-Lap with distal TI/right hemicolectomy 06/2018 at the frequency of 5-6 softer BMs per day.  Cholestyramine twice  daily helps.  He takes more, he gets constipated.  No H/O itching, skin lesions, easy bruisability, intake of OTC meds including diet pills, herbal medications, anabolic steroids or Tylenol. There is no H/O blood transfusions, IVDA or FH of liver disease. No jaundice, dark urine or pale stools. No alcohol abuse.     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 Fib/CHF on Eliquis. Neg EGD except for small HHJacobus0/2013 and neg colon except for colonic polyps and mod sig div 04/2015. VCE 12/23/2012 showed few small bowel AVMs, limited examination. Also with mild chronic renal insufficiency (Cr 1.9) negative CT scan  10/2019, 11/2012 except for indeterminate right renal lesion and ventral abdominal wall hernia  [place]/2022: Cirrhotic appearing liver with splenomegaly. No ascites   CT AP 10/2019 1. Moderate-sized ventral hernia at the superior aspect of a midline incision containing herniated small bowel loops and fat without obstruction. 2. Mild diffuse wall thickening involving the dependent gastric fundus and antrum. This may be due to chronic gastritis. This is not have the appearance of malignancy. 3. Sigmoid diverticulosis. 4. Changes of cirrhosis of the liver with borderline splenomegaly. 5. Mild prostatic hypertrophy. 6. Small left inguinal hernia containing fat.   Past Medical History:  Diagnosis Date  . Acute on chronic diastolic CHF (congestive heart failure) (HCRome1/10/2018  . Age-related cataract of both eyes 05/08/2018  . Anemia   . Anemia, iron deficiency 12/28/2014  . Arteriosclerosis of coronary  artery 05/26/2019   Formatting of this note might be different from the original. Apr 12, 2017 Entered By: West Pugh Comment: April, 2018 echocardiogram = normal  . Arthritis    knees  . Benign neoplasm of colon 02/15/2021   Dec 20, 1999 Entered By: Everitt Amber Comment: 3/00 colonoscopy [facility]: tubular adenomaMay 01,  2003 Entered By: Everitt Amber Comment: 10/01 colonoscopy: HYPERPLASTIC POLYP  . Bradycardia with 31-40 beats per minute 08/21/2017  . CAP (community acquired pneumonia) 01/02/2018  . Cataracts, bilateral    removed   . Cervical spondylosis 05/08/2018  . CHF (congestive heart failure), NYHA class II, chronic, diastolic ([place]) 4/00/8676  . Chronic airway obstruction, not elsewhere classified    patient denies this dx on 12/04/19 - (Lawrence Melvyn Novas dx 02/2013)  . Chronic ischemic heart disease, unspecified   . Chronic obstructive pulmonary disease ([place]) 01/22/2013   Formatting of this note might be different from the original. Overview:  Followed in Pulmonary clinic/ [facility]/ [place]  - PFTs 03/10/2013 FEV1  2.61 (86%) ratio 76 and no better p B2 with DLCO 66 corrects to 86%  Last Assessment & Plan:  - PFTs 03/10/2013 FEV1  2.61 (86%) ratio 76 and no better p B2 with DLCO 66 corrects to 86%  He does not have significant copd and never will.  I reviewed   . CKD (chronic kidney disease) stage 3, GFR 30-59 ml/min (HCC) 07/26/2018  . Coronary artery disease   . Coronary artery disease involving native coronary artery of native heart without angina pectoris 01/09/2016  . Diabetes mellitus    type II, neuropathy,   . 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 Diverticulosis of colon 02/15/2021  . Drug therapy 09/19/2020  . Drug-induced acute renal failure ([place]) 02/15/2021  . Dyslipidemia 01/09/2016  . Dystrophia unguium 02/15/2021  . ED (erectile dysfunction) of organic origin 07/05/2014  . Enthesopathy of ankle and tarsus 02/15/2021  . Erectile dysfunction due to arterial insufficiency 07/20/2015  . Esophageal reflux 05/08/2018  . Essential hypertension 05/08/2018  . Essential hypertension, benign   . GI bleed 07/21/2018  . Gout 05/08/2018  . Hearing loss    wears hearing aids  . Heart failure ([place]) 05/08/2018  . Hyperlipidemia, mixed   . Mixed hyperlipidemia 05/08/2018  . Neck pain 02/15/2021  . Nodule of kidney     incidentally found on CT abdomen 11/2012.  Pending Urology evaluation.   . Nontoxic multinodular goiter   . Nontoxic uninodular goiter 02/15/2021  . Obesity   . Obstructive sleep apnea (adult) (pediatric) 01/09/2016  . Other testicular hypofunction   . Pacemaker 09/13/2017   St Jude Device   . Paroxysmal atrial fibrillation ([place]) 11/26/2016  . Peripheral arterial occlusive disease ([place]) 02/15/2021  . Peripheral vascular disease ([place]) 01/09/2016   Formatting of this note might be different from the original. Right SFA by segmental pressure, conservative management  . Peripheral vascular disease, unspecified ([place]) 01/09/2016   Overview:  Right SFA by segmental pressure, conservative management  . Permanent atrial fibrillation ([place])   . Peyronie's disease 07/20/2015  . Pneumonia    x 1  . Presence of permanent cardiac pacemaker    ST Jude PPM  . 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 PTSD (post-traumatic stress disorder) 05/08/2018  . PUD (peptic ulcer disease)   . Pulmonary hypertension ([place]) 01/09/2016  . Renal cyst, right 07/05/2014  . Renal insufficiency, mild    hx of  . Renal lesion 05/25/2014  . Secondary polycythemia  02/15/2021  . Sensorineural hearing loss (SNHL) of both ears 05/08/2018  . Sleep apnea    Does not use CPAP in last 3 months per patient 12/04/19  . Small bowel ischemia w pneumatosis s/p ileocectomy 07/21/2018   . Stage 2 chronic kidney disease 05/08/2018  . Status post coronary artery bypass graft 01/09/2016  . Tear of medial cartilage or meniscus of knee, current 02/15/2021   Jan 13, 2002 Entered By: Everitt Amber Comment: Left side. Probable. Patient unable to have MRI.  Marland Kitchen Testicular hypofunction 05/08/2018  . Tinnitus of both ears 05/08/2018  . Type 2 diabetes mellitus with diabetic neuropathy ([place]) 05/08/2018    Past Surgical History:  Procedure Laterality Date  . ATRIAL FIBRILLATION ABLATION N/A 02/05/2019   Procedure: ATRIAL FIBRILLATION  ABLATION;  Surgeon: Constance Haw, MD;  Location: [facility];  Service: Cardiovascular;  Laterality: N/A;  . CHOLECYSTECTOMY    . COLONOSCOPY  05/11/2015   Colonic polyps statuts post polypectomy. Moderate predominalty sigmoid diverticulosis. Internal hemorrhoids. Tubular adenoma.   . CORONARY ARTERY BYPASS GRAFT  2007   x 4  . ESOPHAGOGASTRODUODENOSCOPY     10/06/2012:  hiatal hernia, moderate gastritis.  2012: Colonoscopy at [facility]:  one polyp.  Due next 2017  . EYE SURGERY     left eye metal removed   . EYE SURGERY     bilateral cataracts removed  . INCISIONAL HERNIA REPAIR N/A 12/08/2019   Procedure: OPEN INCISIONAL HERNIA REPAIR WITH MESH;  Surgeon: Erroll Luna, MD;  Location: [place];  Service: General;  Laterality: N/A;  . INGUINAL HERNIA REPAIR Left 12/08/2019   Procedure: HERNIA REPAIR LEFT  INGUINAL ADULT WITH MESH;  Surgeon: Erroll Luna, MD;  Location: [place];  Service: General;  Laterality: Left;  . KNEE DEBRIDEMENT Right   . LAPAROTOMY N/A 07/21/2018   Procedure: EXPLORATORY LAPAROTOMY WITH SMALL BOWEL RESECTION;  Surgeon: Erroll Luna, MD;  Location: [place];  Service: General;  Laterality: N/A;  . PACEMAKER IMPLANT N/A 08/22/2017   Procedure: Pacemaker Implant; St. Jude PPM  Surgeon: Constance Haw, MD;  Location: [place] [facility];  Service: Cardiovascular;  Laterality: N/A;  . SHOULDER SURGERY     left shoulder  . TONSILECTOMY, ADENOIDECTOMY, BILATERAL MYRINGOTOMY AND TUBES    . WISDOM TOOTH EXTRACTION      Family History  Problem Relation Age of Onset  . Colon cancer Mother   . Hypertension Mother   . Kidney cancer Mother   . Cancer Mother        colon  . Esophageal cancer Father   . Stroke Father   . Cancer Father        esophageal  . Asthma Sister   . Emphysema Other     Social History   Tobacco Use  . Smoking status: Former Smoker    Packs/day: 1.50    Years: 30.00    Pack years: 45.00    Types: Cigarettes    Quit date: 2006     Years since quitting: 16.3  . Smokeless tobacco: Former Systems developer    Types: Secondary school teacher  . Vaping Use: Never used  Substance Use Topics  . Alcohol use: Yes    Comment: rare  . Drug use: No    Current Outpatient Medications  Medication Sig Dispense Refill  . allopurinol (ZYLOPRIM) 100 MG tablet Take 100 mg by mouth daily.    Marland Kitchen apixaban (ELIQUIS) 5 MG TABS tablet Take 5 mg by mouth 2 (two)  times daily.    Marland Kitchen atorvastatin (LIPITOR) 20 MG tablet Take 1 tablet (20 mg total) by mouth daily. 90 tablet 3  . Capsaicin 0.1 % CREA Apply 1 application topically at bedtime.     . cholecalciferol (VITAMIN D) 25 MCG (1000 UNIT) tablet Take 1,000 Units by mouth daily.     . cholestyramine (QUESTRAN) 4 g packet Take 4 g by mouth 4 (four) times daily as needed (diarrhea).    . colchicine 0.6 MG tablet Take 0.6 mg by mouth as needed (Gout). Up to 3 tablet as needed    . furosemide (LASIX) 40 MG tablet Take 40 mg by mouth daily.     . Ipratropium-Albuterol (COMBIVENT) 20-100 MCG/ACT AERS respimat Inhale 1 puff into the lungs daily as needed for wheezing or shortness of breath.     Marland Kitchen ipratropium-albuterol (DUONEB) 0.5-2.5 (3) MG/3ML SOLN Take 3 mLs by nebulization.    . lidocaine (LIDODERM) 5 % Place 1-3 patches onto the skin daily. Remove & Discard patch within 12 hours or as directed by MD    . metolazone (ZAROXOLYN) 2.5 MG tablet Take 2.5 mg by mouth daily. 20 minutes before Furosemide    . metoprolol tartrate (LOPRESSOR) 100 MG tablet TAKE 1 TABLET TWICE A DAY ( DOSE INCREASE PER Lawrence CAMNITZ ) 180 tablet 2  . nitroGLYCERIN (NITROSTAT) 0.4 MG SL tablet Place 0.4 mg under the tongue every 5 (five) minutes as needed for chest pain.     . Oxcarbazepine (TRILEPTAL) 300 MG tablet Take 150-300 mg by mouth as directed. Take one tab in the morning, one tab at noon  and two tabs at bedtime  for neuropathic pain    . OZEMPIC, 0.25 OR 0.5 MG/DOSE, 2 MG/1.5ML SOPN Inject 0.25 mg into the skin once a week.    .  pantoprazole (PROTONIX) 40 MG tablet Take 40 mg by mouth daily before breakfast.      No current facility-administered medications for this visit.    Allergies  Allergen Reactions  . Ciprofloxacin Other (See Comments)    Atrial fib,  renal failure  . Sulfamethoxazole-Trimethoprim Other (See Comments)    Atrial fib,  renal failure, potassium level spiked   . Lisinopril Nausea And Vomiting and Other (See Comments)    GI Upset  . Naproxen Nausea And Vomiting and Other (See Comments)    GI Upset  . Omeprazole     Review of Systems:  neg    Physical Exam:    BP 118/60   Pulse 64   Ht 6' (1.829 m)   Wt 206 lb (93.4 kg) Comment: verbalized  BMI 27.94 kg/m  Filed Weights   04/19/21 1320  Weight: 206 lb (93.4 kg)   Constitutional:  Well-developed, in no acute distress. Psychiatric: Normal mood and affect. Behavior is normal. HEENT: Pupils normal.  Conjunctivae are normal. No scleral icterus. Neck supple.  Cardiovascular: Normal rate, regular rhythm. No edema.  Pacemaker. Pulmonary/chest: Decreased bilateral breath sounds. Abdominal: Soft, nondistended. Nontender. Bowel sounds active throughout. There are no masses palpable. No hepatomegaly. Has ventral hernia Rectal:  defered Neurological: Alert and oriented to person place and time. Skin: Skin is warm and dry. No rashes noted. Has left knee brace.  Data Reviewed: I have personally reviewed following labs and imaging studies  CBC: CBC Latest Ref Rng & Units 03/24/2021 12/04/2019 01/27/2019  WBC - 9.0 8.8 12.3(H)  Hemoglobin 13.5 - 17.5 10.0(A) 11.5(L) 11.7(L)  Hematocrit 41 - 53 31(A) 36.0(L) 35.2(L)  Platelets 150 -  399 168 208 349    CMP: CMP Latest Ref Rng & Units 03/24/2021 12/08/2020 12/04/2019  Glucose 70 - 99 mg/dL - - 169(H)  BUN 4 - 21 44(A) - 24(H)  Creatinine 0.6 - 1.3 1.7(A) - 1.34(H)  Sodium 137 - 147 140 - 139  Potassium 3.4 - 5.3 4.5 - 4.1  Chloride 99 - 108 104 - 107  CO2 13 - 22 25(A) - 22  Calcium  8.7 - 10.7 8.8 - 8.8(L)  Total Protein 6.5 - 8.1 g/dL - - 7.0  Total Bilirubin 0.3 - 1.2 mg/dL - - 0.9  Alkaline Phos 25 - 125 241(A) 339(H) 198(H)  AST 14 - 40 35 - 24  ALT 10 - 40 21 - 26   Hepatic Function Latest Ref Rng & Units 03/24/2021 12/08/2020 12/04/2019  Total Protein 6.5 - 8.1 g/dL - - 7.0  Albumin 3.5 - 5.0 4.2 - 3.6  AST 14 - 40 35 - 24  ALT 10 - 40 21 - 26  Alk Phosphatase 25 - 125 241(A) 339(H) 198(H)  Total Bilirubin 0.3 - 1.2 mg/dL - - 0.9  Bilirubin, Direct 0.1 - 0.5 mg/dL - - -      Carmell Austria, MD 04/19/2021, 1:52 PM  Cc: Lawrence Sheriff, MD

## 2021-04-19 NOTE — Telephone Encounter (Signed)
[facility] Pre-operative Risk Assessment     Request for surgical clearance:     Endoscopy Procedure  What type of surgery is being performed?     EGD/Colon  When is this surgery scheduled?     05/09/2021  What type of clearance is required ?   Pharmacy  Are there any medications that need to be held prior to surgery and how long? Eliquis 2 day hold  Practice name and name of physician performing surgery?      [facility]  What is your office phone and fax number?      Phone- [phone]  Fax878 [phone]  Anesthesia type (None, local, MAC, general) ?       MAC

## 2021-04-24 ENCOUNTER — Encounter: Payer: Self-pay | Admitting: Hematology and Oncology

## 2021-04-24 ENCOUNTER — Inpatient Hospital Stay (INDEPENDENT_AMBULATORY_CARE_PROVIDER_SITE_OTHER): Payer: Medicare Other | Admitting: Hematology and Oncology

## 2021-04-24 ENCOUNTER — Telehealth: Payer: Self-pay | Admitting: Hematology and Oncology

## 2021-04-24 ENCOUNTER — Inpatient Hospital Stay: Payer: Medicare Other | Attending: Oncology

## 2021-04-24 ENCOUNTER — Other Ambulatory Visit: Payer: Self-pay

## 2021-04-24 VITALS — BP 124/60 | HR 75 | Temp 97.8°F | Resp 20 | Ht 72.0 in | Wt 216.6 lb

## 2021-04-24 DIAGNOSIS — D509 Iron deficiency anemia, unspecified: Secondary | ICD-10-CM

## 2021-04-24 DIAGNOSIS — Z79899 Other long term (current) drug therapy: Secondary | ICD-10-CM | POA: Diagnosis not present

## 2021-04-24 DIAGNOSIS — E1122 Type 2 diabetes mellitus with diabetic chronic kidney disease: Secondary | ICD-10-CM | POA: Insufficient documentation

## 2021-04-24 DIAGNOSIS — N189 Chronic kidney disease, unspecified: Secondary | ICD-10-CM | POA: Insufficient documentation

## 2021-04-24 DIAGNOSIS — Z7984 Long term (current) use of oral hypoglycemic drugs: Secondary | ICD-10-CM | POA: Diagnosis not present

## 2021-04-24 DIAGNOSIS — D649 Anemia, unspecified: Secondary | ICD-10-CM | POA: Diagnosis not present

## 2021-04-24 DIAGNOSIS — D500 Iron deficiency anemia secondary to blood loss (chronic): Secondary | ICD-10-CM

## 2021-04-24 LAB — BASIC METABOLIC PANEL
BUN: 47 — AB (ref 4–21)
CO2: 28 — AB (ref 13–22)
Chloride: 99 (ref 99–108)
Creatinine: 1.5 — AB (ref 0.6–1.3)
Glucose: 235
Potassium: 4.4 (ref 3.4–5.3)
Sodium: 136 — AB (ref 137–147)

## 2021-04-24 LAB — CBC: RBC: 3.42 — AB (ref 3.87–5.11)

## 2021-04-24 LAB — HEPATIC FUNCTION PANEL
ALT: 36 (ref 10–40)
AST: 37 (ref 14–40)
Alkaline Phosphatase: 289 — AB (ref 25–125)
Bilirubin, Total: 0.7

## 2021-04-24 LAB — IRON AND TIBC
Iron: 93 ug/dL (ref 45–182)
Saturation Ratios: 31 % (ref 17.9–39.5)
TIBC: 301 ug/dL (ref 250–450)
UIBC: 208 ug/dL

## 2021-04-24 LAB — CBC AND DIFFERENTIAL
HCT: 34 — AB (ref 41–53)
Hemoglobin: 10.8 — AB (ref 13.5–17.5)
Neutrophils Absolute: 6.83
Platelets: 172 (ref 150–399)
WBC: 9.1

## 2021-04-24 LAB — COMPREHENSIVE METABOLIC PANEL
Albumin: 3.8 (ref 3.5–5.0)
Calcium: 8.7 (ref 8.7–10.7)

## 2021-04-24 LAB — FERRITIN: Ferritin: 222 ng/mL (ref 24–336)

## 2021-04-24 NOTE — Progress Notes (Signed)
[facility]  [address] [place],  [place]  [address] [phone]  Clinic Day:  04/24/2021  Referring physician: [name], MD   CHIEF COMPLAINT:  CC:  A 75 year old male with longstanding iron deficiency here for evaluation after receiving Feraheme x 2 doses.  Current Treatment:  Surveillance   HISTORY OF PRESENT ILLNESS:  Lawrence [name] is a 75 y.o. male with longstanding iron deficiency, who has required repeated iron infusions, averaging about every 6 months.  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  He required multiple frequent IV iron infusions for several years.  We began seeing him in July 2016 and he had just received IV Feraheme.  In July 2019, he was hospitalized with small bowel ischemia.  He underwent surgical resection with ileocecectomy on July 29th.  In retrospect, I feel he was likely losing blood from the ischemic bowel for some time.  He had severe diarrhea after his surgery, but recovered well.  He received IV Feraheme again in August of 2019, but has not required any further infusions after treatment of his bowel ischemia.  He had been in atrial fibrillation since mid October 2019, and had a cardiac ablation last year.  He has multiple medical comorbidities including the heart failure, chronic kidney disease, gout, atrial fibrillation, for which he has undergone cardioversion and is on apixaban.  He underwent abdominal hernia repair x2 in December with Dr. Brantley Stage.  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  His hemoglobin has mildly decreased from 12.5 to 11.8 in April 2021.    INTERVAL HISTORY:  Lawrence [name] is here for evaluation of iron deficiency anemia. He was evaluated last month and found to be again iron deficient with decreasing hemoglobin to 10.0. He received 2 doses of feraheme in  April with the last dose on the 13th. He reports feeling better with less fatigue. He was referred to Dr. Lyndel Safe for GI evaluation and is scheduled for colonoscopy 05-13. He denies fever, chills, nausea or vomiting. He denies shortness of breath, cough or chest pain. He denies issue with bowel or bladder. CBC today reveals hemoglobin 10.8 with iron studies pending. CMP is unremarkable.  REVIEW OF SYSTEMS:  Review of Systems  Gastrointestinal: Positive for diarrhea.  Psychiatric/Behavioral: Positive for sleep disturbance.     VITALS:  Blood pressure 124/60, pulse 75, temperature 97.8 F (36.6 C), temperature source Oral, resp. rate 20, height 6' (1.829 m), weight 216 lb 9.6 oz (98.2 kg), SpO2 97 %.  Wt Readings from Last 3 Encounters:  04/24/21 216 lb 9.6 oz (98.2 kg)  04/19/21 206 lb (93.4 kg)  04/04/21 210 lb 1.9 oz (95.3 kg)    Body mass index is 29.38 kg/m.  Performance status (ECOG): 1 - Symptomatic but completely ambulatory  PHYSICAL EXAM:  Physical Exam Constitutional:      General: He is not in acute distress.    Appearance: Normal appearance. He is normal weight. He is not ill-appearing, toxic-appearing or diaphoretic.  HENT:     Head: Normocephalic and atraumatic.     Nose: Nose normal. No congestion or rhinorrhea.     Mouth/Throat:     Mouth: Mucous membranes are moist.     Pharynx: Oropharynx is clear. No oropharyngeal exudate or posterior oropharyngeal erythema.  Eyes:     General: No scleral icterus.  Right eye: No discharge.        Left eye: No discharge.     Extraocular Movements: Extraocular movements intact.     Conjunctiva/sclera: Conjunctivae normal.     Pupils: Pupils are equal, round, and reactive to light.  Neck:     Vascular: No carotid bruit.  Cardiovascular:     Rate and Rhythm: Normal rate and regular rhythm.     Heart sounds: No murmur heard. No friction rub. No gallop.   Pulmonary:     Effort: Pulmonary effort is normal. No respiratory  distress.     Breath sounds: Normal breath sounds. No stridor. No wheezing, rhonchi or rales.  Chest:     Chest wall: No tenderness.  Abdominal:     General: Abdomen is flat. Bowel sounds are normal. There is no distension.     Palpations: There is no mass.     Tenderness: There is no abdominal tenderness. There is no right CVA tenderness, left CVA tenderness, guarding or rebound.     Hernia: No hernia is present.  Musculoskeletal:        General: No swelling, tenderness, deformity or signs of injury. Normal range of motion.     Cervical back: Normal range of motion and neck supple. No rigidity or tenderness.     Right lower leg: No edema.     Left lower leg: No edema.  Lymphadenopathy:     Cervical: No cervical adenopathy.  Skin:    General: Skin is warm and dry.     Capillary Refill: Capillary refill takes less than 2 seconds.     Coloration: Skin is not jaundiced or pale.     Findings: No bruising, erythema, lesion or rash.  Neurological:     General: No focal deficit present.     Mental Status: He is alert and oriented to person, place, and time. Mental status is at baseline.     Cranial Nerves: No cranial nerve deficit.     Sensory: No sensory deficit.     Motor: No weakness.     Coordination: Coordination normal.     Gait: Gait normal.     Deep Tendon Reflexes: Reflexes normal.  Psychiatric:        Mood and Affect: Mood normal.        Behavior: Behavior normal.        Thought Content: Thought content normal.        Judgment: Judgment normal.     LABS:   CBC Latest Ref Rng & Units 04/24/2021 03/24/2021 12/04/2019  WBC - 9.1 9.0 8.8  Hemoglobin 13.5 - 17.5 10.8(A) 10.0(A) 11.5(L)  Hematocrit 41 - 53 34(A) 31(A) 36.0(L)  Platelets 150 - 399 172 168 208   CMP Latest Ref Rng & Units 04/24/2021 03/24/2021 12/08/2020  Glucose 70 - 99 mg/dL - - -  BUN 4 - 21 47(A) 44(A) -  Creatinine 0.6 - 1.3 1.5(A) 1.7(A) -  Sodium 137 - 147 136(A) 140 -  Potassium 3.4 - 5.3 4.4 4.5 -   Chloride 99 - 108 99 104 -  CO2 13 - 22 28(A) 25(A) -  Calcium 8.7 - 10.7 8.7 8.8 -  Total Protein 6.5 - 8.1 g/dL - - -  Total Bilirubin 0.3 - 1.2 mg/dL - - -  Alkaline Phos 25 - 125 289(A) 241(A) 339(H)  AST 14 - 40 37 35 -  ALT 10 - 40 36 21 -      Lab Results  Component Value Date  TIBC 388 03/24/2021   TIBC 380 03/29/2015   TIBC 348 12/28/2014   FERRITIN 31 03/24/2021   FERRITIN 32 03/29/2015   FERRITIN 61 12/28/2014   IRONPCTSAT 10 (L) 03/24/2021   IRONPCTSAT 13 (L) 03/29/2015   IRONPCTSAT 15 (L) 12/28/2014   No results found for: LDH   STUDIES:  ECHOCARDIOGRAM COMPLETE  Result Date: 03/28/2021    ECHOCARDIOGRAM REPORT   Patient Name:   Lawrence [name] Date of Exam: 03/28/2021 Medical Rec #:  [id number]           Height:       72.0 in Accession #:    [id number]          Weight:       216.4 lb Date of Birth:  1946-09-27            BSA:          2.203 m Patient Age:    88 years            BP:           118/78 mmHg Patient Gender: M                   HR:           60 bpm. Exam Location:  [facility] Procedure: 2D Echo and Intracardiac Opacification Agent Indications:    Congestive Heart Failure I50.9  History:        Patient has prior history of Echocardiogram examinations, most                 recent 12/03/2019. CAD, Pacemaker, PAD; Risk                 Factors:Hypertension and Dyslipidemia.  Sonographer:    Luane School Referring Phys: [place]  Sonographer Comments: Suboptimal apical window. IMPRESSIONS  1. Left ventricular ejection fraction, by estimation, is 40 to 45%. The left ventricle has mildly decreased function. The left ventricle demonstrates global hypokinesis. There is severe concentric left ventricular hypertrophy. Left ventricular diastolic  parameters are consistent with Grade III diastolic dysfunction (restrictive). The average left ventricular global longitudinal strain is -5.3 %. The global longitudinal strain is abnormal.  2. Right ventricular  systolic function is moderately reduced. The right ventricular size is moderately enlarged. There is mildly elevated pulmonary artery systolic pressure.  3. The mitral valve is normal in structure. Trivial mitral valve regurgitation. No evidence of mitral stenosis.  4. Tricuspid valve regurgitation is mild to moderate.  5. The aortic valve is calcified. Aortic valve regurgitation is not visualized. Mild aortic valve sclerosis is present, with no evidence of aortic valve stenosis.  6. The inferior vena cava is normal in size with greater than 50% respiratory variability, suggesting right atrial pressure of 3 mmHg. FINDINGS  Left Ventricle: Left ventricular ejection fraction, by estimation, is 40 to 45%. The left ventricle has mildly decreased function. The left ventricle demonstrates global hypokinesis. Definity contrast agent was given IV to delineate the left ventricular  endocardial borders. The average left ventricular global longitudinal strain is -5.3 %. The global longitudinal strain is abnormal. The left ventricular internal cavity size was normal in size. There is severe concentric left ventricular hypertrophy. Left ventricular diastolic parameters are consistent with Grade III diastolic dysfunction (restrictive). Right Ventricle: The right ventricular size is moderately enlarged. No increase in right ventricular wall thickness. Right ventricular systolic function is moderately reduced. There is mildly elevated pulmonary artery systolic pressure. The tricuspid  regurgitant velocity is 3.00 m/s, and with an assumed right atrial pressure of 3 mmHg, the estimated right ventricular systolic pressure is 33.5 mmHg. Left Atrium: Left atrial size was normal in size. Right Atrium: Device lead noted. Right atrial size was normal in size. Pericardium: There is no evidence of pericardial effusion. Mitral Valve: The mitral valve is normal in structure. Mild mitral annular calcification. Trivial mitral valve regurgitation.  No evidence of mitral valve stenosis. Tricuspid Valve: The tricuspid valve is normal in structure. Tricuspid valve regurgitation is mild to moderate. No evidence of tricuspid stenosis. Aortic Valve: The aortic valve is calcified. Aortic valve regurgitation is not visualized. Mild aortic valve sclerosis is present, with no evidence of aortic valve stenosis. Pulmonic Valve: The pulmonic valve was not well visualized. Pulmonic valve regurgitation is trivial. No evidence of pulmonic stenosis. Aorta: The aortic root is normal in size and structure. Venous: The inferior vena cava is normal in size with greater than 50% respiratory variability, suggesting right atrial pressure of 3 mmHg. IAS/Shunts: No atrial level shunt detected by color flow Doppler. Additional Comments: A device lead is visualized.  LEFT VENTRICLE PLAX 2D LVIDd:         4.70 cm     Diastology LVIDs:         3.70 cm     LV e' medial:    3.04 cm/s LV PW:         1.50 cm     LV E/e' medial:  33.9 LV IVS:        1.50 cm     LV e' lateral:   7.09 cm/s LVOT diam:     2.10 cm     LV E/e' lateral: 14.5 LV SV:         57 LV SV Index:   26          2D Longitudinal Strain LVOT Area:     3.46 cm    2D Strain GLS Avg:     -5.3 %  LV Volumes (MOD) LV vol d, MOD A2C: 77.8 ml LV vol d, MOD A4C: 65.2 ml LV vol s, MOD A2C: 35.9 ml LV vol s, MOD A4C: 36.5 ml LV SV MOD A2C:     41.9 ml LV SV MOD A4C:     65.2 ml LV SV MOD BP:      36.2 ml RIGHT VENTRICLE RV S prime:     6.41 cm/s TAPSE (M-mode): 1.4 cm LEFT ATRIUM             Index       RIGHT ATRIUM           Index LA diam:        4.20 cm 1.91 cm/m  RA Area:     20.40 cm LA Vol (A2C):   62.5 ml 28.37 ml/m RA Volume:   60.40 ml  27.42 ml/m LA Vol (A4C):   53.4 ml 24.24 ml/m LA Biplane Vol: 60.5 ml 27.46 ml/m  AORTIC VALVE LVOT Vmax:   73.10 cm/s LVOT Vmean:  49.700 cm/s LVOT VTI:    0.166 m  AORTA Ao Root diam: 3.90 cm Ao Asc diam:  3.50 cm MITRAL VALVE                TRICUSPID VALVE MV Area (PHT): 4.41 cm     TR  Peak grad:   36.0 mmHg MV Decel Time: 172 msec     TR Vmax:  300.00 cm/s MV E velocity: 103.00 cm/s MV A velocity: 31.80 cm/s   SHUNTS MV E/A ratio:  3.24         Systemic VTI:  0.17 m                             Systemic Diam: 2.10 cm Kardie Tobb DO Electronically signed by Berniece Salines DO Signature Date/Time: 03/28/2021/5:57:17 PM    Final      Allergies:  Allergies  Allergen Reactions  . Ciprofloxacin Other (See Comments)    Atrial fib,  renal failure  . Sulfamethoxazole-Trimethoprim Other (See Comments)    Atrial fib,  renal failure, potassium level spiked   . Lisinopril Nausea And Vomiting and Other (See Comments)    GI Upset  . Naproxen Nausea And Vomiting and Other (See Comments)    GI Upset  . Omeprazole     Current Medications: Current Outpatient Medications  Medication Sig Dispense Refill  . glimepiride (AMARYL) 2 MG tablet TAKE ONE TABLET BY MOUTH TWICE A DAY FOR DIABETES    . simvastatin (ZOCOR) 20 MG tablet TAKE ONE TABLET BY MOUTH AT BEDTIME FOR CHOLESTEROL    . allopurinol (ZYLOPRIM) 100 MG tablet Take 100 mg by mouth daily.    Marland Kitchen apixaban (ELIQUIS) 5 MG TABS tablet Take 5 mg by mouth 2 (two) times daily.    Marland Kitchen atorvastatin (LIPITOR) 20 MG tablet Take 1 tablet (20 mg total) by mouth daily. 90 tablet 3  . Capsaicin 0.1 % CREA Apply 1 application topically at bedtime.     . cholecalciferol (VITAMIN D) 25 MCG (1000 UNIT) tablet Take 1,000 Units by mouth daily.     . cholestyramine (QUESTRAN) 4 g packet Take 4 g by mouth 4 (four) times daily as needed (diarrhea).    . colchicine 0.6 MG tablet Take 0.6 mg by mouth as needed (Gout). Up to 3 tablet as needed    . furosemide (LASIX) 40 MG tablet Take 40 mg by mouth daily.     . Ipratropium-Albuterol (COMBIVENT) 20-100 MCG/ACT AERS respimat Inhale 1 puff into the lungs daily as needed for wheezing or shortness of breath.     Marland Kitchen ipratropium-albuterol (DUONEB) 0.5-2.5 (3) MG/3ML SOLN Take 3 mLs by nebulization.    . lidocaine  (LIDODERM) 5 % Place 1-3 patches onto the skin daily. Remove & Discard patch within 12 hours or as directed by MD    . metolazone (ZAROXOLYN) 2.5 MG tablet Take 2.5 mg by mouth daily. 20 minutes before Furosemide    . metoprolol tartrate (LOPRESSOR) 100 MG tablet TAKE 1 TABLET TWICE A DAY ( DOSE INCREASE PER DR CAMNITZ ) 180 tablet 2  . nitroGLYCERIN (NITROSTAT) 0.4 MG SL tablet Place 0.4 mg under the tongue every 5 (five) minutes as needed for chest pain.     . Oxcarbazepine (TRILEPTAL) 300 MG tablet Take 150-300 mg by mouth as directed. Take one tab in the morning, one tab at noon  and two tabs at bedtime  for neuropathic pain    . OZEMPIC, 0.25 OR 0.5 MG/DOSE, 2 MG/1.5ML SOPN Inject 0.25 mg into the skin once a week.    . pantoprazole (PROTONIX) 40 MG tablet Take 40 mg by mouth daily before breakfast.      No current facility-administered medications for this visit.     ASSESSMENT & PLAN:   Assessment:   1.  Chronic iron deficiency in a patient with many comorbidities  who required IV iron on a regular basis for over 2 years.  Apparently a lot of his chronic blood loss was from the ischemic bowel which has been resected in 2019.  He received iron in April. Studies are pending today. If needed, we will repeat iron infusion.    2. Chronic kidney disease, mildly improved.  6. Probable recurrent GI blood loss. He did have heme + stool cultures and is scheduled for colonoscopy on 05-13.    Plan: We will evaluate his iron levels today and assess the need for more IV iron. He is scheduled with GI and hopefully, this will give [place] some answers as far as blood loss goes. We will plan to see him back in 4 weeks with repeat evaluation.  Both he and his wife verbalize understanding of and agreement to the plans discussed today. They know to call the office should any new questions or concerns arise.      Melodye Ped, NP [facility] [address] [place] [address] Dept: [phone] Dept Fax: [phone]

## 2021-04-24 NOTE — Telephone Encounter (Signed)
Per 5/2 los next appt schedule and given to patient

## 2021-04-26 NOTE — Telephone Encounter (Signed)
LVM for patient to be informed that patient can hold Eliquis 2 days prior to procedure

## 2021-05-04 ENCOUNTER — Other Ambulatory Visit: Payer: Self-pay

## 2021-05-04 ENCOUNTER — Encounter (HOSPITAL_COMMUNITY): Payer: Self-pay | Admitting: Gastroenterology

## 2021-05-05 ENCOUNTER — Other Ambulatory Visit (HOSPITAL_COMMUNITY)
Admission: RE | Admit: 2021-05-05 | Discharge: 2021-05-05 | Disposition: A | Discharge status: Home / Self Care | Payer: Medicare Other | Source: Ambulatory Visit | Attending: Gastroenterology | Admitting: Gastroenterology

## 2021-05-05 DIAGNOSIS — Z20822 Contact with and (suspected) exposure to covid-19: Secondary | ICD-10-CM | POA: Diagnosis not present

## 2021-05-05 DIAGNOSIS — Z01812 Encounter for preprocedural laboratory examination: Secondary | ICD-10-CM | POA: Diagnosis not present

## 2021-05-05 NOTE — Telephone Encounter (Signed)
Patient is aware 

## 2021-05-06 LAB — SARS CORONAVIRUS 2 (TAT 6-24 HRS): SARS Coronavirus 2: NEGATIVE

## 2021-05-08 NOTE — Anesthesia Preprocedure Evaluation (Addendum)
Anesthesia Evaluation  Patient identified by MRN, date of birth, ID band Patient awake    Reviewed: Allergy & Precautions, NPO status , Patient's Chart, lab work & pertinent test results  Airway Mallampati: II  TM Distance: >3 FB Neck ROM: Full    Dental no notable dental hx. (+) Teeth Intact, Implants, Dental Advisory Given   Pulmonary sleep apnea , COPD, former smoker,    Pulmonary exam normal breath sounds clear to auscultation       Cardiovascular hypertension, +CHF  Normal cardiovascular exam+ dysrhythmias Atrial Fibrillation + pacemaker  Rhythm:Regular Rate:Normal  12/20 Echo 1. Left ventricular ejection fraction, by visual estimation, is 50 to  55%. The left ventricle has normal function. Left ventricular septal wall  thickness was mildly increased. Mildly increased left ventricular  posterior wall thickness. There is mildly  increased left ventricular hypertrophy.  2. Left ventricular diastolic parameters are consistent with Grade II  diastolic dysfunction (pseudonormalization).  3. The left ventricle has no regional wall motion abnormalities.  4. Global right ventricle has normal systolic function.The right  ventricular size is normal. No increase in right ventricular wall  thickness.  5. Left atrial size was normal.  6. Right atrial size was moderately dilated.  7. The mitral valve is normal in structure. Mild mitral valve  regurgitation. No evidence of mitral stenosis.  8. The tricuspid valve is normal in structure. Tricuspid valve  regurgitation mild-moderate.  9. The aortic valve is abnormal. There is focal calcification on the tips  of the noncoronary cusp. Aortic valve regurgitation is not visualized. No  evidence of aortic valve sclerosis or stenosis.  10. The pulmonic valve was not well visualized. Pulmonic valve  regurgitation is trivial.  11. A pacer wire is visualized.  12. There is mild  dilatation of the ascending aorta measuring 38 mm.  13. Mildly elevated pulmonary artery systolic pressure.    Neuro/Psych PSYCHIATRIC DISORDERS Anxiety negative neurological ROS     GI/Hepatic GERD  ,Ischemic bowel dz   Endo/Other  diabetes, Type 2, Oral Hypoglycemic Agents  Renal/GU Renal InsufficiencyRenal disease     Musculoskeletal   Abdominal   Peds  Hematology  (+) anemia , Fe deficiency anemia   Anesthesia Other Findings All: Cipro, Sulfa, Lisinopril, omeprazole, naproxen  Reproductive/Obstetrics                            Anesthesia Physical Anesthesia Plan  ASA: III  Anesthesia Plan: MAC   Post-op Pain Management:    Induction:   PONV Risk Score and Plan: Treatment may vary due to age or medical condition  Airway Management Planned: Natural Airway and Nasal Cannula  Additional Equipment: None  Intra-op Plan:   Post-operative Plan:   Informed Consent: I have reviewed the patients History and Physical, chart, labs and discussed the procedure including the risks, benefits and alternatives for the proposed anesthesia with the patient or authorized representative who has indicated his/her understanding and acceptance.     Dental advisory given  Plan Discussed with:   Anesthesia Plan Comments: Jerrye Bushy with hiatal hernia, ischedic bowel disease, heme positive stool, family hx of colon cancer family hx of esophageal cancer For EGD)       Anesthesia Quick Evaluation

## 2021-05-09 ENCOUNTER — Encounter (HOSPITAL_COMMUNITY): Payer: Self-pay | Admitting: Gastroenterology

## 2021-05-09 ENCOUNTER — Ambulatory Visit (HOSPITAL_COMMUNITY): Payer: Medicare Other | Admitting: Anesthesiology

## 2021-05-09 ENCOUNTER — Encounter (HOSPITAL_COMMUNITY)
Admission: RE | Disposition: A | Discharge status: Home / Self Care | Payer: Self-pay | Source: Home / Self Care | Attending: Gastroenterology

## 2021-05-09 ENCOUNTER — Ambulatory Visit (HOSPITAL_COMMUNITY)
Admission: RE | Admit: 2021-05-09 | Discharge: 2021-05-09 | Disposition: A | Discharge status: Home / Self Care | Payer: Medicare Other | Attending: Gastroenterology | Admitting: Gastroenterology

## 2021-05-09 ENCOUNTER — Other Ambulatory Visit: Payer: Self-pay

## 2021-05-09 DIAGNOSIS — D631 Anemia in chronic kidney disease: Secondary | ICD-10-CM | POA: Insufficient documentation

## 2021-05-09 DIAGNOSIS — D123 Benign neoplasm of transverse colon: Secondary | ICD-10-CM | POA: Diagnosis not present

## 2021-05-09 DIAGNOSIS — K2270 Barrett's esophagus without dysplasia: Secondary | ICD-10-CM | POA: Insufficient documentation

## 2021-05-09 DIAGNOSIS — I8510 Secondary esophageal varices without bleeding: Secondary | ICD-10-CM

## 2021-05-09 DIAGNOSIS — K5520 Angiodysplasia of colon without hemorrhage: Secondary | ICD-10-CM | POA: Diagnosis not present

## 2021-05-09 DIAGNOSIS — Z8051 Family history of malignant neoplasm of kidney: Secondary | ICD-10-CM | POA: Insufficient documentation

## 2021-05-09 DIAGNOSIS — Z980 Intestinal bypass and anastomosis status: Secondary | ICD-10-CM | POA: Insufficient documentation

## 2021-05-09 DIAGNOSIS — K7460 Unspecified cirrhosis of liver: Secondary | ICD-10-CM | POA: Diagnosis not present

## 2021-05-09 DIAGNOSIS — I4891 Unspecified atrial fibrillation: Secondary | ICD-10-CM | POA: Diagnosis not present

## 2021-05-09 DIAGNOSIS — N183 Chronic kidney disease, stage 3 unspecified: Secondary | ICD-10-CM | POA: Insufficient documentation

## 2021-05-09 DIAGNOSIS — J449 Chronic obstructive pulmonary disease, unspecified: Secondary | ICD-10-CM | POA: Insufficient documentation

## 2021-05-09 DIAGNOSIS — K432 Incisional hernia without obstruction or gangrene: Secondary | ICD-10-CM

## 2021-05-09 DIAGNOSIS — D122 Benign neoplasm of ascending colon: Secondary | ICD-10-CM

## 2021-05-09 DIAGNOSIS — K31819 Angiodysplasia of stomach and duodenum without bleeding: Secondary | ICD-10-CM | POA: Diagnosis not present

## 2021-05-09 DIAGNOSIS — R161 Splenomegaly, not elsewhere classified: Secondary | ICD-10-CM | POA: Diagnosis not present

## 2021-05-09 DIAGNOSIS — Z7951 Long term (current) use of inhaled steroids: Secondary | ICD-10-CM | POA: Insufficient documentation

## 2021-05-09 DIAGNOSIS — Z886 Allergy status to analgesic agent status: Secondary | ICD-10-CM | POA: Insufficient documentation

## 2021-05-09 DIAGNOSIS — I509 Heart failure, unspecified: Secondary | ICD-10-CM | POA: Insufficient documentation

## 2021-05-09 DIAGNOSIS — K7581 Nonalcoholic steatohepatitis (NASH): Secondary | ICD-10-CM | POA: Diagnosis not present

## 2021-05-09 DIAGNOSIS — E1122 Type 2 diabetes mellitus with diabetic chronic kidney disease: Secondary | ICD-10-CM | POA: Diagnosis not present

## 2021-05-09 DIAGNOSIS — Z888 Allergy status to other drugs, medicaments and biological substances status: Secondary | ICD-10-CM | POA: Insufficient documentation

## 2021-05-09 DIAGNOSIS — I081 Rheumatic disorders of both mitral and tricuspid valves: Secondary | ICD-10-CM | POA: Diagnosis not present

## 2021-05-09 DIAGNOSIS — Z79899 Other long term (current) drug therapy: Secondary | ICD-10-CM | POA: Insufficient documentation

## 2021-05-09 DIAGNOSIS — K921 Melena: Secondary | ICD-10-CM | POA: Insufficient documentation

## 2021-05-09 DIAGNOSIS — I8500 Esophageal varices without bleeding: Secondary | ICD-10-CM | POA: Diagnosis not present

## 2021-05-09 DIAGNOSIS — K219 Gastro-esophageal reflux disease without esophagitis: Secondary | ICD-10-CM | POA: Insufficient documentation

## 2021-05-09 DIAGNOSIS — K648 Other hemorrhoids: Secondary | ICD-10-CM | POA: Diagnosis not present

## 2021-05-09 DIAGNOSIS — I130 Hypertensive heart and chronic kidney disease with heart failure and stage 1 through stage 4 chronic kidney disease, or unspecified chronic kidney disease: Secondary | ICD-10-CM | POA: Diagnosis not present

## 2021-05-09 DIAGNOSIS — Z86010 Personal history of colonic polyps: Secondary | ICD-10-CM

## 2021-05-09 DIAGNOSIS — D509 Iron deficiency anemia, unspecified: Secondary | ICD-10-CM | POA: Diagnosis not present

## 2021-05-09 DIAGNOSIS — K635 Polyp of colon: Secondary | ICD-10-CM

## 2021-05-09 DIAGNOSIS — R195 Other fecal abnormalities: Secondary | ICD-10-CM

## 2021-05-09 DIAGNOSIS — K449 Diaphragmatic hernia without obstruction or gangrene: Secondary | ICD-10-CM

## 2021-05-09 DIAGNOSIS — Z794 Long term (current) use of insulin: Secondary | ICD-10-CM | POA: Insufficient documentation

## 2021-05-09 DIAGNOSIS — Z951 Presence of aortocoronary bypass graft: Secondary | ICD-10-CM | POA: Insufficient documentation

## 2021-05-09 DIAGNOSIS — Z800 Family history of malignant neoplasm of digestive organs: Secondary | ICD-10-CM

## 2021-05-09 DIAGNOSIS — K5730 Diverticulosis of large intestine without perforation or abscess without bleeding: Secondary | ICD-10-CM | POA: Insufficient documentation

## 2021-05-09 DIAGNOSIS — Z825 Family history of asthma and other chronic lower respiratory diseases: Secondary | ICD-10-CM | POA: Insufficient documentation

## 2021-05-09 DIAGNOSIS — Z7901 Long term (current) use of anticoagulants: Secondary | ICD-10-CM | POA: Insufficient documentation

## 2021-05-09 DIAGNOSIS — Z8249 Family history of ischemic heart disease and other diseases of the circulatory system: Secondary | ICD-10-CM | POA: Insufficient documentation

## 2021-05-09 DIAGNOSIS — Z8719 Personal history of other diseases of the digestive system: Secondary | ICD-10-CM

## 2021-05-09 DIAGNOSIS — Z881 Allergy status to other antibiotic agents status: Secondary | ICD-10-CM | POA: Insufficient documentation

## 2021-05-09 DIAGNOSIS — Z87891 Personal history of nicotine dependence: Secondary | ICD-10-CM | POA: Diagnosis not present

## 2021-05-09 HISTORY — PX: COLONOSCOPY WITH PROPOFOL: SHX5780

## 2021-05-09 HISTORY — PX: HOT HEMOSTASIS: SHX5433

## 2021-05-09 HISTORY — PX: ESOPHAGOGASTRODUODENOSCOPY (EGD) WITH PROPOFOL: SHX5813

## 2021-05-09 HISTORY — PX: GI RADIOFREQUENCY ABLATION: SHX6807

## 2021-05-09 HISTORY — PX: POLYPECTOMY: SHX5525

## 2021-05-09 LAB — GLUCOSE, CAPILLARY: Glucose-Capillary: 102 mg/dL — ABNORMAL HIGH (ref 70–99)

## 2021-05-09 SURGERY — ESOPHAGOGASTRODUODENOSCOPY (EGD) WITH PROPOFOL
Anesthesia: Monitor Anesthesia Care

## 2021-05-09 MED ORDER — SODIUM CHLORIDE 0.9 % IV SOLN
INTRAVENOUS | Status: DC
Start: 2021-05-09 — End: 2021-05-09

## 2021-05-09 MED ORDER — PROPOFOL 500 MG/50ML IV EMUL
INTRAVENOUS | Status: DC | PRN
  Administered 2021-05-09: 100 ug/kg/min via INTRAVENOUS

## 2021-05-09 MED ORDER — PROPOFOL 10 MG/ML IV BOLUS
INTRAVENOUS | Status: DC | PRN
  Administered 2021-05-09 (×5): 10 mg via INTRAVENOUS

## 2021-05-09 MED ORDER — PHENYLEPHRINE 40 MCG/ML (10ML) SYRINGE FOR IV PUSH (FOR BLOOD PRESSURE SUPPORT)
PREFILLED_SYRINGE | INTRAVENOUS | Status: DC | PRN
  Administered 2021-05-09 (×3): 80 ug via INTRAVENOUS

## 2021-05-09 MED ORDER — LACTATED RINGERS IV SOLN
INTRAVENOUS | Status: DC
  Administered 2021-05-09: 1000 mL via INTRAVENOUS

## 2021-05-09 MED ORDER — LIDOCAINE HCL (CARDIAC) PF 100 MG/5ML IV SOSY
PREFILLED_SYRINGE | INTRAVENOUS | Status: DC | PRN
  Administered 2021-05-09: 100 mg via INTRAVENOUS

## 2021-05-09 SURGICAL SUPPLY — 25 items

## 2021-05-09 NOTE — Anesthesia Postprocedure Evaluation (Signed)
Anesthesia Post Note  Patient: Lawrence [name]  Procedure(s) Performed: ESOPHAGOGASTRODUODENOSCOPY (EGD) WITH PROPOFOL (N/A ) COLONOSCOPY WITH PROPOFOL (N/A ) GI RADIOFREQUENCY ABLATION POLYPECTOMY HOT HEMOSTASIS (ARGON PLASMA COAGULATION/BICAP) (N/A )     Patient location during evaluation: Endoscopy Anesthesia Type: MAC Level of consciousness: awake and alert Pain management: pain level controlled Vital Signs Assessment: post-procedure vital signs reviewed and stable Respiratory status: spontaneous breathing, nonlabored ventilation, respiratory function stable and patient connected to nasal cannula oxygen Cardiovascular status: blood pressure returned to baseline and stable Postop Assessment: no apparent nausea or vomiting Anesthetic complications: no   No complications documented.  Last Vitals:  Vitals:   05/09/21 1022 05/09/21 1023  BP: (!) 147/76 (!) 147/76  Pulse: 66 62  Resp: 18 18  Temp:    SpO2: 99% 98%    Last Pain:  Vitals:   05/09/21 1023  TempSrc:   PainSc: 0-No pain                 Barnet Glasgow

## 2021-05-09 NOTE — Op Note (Signed)
[facility] Patient Name: Lawrence [name] Procedure Date: 05/09/2021 MRN: [id number] Attending MD: Jackquline Denmark , MD Date of Birth: May 17, 1946 CSN: [id number] Age: 75 Admit Type: Outpatient Procedure:                Colonoscopy Indications:              1. Heme positive stools with IDA. 2. 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 Providers:                Jackquline Denmark, MD, Burtis Junes, RN, Tyrone Apple,                            Technician Referring MD:             Dr. Lovette Cliche. Medicines:                Monitored Anesthesia Care Complications:            No immediate complications. Estimated Blood Loss:     Estimated blood loss: none. Procedure:                Pre-Anesthesia Assessment:                           - Prior to the procedure, a History and Physical                            was performed, and patient medications and                            allergies were reviewed. The patient's tolerance of                            previous anesthesia was also reviewed. The risks                            and benefits of the procedure and the sedation                            options and risks were discussed with the patient.                            All questions were answered, and informed consent                            was obtained. Prior Anticoagulants: The patient has                            taken Eliquis (apixaban), last dose was 2 days                            prior to procedure. ASA Grade Assessment: III - A  patient with severe systemic disease. After                            reviewing the risks and benefits, the patient was                            deemed in satisfactory condition to undergo the                            procedure.                           After obtaining informed consent, the colonoscope                            was passed under direct vision.  Throughout the                            procedure, the patient's blood pressure, pulse, and                            oxygen saturations were monitored continuously. The                            CF-HQ190L ([id number]) Olympus colonoscope was                            introduced through the anus and advanced to the the                            ileocolonic anastomosis. The colonoscopy was                            performed without difficulty. The patient tolerated                            the procedure well. The quality of the bowel                            preparation was good. The neo-terminal ileum,                            ileocolic anastomosis and rectum were photographed. Scope In: 9:26:13 AM Scope Out: 9:51:53 AM Total Procedure Duration: 0 hours 25 minutes 40 seconds  Findings:      Five sessile polyps were found in the proximal transverse colon, mid       ascending colon and distal ascending colon. The polyps were 4 to 8 mm in       size. These polyps were removed with a cold snare. Resection and       retrieval were complete.      Three small angiodysplastic lesions without bleeding were found in the       proximal ascending colon. Coagulation for bleeding prevention using       argon plasma was successful.      A few medium-mouthed diverticula  were found in the sigmoid colon.      Non-bleeding internal hemorrhoids were found during retroflexion. The       hemorrhoids were small.      The neo-terminal ileum appeared normal. The ileocolic anastomosis was       patent.      The exam was otherwise without abnormality on direct and retroflexion       views. Impression:               - Five 4 to 8 mm polyps in the proximal transverse                            colon, in the mid ascending colon and in the distal                            ascending colon, removed with a cold snare.                            Resected and retrieved.                           - Three  non-bleeding colonic angiodysplastic                            lesions. Treated with argon plasma coagulation                            (APC).                           - Mild sigmoid diverticulosis.                           - Patent ileocolic anastomoses. Normal neo-TI. Moderate Sedation:      Not Applicable - Patient had care per Anesthesia. Recommendation:           - Patient has a contact number available for                            emergencies. The signs and symptoms of potential                            delayed complications were discussed with the                            patient. Return to normal activities tomorrow.                            Written discharge instructions were provided to the                            patient.                           - Resume previous diet.                           -  Continue present medications.                           - Resume Eliquis (apixaban) at prior dose in 2 days.                           - Await pathology results.                           - Repeat colonoscopy for surveillance based on                            pathology results.                           - Return to GI clinic in 6 weeks.                           - Check CBC, CMP and PT/INR today. Procedure Code(s):        --- Professional ---                           [phone], 59, Colonoscopy, flexible; with control of                            bleeding, any method                           45385, Colonoscopy, flexible; with removal of                            tumor(s), polyp(s), or other lesion(s) by snare                            technique Diagnosis Code(s):        --- Professional ---                           Z86.010, Personal history of colonic polyps                           K63.5, Polyp of colon                           K55.20, Angiodysplasia of colon without hemorrhage                           K64.8, Other hemorrhoids                            K57.30, Diverticulosis of large intestine without                            perforation or abscess without bleeding CPT copyright 2019 [employer]. All rights reserved. The codes documented in this report are preliminary and upon coder review may  be revised to meet current compliance requirements. Jackquline Denmark, MD  05/09/2021 10:09:28 AM This report has been signed electronically. Number of Addenda: 0

## 2021-05-09 NOTE — Interval H&P Note (Signed)
History and Physical Interval Note:  05/09/2021 7:31 AM  Lawrence [name]  has presented today for surgery, with the diagnosis of Lawrence [name] with hiatal hernia, ischedic bowel disease, heme positive stool, family hx of colon cancer family hx of esophageal cancer.  The various methods of treatment have been discussed with the patient and family. After consideration of risks, benefits and other options for treatment, the patient has consented to  Procedure(s): ESOPHAGOGASTRODUODENOSCOPY (EGD) WITH PROPOFOL (N/A) COLONOSCOPY WITH PROPOFOL (N/A) as a surgical intervention.  The patient's history has been reviewed, patient examined, no change in status, stable for surgery.  I have reviewed the patient's chart and labs.  Questions were answered to the patient's satisfaction.     Lawrence [name]

## 2021-05-09 NOTE — Transfer of Care (Signed)
Immediate Anesthesia Transfer of Care Note  Patient: Lawrence [name]  Procedure(s) Performed: ESOPHAGOGASTRODUODENOSCOPY (EGD) WITH PROPOFOL (N/A ) COLONOSCOPY WITH PROPOFOL (N/A ) GI RADIOFREQUENCY ABLATION POLYPECTOMY HOT HEMOSTASIS (ARGON PLASMA COAGULATION/BICAP) (N/A )  Patient Location: Endoscopy Unit  Anesthesia Type:MAC  Level of Consciousness: drowsy and patient cooperative  Airway & Oxygen Therapy: Patient Spontanous Breathing and Patient connected to face mask oxygen  Post-op Assessment: Report given to RN and Post -op Vital signs reviewed and stable  Post vital signs: Reviewed and stable  Last Vitals:  Vitals Value Taken Time  BP 97/54 05/09/21 0958  Temp    Pulse 64 05/09/21 0959  Resp 14 05/09/21 0959  SpO2 100 % 05/09/21 0959  Vitals shown include unvalidated device data.  Last Pain:  Vitals:   05/09/21 0958  TempSrc:   PainSc: 0-No pain         Complications: No complications documented.

## 2021-05-09 NOTE — Op Note (Signed)
[facility] Patient Name: Lawrence [name] Procedure Date: 05/09/2021 MRN: [id number] Attending MD: Jackquline Denmark , MD Date of Birth: 11-Dec-1946 CSN: [id number] Age: 75 Admit Type: Outpatient Procedure:                Upper GI endoscopy Indications:              Iron deficiency anemia. H/O NASH liver cirrhosis Providers:                Jackquline Denmark, MD, Burtis Junes, RN, Tyrone Apple,                            Technician Referring MD:             Dr Lovette Cliche. Medicines:                Monitored Anesthesia Care Complications:            No immediate complications. Estimated Blood Loss:     Estimated blood loss: none. Procedure:                Pre-Anesthesia Assessment:                           - Prior to the procedure, a History and Physical                            was performed, and patient medications and                            allergies were reviewed. The patient's tolerance of                            previous anesthesia was also reviewed. The risks                            and benefits of the procedure and the sedation                            options and risks were discussed with the patient.                            All questions were answered, and informed consent                            was obtained. Prior Anticoagulants: The patient has                            taken Eliquis (apixaban), last dose was 2 days                            prior to procedure. ASA Grade Assessment: III - A                            patient with severe systemic disease. After  reviewing the risks and benefits, the patient was                            deemed in satisfactory condition to undergo the                            procedure.                           After obtaining informed consent, the endoscope was                            passed under direct vision. Throughout the                            procedure, the patient's  blood pressure, pulse, and                            oxygen saturations were monitored continuously. The                            [id number]-H190 ([id number]) Olympus gastroscope was                            introduced through the mouth, and advanced to the                            second part of duodenum. The upper GI endoscopy was                            accomplished without difficulty. The patient                            tolerated the procedure well. Scope In: Scope Out: Findings:      Grade 0-I minimal diminutive varices were found in the lower third of       the esophagus. Too small for EVL.      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 Focal radiofrequency ablation of Barrett's esophagus       was performed using Barrx TTS 1779, 40 application at 12 J/cm2 with near       complete eradication. Postprocedure photodocumentation was obtained. The       patient had no complications. No fundal varices.      The examined duodenum was normal. Impression:               - Gastric antral vascular ectasia without bleeding.                            Treated with radiofrequency ablation.                           - Grade 0-1 esophageal varices. Too small for EVL Moderate Sedation:      Not Applicable - Patient had care per Anesthesia. Recommendation:           -  Patient has a contact number available for                            emergencies. The signs and symptoms of potential                            delayed complications were discussed with the                            patient. Return to normal activities tomorrow.                            Written discharge instructions were provided to the                            patient.                           - Resume previous diet.                           - Resume Eliquis (apixaban) at prior dose in 48                            hours.                           - The findings  and recommendations were discussed                            with the patient's family.                           - Avoid ibuprofen, naproxen, or other non-steroidal                            anti-inflammatory drugs.                           - Return to GI clinic in 6 weeks.                           - Rpt EGD in 12 weeks. Procedure Code(s):        --- Professional ---                           [phone], Esophagogastroduodenoscopy, flexible,                            transoral; with ablation of tumor(s), polyp(s), or                            other lesion(s) (includes pre- and post-dilation                            and guide wire passage, when performed) Diagnosis Code(s):        ---  Professional ---                           I85.00, Esophageal varices without bleeding                           K31.819, Angiodysplasia of stomach and duodenum                            without bleeding                           D50.9, Iron deficiency anemia, unspecified CPT copyright 2019 American Medical Association. All rights reserved. The codes documented in this report are preliminary and upon coder review may  be revised to meet current compliance requirements. Jackquline Denmark, MD 05/09/2021 9:58:48 AM This report has been signed electronically. Number of Addenda: 0

## 2021-05-09 NOTE — Discharge Instructions (Signed)

## 2021-05-10 LAB — SURGICAL PATHOLOGY

## 2021-05-11 ENCOUNTER — Encounter (HOSPITAL_COMMUNITY): Payer: Self-pay | Admitting: Gastroenterology

## 2021-05-15 DIAGNOSIS — R600 Localized edema: Secondary | ICD-10-CM | POA: Diagnosis not present

## 2021-05-15 DIAGNOSIS — L989 Disorder of the skin and subcutaneous tissue, unspecified: Secondary | ICD-10-CM | POA: Diagnosis not present

## 2021-05-15 DIAGNOSIS — Z6833 Body mass index (BMI) 33.0-33.9, adult: Secondary | ICD-10-CM | POA: Diagnosis not present

## 2021-05-17 ENCOUNTER — Encounter: Payer: Self-pay | Admitting: Gastroenterology

## 2021-05-25 ENCOUNTER — Inpatient Hospital Stay: Payer: Medicare Other

## 2021-05-25 ENCOUNTER — Inpatient Hospital Stay: Payer: Medicare Other | Attending: Oncology | Admitting: Hematology and Oncology

## 2021-05-25 ENCOUNTER — Other Ambulatory Visit: Payer: Self-pay | Admitting: Hematology and Oncology

## 2021-05-25 DIAGNOSIS — D509 Iron deficiency anemia, unspecified: Secondary | ICD-10-CM

## 2021-05-26 ENCOUNTER — Ambulatory Visit (INDEPENDENT_AMBULATORY_CARE_PROVIDER_SITE_OTHER): Payer: Medicare Other

## 2021-05-26 DIAGNOSIS — I495 Sick sinus syndrome: Secondary | ICD-10-CM

## 2021-05-26 LAB — CUP PACEART REMOTE DEVICE CHECK
Battery Remaining Longevity: 102 mo
Battery Remaining Percentage: 95.5 %
Battery Voltage: 2.98 V
Brady Statistic AP VP Percent: 30 %
Brady Statistic AP VS Percent: 63 %
Brady Statistic AS VP Percent: 1 %
Brady Statistic AS VS Percent: 5.9 %
Brady Statistic RA Percent Paced: 78 %
Brady Statistic RV Percent Paced: 30 %
Date Time Interrogation Session: 20220603072337
Implantable Lead Implant Date: 20180830
Implantable Lead Implant Date: 20180830
Implantable Lead Location: 753859
Implantable Lead Location: 753860
Implantable Pulse Generator Implant Date: 20180830
Lead Channel Impedance Value: 360 Ohm
Lead Channel Impedance Value: 390 Ohm
Lead Channel Pacing Threshold Amplitude: 0.625 V
Lead Channel Pacing Threshold Amplitude: 0.75 V
Lead Channel Pacing Threshold Pulse Width: 0.5 ms
Lead Channel Pacing Threshold Pulse Width: 0.5 ms
Lead Channel Sensing Intrinsic Amplitude: 1.3 mV
Lead Channel Sensing Intrinsic Amplitude: 8.8 mV
Lead Channel Setting Pacing Amplitude: 1.625
Lead Channel Setting Pacing Amplitude: 2.5 V
Lead Channel Setting Pacing Pulse Width: 0.5 ms
Lead Channel Setting Sensing Sensitivity: 2 mV
Pulse Gen Model: 2272
Pulse Gen Serial Number: 8939815

## 2021-05-29 DIAGNOSIS — H25811 Combined forms of age-related cataract, right eye: Secondary | ICD-10-CM | POA: Diagnosis not present

## 2021-05-29 DIAGNOSIS — Z01818 Encounter for other preprocedural examination: Secondary | ICD-10-CM | POA: Diagnosis not present

## 2021-05-29 DIAGNOSIS — H353131 Nonexudative age-related macular degeneration, bilateral, early dry stage: Secondary | ICD-10-CM | POA: Diagnosis not present

## 2021-05-29 DIAGNOSIS — E119 Type 2 diabetes mellitus without complications: Secondary | ICD-10-CM | POA: Diagnosis not present

## 2021-06-05 DIAGNOSIS — H25811 Combined forms of age-related cataract, right eye: Secondary | ICD-10-CM | POA: Diagnosis not present

## 2021-06-06 ENCOUNTER — Encounter: Payer: Medicare Other | Admitting: Gastroenterology

## 2021-06-13 NOTE — Progress Notes (Signed)
Remote pacemaker transmission.   

## 2021-07-03 DIAGNOSIS — M25532 Pain in left wrist: Secondary | ICD-10-CM | POA: Diagnosis not present

## 2021-07-03 DIAGNOSIS — M25522 Pain in left elbow: Secondary | ICD-10-CM | POA: Diagnosis not present

## 2021-07-03 DIAGNOSIS — Z6833 Body mass index (BMI) 33.0-33.9, adult: Secondary | ICD-10-CM | POA: Diagnosis not present

## 2021-07-03 DIAGNOSIS — M79642 Pain in left hand: Secondary | ICD-10-CM | POA: Diagnosis not present

## 2021-07-26 DIAGNOSIS — I509 Heart failure, unspecified: Secondary | ICD-10-CM | POA: Diagnosis not present

## 2021-07-26 DIAGNOSIS — K922 Gastrointestinal hemorrhage, unspecified: Secondary | ICD-10-CM | POA: Diagnosis not present

## 2021-07-26 DIAGNOSIS — N189 Chronic kidney disease, unspecified: Secondary | ICD-10-CM | POA: Diagnosis not present

## 2021-07-26 DIAGNOSIS — I129 Hypertensive chronic kidney disease with stage 1 through stage 4 chronic kidney disease, or unspecified chronic kidney disease: Secondary | ICD-10-CM | POA: Diagnosis not present

## 2021-07-26 DIAGNOSIS — E1122 Type 2 diabetes mellitus with diabetic chronic kidney disease: Secondary | ICD-10-CM | POA: Diagnosis not present

## 2021-07-26 DIAGNOSIS — Z7901 Long term (current) use of anticoagulants: Secondary | ICD-10-CM | POA: Diagnosis not present

## 2021-07-26 DIAGNOSIS — N1831 Chronic kidney disease, stage 3a: Secondary | ICD-10-CM | POA: Diagnosis not present

## 2021-07-26 DIAGNOSIS — E559 Vitamin D deficiency, unspecified: Secondary | ICD-10-CM | POA: Diagnosis not present

## 2021-07-26 DIAGNOSIS — K7581 Nonalcoholic steatohepatitis (NASH): Secondary | ICD-10-CM | POA: Diagnosis not present

## 2021-07-26 DIAGNOSIS — D631 Anemia in chronic kidney disease: Secondary | ICD-10-CM | POA: Diagnosis not present

## 2021-07-26 DIAGNOSIS — M109 Gout, unspecified: Secondary | ICD-10-CM | POA: Diagnosis not present

## 2021-07-26 DIAGNOSIS — I480 Paroxysmal atrial fibrillation: Secondary | ICD-10-CM | POA: Diagnosis not present

## 2021-07-26 DIAGNOSIS — N179 Acute kidney failure, unspecified: Secondary | ICD-10-CM | POA: Diagnosis not present

## 2021-07-26 DIAGNOSIS — Z950 Presence of cardiac pacemaker: Secondary | ICD-10-CM | POA: Diagnosis not present

## 2021-07-27 ENCOUNTER — Telehealth: Payer: Self-pay | Admitting: Cardiology

## 2021-07-27 NOTE — Telephone Encounter (Signed)
New Message:    Wife called and said the patient saw his kidney doctor yesterday. The doctor said the patient had gained  over 28 pounds in the last month and half. He said it appeared to be from fluid retention in his upper Torso area.Patient was told to contact Dr [name] and see if he wanted to see him sooner than his appointment which is on 08-30-21?f

## 2021-08-01 DIAGNOSIS — N1831 Chronic kidney disease, stage 3a: Secondary | ICD-10-CM | POA: Diagnosis not present

## 2021-08-16 DIAGNOSIS — L905 Scar conditions and fibrosis of skin: Secondary | ICD-10-CM | POA: Diagnosis not present

## 2021-08-16 DIAGNOSIS — L0109 Other impetigo: Secondary | ICD-10-CM | POA: Diagnosis not present

## 2021-08-16 DIAGNOSIS — Z85828 Personal history of other malignant neoplasm of skin: Secondary | ICD-10-CM | POA: Diagnosis not present

## 2021-08-16 DIAGNOSIS — L570 Actinic keratosis: Secondary | ICD-10-CM | POA: Diagnosis not present

## 2021-08-22 DIAGNOSIS — L905 Scar conditions and fibrosis of skin: Secondary | ICD-10-CM | POA: Diagnosis not present

## 2021-08-22 DIAGNOSIS — D485 Neoplasm of uncertain behavior of skin: Secondary | ICD-10-CM | POA: Diagnosis not present

## 2021-08-22 DIAGNOSIS — Z85828 Personal history of other malignant neoplasm of skin: Secondary | ICD-10-CM | POA: Diagnosis not present

## 2021-08-25 ENCOUNTER — Ambulatory Visit (INDEPENDENT_AMBULATORY_CARE_PROVIDER_SITE_OTHER): Payer: Medicare Other

## 2021-08-25 DIAGNOSIS — I495 Sick sinus syndrome: Secondary | ICD-10-CM | POA: Diagnosis not present

## 2021-08-29 ENCOUNTER — Other Ambulatory Visit: Payer: Self-pay

## 2021-08-29 DIAGNOSIS — K0381 Cracked tooth: Secondary | ICD-10-CM | POA: Insufficient documentation

## 2021-08-29 DIAGNOSIS — K027 Dental root caries: Secondary | ICD-10-CM | POA: Insufficient documentation

## 2021-08-29 DIAGNOSIS — M2550 Pain in unspecified joint: Secondary | ICD-10-CM

## 2021-08-29 HISTORY — DX: Cracked tooth: K03.81

## 2021-08-29 HISTORY — DX: Pain in unspecified joint: M25.50

## 2021-08-29 HISTORY — DX: Dental root caries: K02.7

## 2021-08-29 LAB — CUP PACEART REMOTE DEVICE CHECK
Battery Remaining Longevity: 58 mo
Battery Remaining Percentage: 58 %
Battery Voltage: 2.98 V
Brady Statistic AP VP Percent: 35 %
Brady Statistic AP VS Percent: 58 %
Brady Statistic AS VP Percent: 1.3 %
Brady Statistic AS VS Percent: 5.3 %
Brady Statistic RA Percent Paced: 80 %
Brady Statistic RV Percent Paced: 35 %
Date Time Interrogation Session: 20220902020015
Implantable Lead Implant Date: 20180830
Implantable Lead Implant Date: 20180830
Implantable Lead Location: 753859
Implantable Lead Location: 753860
Implantable Pulse Generator Implant Date: 20180830
Lead Channel Impedance Value: 360 Ohm
Lead Channel Impedance Value: 390 Ohm
Lead Channel Pacing Threshold Amplitude: 0.75 V
Lead Channel Pacing Threshold Amplitude: 0.75 V
Lead Channel Pacing Threshold Pulse Width: 0.5 ms
Lead Channel Pacing Threshold Pulse Width: 0.5 ms
Lead Channel Sensing Intrinsic Amplitude: 1.3 mV
Lead Channel Sensing Intrinsic Amplitude: 8.3 mV
Lead Channel Setting Pacing Amplitude: 1.75 V
Lead Channel Setting Pacing Amplitude: 2.5 V
Lead Channel Setting Pacing Pulse Width: 0.5 ms
Lead Channel Setting Sensing Sensitivity: 2 mV
Pulse Gen Model: 2272
Pulse Gen Serial Number: 8939815

## 2021-08-30 ENCOUNTER — Encounter: Payer: Self-pay | Admitting: Cardiology

## 2021-08-30 ENCOUNTER — Ambulatory Visit (INDEPENDENT_AMBULATORY_CARE_PROVIDER_SITE_OTHER): Payer: Medicare Other | Admitting: Cardiology

## 2021-08-30 ENCOUNTER — Other Ambulatory Visit: Payer: Self-pay

## 2021-08-30 VITALS — BP 120/68 | HR 60 | Ht 72.0 in | Wt 214.8 lb

## 2021-08-30 DIAGNOSIS — K0262 Dental caries on smooth surface penetrating into dentin: Secondary | ICD-10-CM | POA: Insufficient documentation

## 2021-08-30 DIAGNOSIS — I739 Peripheral vascular disease, unspecified: Secondary | ICD-10-CM

## 2021-08-30 DIAGNOSIS — I480 Paroxysmal atrial fibrillation: Secondary | ICD-10-CM

## 2021-08-30 DIAGNOSIS — I5032 Chronic diastolic (congestive) heart failure: Secondary | ICD-10-CM

## 2021-08-30 DIAGNOSIS — I10 Essential (primary) hypertension: Secondary | ICD-10-CM

## 2021-08-30 DIAGNOSIS — G4733 Obstructive sleep apnea (adult) (pediatric): Secondary | ICD-10-CM | POA: Diagnosis not present

## 2021-08-30 HISTORY — DX: Dental caries on smooth surface penetrating into dentin: K02.62

## 2021-08-30 MED ORDER — ISOSORBIDE MONONITRATE ER 30 MG PO TB24: 30.0000 mg | ORAL_TABLET | Freq: Every day | ORAL | 3 refills | Status: AC

## 2021-08-30 NOTE — Patient Instructions (Signed)
Medication Instructions:  Your physician has recommended you make the following change in your medication:  START: Imdur 30 mg take one tablet by mouth daily.  *If you need a refill on your cardiac medications before your next appointment, please call your pharmacy*   Lab Work: Your physician recommends that you return for lab work in: [place] If you have labs (blood work) drawn today and your tests are completely normal, you will receive your results only by: [place] (if you have [place]) OR A paper copy in the mail If you have any lab test that is abnormal or we need to change your treatment, we will call you to review the results.   Testing/Procedures: None   Follow-Up: At [facility], you and your health needs are our priority.  As part of our continuing mission to provide you with exceptional heart care, we have created designated Provider Care Teams.  These Care Teams include your primary Cardiologist (physician) and Advanced Practice Providers (APPs -  Physician Assistants and Nurse Practitioners) who all work together to provide you with the care you need, when you need it.  We recommend signing up for the patient portal called "MyChart".  Sign up information is provided on this After Visit Summary.  MyChart is used to connect with patients for Virtual Visits (Telemedicine).  Patients are able to view lab/test results, encounter notes, upcoming appointments, etc.  Non-urgent messages can be sent to your provider as well.   To learn more about what you can do with MyChart, go to [email or web address].    Your next appointment:   6 week(s)  The format for your next appointment:   In Person  Provider:   Dr. [name]    Other Instructions

## 2021-08-30 NOTE — Progress Notes (Signed)
Cardiology Office Note:    Date:  08/30/2021   ID:  Lawrence [name], DOB [birth year], MRN [id number]  PCP:  [name], MD  Cardiologist:  [name], MD    Referring MD: [name], MD   Chief Complaint  Patient presents with   fluids retention    History of Present Illness:    Lawrence [name] is a 75 y.o. male with past medical history significant for coronary artery disease, status post coronary artery bypass graft years ago, pacemaker present, cardiomyopathy with ejection fraction normalization however latest echocardiogram showed deterioration of ejection fraction to 40 to 45%, diabetes mellitus, essential hypertension, paroxysmal atrial fibrillation, kidney dysfunction. He was referred back to [place] because there was some fluid retention he required more diuretic he increase his Lasix from 40 mg daily to 60 mg every other day he lost significant mount of weight swelling is gone he is back to himself and doing well.  Denies have any chest pain tightness squeezing pressure burning chest no palpitations no dizziness  Past Medical History:  Diagnosis Date   Acute on chronic diastolic CHF (congestive heart failure) ([place]) 01/03/2018   Age-related cataract of both eyes 05/08/2018   Anemia    Anemia, iron deficiency 12/28/2014   Arteriosclerosis of coronary artery 05/26/2019   Formatting of this note might be different from the original. Apr 12, 2017 Entered By: [place] Pugh Comment: April, 2018 echocardiogram = normal   Arthritis    knees   Benign neoplasm of colon 02/15/2021   Dec 20, 1999 Entered By: Everitt Amber Comment: 3/00 colonoscopy [facility]: tubular adenomaMay 01, 2003 Entered By: Everitt Amber Comment: 10/01 colonoscopy: HYPERPLASTIC POLYP   Bradycardia with 31-40 beats per minute 08/21/2017   CAP (community acquired pneumonia) 01/02/2018   Cataracts, bilateral    removed    Cervical spondylosis 05/08/2018   CHF (congestive heart failure),  NYHA class II, chronic, diastolic ([place]) 5/99/3570   Chronic airway obstruction, not elsewhere classified    patient denies this dx on 12/04/19 - (Dr Melvyn Novas dx 02/2013)   Chronic ischemic heart disease, unspecified    Chronic obstructive pulmonary disease ([place]) 01/22/2013   Formatting of this note might be different from the original. Overview:  Followed in Pulmonary clinic/ [facility]/ [place]  - PFTs 03/10/2013 FEV1  2.61 (86%) ratio 76 and no better p B2 with DLCO 66 corrects to 86%  Last Assessment & Plan:  - PFTs 03/10/2013 FEV1  2.61 (86%) ratio 76 and no better p B2 with DLCO 66 corrects to 86%  He does not have significant copd and never will.  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[place]) 05/26/2019   Diverticulosis of colon 02/15/2021   Drug therapy 09/19/2020   Drug-induced acute renal failure ([place]) 02/15/2021   Dyslipidemia 01/09/2016   Dystrophia unguium 02/15/2021   ED (erectile dysfunction) of organic origin 07/05/2014   Enthesopathy of ankle and tarsus 02/15/2021   Erectile dysfunction due to arterial insufficiency 07/20/2015   Esophageal reflux 05/08/2018   Essential hypertension 05/08/2018   Essential hypertension, benign    GI bleed 07/21/2018   Gout 05/08/2018   Hearing loss    wears hearing aids   Heart failure (  [place]) 05/08/2018   Hyperlipidemia, mixed    Mixed hyperlipidemia 05/08/2018   Neck pain 02/15/2021   Nodule of kidney    incidentally found on CT abdomen 11/2012.  Pending Urology evaluation.    Nontoxic multinodular goiter    Nontoxic uninodular goiter 02/15/2021   Obesity    Obstructive sleep apnea (adult) (pediatric) 01/09/2016   Other testicular hypofunction    Pacemaker 09/13/2017   St Jude Device    Paroxysmal atrial  fibrillation ([place]) 11/26/2016   Peripheral arterial occlusive disease ([place]) 02/15/2021   Peripheral vascular disease ([place]) 01/09/2016   Formatting of this note might be different from the original. Right SFA by segmental pressure, conservative management   Peripheral vascular disease, unspecified ([place]) 01/09/2016   Overview:  Right SFA by segmental pressure, conservative management   Permanent atrial fibrillation ([place])    Peyronie's disease 07/20/2015   Pneumonia    x 1   Presence of permanent cardiac pacemaker    ST Jude PPM   Primary osteoarthritis involving multiple joints 05/26/2019   PTSD (post-traumatic stress disorder) 05/08/2018   PUD (peptic ulcer disease)    Pulmonary hypertension ([place]) 01/09/2016   Renal cyst, right 07/05/2014   Renal insufficiency, mild    hx of   Renal lesion 05/25/2014   Secondary polycythemia 02/15/2021   Sensorineural hearing loss (SNHL) of both ears 05/08/2018   Sleep apnea    Does not use CPAP in last 3 months per patient 12/04/19   Small bowel ischemia w pneumatosis s/p ileocectomy 07/21/2018    Stage 2 chronic kidney disease 05/08/2018   Status post coronary artery bypass graft 01/09/2016   Tear of medial cartilage or meniscus of knee, current 02/15/2021   Jan 13, 2002 Entered By: Everitt Amber Comment: Left side. Probable. Patient unable to have MRI.   Testicular hypofunction 05/08/2018   Tinnitus of both ears 05/08/2018   Type 2 diabetes mellitus with diabetic neuropathy ([place]) 05/08/2018    Past Surgical History:  Procedure Laterality Date   ATRIAL FIBRILLATION ABLATION N/A 02/05/2019   Procedure: ATRIAL FIBRILLATION ABLATION;  Surgeon: Constance Haw, MD;  Location: [facility];  Service: Cardiovascular;  Laterality: N/A;   CHOLECYSTECTOMY     COLONOSCOPY  05/11/2015   Colonic polyps statuts post polypectomy. Moderate predominalty sigmoid diverticulosis. Internal hemorrhoids. Tubular adenoma.    COLONOSCOPY WITH PROPOFOL N/A 05/09/2021    Procedure: COLONOSCOPY WITH PROPOFOL;  Surgeon: Jackquline Denmark, MD;  Location: [facility];  Service: Endoscopy;  Laterality: N/A;   CORONARY ARTERY BYPASS GRAFT  2007   x 4   ESOPHAGOGASTRODUODENOSCOPY     10/06/2012:  hiatal hernia, moderate gastritis.  2012: [facility] at [facility]:  one polyp.  Due next 2017   ESOPHAGOGASTRODUODENOSCOPY (EGD) WITH PROPOFOL N/A 05/09/2021   Procedure: ESOPHAGOGASTRODUODENOSCOPY (EGD) WITH PROPOFOL;  Surgeon: Jackquline Denmark, MD;  Location: [facility];  Service: Endoscopy;  Laterality: N/A;   EYE SURGERY     left eye metal removed    EYE SURGERY     bilateral cataracts removed   GI RADIOFREQUENCY ABLATION  05/09/2021   Procedure: GI RADIOFREQUENCY ABLATION;  Surgeon: Jackquline Denmark, MD;  Location: [facility];  Service: Endoscopy;;   HOT HEMOSTASIS N/A 05/09/2021   Procedure: HOT HEMOSTASIS (ARGON PLASMA COAGULATION/BICAP);  Surgeon: Jackquline Denmark, MD;  Location: [facility];  Service: Endoscopy;  Laterality: N/A;   INCISIONAL HERNIA REPAIR N/A 12/08/2019   Procedure: OPEN INCISIONAL HERNIA REPAIR WITH MESH;  Surgeon: Erroll Luna, MD;  Location: [facility];  Service: General;  Laterality: N/A;   INGUINAL HERNIA REPAIR Left 12/08/2019   Procedure: HERNIA REPAIR LEFT  INGUINAL ADULT WITH MESH;  Surgeon: Erroll Luna, MD;  Location: [facility];  Service: General;  Laterality: Left;   KNEE DEBRIDEMENT Right    LAPAROTOMY N/A 07/21/2018   Procedure: EXPLORATORY LAPAROTOMY WITH SMALL BOWEL RESECTION;  Surgeon: Erroll Luna, MD;  Location: [facility];  Service: General;  Laterality: N/A;   PACEMAKER IMPLANT N/A 08/22/2017   Procedure: Pacemaker Implant; St. Jude PPM  Surgeon: Constance Haw, MD;  Location: [facility];  Service: Cardiovascular;  Laterality: N/A;   POLYPECTOMY  05/09/2021   Procedure: POLYPECTOMY;  Surgeon: Jackquline Denmark, MD;  Location: [facility];  Service: Endoscopy;;   SHOULDER SURGERY     left shoulder   TONSILECTOMY, ADENOIDECTOMY,  BILATERAL MYRINGOTOMY AND TUBES     WISDOM TOOTH EXTRACTION      Current Medications: Current Meds  Medication Sig   acetaminophen (TYLENOL) 325 MG tablet Take 650 mg by mouth every 6 (six) hours as needed for moderate pain or headache.   allopurinol (ZYLOPRIM) 100 MG tablet Take 100 mg by mouth daily.   apixaban (ELIQUIS) 5 MG TABS tablet Take 5 mg by mouth 2 (two) times daily.   atorvastatin (LIPITOR) 20 MG tablet Take 1 tablet (20 mg total) by mouth daily.   cholecalciferol (VITAMIN D) 25 MCG (1000 UNIT) tablet Take 1,000 Units by mouth 2 (two) times daily.   cholestyramine (QUESTRAN) 4 g packet Take 4 g by mouth 4 (four) times daily as needed (diarrhea).   colchicine 0.6 MG tablet Take 0.6-1.8 mg by mouth as needed (Gout).   furosemide (LASIX) 40 MG tablet Take 40 mg by mouth daily.    glimepiride (AMARYL) 2 MG tablet Take 2 mg by mouth 2 (two) times daily.   Ipratropium-Albuterol (COMBIVENT) 20-100 MCG/ACT AERS respimat Inhale 1 puff into the lungs every 6 (six) hours as needed for wheezing or shortness of breath.   ipratropium-albuterol (DUONEB) 0.5-2.5 (3) MG/3ML SOLN Take 3 mLs by nebulization every 6 (six) hours as needed (shortness of breath).   lidocaine (LIDODERM) 5 % Place 1-3 patches onto the skin daily as needed (pain). Remove & Discard patch within 12 hours or as directed by MD   metolazone (ZAROXOLYN) 2.5 MG tablet Take 2.5 mg by mouth daily as needed (weight gain of 5 lbs in a day). Take 20 minutes before Furosemide if taking   metoprolol tartrate (LOPRESSOR) 100 MG tablet TAKE 1 TABLET TWICE A DAY ( DOSE INCREASE PER DR CAMNITZ ) (Patient taking differently: Take 100 mg by mouth 2 (two) times daily.)   nitroGLYCERIN (NITROSTAT) 0.4 MG SL tablet Place 0.4 mg under the tongue every 5 (five) minutes as needed for chest pain.    ofloxacin (OCUFLOX) 0.3 % ophthalmic solution Place 1 drop into the right eye daily.   Oxcarbazepine (TRILEPTAL) 300 MG tablet Take 300-600 mg by mouth  as directed. Take 300 mg in the morning and 600 mg at bedtime   OZEMPIC, 0.25 OR 0.5 MG/DOSE, 2 MG/1.5ML SOPN Inject 0.25 mg into the skin every Monday.   pantoprazole (PROTONIX) 40 MG tablet Take 40 mg by mouth daily before breakfast.    prednisoLONE acetate (PRED FORTE) 1 % ophthalmic suspension Place 1 drop into the right eye daily.     Allergies:   Ciprofloxacin, Sulfamethoxazole-trimethoprim, Lisinopril, Naproxen, and Omeprazole   Social History   Socioeconomic History   Marital status: Married  Spouse name: Not on file   Number of children: 5   Years of education: Not on file   Highest education level: Not on file  Occupational History   Occupation: Retired    Comment: Medical illustrator; retired Chiropodist  Tobacco Use   Smoking status: Former    Packs/day: 1.50    Years: 30.00    Pack years: 45.00    Types: Cigarettes    Quit date: 2006    Years since quitting: 16.6   Smokeless tobacco: Former    Types: Nurse, children's Use: Never used  Substance and Sexual Activity   Alcohol use: Yes    Comment: rare   Drug use: No   Sexual activity: Not on file  Other Topics Concern   Not on file  Social History Narrative   ** Merged History Encounter **       Lives with wife in a one story home.  Has 6 children.  Retired Nature conservation officer.  Education: college.   Social Determinants of Health   Financial Resource Strain: Not on file  Food Insecurity: Not on file  Transportation Needs: Not on file  Physical Activity: Not on file  Stress: Not on file  Social Connections: Not on file     Family History: The patient's family history includes Asthma in his sister; Cancer in his father and mother; Colon cancer in his mother; Emphysema in an other family member; Esophageal cancer in his father; Hypertension in his mother; Kidney cancer in his mother; Stroke in his father. ROS:   Please see the history of present illness.    All 14 point review of systems negative  except as described per history of present illness  EKGs/Labs/Other Studies Reviewed:      Recent Labs: 04/24/2021: ALT 36; BUN 47; Creatinine 1.5; Hemoglobin 10.8; Platelets 172; Potassium 4.4; Sodium 136  Recent Lipid Panel No results found for: CHOL, TRIG, HDL, CHOLHDL, VLDL, LDLCALC, LDLDIRECT  Physical Exam:    VS:  BP 120/68 (BP Location: Right Arm, Patient Position: Sitting)   Pulse 60   Ht 6' (1.829 m)   Wt 214 lb 12.8 oz (97.4 kg)   SpO2 92%   BMI 29.13 kg/m     Wt Readings from Last 3 Encounters:  08/30/21 214 lb 12.8 oz (97.4 kg)  05/09/21 210 lb (95.3 kg)  04/24/21 216 lb 9.6 oz (98.2 kg)     GEN:  Well nourished, well developed in no acute distress HEENT: Normal NECK: No JVD; No carotid bruits LYMPHATICS: No lymphadenopathy CARDIAC: RRR, no murmurs, no rubs, no gallops RESPIRATORY:  Clear to auscultation without rales, wheezing or rhonchi  ABDOMEN: Soft, non-tender, non-distended MUSCULOSKELETAL:  No edema; No deformity  SKIN: Warm and dry LOWER EXTREMITIES: no swelling NEUROLOGIC:  Alert and oriented x 3 PSYCHIATRIC:  Normal affect   ASSESSMENT:    1. Paroxysmal atrial fibrillation (HCC)   2. CHF (congestive heart failure), NYHA class II, chronic, diastolic ([place])   3. Peripheral vascular disease, unspecified ([place])   4. Essential hypertension   5. Obstructive sleep apnea syndrome    PLAN:    In order of problems listed above:  Paroxysmal atrial fibrillation.  He is anticoagulated which I will continue. Congestive heart failure he seems to be compensated today again I will check his proBNP as well as Chem-7 today.  We will continue with furosemide 40 mg as well as Zaroxolyn. Cardiomyopathy ejection fraction deteriorated to 45%.  He is not  candidate for ACE inhibitor or ARB Entresto secondary to kidney dysfunction.  He is already on beta-blocker which I will continue.  I will add isosorbide mononitrate 30 mg daily to his medical regiment with intention  to add hydralazine in the future. Essential hypertension blood pressure well controlled Obstructive sleep apnea followed by antimedicine team.  He is CPAP mask.   Medication Adjustments/Labs and Tests Ordered: Current medicines are reviewed at length with the patient today.  Concerns regarding medicines are outlined above.  No orders of the defined types were placed in this encounter.  Medication changes: No orders of the defined types were placed in this encounter.   Signed, Park Liter, MD, [facility] 08/30/2021 1:24 PM    [place]

## 2021-08-30 NOTE — Addendum Note (Signed)
Addended by: Resa Miner I on: 08/30/2021 01:35 PM   Modules accepted: Orders

## 2021-08-31 ENCOUNTER — Telehealth: Payer: Self-pay

## 2021-08-31 DIAGNOSIS — I272 Pulmonary hypertension, unspecified: Secondary | ICD-10-CM

## 2021-08-31 LAB — BASIC METABOLIC PANEL
BUN/Creatinine Ratio: 24 (ref 10–24)
BUN: 48 mg/dL — ABNORMAL HIGH (ref 8–27)
CO2: 24 mmol/L (ref 20–29)
Calcium: 9.2 mg/dL (ref 8.6–10.2)
Chloride: 102 mmol/L (ref 96–106)
Creatinine, Ser: 2 mg/dL — ABNORMAL HIGH (ref 0.76–1.27)
Glucose: 126 mg/dL — ABNORMAL HIGH (ref 65–99)
Potassium: 4.6 mmol/L (ref 3.5–5.2)
Sodium: 142 mmol/L (ref 134–144)
eGFR: 34 mL/min/{1.73_m2} — ABNORMAL LOW (ref >59)

## 2021-08-31 LAB — PRO B NATRIURETIC PEPTIDE: NT-Pro BNP: 9148 pg/mL — ABNORMAL HIGH (ref 0–486)

## 2021-08-31 NOTE — Telephone Encounter (Signed)
Spoke with patient regarding results and recommendation.  Patient verbalizes understanding and is agreeable to plan of care. Advised patient to call back with any issues or concerns.  

## 2021-08-31 NOTE — Telephone Encounter (Signed)
-----   Message from Park Liter, MD sent at 08/31/2021  2:55 PM EDT ----- There is an evidence of decompensated congestive heart failure based on the blood test.  He takes 40 mg of Lasix every single day asked him to take 60 mg twice a week.  Lets check Chem-7 in about 2 weeks

## 2021-09-05 NOTE — Progress Notes (Signed)
Remote pacemaker transmission.   

## 2021-09-14 ENCOUNTER — Other Ambulatory Visit: Payer: Self-pay

## 2021-09-14 DIAGNOSIS — I272 Pulmonary hypertension, unspecified: Secondary | ICD-10-CM

## 2021-09-14 LAB — BASIC METABOLIC PANEL
BUN/Creatinine Ratio: 24 (ref 10–24)
BUN: 45 mg/dL — ABNORMAL HIGH (ref 8–27)
CO2: 21 mmol/L (ref 20–29)
Calcium: 8.9 mg/dL (ref 8.6–10.2)
Chloride: 104 mmol/L (ref 96–106)
Creatinine, Ser: 1.87 mg/dL — ABNORMAL HIGH (ref 0.76–1.27)
Glucose: 106 mg/dL — ABNORMAL HIGH (ref 65–99)
Potassium: 4 mmol/L (ref 3.5–5.2)
Sodium: 141 mmol/L (ref 134–144)
eGFR: 37 mL/min/{1.73_m2} — ABNORMAL LOW (ref >59)

## 2021-09-15 ENCOUNTER — Telehealth: Payer: Self-pay

## 2021-09-15 NOTE — Telephone Encounter (Signed)
-----   Message from Park Liter, MD sent at 09/14/2021 10:15 PM EDT ----- His laboratory is actually improving.  When he was in the office today his wife told me that he is not feeling well.  Please try to call him and ask him how he is doing.

## 2021-09-15 NOTE — Telephone Encounter (Signed)
Spoke with patient regarding results and recommendation.  Patient verbalizes understanding and is agreeable to plan of care. Advised patient to call back with any issues or concerns.    He tells me that he is feeling much better this morning and that he appreciated [place] checking in on him.

## 2021-09-19 ENCOUNTER — Telehealth: Payer: Self-pay

## 2021-09-19 ENCOUNTER — Other Ambulatory Visit: Payer: Self-pay | Admitting: Hematology and Oncology

## 2021-09-19 DIAGNOSIS — D509 Iron deficiency anemia, unspecified: Secondary | ICD-10-CM

## 2021-09-19 NOTE — Telephone Encounter (Signed)
Pt was a no show for June appt. Pt's wife states, "we were busy with eye appts". I scheduled pt for appt for labs & to see Dr Hinton Rao for 08/21/21 @ 830 labs, 9a Dr Hinton Rao.

## 2021-09-20 NOTE — Progress Notes (Signed)
[place]  [address] [place],  [place]  [address] [phone]  Clinic Day:  09/21/2021  Referring physician: [name], MD  This document serves as a record of services personally performed by Hosie Poisson, MD. It was created on their behalf by [facility], a trained medical scribe. The creation of this record is based on the scribe's personal observations and the provider's statements to them.  CHIEF COMPLAINT:  CC:  Longstanding iron deficiency  Current Treatment:  Surveillance   HISTORY OF PRESENT ILLNESS:  Lawrence [name] is a 75 y.o. male with longstanding iron deficiency, who has required repeated iron infusions, averaging about every 6 months.  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  He required multiple frequent IV iron infusions for several years.  We began seeing him in July 2016 and he had just received IV Feraheme.  In July 2019, he was hospitalized with small bowel ischemia.  He underwent surgical resection with ileocecectomy on July 29th.  In retrospect, I feel he was likely losing blood from the ischemic bowel for some time.  He had severe diarrhea after his surgery, but recovered well.  He received IV Feraheme again in August of 2019, but has not required any further infusions after treatment of his bowel ischemia.  He had been in atrial fibrillation since mid October 2019, and had a cardiac ablation.  He has multiple medical comorbidities including the heart failure, chronic kidney disease, gout, atrial fibrillation, for which he has undergone cardioversion and is on apixaban.  He underwent abdominal hernia repair x2 in December with Dr. Brantley Stage.  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  His hemoglobin has mildly decreased from 12.5 to 11.8 in April 2021 and was down to 10.8 when  he was seen in May 2022.  INTERVAL HISTORY:  Lawrence [name] is here for routine follow up and had increasing fatigue 2 days ago. He started taking oral iron supplement BID for the past 2 days, with good improvement. I advised that he continue oral supplement as long as he can tolerate it. He underwent colonoscopy and EGD on May 17th with Dr. Lyndel Safe. 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 Colonoscopy was unremarkable except for 5 sessile polyps measuring 4-8 mm.  Pathology was consistent with tubular adenoma. 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 Hemoglobin is mildly decreased from 10.8 to 10.5, and white count and platelets are normal. Chemistries are remarkable for a BUN of 38, improved, a creatinine of 1.8, previously 1.5, and elevated liver function tests. He is on cholesterol medication. He also has been taking an extra Lasix twice weekly due to fluid overload, and has diuresed about 18 pounds. His  appetite is good, and he has lost 7 pounds since his last visit.  He denies fever, chills or other signs of infection.  He denies nausea, vomiting, bowel issues, or abdominal pain.  He denies sore throat, cough, dyspnea, or chest pain.  REVIEW OF SYSTEMS:  Review of Systems  Constitutional:  Positive for fatigue (severe, but this has improved with oral iron supplement). Negative for appetite change, chills, fever and unexpected weight change.  HENT:  Negative.    Eyes: Negative.   Respiratory: Negative.  Negative for chest tightness, cough, hemoptysis, shortness of breath and wheezing.   Cardiovascular: Negative.  Negative for chest pain, leg swelling and palpitations.  Gastrointestinal: Negative.  Negative for abdominal distention, abdominal pain, blood in stool, constipation, diarrhea, nausea and vomiting.  Endocrine:  Negative.   Genitourinary: Negative.  Negative for difficulty urinating, dysuria, frequency and hematuria.   Musculoskeletal: Negative.  Negative for arthralgias, back pain, flank pain, gait problem and myalgias.  Skin: Negative.   Neurological: Negative.  Negative for dizziness, extremity weakness, gait problem, headaches, light-headedness, numbness, seizures and speech difficulty.  Hematological: Negative.   Psychiatric/Behavioral: Negative.  Negative for depression and sleep disturbance. The patient is not nervous/anxious.   All other systems reviewed and are negative.   VITALS:  Blood pressure (!) 142/68, pulse 64, temperature 97.9 F (36.6 C), temperature source Oral, resp. rate 18, height 6' (1.829 m), weight 206 lb 12.8 oz (93.8 kg), SpO2 97 %.  Wt Readings from Last 3 Encounters:  09/21/21 206 lb 12.8 oz (93.8 kg)  08/30/21 214 lb 12.8 oz (97.4 kg)  05/09/21 210 lb (95.3 kg)    Body mass index is 28.05 kg/m.  Performance status (ECOG): 1 - Symptomatic but completely ambulatory  PHYSICAL EXAM:  Physical Exam Constitutional:      General: He is not in acute distress.    Appearance: Normal appearance. He is normal weight.  HENT:     Head: Normocephalic and atraumatic.  Eyes:     General: No scleral icterus.    Extraocular Movements: Extraocular movements intact.     Conjunctiva/sclera: Conjunctivae normal.     Pupils: Pupils are equal, round, and reactive to light.  Cardiovascular:     Rate and Rhythm: Normal rate and regular rhythm.     Pulses: Normal pulses.     Heart sounds: Normal heart sounds. No murmur heard.   No friction rub. No gallop.  Pulmonary:     Effort: Pulmonary effort is normal. No respiratory distress.     Breath sounds: Normal breath sounds.  Abdominal:     General: Bowel sounds are normal. There is no distension.     Palpations: Abdomen is soft. There is no hepatomegaly, splenomegaly or mass.     Tenderness: There is no abdominal tenderness.      Comments: He has a firm scar of the mid abdomen with a lot of firmness in the periumbilical area. He has a nodular area just to the right and above the umbilicus.  Musculoskeletal:        General: Normal range of motion.     Cervical back: Normal range of motion and neck supple.     Right lower leg: No edema.     Left lower leg: No edema.  Lymphadenopathy:     Cervical: No cervical adenopathy.  Skin:    General: Skin is warm and dry.  Neurological:     General: No focal deficit present.     Mental Status: He is alert and oriented to person, place, and time. Mental status is at baseline.  Psychiatric:        Mood and Affect: Mood normal.        Behavior: Behavior normal.        Thought Content: Thought content normal.        Judgment: Judgment normal.    LABS:   CBC Latest Ref Rng & Units 09/21/2021 04/24/2021 03/24/2021  WBC - 8.9 9.1 9.0  Hemoglobin 13.5 - 17.5 10.5(A) 10.8(A) 10.0(A)  Hematocrit  41 - 53 32(A) 34(A) 31(A)  Platelets 150 - 399 141(A) 172 168   CMP Latest Ref Rng & Units 09/21/2021 09/14/2021 08/30/2021  Glucose 65 - 99 mg/dL - 106(H) 126(H)  BUN 4 - 21 38(A) 45(H) 48(H)  Creatinine 0.6 - 1.3 1.8(A) 1.87(H) 2.00(H)  Sodium 137 - 147 142 141 142  Potassium 3.4 - 5.3 4.2 4.0 4.6  Chloride 99 - 108 103 104 102  CO2 13 - 22 26(A) 21 24  Calcium 8.7 - 10.7 9.3 8.9 9.2  Total Protein 6.5 - 8.1 g/dL - - -  Total Bilirubin 0.3 - 1.2 mg/dL - - -  Alkaline Phos 25 - 125 286(A) - -  AST 14 - 40 94(A) - -  ALT 10 - 40 85(A) - -      Lab Results  Component Value Date   TIBC 301 04/24/2021   TIBC 388 03/24/2021   TIBC 380 03/29/2015   FERRITIN 222 04/24/2021   FERRITIN 31 03/24/2021   FERRITIN 32 03/29/2015   IRONPCTSAT 31 04/24/2021   IRONPCTSAT 10 (L) 03/24/2021   IRONPCTSAT 13 (L) 03/29/2015   No results found for: LDH   STUDIES:  CUP PACEART REMOTE DEVICE CHECK  Result Date: 08/29/2021 Scheduled remote reviewed. Normal device function.  Next remote 91 days.  LR     Allergies:  Allergies  Allergen Reactions   Ciprofloxacin Other (See Comments)    Atrial fib,  renal failure   Sulfamethoxazole-Trimethoprim Other (See Comments)    Atrial fib,  renal failure, potassium level spiked    Lisinopril Nausea And Vomiting and Other (See Comments)    GI Upset   Naproxen Nausea And Vomiting and Other (See Comments)    GI Upset   Omeprazole     Unknown reaction    Current Medications: Current Outpatient Medications  Medication Sig Dispense Refill   acetaminophen (TYLENOL) 325 MG tablet Take 650 mg by mouth every 6 (six) hours as needed for moderate pain or headache.     allopurinol (ZYLOPRIM) 100 MG tablet Take 100 mg by mouth daily.     apixaban (ELIQUIS) 5 MG TABS tablet Take 5 mg by mouth 2 (two) times daily.     atorvastatin (LIPITOR) 20 MG tablet Take 1 tablet (20 mg total) by mouth daily. 90 tablet 3   cholecalciferol (VITAMIN D) 25 MCG (1000 UNIT) tablet Take 1,000 Units by mouth 2 (two) times daily.     cholestyramine (QUESTRAN) 4 g packet Take 4 g by mouth 4 (four) times daily as needed (diarrhea).     colchicine 0.6 MG tablet Take 0.6-1.8 mg by mouth as needed (Gout).     furosemide (LASIX) 40 MG tablet Take 40 mg by mouth daily. On Monday and Friday take 60 mg by mouth daily.      glimepiride (AMARYL) 2 MG tablet Take 2 mg by mouth 2 (two) times daily.     Ipratropium-Albuterol (COMBIVENT) 20-100 MCG/ACT AERS respimat Inhale 1 puff into the lungs every 6 (six) hours as needed for wheezing or shortness of breath.     ipratropium-albuterol (DUONEB) 0.5-2.5 (3) MG/3ML SOLN Take 3 mLs by nebulization every 6 (six) hours as needed (shortness of breath).     isosorbide mononitrate (IMDUR) 30 MG 24 hr tablet Take 1 tablet (30 mg total) by mouth daily. 90 tablet 3   lidocaine (LIDODERM) 5 % Place 1-3 patches onto the skin daily as needed (pain). Remove & Discard patch within 12 hours or as directed  by MD     metolazone (ZAROXOLYN) 2.5 MG tablet  Take 2.5 mg by mouth daily as needed (weight gain of 5 lbs in a day). Take 20 minutes before Furosemide if taking     metoprolol tartrate (LOPRESSOR) 100 MG tablet TAKE 1 TABLET TWICE A DAY ( DOSE INCREASE PER DR CAMNITZ ) (Patient taking differently: Take 100 mg by mouth 2 (two) times daily.) 180 tablet 2   nitroGLYCERIN (NITROSTAT) 0.4 MG SL tablet Place 0.4 mg under the tongue every 5 (five) minutes as needed for chest pain.      ofloxacin (OCUFLOX) 0.3 % ophthalmic solution Place 1 drop into the right eye daily.     Oxcarbazepine (TRILEPTAL) 300 MG tablet Take 300-600 mg by mouth as directed. Take 300 mg in the morning and 600 mg at bedtime     OZEMPIC, 0.25 OR 0.5 MG/DOSE, 2 MG/1.5ML SOPN Inject 0.25 mg into the skin every Monday.     pantoprazole (PROTONIX) 40 MG tablet Take 40 mg by mouth daily before breakfast.      prednisoLONE acetate (PRED FORTE) 1 % ophthalmic suspension Place 1 drop into the right eye daily.     No current facility-administered medications for this visit.     ASSESSMENT & PLAN:   Assessment:   1.  Chronic iron deficiency in a patient with many comorbidities who required IV iron on a regular basis for over 2 years.  Apparently a lot of his chronic blood loss was from the ischemic bowel which has been resected in 2019.  He last received IV iron in April 2022, and recently has been taking oral supplement BID with good improvement. If needed, we will repeat iron infusion.    2. Chronic kidney disease, mildly worse. This may be contributing to the anemia.  3. Chronic recurrent GI blood loss. He did have heme + stool cultures. He underwent GI workup with Dr. Lyndel Safe in May. 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 Colonoscopy was unremarkable except for 5 sessile polyps measuring 4-8 mm, pathology was consistent with tubular adenoma, and three non-bleeding colonic  angiodysplastic lesions were also seen and treated with argon plasma coagulation (APC).  4. Congestive heart failure, managed by Dr. [name].  Plan: We will evaluate his iron levels today and call him with the results. I will also add a TSH in view of his recent fatigue. I advised that he continue oral iron supplement BID as he tolerates it. He knows he need to become more active. We will plan to see him back in 3-4 month with CBC, CMP and iron studies for repeat evaluation. Both he and his wife verbalize understanding of and agreement to the plans discussed today. They know to call the office should any new questions or concerns arise.   I provided 20 minutes of face-to-face time during this this encounter and > 50% was spent counseling as documented under my assessment and plan.    Derwood Kaplan, MD [facility] [address] [place] [address] Dept: [phone] Dept Fax: [phone]    I, Rita Ohara, am acting as scribe for Derwood Kaplan, MD  I have reviewed this report as typed by the medical scribe, and it is complete and accurate.

## 2021-09-21 ENCOUNTER — Encounter: Payer: Self-pay | Admitting: Oncology

## 2021-09-21 ENCOUNTER — Inpatient Hospital Stay: Payer: Medicare Other | Attending: Oncology | Admitting: Oncology

## 2021-09-21 ENCOUNTER — Other Ambulatory Visit: Payer: Self-pay | Admitting: Hematology and Oncology

## 2021-09-21 ENCOUNTER — Other Ambulatory Visit: Payer: Self-pay

## 2021-09-21 ENCOUNTER — Other Ambulatory Visit: Payer: Self-pay | Admitting: Oncology

## 2021-09-21 ENCOUNTER — Inpatient Hospital Stay: Payer: Medicare Other

## 2021-09-21 ENCOUNTER — Telehealth: Payer: Self-pay | Admitting: Oncology

## 2021-09-21 VITALS — BP 142/68 | HR 64 | Temp 97.9°F | Resp 18 | Ht 72.0 in | Wt 206.8 lb

## 2021-09-21 DIAGNOSIS — Z79899 Other long term (current) drug therapy: Secondary | ICD-10-CM | POA: Diagnosis not present

## 2021-09-21 DIAGNOSIS — D500 Iron deficiency anemia secondary to blood loss (chronic): Secondary | ICD-10-CM

## 2021-09-21 DIAGNOSIS — D509 Iron deficiency anemia, unspecified: Secondary | ICD-10-CM

## 2021-09-21 DIAGNOSIS — I4891 Unspecified atrial fibrillation: Secondary | ICD-10-CM | POA: Diagnosis not present

## 2021-09-21 DIAGNOSIS — R5382 Chronic fatigue, unspecified: Secondary | ICD-10-CM

## 2021-09-21 DIAGNOSIS — D649 Anemia, unspecified: Secondary | ICD-10-CM | POA: Diagnosis not present

## 2021-09-21 DIAGNOSIS — Z7984 Long term (current) use of oral hypoglycemic drugs: Secondary | ICD-10-CM | POA: Insufficient documentation

## 2021-09-21 DIAGNOSIS — Z7901 Long term (current) use of anticoagulants: Secondary | ICD-10-CM | POA: Diagnosis not present

## 2021-09-21 LAB — TSH: TSH: 3.218 u[IU]/mL (ref 0.350–4.500)

## 2021-09-21 LAB — IRON AND TIBC
Iron: 58 ug/dL (ref 45–182)
Saturation Ratios: 18 % (ref 17.9–39.5)
TIBC: 315 ug/dL (ref 250–450)
UIBC: 257 ug/dL

## 2021-09-21 LAB — BASIC METABOLIC PANEL
BUN: 38 — AB (ref 4–21)
CO2: 26 — AB (ref 13–22)
Chloride: 103 (ref 99–108)
Creatinine: 1.8 — AB (ref 0.6–1.3)
Glucose: 114
Potassium: 4.2 (ref 3.4–5.3)
Sodium: 142 (ref 137–147)

## 2021-09-21 LAB — CBC
MCV: 100 — AB (ref 80–94)
RBC: 3.21 — AB (ref 3.87–5.11)

## 2021-09-21 LAB — HEPATIC FUNCTION PANEL
ALT: 85 — AB (ref 10–40)
AST: 94 — AB (ref 14–40)
Alkaline Phosphatase: 286 — AB (ref 25–125)
Bilirubin, Total: 0.6

## 2021-09-21 LAB — CBC AND DIFFERENTIAL
HCT: 32 — AB (ref 41–53)
Hemoglobin: 10.5 — AB (ref 13.5–17.5)
Neutrophils Absolute: 6.59
Platelets: 141 — AB (ref 150–399)
WBC: 8.9

## 2021-09-21 LAB — COMPREHENSIVE METABOLIC PANEL
Albumin: 4.1 (ref 3.5–5.0)
Calcium: 9.3 (ref 8.7–10.7)

## 2021-09-21 LAB — FERRITIN: Ferritin: 101 ng/mL (ref 24–336)

## 2021-09-22 LAB — FOLATE RBC
Folate, Hemolysate: 310 ng/mL
Folate, RBC: 948 ng/mL (ref >498)
Hematocrit: 32.7 % — ABNORMAL LOW (ref 37.5–51.0)

## 2021-09-27 ENCOUNTER — Encounter: Payer: Self-pay | Admitting: Oncology

## 2021-09-28 ENCOUNTER — Other Ambulatory Visit: Payer: Self-pay | Admitting: Oncology

## 2021-09-28 DIAGNOSIS — D500 Iron deficiency anemia secondary to blood loss (chronic): Secondary | ICD-10-CM

## 2021-09-28 NOTE — Telephone Encounter (Signed)
-----   Message from Derwood Kaplan, MD sent at 09/27/2021  7:28 PM EDT ----- Regarding: call pt/wife Tell him thyroid normal

## 2021-09-28 NOTE — Telephone Encounter (Signed)
Patient was taking iron twice a day per wife. On Monday 09/25/21, he got sick on his stomach right after taking it and has not taken it since Monday 09/25/21. Wife was waiting to know what she needs to do regarding this. Also wife states patient is fatigued and staying tired more.

## 2021-10-03 ENCOUNTER — Other Ambulatory Visit: Payer: Medicare Other

## 2021-10-08 ENCOUNTER — Other Ambulatory Visit: Payer: Self-pay | Admitting: Cardiology

## 2021-10-17 ENCOUNTER — Other Ambulatory Visit: Payer: Self-pay

## 2021-10-17 ENCOUNTER — Encounter: Payer: Self-pay | Admitting: Cardiology

## 2021-10-17 ENCOUNTER — Telehealth: Payer: Self-pay | Admitting: *Deleted

## 2021-10-17 ENCOUNTER — Ambulatory Visit (INDEPENDENT_AMBULATORY_CARE_PROVIDER_SITE_OTHER): Payer: Medicare Other | Admitting: Cardiology

## 2021-10-17 VITALS — BP 110/54 | HR 73 | Ht 71.0 in | Wt 223.6 lb

## 2021-10-17 DIAGNOSIS — I272 Pulmonary hypertension, unspecified: Secondary | ICD-10-CM | POA: Diagnosis not present

## 2021-10-17 DIAGNOSIS — I480 Paroxysmal atrial fibrillation: Secondary | ICD-10-CM | POA: Diagnosis not present

## 2021-10-17 DIAGNOSIS — R410 Disorientation, unspecified: Secondary | ICD-10-CM | POA: Diagnosis not present

## 2021-10-17 DIAGNOSIS — I5032 Chronic diastolic (congestive) heart failure: Secondary | ICD-10-CM | POA: Diagnosis not present

## 2021-10-17 DIAGNOSIS — G459 Transient cerebral ischemic attack, unspecified: Secondary | ICD-10-CM | POA: Diagnosis not present

## 2021-10-17 DIAGNOSIS — I739 Peripheral vascular disease, unspecified: Secondary | ICD-10-CM | POA: Diagnosis not present

## 2021-10-17 HISTORY — DX: Transient cerebral ischemic attack, unspecified: G45.9

## 2021-10-17 NOTE — Telephone Encounter (Signed)
Left message for pt to call us back. Had a CT of Head at [facility] and results were all normal.

## 2021-10-17 NOTE — Progress Notes (Signed)
Cardiology Office Note:    Date:  10/17/2021   ID:  Lawrence [name], DOB March 04, 1946, MRN [id number]  PCP:  [name], MD  Cardiologist:  [name], MD    Referring MD: [name], MD   Chief Complaint  Patient presents with   Multiple concerns    History of Present Illness:    Lawrence [name] is a 75 y.o. male with very complex past medical history that is significant for coronary artery disease, status post coronary bypass graft many years ago, pacemaker present which is dual-chamber, cardiomyopathy, latest ejection fraction 40 to 45%, diabetes type 2, essential hypertension, paroxysmal atrial fibrillation, kidney dysfunction. He comes today to frequent office visit because of decompensated congestive heart failure.  We did notice latest decompensation of his heart failure as well as decrease in left ventricle ejection fraction.  He is wife who comes like always with him described 2 episodes one on Saturday when he for a while was confused and at times was not able to answer questions she actually recorded him that sensation lasted for about half an hour she called EMS EMS came measured his blood pressure sugar everything looks good and he completely normalized then he had similar episodes yesterday.  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  He noted to also increase in weight by 17 pounds.  He did have some swelling of lower extremities but that looks better.  Past Medical History:  Diagnosis Date   Acute on chronic diastolic CHF (congestive heart failure) ([place]) 01/03/2018   Age-related cataract of both eyes 05/08/2018   Anemia    Anemia, iron deficiency 12/28/2014   Arteriosclerosis of coronary artery 05/26/2019   Formatting of this note might be different  from the original. Apr 12, 2017 Entered By: [place] Pugh Comment: April, 2018 echocardiogram = normal   Arthritis    knees   Benign neoplasm of colon 02/15/2021   Dec 20, 1999 Entered By: Everitt Amber Comment: 3/00 colonoscopy [facility]: tubular adenomaMay 01, 2003 Entered By: Everitt Amber Comment: 10/01 colonoscopy: HYPERPLASTIC POLYP   Bradycardia with 31-40 beats per minute 08/21/2017   CAP (community acquired pneumonia) 01/02/2018   Cataracts, bilateral    removed    Cervical spondylosis 05/08/2018   CHF (congestive heart failure), NYHA class II, chronic, diastolic ([place]) 6/76/7209   Chronic airway obstruction, not elsewhere classified    patient denies this dx on 12/04/19 - (Dr Melvyn Novas dx 02/2013)   Chronic ischemic heart disease, unspecified    Chronic obstructive pulmonary disease ([place]) 01/22/2013   Formatting of this note might be different from the original. Overview:  Followed in Pulmonary clinic/  Healthcare/ [facility]  - PFTs 03/10/2013 FEV1  2.61 (86%) ratio 76 and no better p B2 with DLCO 66 corrects to 86%  Last Assessment & Plan:  - PFTs 03/10/2013 FEV1  2.61 (86%) ratio 76 and no better p B2 with DLCO 66 corrects to 86%  He does not have significant copd and never will.  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/26/2019   Diverticulosis of colon 02/15/2021   Drug therapy 09/19/2020   Drug-induced acute renal failure ([place]) 02/15/2021   Dyslipidemia 01/09/2016   Dystrophia unguium 02/15/2021   ED (erectile dysfunction) of organic origin 07/05/2014   Enthesopathy of ankle and tarsus 02/15/2021   Erectile dysfunction due to arterial insufficiency 07/20/2015   Esophageal reflux 05/08/2018   Essential hypertension 05/08/2018    Essential hypertension, benign    GI bleed 07/21/2018   Gout 05/08/2018   Hearing loss    wears hearing aids   Heart failure ([place]) 05/08/2018   Hyperlipidemia, mixed    Mixed hyperlipidemia 05/08/2018   Neck pain 02/15/2021   Nodule of kidney    incidentally found on CT abdomen 11/2012.  Pending Urology evaluation.    Nontoxic multinodular goiter    Nontoxic uninodular goiter 02/15/2021   Obesity    Obstructive sleep apnea (adult) (pediatric) 01/09/2016   Other testicular hypofunction    Pacemaker 09/13/2017   St Jude Device    Paroxysmal atrial fibrillation ([place]) 11/26/2016   Peripheral arterial occlusive disease ([place]) 02/15/2021   Peripheral vascular disease ([place]) 01/09/2016   Formatting of this note might be different from the original. Right SFA by segmental pressure, conservative management   Peripheral vascular disease, unspecified ([place]) 01/09/2016   Overview:  Right SFA by segmental pressure, conservative management   Permanent atrial fibrillation ([place])    Peyronie's disease 07/20/2015   Pneumonia    x 1   Presence of permanent cardiac pacemaker    ST Jude PPM   Primary osteoarthritis involving multiple joints 05/26/2019   PTSD (post-traumatic stress disorder) 05/08/2018   PUD (peptic ulcer disease)    Pulmonary hypertension ([place]) 01/09/2016   Renal cyst, right 07/05/2014   Renal insufficiency, mild    hx of   Renal lesion 05/25/2014   Secondary polycythemia 02/15/2021   Sensorineural hearing loss (SNHL) of both ears 05/08/2018   Sleep apnea    Does not use CPAP in last 3 months per patient 12/04/19   Small bowel ischemia w pneumatosis s/p ileocectomy 07/21/2018    Stage 2 chronic kidney disease 05/08/2018   Status post coronary artery bypass graft 01/09/2016   Tear of medial cartilage or meniscus of knee, current 02/15/2021   Jan 13, 2002 Entered By: Everitt Amber Comment: Left side. Probable. Patient unable to have MRI.   Testicular hypofunction 05/08/2018   Tinnitus of both ears  05/08/2018   Type 2 diabetes mellitus with diabetic neuropathy ([place]) 05/08/2018    Past Surgical History:  Procedure Laterality Date   ATRIAL FIBRILLATION ABLATION N/A 02/05/2019   Procedure: ATRIAL FIBRILLATION ABLATION;  Surgeon: Constance Haw, MD;  Location: [facility];  Service: Cardiovascular;  Laterality: N/A;   CHOLECYSTECTOMY     COLONOSCOPY  05/11/2015   Colonic polyps statuts post polypectomy. Moderate predominalty sigmoid diverticulosis. Internal hemorrhoids. Tubular adenoma.    COLONOSCOPY WITH PROPOFOL N/A 05/09/2021   Procedure: COLONOSCOPY WITH PROPOFOL;  Surgeon: Jackquline Denmark, MD;  Location: [facility];  Service: Endoscopy;  Laterality: N/A;   CORONARY ARTERY BYPASS GRAFT  2007   x 4   ESOPHAGOGASTRODUODENOSCOPY     10/06/2012:  hiatal hernia, moderate gastritis.  2012: [facility]:  one polyp.  Due next 2017   ESOPHAGOGASTRODUODENOSCOPY (EGD) WITH PROPOFOL N/A 05/09/2021   Procedure: ESOPHAGOGASTRODUODENOSCOPY (EGD) WITH PROPOFOL;  Surgeon: Jackquline Denmark, MD;  Location: [facility];  Service:  Endoscopy;  Laterality: N/A;   EYE SURGERY     left eye metal removed    EYE SURGERY     bilateral cataracts removed   GI RADIOFREQUENCY ABLATION  05/09/2021   Procedure: GI RADIOFREQUENCY ABLATION;  Surgeon: Jackquline Denmark, MD;  Location: [facility];  Service: Endoscopy;;   HOT HEMOSTASIS N/A 05/09/2021   Procedure: HOT HEMOSTASIS (ARGON PLASMA COAGULATION/BICAP);  Surgeon: Jackquline Denmark, MD;  Location: [facility];  Service: Endoscopy;  Laterality: N/A;   INCISIONAL HERNIA REPAIR N/A 12/08/2019   Procedure: OPEN INCISIONAL HERNIA REPAIR WITH MESH;  Surgeon: Erroll Luna, MD;  Location: [facility];  Service: General;  Laterality: N/A;   INGUINAL HERNIA REPAIR Left 12/08/2019   Procedure: HERNIA REPAIR LEFT  INGUINAL ADULT WITH MESH;  Surgeon: Erroll Luna, MD;  Location: [facility];  Service: General;  Laterality: Left;   KNEE DEBRIDEMENT Right    LAPAROTOMY N/A  07/21/2018   Procedure: EXPLORATORY LAPAROTOMY WITH SMALL BOWEL RESECTION;  Surgeon: Erroll Luna, MD;  Location: [facility];  Service: General;  Laterality: N/A;   PACEMAKER IMPLANT N/A 08/22/2017   Procedure: Pacemaker Implant; St. Jude PPM  Surgeon: Constance Haw, MD;  Location: [place] [facility];  Service: Cardiovascular;  Laterality: N/A;   POLYPECTOMY  05/09/2021   Procedure: POLYPECTOMY;  Surgeon: Jackquline Denmark, MD;  Location: [facility];  Service: Endoscopy;;   SHOULDER SURGERY     left shoulder   TONSILECTOMY, ADENOIDECTOMY, BILATERAL MYRINGOTOMY AND TUBES     WISDOM TOOTH EXTRACTION      Current Medications: Current Meds  Medication Sig   acetaminophen (TYLENOL) 325 MG tablet Take 650 mg by mouth every 6 (six) hours as needed for moderate pain or headache.   allopurinol (ZYLOPRIM) 100 MG tablet Take 100 mg by mouth daily.   apixaban (ELIQUIS) 5 MG TABS tablet Take 5 mg by mouth 2 (two) times daily.   atorvastatin (LIPITOR) 20 MG tablet Take 1 tablet (20 mg total) by mouth daily.   cholecalciferol (VITAMIN D) 25 MCG (1000 UNIT) tablet Take 1,000 Units by mouth 2 (two) times daily.   cholestyramine (QUESTRAN) 4 g packet Take 4 g by mouth 4 (four) times daily as needed (diarrhea).   colchicine 0.6 MG tablet Take 0.6-1.8 mg by mouth as needed (Gout).   furosemide (LASIX) 40 MG tablet Take 40 mg by mouth daily. On Monday and Friday take 60 mg by mouth daily.    glimepiride (AMARYL) 2 MG tablet Take 2 mg by mouth 2 (two) times daily.   Ipratropium-Albuterol (COMBIVENT) 20-100 MCG/ACT AERS respimat Inhale 1 puff into the lungs every 6 (six) hours as needed for wheezing or shortness of breath.   ipratropium-albuterol (DUONEB) 0.5-2.5 (3) MG/3ML SOLN Take 3 mLs by nebulization every 6 (six) hours as needed (shortness of breath).   isosorbide mononitrate (IMDUR) 30 MG 24 hr tablet Take 1 tablet (30 mg total) by mouth daily.   lidocaine (LIDODERM) 5 % Place 1-3 patches onto the skin  daily as needed (pain). Remove & Discard patch within 12 hours or as directed by MD   metolazone (ZAROXOLYN) 2.5 MG tablet Take 2.5 mg by mouth daily as needed (weight gain of 5 lbs in a day). Take 20 minutes before Furosemide if taking   metoprolol tartrate (LOPRESSOR) 100 MG tablet Take 1 tablet (100 mg total) by mouth 2 (two) times daily.   nitroGLYCERIN (NITROSTAT) 0.4 MG SL tablet Place 0.4 mg under the tongue every 5 (five) minutes as needed for  chest pain.    ofloxacin (OCUFLOX) 0.3 % ophthalmic solution Place 1 drop into the right eye daily.   Oxcarbazepine (TRILEPTAL) 300 MG tablet Take 300-600 mg by mouth as directed. Take 300 mg in the morning and 600 mg at bedtime   OZEMPIC, 0.25 OR 0.5 MG/DOSE, 2 MG/1.5ML SOPN Inject 0.25 mg into the skin every Monday.   pantoprazole (PROTONIX) 40 MG tablet Take 40 mg by mouth daily before breakfast.    prednisoLONE acetate (PRED FORTE) 1 % ophthalmic suspension Place 1 drop into the right eye daily.     Allergies:   Ciprofloxacin, Sulfamethoxazole-trimethoprim, Lisinopril, Naproxen, and Omeprazole   Social History   Socioeconomic History   Marital status: Married    Spouse name: Not on file   Number of children: 5   Years of education: Not on file   Highest education level: Not on file  Occupational History   Occupation: Retired    Comment: Medical illustrator; retired Chiropodist  Tobacco Use   Smoking status: Former    Packs/day: 1.50    Years: 30.00    Pack years: 45.00    Types: Cigarettes    Quit date: 2006    Years since quitting: 16.8   Smokeless tobacco: Former    Types: Nurse, children's Use: Never used  Substance and Sexual Activity   Alcohol use: Yes    Comment: rare   Drug use: No   Sexual activity: Not on file  Other Topics Concern   Not on file  Social History Narrative   ** Merged History Encounter **       Lives with wife in a one story home.  Has 6 children.  Retired Nature conservation officer.  Education:  college.   Social Determinants of Health   Financial Resource Strain: Not on file  Food Insecurity: Not on file  Transportation Needs: Not on file  Physical Activity: Not on file  Stress: Not on file  Social Connections: Not on file     Family History: The patient's family history includes Asthma in his sister; Cancer in his father and mother; Colon cancer in his mother; Emphysema in an other family member; Esophageal cancer in his father; Hypertension in his mother; Kidney cancer in his mother; Stroke in his father. ROS:   Please see the history of present illness.    All 14 point review of systems negative except as described per history of present illness  EKGs/Labs/Other Studies Reviewed:      Recent Labs: 08/30/2021: NT-Pro BNP 9,148 09/21/2021: ALT 85; BUN 38; Creatinine 1.8; Hemoglobin 10.5; Platelets 141; Potassium 4.2; Sodium 142; TSH 3.218  Recent Lipid Panel No results found for: CHOL, TRIG, HDL, CHOLHDL, VLDL, LDLCALC, LDLDIRECT  Physical Exam:    VS:  BP (!) 110/54 (BP Location: Left Arm, Patient Position: Sitting)   Pulse 73   Ht 5' 11"  (1.803 m)   Wt 223 lb 9.6 oz (101.4 kg)   SpO2 99%   BMI 31.19 kg/m     Wt Readings from Last 3 Encounters:  10/17/21 223 lb 9.6 oz (101.4 kg)  09/21/21 206 lb 12.8 oz (93.8 kg)  08/30/21 214 lb 12.8 oz (97.4 kg)     GEN:  Well nourished, well developed in no acute distress HEENT: Normal NECK: No JVD; No carotid bruits LYMPHATICS: No lymphadenopathy CARDIAC: RRR, no murmurs, no rubs, no gallops RESPIRATORY:  Clear to auscultation without rales, wheezing or rhonchi  ABDOMEN: Soft, non-tender, non-distended MUSCULOSKELETAL:  No edema; No deformity  SKIN: Warm and dry LOWER EXTREMITIES: 1+ swelling NEUROLOGIC:  Alert and oriented x 3 PSYCHIATRIC:  Normal affect   ASSESSMENT:    1. CHF (congestive heart failure), NYHA class II, chronic, diastolic ([place])   2. TIA (transient ischemic attack)   3. Paroxysmal atrial  fibrillation (HCC)   4. Peripheral vascular disease, unspecified ([place])   5. Pulmonary hypertension (HCC)    PLAN:    In order of problems listed above:  Symptomatology suggesting TIA.  I will ask him to have CT of head with no contrast today.  I will check his CBC complete metabolic panel.  proBNP will be checked as well.  I asked his wife to give Zaroxolyn every single day for 4 days. Congestive heart failure.  Mildly decompensated on the physical exam however weight is up by 17 pounds.  Plan is as described above. Paroxysmal atrial fibrillation, his EKG today showed AV pacing, it looks like he is maintaining sinus rhythm.  He is on Eliquis.   Medication Adjustments/Labs and Tests Ordered: Current medicines are reviewed at length with the patient today.  Concerns regarding medicines are outlined above.  No orders of the defined types were placed in this encounter.  Medication changes: No orders of the defined types were placed in this encounter.   Signed, Park Liter, MD, [facility] 10/17/2021 1:06 PM    [facility]

## 2021-10-17 NOTE — Patient Instructions (Signed)
Medication Instructions:  Your physician has recommended you make the following change in your medication:  INCREASE: Zaroxolyn to every day for 4 days  *If you need a refill on your cardiac medications before your next appointment, please call your pharmacy*   Lab Work: Your physician recommends that you return for lab work today: cmp, cbc, pro bnp  If you have labs (blood work) drawn today and your tests are completely normal, you will receive your results only by: [place] (if you have MyChart) OR A paper copy in the mail If you have any lab test that is abnormal or we need to change your treatment, we will call you to review the results.   Testing/Procedures: You will have a head ct and [place].    Follow-Up: At [facility], you and your health needs are our priority.  As part of our continuing mission to provide you with exceptional heart care, we have created designated Provider Care Teams.  These Care Teams include your primary Cardiologist (physician) and Advanced Practice Providers (APPs -  Physician Assistants and Nurse Practitioners) who all work together to provide you with the care you need, when you need it.  We recommend signing up for the patient portal called "MyChart".  Sign up information is provided on this After Visit Summary.  MyChart is used to connect with patients for Virtual Visits (Telemedicine).  Patients are able to view lab/test results, encounter notes, upcoming appointments, etc.  Non-urgent messages can be sent to your provider as well.   To learn more about what you can do with MyChart, go to [email or web address].    Your next appointment:   2 week(s)  The format for your next appointment:   In Person  Provider:   [name], MD   Other Instructions

## 2021-10-18 LAB — COMPREHENSIVE METABOLIC PANEL
ALT: 18 IU/L (ref 0–44)
AST: 20 IU/L (ref 0–40)
Albumin/Globulin Ratio: 1.3 (ref 1.2–2.2)
Albumin: 3.6 g/dL — ABNORMAL LOW (ref 3.7–4.7)
Alkaline Phosphatase: 202 IU/L — ABNORMAL HIGH (ref 44–121)
BUN/Creatinine Ratio: 21 (ref 10–24)
BUN: 46 mg/dL — ABNORMAL HIGH (ref 8–27)
Bilirubin Total: 0.4 mg/dL (ref 0.0–1.2)
CO2: 19 mmol/L — ABNORMAL LOW (ref 20–29)
Calcium: 8.4 mg/dL — ABNORMAL LOW (ref 8.6–10.2)
Chloride: 104 mmol/L (ref 96–106)
Creatinine, Ser: 2.21 mg/dL — ABNORMAL HIGH (ref 0.76–1.27)
Globulin, Total: 2.8 g/dL (ref 1.5–4.5)
Glucose: 113 mg/dL — ABNORMAL HIGH (ref 70–99)
Potassium: 4.3 mmol/L (ref 3.5–5.2)
Sodium: 139 mmol/L (ref 134–144)
Total Protein: 6.4 g/dL (ref 6.0–8.5)
eGFR: 30 mL/min/{1.73_m2} — ABNORMAL LOW (ref >59)

## 2021-10-18 LAB — CBC
Hematocrit: 27 % — ABNORMAL LOW (ref 37.5–51.0)
Hemoglobin: 9.7 g/dL — ABNORMAL LOW (ref 13.0–17.7)
MCH: 34.3 pg — ABNORMAL HIGH (ref 26.6–33.0)
MCHC: 35.9 g/dL — ABNORMAL HIGH (ref 31.5–35.7)
MCV: 95 fL (ref 79–97)
Platelets: 114 10*3/uL — ABNORMAL LOW (ref 150–450)
RBC: 2.83 x10E6/uL — ABNORMAL LOW (ref 4.14–5.80)
RDW: 14.4 % (ref 11.6–15.4)
WBC: 6 10*3/uL (ref 3.4–10.8)

## 2021-10-18 LAB — PRO B NATRIURETIC PEPTIDE: NT-Pro BNP: 15794 pg/mL — ABNORMAL HIGH (ref 0–486)

## 2021-10-18 NOTE — Telephone Encounter (Signed)
Spoke with Silva Bandy per DPR and results were reviewed.

## 2021-10-18 NOTE — Telephone Encounter (Signed)
Patient's wife is returning call.

## 2021-10-19 ENCOUNTER — Telehealth: Payer: Self-pay

## 2021-10-19 ENCOUNTER — Encounter: Payer: Self-pay | Admitting: Oncology

## 2021-10-19 DIAGNOSIS — I10 Essential (primary) hypertension: Secondary | ICD-10-CM

## 2021-10-19 NOTE — Telephone Encounter (Signed)
-----   Message from Park Liter, MD sent at 10/19/2021 11:13 AM EDT ----- Laboratory tests show worsening of kidney function, still acceptable however I will repeat Chem-7 within next week.  He is head CT also was normal.  No explanation for the episode of slurred speech that he suffered from

## 2021-10-19 NOTE — Telephone Encounter (Signed)
Spoke with the patient wife Silva Bandy (ok DPR), informed of results and recommendations. I  have completed for Chem for next week. She is aware

## 2021-10-20 ENCOUNTER — Other Ambulatory Visit: Payer: Medicare Other

## 2021-10-20 ENCOUNTER — Other Ambulatory Visit: Payer: Self-pay | Admitting: Pharmacist

## 2021-10-20 MED FILL — Ferumoxytol Inj 510 MG/17ML (30 MG/ML) (Elemental Fe): INTRAVENOUS | Qty: 17 | Status: AC

## 2021-10-23 ENCOUNTER — Inpatient Hospital Stay: Payer: Medicare Other | Attending: Oncology

## 2021-10-23 ENCOUNTER — Ambulatory Visit (INDEPENDENT_AMBULATORY_CARE_PROVIDER_SITE_OTHER): Payer: Medicare Other | Admitting: Cardiology

## 2021-10-23 ENCOUNTER — Other Ambulatory Visit: Payer: Self-pay

## 2021-10-23 ENCOUNTER — Encounter: Payer: Self-pay | Admitting: Cardiology

## 2021-10-23 VITALS — BP 156/78 | HR 82 | Ht 72.0 in | Wt 217.0 lb

## 2021-10-23 VITALS — BP 161/74 | HR 75 | Temp 98.1°F | Resp 18 | Ht 72.0 in | Wt 221.0 lb

## 2021-10-23 DIAGNOSIS — I2510 Atherosclerotic heart disease of native coronary artery without angina pectoris: Secondary | ICD-10-CM | POA: Diagnosis not present

## 2021-10-23 DIAGNOSIS — I480 Paroxysmal atrial fibrillation: Secondary | ICD-10-CM | POA: Diagnosis not present

## 2021-10-23 DIAGNOSIS — D509 Iron deficiency anemia, unspecified: Secondary | ICD-10-CM | POA: Insufficient documentation

## 2021-10-23 DIAGNOSIS — D508 Other iron deficiency anemias: Secondary | ICD-10-CM

## 2021-10-23 DIAGNOSIS — Z79899 Other long term (current) drug therapy: Secondary | ICD-10-CM | POA: Insufficient documentation

## 2021-10-23 MED ORDER — METOPROLOL SUCCINATE ER 100 MG PO TB24: 100.0000 mg | ORAL_TABLET | Freq: Every day | ORAL | 6 refills | Status: AC

## 2021-10-23 MED ORDER — SODIUM CHLORIDE 0.9 % IV SOLN: Freq: Once | INTRAVENOUS | Status: AC

## 2021-10-23 MED ORDER — SODIUM CHLORIDE 0.9 % IV SOLN
510.0000 mg | Freq: Once | INTRAVENOUS | Status: AC
  Administered 2021-10-23: 510 mg via INTRAVENOUS
  Filled 2021-10-23: qty 17

## 2021-10-23 NOTE — Progress Notes (Signed)
Electrophysiology Office Note   Date:  10/23/2021   ID:  Lawrence [name], DOB Apr 03, 1946, MRN [id number]  PCP:  [name], MD  Cardiologist:  [name] Primary Electrophysiologist:  Zoee Heeney Meredith Leeds, MD    No chief complaint on file.    History of Present Illness: Lawrence [name] is a 75 y.o. male who is being seen today for the evaluation of sick sinus syndrome at the request of [name], MD. Presenting today for electrophysiology evaluation.    He has a history significant for coronary artery disease, paroxysmal atrial fibrillation, atrial flutter, hypertension, hyperlipidemia, renal failure.  He presented to the hospital October 2018 and had a Saint Jude dual-chamber pacemaker implanted 08/22/2017.  Post atrial fibrillation ablation 02/05/2019.  Today, denies symptoms of palpitations, chest pain, shortness of breath, orthopnea, PND, lower extremity edema, claudication, dizziness, presyncope, syncope, bleeding, or neurologic sequela. The patient is tolerating medications without difficulties.  He has been having issues with fatigue over the past few months.  Unfortunately his most recent echo shows an ejection fraction of 40 to 45%.  He has a ventricular paced 100% of the time.  He is also been having some GI bleeding and is now on Feraheme.  He has continued his Eliquis for his atrial fibrillation.   Past Medical History:  Diagnosis Date   Acute on chronic diastolic CHF (congestive heart failure) ([place]) 01/03/2018   Age-related cataract of both eyes 05/08/2018   Anemia    Anemia, iron deficiency 12/28/2014   Arteriosclerosis of coronary artery 05/26/2019   Formatting of this note might be different from the original. Apr 12, 2017 Entered By: [place] Pugh Comment: April, 2018 echocardiogram = normal   Arthritis    knees   Benign neoplasm of colon 02/15/2021   Dec 20, 1999 Entered By: Everitt Amber Comment: 3/00 colonoscopy [facility]: tubular  adenomaMay 01, 2003 Entered By: Everitt Amber Comment: 10/01 colonoscopy: HYPERPLASTIC POLYP   Bradycardia with 31-40 beats per minute 08/21/2017   CAP (community acquired pneumonia) 01/02/2018   Cataracts, bilateral    removed    Cervical spondylosis 05/08/2018   CHF (congestive heart failure), NYHA class II, chronic, diastolic ([place]) 9/93/5701   Chronic airway obstruction, not elsewhere classified    patient denies this dx on 12/04/19 - (Dr Melvyn Novas dx 02/2013)   Chronic ischemic heart disease, unspecified    Chronic obstructive pulmonary disease ([place]) 01/22/2013   Formatting of this note might be different from the original. Overview:  Followed in Pulmonary clinic/ [facility]/ [place]  - PFTs 03/10/2013 FEV1  2.61 (86%) ratio 76 and no better p B2 with DLCO 66 corrects to 86%  Last Assessment & Plan:  - PFTs 03/10/2013 FEV1  2.61 (86%) ratio 76 and no better p B2 with DLCO 66 corrects to 86%  He does not have significant copd and never Lianette Broussard.  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[place]) 05/26/2019   Diverticulosis of colon 02/15/2021   Drug therapy 09/19/2020   Drug-induced acute renal failure ([place]) 02/15/2021   Dyslipidemia 01/09/2016   Dystrophia unguium 02/15/2021   ED (erectile dysfunction) of organic origin 07/05/2014   Enthesopathy of ankle and tarsus 02/15/2021   Erectile dysfunction  due to arterial insufficiency 07/20/2015   Esophageal reflux 05/08/2018   Essential hypertension 05/08/2018   Essential hypertension, benign    GI bleed 07/21/2018   Gout 05/08/2018   Hearing loss    wears hearing aids   Heart failure ([place]) 05/08/2018   Hyperlipidemia, mixed    Mixed hyperlipidemia 05/08/2018   Neck pain 02/15/2021   Nodule of kidney    incidentally  found on CT abdomen 11/2012.  Pending Urology evaluation.    Nontoxic multinodular goiter    Nontoxic uninodular goiter 02/15/2021   Obesity    Obstructive sleep apnea (adult) (pediatric) 01/09/2016   Other testicular hypofunction    Pacemaker 09/13/2017   St Jude Device    Paroxysmal atrial fibrillation ([place]) 11/26/2016   Peripheral arterial occlusive disease ([place]) 02/15/2021   Peripheral vascular disease ([place]) 01/09/2016   Formatting of this note might be different from the original. Right SFA by segmental pressure, conservative management   Peripheral vascular disease, unspecified ([place]) 01/09/2016   Overview:  Right SFA by segmental pressure, conservative management   Permanent atrial fibrillation ([place])    Peyronie's disease 07/20/2015   Pneumonia    x 1   Presence of permanent cardiac pacemaker    ST Jude PPM   Primary osteoarthritis involving multiple joints 05/26/2019   PTSD (post-traumatic stress disorder) 05/08/2018   PUD (peptic ulcer disease)    Pulmonary hypertension ([place]) 01/09/2016   Renal cyst, right 07/05/2014   Renal insufficiency, mild    hx of   Renal lesion 05/25/2014   Secondary polycythemia 02/15/2021   Sensorineural hearing loss (SNHL) of both ears 05/08/2018   Sleep apnea    Does not use CPAP in last 3 months per patient 12/04/19   Small bowel ischemia w pneumatosis s/p ileocectomy 07/21/2018    Stage 2 chronic kidney disease 05/08/2018   Status post coronary artery bypass graft 01/09/2016   Tear of medial cartilage or meniscus of knee, current 02/15/2021   Jan 13, 2002 Entered By: Everitt Amber Comment: Left side. Probable. Patient unable to have MRI.   Testicular hypofunction 05/08/2018   Tinnitus of both ears 05/08/2018   Type 2 diabetes mellitus with diabetic neuropathy ([place]) 05/08/2018   Past Surgical History:  Procedure Laterality Date   ATRIAL FIBRILLATION ABLATION N/A 02/05/2019   Procedure: ATRIAL FIBRILLATION ABLATION;  Surgeon: Constance Haw, MD;   Location: [facility];  Service: Cardiovascular;  Laterality: N/A;   CHOLECYSTECTOMY     COLONOSCOPY  05/11/2015   Colonic polyps statuts post polypectomy. Moderate predominalty sigmoid diverticulosis. Internal hemorrhoids. Tubular adenoma.    COLONOSCOPY WITH PROPOFOL N/A 05/09/2021   Procedure: COLONOSCOPY WITH PROPOFOL;  Surgeon: Jackquline Denmark, MD;  Location: [facility];  Service: Endoscopy;  Laterality: N/A;   CORONARY ARTERY BYPASS GRAFT  2007   x 4   ESOPHAGOGASTRODUODENOSCOPY     10/06/2012:  hiatal hernia, moderate gastritis.  2012: [facility]:  one polyp.  Due next 2017   ESOPHAGOGASTRODUODENOSCOPY (EGD) WITH PROPOFOL N/A 05/09/2021   Procedure: ESOPHAGOGASTRODUODENOSCOPY (EGD) WITH PROPOFOL;  Surgeon: Jackquline Denmark, MD;  Location: [facility];  Service: Endoscopy;  Laterality: N/A;   EYE SURGERY     left eye metal removed    EYE SURGERY     bilateral cataracts removed   GI RADIOFREQUENCY ABLATION  05/09/2021   Procedure: GI RADIOFREQUENCY ABLATION;  Surgeon: Jackquline Denmark, MD;  Location: [facility];  Service: Endoscopy;;   HOT HEMOSTASIS N/A 05/09/2021   Procedure: HOT HEMOSTASIS (ARGON  PLASMA COAGULATION/BICAP);  Surgeon: Jackquline Denmark, MD;  Location: [facility];  Service: Endoscopy;  Laterality: N/A;   INCISIONAL HERNIA REPAIR N/A 12/08/2019   Procedure: OPEN INCISIONAL HERNIA REPAIR WITH MESH;  Surgeon: Erroll Luna, MD;  Location: [facility];  Service: General;  Laterality: N/A;   INGUINAL HERNIA REPAIR Left 12/08/2019   Procedure: HERNIA REPAIR LEFT  INGUINAL ADULT WITH MESH;  Surgeon: Erroll Luna, MD;  Location: [facility];  Service: General;  Laterality: Left;   KNEE DEBRIDEMENT Right    LAPAROTOMY N/A 07/21/2018   Procedure: EXPLORATORY LAPAROTOMY WITH SMALL BOWEL RESECTION;  Surgeon: Erroll Luna, MD;  Location: [facility];  Service: General;  Laterality: N/A;   PACEMAKER IMPLANT N/A 08/22/2017   Procedure: Pacemaker Implant; St. Jude PPM  Surgeon: Constance Haw, MD;  Location: [facility];  Service: Cardiovascular;  Laterality: N/A;   POLYPECTOMY  05/09/2021   Procedure: POLYPECTOMY;  Surgeon: Jackquline Denmark, MD;  [facility]: [facility];  Service: Endoscopy;;   SHOULDER SURGERY     left shoulder   TONSILECTOMY, ADENOIDECTOMY, BILATERAL MYRINGOTOMY AND TUBES     WISDOM TOOTH EXTRACTION       Current Outpatient Medications  Medication Sig Dispense Refill   acetaminophen (TYLENOL) 325 MG tablet Take 650 mg by mouth every 6 (six) hours as needed for moderate pain or headache.     allopurinol (ZYLOPRIM) 100 MG tablet Take 100 mg by mouth daily.     apixaban (ELIQUIS) 5 MG TABS tablet Take 5 mg by mouth 2 (two) times daily.     atorvastatin (LIPITOR) 20 MG tablet Take 1 tablet (20 mg total) by mouth daily. 90 tablet 3   cholecalciferol (VITAMIN D) 25 MCG (1000 UNIT) tablet Take 1,000 Units by mouth 2 (two) times daily.     cholestyramine (QUESTRAN) 4 g packet Take 4 g by mouth 4 (four) times daily as needed (diarrhea).     colchicine 0.6 MG tablet Take 0.6-1.8 mg by mouth as needed (Gout).     furosemide (LASIX) 40 MG tablet Take 40 mg by mouth daily. On Monday and Friday take 60 mg by mouth daily.      glimepiride (AMARYL) 2 MG tablet Take 2 mg by mouth 2 (two) times daily.     Ipratropium-Albuterol (COMBIVENT) 20-100 MCG/ACT AERS respimat Inhale 1 puff into the lungs every 6 (six) hours as needed for wheezing or shortness of breath.     ipratropium-albuterol (DUONEB) 0.5-2.5 (3) MG/3ML SOLN Take 3 mLs by nebulization every 6 (six) hours as needed (shortness of breath).     isosorbide mononitrate (IMDUR) 30 MG 24 hr tablet Take 1 tablet (30 mg total) by mouth daily. 90 tablet 3   lidocaine (LIDODERM) 5 % Place 1-3 patches onto the skin daily as needed (pain). Remove & Discard patch within 12 hours or as directed by MD     metolazone (ZAROXOLYN) 2.5 MG tablet Take 2.5 mg by mouth daily as needed (weight gain of 5 lbs in a day). Take 20  minutes before Furosemide if taking     metoprolol tartrate (LOPRESSOR) 100 MG tablet Take 1 tablet (100 mg total) by mouth 2 (two) times daily. 180 tablet 2   nitroGLYCERIN (NITROSTAT) 0.4 MG SL tablet Place 0.4 mg under the tongue every 5 (five) minutes as needed for chest pain.      ofloxacin (OCUFLOX) 0.3 % ophthalmic solution Place 1 drop into the right eye daily.     Oxcarbazepine (TRILEPTAL) 300 MG tablet  Take 300-600 mg by mouth as directed. Take 300 mg in the morning and 600 mg at bedtime     OZEMPIC, 0.25 OR 0.5 MG/DOSE, 2 MG/1.5ML SOPN Inject 0.25 mg into the skin every Monday.     pantoprazole (PROTONIX) 40 MG tablet Take 40 mg by mouth daily before breakfast.      prednisoLONE acetate (PRED FORTE) 1 % ophthalmic suspension Place 1 drop into the right eye daily.     No current facility-administered medications for this visit.    Allergies:   Ciprofloxacin, Sulfamethoxazole-trimethoprim, Lisinopril, Naproxen, and Omeprazole   Social History:  The patient  reports that he quit smoking about 16 years ago. His smoking use included cigarettes. He has a 45.00 pack-year smoking history. He has quit using smokeless tobacco.  His smokeless tobacco use included chew. He reports current alcohol use. He reports that he does not use drugs.   Family History:  The patient's family history includes Asthma in his sister; Cancer in his father and mother; Colon cancer in his mother; Emphysema in an other family member; Esophageal cancer in his father; Hypertension in his mother; Kidney cancer in his mother; Stroke in his father.   ROS:  Please see the history of present illness.   Otherwise, review of systems is positive for none.   All other systems are reviewed and negative.   PHYSICAL EXAM: VS:  There were no vitals taken for this visit. , BMI There is no height or weight on file to calculate BMI. GEN: Well nourished, well developed, in no acute distress  HEENT: normal  Neck: no JVD, carotid  bruits, or masses Cardiac: RRR; no murmurs, rubs, or gallops,no edema  Respiratory:  clear to auscultation bilaterally, normal work of breathing GI: soft, nontender, nondistended, + BS MS: no deformity or atrophy  Skin: warm and dry, device site well healed Neuro:  Strength and sensation are intact Psych: euthymic mood, full affect  EKG:  EKG is not ordered today. Personal review of the ekg ordered 10/17/21 shows AV paced, rate 63  Personal review of the device interrogation today. Results in [facility]: 09/21/2021: TSH 3.218 10/17/2021: ALT 18; BUN 46; Creatinine, Ser 2.21; Hemoglobin 9.7; NT-Pro BNP 15,794; Platelets 114; Potassium 4.3; Sodium 139    Lipid Panel  No results found for: CHOL, TRIG, HDL, CHOLHDL, VLDL, LDLCALC, LDLDIRECT   Wt Readings from Last 3 Encounters:  10/17/21 223 lb 9.6 oz (101.4 kg)  09/21/21 206 lb 12.8 oz (93.8 kg)  08/30/21 214 lb 12.8 oz (97.4 kg)      Other studies Reviewed: Additional studies/ records that were reviewed today include: TTE 03/28/21 Review of the above records today demonstrates:   1. Left ventricular ejection fraction, by estimation, is 40 to 45%. The  left ventricle has mildly decreased function. The left ventricle  demonstrates global hypokinesis. There is severe concentric left  ventricular hypertrophy. Left ventricular diastolic   parameters are consistent with Grade III diastolic dysfunction  (restrictive). The average left ventricular global longitudinal strain is  -5.3 %. The global longitudinal strain is abnormal.   2. Right ventricular systolic function is moderately reduced. The right  ventricular size is moderately enlarged. There is mildly elevated  pulmonary artery systolic pressure.   3. The mitral valve is normal in structure. Trivial mitral valve  regurgitation. No evidence of mitral stenosis.   4. Tricuspid valve regurgitation is mild to moderate.   5. The aortic valve is calcified. Aortic valve  regurgitation  is not  visualized. Mild aortic valve sclerosis is present, with no evidence of  aortic valve stenosis.   6. The inferior vena cava is normal in size with greater than 50%  respiratory variability, suggesting right atrial pressure of 3 mmHg.   ASSESSMENT AND PLAN:  1.  Sick sinus syndrome: Status post Saint Jude dual-chamber pacemaker.  Device functioning appropriately.  No changes at this time.  2.  Paroxysmal atrial fibrillation: Status post ablation 02/05/2019.  Currently on Eliquis 5 mg twice daily.  CHA2DS2-VASc of 4.  Unfortunately his atrial fibrillation burden is up at 13%.  He has been having GI bleeding in the past and is on Feraheme currently.  I do think he would benefit to get off Eliquis.  We Tison Leibold refer him for potential watchman implant.  I have seen Kolby Schara is a 75 y.o. male in the office today. The patient is felt to be a poor candidate for long-term anticoagulation because of GI bleeds.  Their CHADS-2-Vasc Score and unadjusted Ischemic Stroke Rate (% per year) is equal to 4.8 % stroke rate/year from a score of 4, necessitating a strategy of stroke prevention with either long-term oral anticoagulation or left atrial appendage occlusion therapy. We have discussed their bleeding risk in the context of their comorbid medical problems, as well as the rationale for referral for evaluation of Watchman left atrial appendage occlusion therapy. While the patient is at high long-term bleeding risk, they may be appropriate for short-term anticoagulation. Based on this individual patient's stroke and bleeding risk, a shared decision has been made to refer the patient for consideration of Watchman left atrial appendage closure utilizing the [employer] of Cardiology shared decision tool.   3.  Coronary artery disease: No current chest pain.  Plan per primary cardiology.  4.  Hypertension: Elevated today.  Has been low in the past.  We Yoland Scherr stop  metoprolol and start Toprol-XL 200 mg.  5.  Hyperlipidemia: Continue statin per primary cardiology   Current medicines are reviewed at length with the patient today.   The patient does not have concerns regarding his medicines.  The following changes were made today: Stop metoprolol start Toprol-XL  Labs/ tests ordered today include:  No orders of the defined types were placed in this encounter.   Disposition:   FU with Ellicia Alix 6 months  Signed, Christain Niznik Meredith Leeds, MD  10/23/2021 10:07 AM     [facility] [address] [place] [address] [phone] (office) [phone] (fax)

## 2021-10-23 NOTE — Patient Instructions (Signed)
Ferumoxytol Injection What is this medication? FERUMOXYTOL (FER ue MOX i tol) treats low levels of iron in your body (iron deficiency anemia). Iron is a mineral that plays an important role in making red blood cells, which carry oxygen from your lungs to the rest of your body. This medicine may be used for other purposes; ask your health care provider or pharmacist if you have questions. COMMON BRAND NAME(S): Feraheme What should I tell my care team before I take this medication? They need to know if you have any of these conditions: Anemia not caused by low iron levels High levels of iron in the blood Magnetic resonance imaging (MRI) test scheduled An unusual or allergic reaction to iron, other medications, foods, dyes, or preservatives Pregnant or trying to get pregnant Breast-feeding How should I use this medication? This medication is for injection into a vein. It is given in a hospital or clinic setting. Talk to your care team the use of this medication in children. Special care may be needed. Overdosage: If you think you have taken too much of this medicine contact a poison control center or emergency room at once. NOTE: This medicine is only for you. Do not share this medicine with others. What if I miss a dose? It is important not to miss your dose. Call your care team if you are unable to keep an appointment. What may interact with this medication? Other iron products This list may not describe all possible interactions. Give your health care provider a list of all the medicines, herbs, non-prescription drugs, or dietary supplements you use. Also tell them if you smoke, drink alcohol, or use illegal drugs. Some items may interact with your medicine. What should I watch for while using this medication? Visit your care team regularly. Tell your care team if your symptoms do not start to get better or if they get worse. You may need blood work done while you are taking this  medication. You may need to follow a special diet. Talk to your care team. Foods that contain iron include: whole grains/cereals, dried fruits, beans, or peas, leafy green vegetables, and organ meats (liver, kidney). What side effects may I notice from receiving this medication? Side effects that you should report to your care team as soon as possible: Allergic reactions-skin rash, itching, hives, swelling of the face, lips, tongue, or throat Low blood pressure-dizziness, feeling faint or lightheaded, blurry vision Shortness of breath Side effects that usually do not require medical attention (report to your care team if they continue or are bothersome): Flushing Headache Joint pain Muscle pain Nausea Pain, redness, or irritation at injection site This list may not describe all possible side effects. Call your doctor for medical advice about side effects. You may report side effects to FDA at 1-800-FDA-1088. Where should I keep my medication? This medication is given in a hospital or clinic and will not be stored at home. NOTE: This sheet is a summary. It may not cover all possible information. If you have questions about this medicine, talk to your doctor, pharmacist, or health care provider.  2022 Elsevier/Gold Standard (2021-04-28 15:35:12)  

## 2021-10-23 NOTE — Patient Instructions (Signed)
Medication Instructions:  Your physician has recommended you make the following change in your medication:  STOP Metoprolol Tartrate (Lopressor) START Metoprolol Succinate (Toprol) 200 mg once daily at bedtime  *If you need a refill on your cardiac medications before your next appointment, please call your pharmacy*   Lab Work: None ordered   Testing/Procedures: None ordered   Follow-Up: At [facility], you and your health needs are our priority.  As part of our continuing mission to provide you with exceptional heart care, we have created designated Provider Care Teams.  These Care Teams include your primary Cardiologist (physician) and Advanced Practice Providers (APPs -  Physician Assistants and Nurse Practitioners) who all work together to provide you with the care you need, when you need it.  Remote monitoring is used to monitor your Pacemaker or ICD from home. This monitoring reduces the number of office visits required to check your device to one time per year. It allows [place] to keep an eye on the functioning of your device to ensure it is working properly. You are scheduled for a device check from home on 11/24/2021. You may send your transmission at any time that day. If you have a wireless device, the transmission will be sent automatically. After your physician reviews your transmission, you will receive a postcard with your next transmission date.   You have been referred to Dr. Quentin Ore to discuss watchman   Your next appointment:   6 month(s)  The format for your next appointment:   In Person  Provider:   Allegra Lai, MD   Thank you for choosing [facility]!!   Lawrence Curet, RN [phone]    Other Instructions    Left Atrial Appendage Closure Device Implantation Left atrial appendage (LAA) closure device implantation is a procedure to put a small device in the LAA of the heart. The LAA is a small sac in the wall of the heart's left upper chamber.  Blood clots can form in the LAA in people with atrial fibrillation (AFib). The device closes the LAA to help prevent a blood clot and stroke. AFib is a type of irregular or rapid heartbeat (arrhythmia). There is an increased risk of blood clots and stroke with AFib. This procedure helps to reduce that risk. Tell a health care provider about: Any allergies you have. All medicines you are taking, including vitamins, herbs, eye drops, creams, and over-the-counter medicines. Any problems you or family members have had with anesthetic medicines. Any blood disorders you have. Any surgeries you have had. Any medical conditions you have. Whether you are pregnant or may be pregnant. What are the risks? Generally, this is a safe procedure. However, problems may occur, including: Infection. Bleeding. Allergic reactions to medicines or dyes. Damage to nearby structures or organs. Heart attack. Stroke. Blood clots. Changes in heart rhythm. Device failure. What happens before the procedure? Staying hydrated Follow instructions from your health care provider about hydration, which may include: Up to 2 hours before the procedure - you may continue to drink clear liquids, such as water, clear fruit juice, black coffee, and plain tea. Eating and drinking restrictions Follow instructions from your health care provider about eating and drinking, which may include: 8 hours before the procedure - stop eating heavy meals or foods, such as meat, fried foods, or fatty foods. 6 hours before the procedure - stop eating light meals or foods, such as toast or cereal. 6 hours before the procedure - stop drinking milk or drinks that  contain milk. 2 hours before the procedure - stop drinking clear liquids. Medicines Ask your health care provider about: Changing or stopping your regular medicines. This is especially important if you are taking diabetes medicines or blood thinners. Taking medicines such as aspirin  and ibuprofen. These medicines can thin your blood. Do not take these medicines unless your health care provider tells you to take them. Taking over-the-counter medicines, vitamins, herbs, and supplements. Tests You may have blood tests and a physical exam. You may have an electrocardiogram (ECG). This test checks your heart's electrical patterns and rhythms. General instructions Do not use any products that contain nicotine or tobacco. These include cigarettes, chewing tobacco, and vaping devices, such as e-cigarettes. If you need help quitting, ask your health care provider. Ask your health care provider what steps will be taken to help prevent infection. These steps may include: Removing hair at the surgery site. Washing skin with a germ-killing soap. Taking antibiotic medicine. Plan to have a responsible adult take you home from the hospital or clinic. Plan to have a responsible adult care for you for the time you are told after you leave the hospital or clinic. This is important. What happens during the procedure? An IV will be inserted into one of your veins. You will be given one or more of the following: A medicine to help you relax (sedative). A medicine to make you fall asleep (general anesthetic). A small incision will be made in your groin area. A small wire will be put through the incision and into a blood vessel. Dye may be injected so X-rays can be used to guide the wire through the blood vessel. A long, thin tube (catheter) will be put over the small wire and moved up through the blood vessel to reach your heart. The closure device will be moved through the catheter until it reaches your heart. A small hole will be made in the septum (transseptal puncture). The septum is a thin tissue that separates the upper two chambers of the heart. The device will be placed so that it closes the LAA. X-rays will be done to make sure the device is in the right place. The catheter and wire  will be removed. The closure device will remain in your heart. After pressure is applied over the catheter site to prevent bleeding, a bandage (dressing) will be placed over the site where the catheter was inserted. The procedure may vary among health care providers and hospitals. What happens after the procedure? Your blood pressure, heart rate, breathing rate, and blood oxygen level will be monitored until you leave the hospital or clinic. You may have to wear compression stockings. These stockings help to prevent blood clots and reduce swelling in your legs. If you were given a sedative during the procedure, it can affect you for several hours. Do not drive or operate machinery until your health care provider says it is safe. You may be given pain medicine. You may need to drink more fluids to wash (flush) the dye out of your body. Drink enough fluid to keep your urine pale yellow. Take over-the-counter and prescription medicines only as told by your health care provider. This is especially important if you were given blood thinners. Summary Left atrial appendage (LAA) closure device implantation is a procedure that is done to put a small device in the LAA of the heart. The LAA is a small sac in the wall of the heart's left upper chamber. The device closes the  LAA to prevent stroke and other problems. Follow instructions from your health care provider before and after the procedure. This information is not intended to replace advice given to you by your health care provider. Make sure you discuss any questions you have with your health care provider. Document Revised: 08/18/2020 Document Reviewed: 08/18/2020 Elsevier Patient Education  [place].

## 2021-10-24 DIAGNOSIS — N189 Chronic kidney disease, unspecified: Secondary | ICD-10-CM | POA: Diagnosis not present

## 2021-10-24 DIAGNOSIS — N400 Enlarged prostate without lower urinary tract symptoms: Secondary | ICD-10-CM | POA: Diagnosis not present

## 2021-10-24 DIAGNOSIS — G4733 Obstructive sleep apnea (adult) (pediatric): Secondary | ICD-10-CM | POA: Diagnosis not present

## 2021-10-24 DIAGNOSIS — Z6833 Body mass index (BMI) 33.0-33.9, adult: Secondary | ICD-10-CM | POA: Diagnosis not present

## 2021-10-26 ENCOUNTER — Encounter: Payer: Self-pay | Admitting: Oncology

## 2021-10-26 MED FILL — Ferumoxytol Inj 510 MG/17ML (30 MG/ML) (Elemental Fe): INTRAVENOUS | Qty: 17 | Status: AC

## 2021-10-27 ENCOUNTER — Inpatient Hospital Stay: Payer: Medicare Other | Attending: Oncology

## 2021-10-27 ENCOUNTER — Other Ambulatory Visit: Payer: Self-pay

## 2021-10-27 VITALS — BP 120/55 | HR 60 | Temp 98.6°F | Resp 18 | Ht 72.0 in | Wt 214.8 lb

## 2021-10-27 DIAGNOSIS — I509 Heart failure, unspecified: Secondary | ICD-10-CM | POA: Diagnosis not present

## 2021-10-27 DIAGNOSIS — K922 Gastrointestinal hemorrhage, unspecified: Secondary | ICD-10-CM | POA: Insufficient documentation

## 2021-10-27 DIAGNOSIS — R5383 Other fatigue: Secondary | ICD-10-CM | POA: Diagnosis not present

## 2021-10-27 DIAGNOSIS — Z79899 Other long term (current) drug therapy: Secondary | ICD-10-CM | POA: Diagnosis not present

## 2021-10-27 DIAGNOSIS — D509 Iron deficiency anemia, unspecified: Secondary | ICD-10-CM

## 2021-10-27 DIAGNOSIS — D500 Iron deficiency anemia secondary to blood loss (chronic): Secondary | ICD-10-CM | POA: Insufficient documentation

## 2021-10-27 DIAGNOSIS — D508 Other iron deficiency anemias: Secondary | ICD-10-CM

## 2021-10-27 MED ORDER — SODIUM CHLORIDE 0.9 % IV SOLN: Freq: Once | INTRAVENOUS | Status: AC

## 2021-10-27 MED ORDER — SODIUM CHLORIDE 0.9 % IV SOLN
510.0000 mg | Freq: Once | INTRAVENOUS | Status: AC
  Administered 2021-10-27: 510 mg via INTRAVENOUS
  Filled 2021-10-27: qty 17

## 2021-10-27 NOTE — Patient Instructions (Signed)

## 2021-10-27 NOTE — Progress Notes (Signed)
1PT STABLE AT TIME OF DISCHARGE 419:

## 2021-11-01 ENCOUNTER — Ambulatory Visit (INDEPENDENT_AMBULATORY_CARE_PROVIDER_SITE_OTHER): Payer: Medicare Other | Admitting: Cardiology

## 2021-11-01 ENCOUNTER — Encounter: Payer: Self-pay | Admitting: Cardiology

## 2021-11-01 ENCOUNTER — Other Ambulatory Visit: Payer: Self-pay

## 2021-11-01 VITALS — BP 114/52 | HR 76 | Ht 72.0 in | Wt 216.0 lb

## 2021-11-01 DIAGNOSIS — I5032 Chronic diastolic (congestive) heart failure: Secondary | ICD-10-CM

## 2021-11-01 DIAGNOSIS — I2510 Atherosclerotic heart disease of native coronary artery without angina pectoris: Secondary | ICD-10-CM

## 2021-11-01 DIAGNOSIS — I739 Peripheral vascular disease, unspecified: Secondary | ICD-10-CM | POA: Diagnosis not present

## 2021-11-01 DIAGNOSIS — I480 Paroxysmal atrial fibrillation: Secondary | ICD-10-CM

## 2021-11-01 DIAGNOSIS — E785 Hyperlipidemia, unspecified: Secondary | ICD-10-CM | POA: Diagnosis not present

## 2021-11-01 NOTE — Progress Notes (Signed)
Cardiology Office Note:    Date:  11/01/2021   ID:  Lawrence [name], DOB 08-Jul-1946, MRN [id number]  PCP:  [name], MD  Cardiologist:  [name], MD    Referring MD: [name], MD   No chief complaint on file. I am doing much better  History of Present Illness:    Lawrence [name] is a 75 y.o. male   with very complex past medical history that is significant for coronary artery disease, status post coronary bypass graft many years ago, pacemaker present which is dual-chamber, cardiomyopathy, latest ejection fraction 40 to 45%, diabetes type 2, essential hypertension, paroxysmal atrial fibrillation, kidney dysfunction. He comes today to frequent office visit because of decompensated congestive heart failure.  We did notice latest decompensation of his heart failure as well as decrease in left ventricle ejection fraction.   He comes today 2 months of follow-up he did receive some iron infusion and he feels dramatically better.  He does have much more energy and he is not tired anymore.  Recently he did see EP team for consideration of watchman device implantation.  He will be referred for that procedure denies have any chest pain tightness squeezing pressure burning chest.  Past Medical History:  Diagnosis Date   Acute on chronic diastolic CHF (congestive heart failure) ([place]) 01/03/2018   Adenomatous polyp of ascending colon    Age-related cataract of both eyes 05/08/2018   Age-related nuclear cataract, right eye 05/08/2018   Anemia    Anemia, iron deficiency 12/28/2014   Arteriosclerosis of coronary artery 05/26/2019   Formatting of this note might be different from the original. Apr 12, 2017 Entered By: [place] Pugh Comment: April, 2018 echocardiogram = normal   Arthralgia of multiple joints 08/29/2021   Arthritis    knees   AVM (arteriovenous malformation) of colon without hemorrhage    Benign neoplasm of colon 02/15/2021   Dec 20, 1999 Entered  By: Everitt Amber Comment: 3/00 colonoscopy [facility]: tubular adenomaMay 01, 2003 Entered By: Everitt Amber Comment: 10/01 colonoscopy: HYPERPLASTIC POLYP   Bradycardia with 31-40 beats per minute 08/21/2017   Calcaneal spur 02/15/2021   May 10, 2011 Entered By: Tobias Alexander C Comment: See: 05/08/11 Foot X Rays   CAP (community acquired pneumonia) 01/02/2018   Cervical spondylosis 05/08/2018   CHF (congestive heart failure), NYHA class II, chronic, diastolic ([place]) 15/72/6203   Chronic ischemic heart disease, unspecified    Chronic obstructive pulmonary disease ([place]) 01/22/2013   Formatting of this note might be different from the original. Overview:  Followed in Pulmonary clinic/ [facility]/ [facility]  - PFTs 03/10/2013 FEV1  2.61 (86%) ratio 76 and no better p B2 with DLCO 66 corrects to 86%  Last Assessment & Plan:  - PFTs 03/10/2013 FEV1  2.61 (86%) ratio 76 and no better p B2 with DLCO 66 corrects to 86%  He does not have significant copd and never will.  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   Diverticulosis of colon 02/15/2021   Drug therapy 09/19/2020   Drug-induced acute renal failure ([place]) 02/15/2021   Dyslipidemia 01/09/2016   Dystrophia unguium 02/15/2021  ED (erectile dysfunction) of organic origin 07/05/2014   Enthesopathy of ankle and tarsus 02/15/2021   Erectile dysfunction due to arterial insufficiency 07/20/2015   Esophageal reflux 05/08/2018   Essential hypertension 05/08/2018   Essential hypertension, benign    GAVE (gastric antral vascular ectasia)    GI bleed 07/21/2018   Gout 05/08/2018   Hearing loss    wears  hearing aids   Heart failure ([place]) 05/08/2018   Heme positive stool    Hyperlipidemia, mixed    Mixed hyperlipidemia 05/08/2018   Neck pain 02/15/2021   Nodule of kidney    incidentally found on CT abdomen 11/2012.  Pending Urology evaluation.    Nontoxic multinodular goiter    Nontoxic uninodular goiter 02/15/2021   Obesity    Obstructive sleep apnea (adult) (pediatric) 01/09/2016   Pacemaker 09/13/2017   St Jude Device    Paroxysmal atrial fibrillation ([place]) 11/26/2016   Peripheral arterial occlusive disease ([place]) 02/15/2021   Peripheral vascular disease ([place]) 01/09/2016   Formatting of this note might be different from the original. Right SFA by segmental pressure, conservative management   Peripheral vascular disease, unspecified ([place]) 01/09/2016   Overview:  Right SFA by segmental pressure, conservative management   Peyronie's disease 07/20/2015   Pneumonia    x 1   Presence of permanent cardiac pacemaker    ST Jude PPM   Primary osteoarthritis involving multiple joints 05/26/2019   PTSD (post-traumatic stress disorder) 05/08/2018   PUD (peptic ulcer disease)    Pulmonary hypertension ([place]) 01/09/2016   Renal cyst, right 07/05/2014   Renal insufficiency, mild    hx of   Renal lesion 05/25/2014   Secondary polycythemia 02/15/2021   Senile cataract 02/15/2021   Sensorineural hearing loss (SNHL) of both ears 05/08/2018   Sleep apnea    Does not use CPAP in last 3 months per patient 12/04/19   Small bowel ischemia w pneumatosis s/p ileocectomy 07/21/2018    Stage 2 chronic kidney disease 05/08/2018   Status post coronary artery bypass graft 01/09/2016   Tear of medial cartilage or meniscus of knee, current 02/15/2021   Jan 13, 2002 Entered By: Everitt Amber Comment: Left side. Probable. Patient unable to have MRI.   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 Moderate predominalty sigmoid diverticulosis. Internal hemorrhoids. Tubular adenoma.    COLONOSCOPY WITH PROPOFOL N/A 05/09/2021   Procedure: COLONOSCOPY WITH PROPOFOL;  Surgeon: Jackquline Denmark, MD;  Location: [facility];  Service: Endoscopy;  Laterality: N/A;   CORONARY ARTERY BYPASS GRAFT  2007   x 4   ESOPHAGOGASTRODUODENOSCOPY     10/06/2012:  hiatal hernia, moderate gastritis.  2012: [facility]oscopy [facility]:  one polyp.  Due next 2017   ESOPHAGOGASTRODUODENOSCOPY (EGD) WITH PROPOFOL N/A 05/09/2021   Procedure: ESOPHAGOGASTRODUODENOSCOPY (EGD) WITH PROPOFOL;  Surgeon: Jackquline Denmark, MD;  [facility]: [facility];  Service: Endoscopy;  Laterality: N/A;   EYE SURGERY     left eye metal removed    EYE SURGERY     bilateral cataracts removed   GI RADIOFREQUENCY  ABLATION  05/09/2021   Procedure: GI RADIOFREQUENCY ABLATION;  Surgeon: Jackquline Denmark, MD;  [facility]: [facility];  Service: Endoscopy;;   HOT HEMOSTASIS N/A 05/09/2021   Procedure: HOT HEMOSTASIS (ARGON PLASMA COAGULATION/BICAP);  Surgeon: Jackquline Denmark, MD;  Location: [facility];  Service: Endoscopy;  Laterality: N/A;   INCISIONAL HERNIA REPAIR N/A 12/08/2019   Procedure: OPEN INCISIONAL HERNIA REPAIR WITH MESH;  Surgeon: Erroll Luna, MD;  Location: [facility];  Service: General;  Laterality: N/A;   INGUINAL HERNIA REPAIR Left 12/08/2019   Procedure: HERNIA REPAIR LEFT  INGUINAL ADULT WITH MESH;  Surgeon: Erroll Luna, MD;  Location: [facility];  Service: General;  Laterality: Left;   KNEE DEBRIDEMENT Right    LAPAROTOMY N/A 07/21/2018   Procedure: EXPLORATORY LAPAROTOMY WITH  SMALL BOWEL RESECTION;  Surgeon: Erroll Luna, MD;  Location: [place] [facility];  Service: General;  Laterality: N/A;   PACEMAKER IMPLANT N/A 08/22/2017   Procedure: Pacemaker Implant; St. Jude PPM  Surgeon: Constance Haw, MD;  Location: [facility];  Service: Cardiovascular;  Laterality: N/A;   POLYPECTOMY  05/09/2021   Procedure: POLYPECTOMY;  Surgeon: Jackquline Denmark, MD;  [facility];  Service: Endoscopy;;   SHOULDER SURGERY     left shoulder   TONSILECTOMY, ADENOIDECTOMY, BILATERAL MYRINGOTOMY AND TUBES     WISDOM TOOTH EXTRACTION      Current Medications: Current Meds  Medication Sig   acetaminophen (TYLENOL) 325 MG tablet Take 650 mg by mouth every 6 (six) hours as needed for moderate pain or headache.   allopurinol (ZYLOPRIM) 100 MG tablet Take 100 mg by mouth daily.   apixaban (ELIQUIS) 5 MG TABS tablet Take 5 mg by mouth 2 (two) times daily.   atorvastatin (LIPITOR) 20 MG tablet Take 20 mg by mouth daily.   cholecalciferol (VITAMIN D) 25 MCG (1000 UNIT) tablet Take 1,000 Units by mouth 2 (two) times daily.   cholestyramine (QUESTRAN) 4 g packet Take 4 g by mouth 4 (four) times daily as needed (diarrhea).   colchicine 0.6 MG tablet Take 0.6-1.8 mg by mouth as needed (Gout).   furosemide (LASIX) 40 MG tablet Take 40 mg by mouth daily. On Monday and Friday take 60 mg by mouth daily.    glimepiride (AMARYL) 2 MG tablet Take 2 mg by mouth 2 (two) times daily.   Ipratropium-Albuterol (COMBIVENT) 20-100 MCG/ACT AERS respimat Inhale 1 puff into the lungs every 6 (six) hours as needed for wheezing or shortness of breath.   ipratropium-albuterol (DUONEB) 0.5-2.5 (3) MG/3ML SOLN Take 3 mLs by nebulization every 6 (six) hours as needed (shortness of breath).   isosorbide mononitrate (IMDUR) 30 MG 24 hr tablet Take 1 tablet (30 mg total) by mouth daily.   lidocaine (LIDODERM) 5 % Place 1-3 patches onto the skin daily as needed (pain). Remove & Discard patch within 12 hours or as  directed by MD   metolazone (ZAROXOLYN) 2.5 MG tablet Take 2.5 mg by mouth daily as needed (weight gain of 5 lbs in a day). Take 20 minutes before Furosemide if taking   metoprolol succinate (TOPROL-XL) 100 MG 24 hr tablet Take 1 tablet (100 mg total) by mouth daily. Take with or immediately following a meal.   nitroGLYCERIN (NITROSTAT) 0.4 MG SL tablet Place 0.4 mg under the tongue every 5 (five) minutes as needed for chest pain.    ofloxacin (OCUFLOX) 0.3 % ophthalmic solution Place 1 drop into the right eye daily.   Oxcarbazepine (TRILEPTAL) 300 MG tablet Take 300-600 mg  by mouth as directed. Take 300 mg in the morning and 600 mg at bedtime   OZEMPIC, 0.25 OR 0.5 MG/DOSE, 2 MG/1.5ML SOPN Inject 0.25 mg into the skin every Monday.   pantoprazole (PROTONIX) 40 MG tablet Take 40 mg by mouth daily before breakfast.    prednisoLONE acetate (PRED FORTE) 1 % ophthalmic suspension Place 1 drop into the right eye daily.   tamsulosin (FLOMAX) 0.4 MG CAPS capsule Take 0.4 mg by mouth at bedtime.     Allergies:   Ciprofloxacin, Sulfamethoxazole-trimethoprim, Lisinopril, Naproxen, and Omeprazole   Social History   Socioeconomic History   Marital status: Married    Spouse name: Not on file   Number of children: 5   Years of education: Not on file   Highest education level: Not on file  Occupational History   Occupation: Retired    Comment: Medical illustrator; retired Chiropodist  Tobacco Use   Smoking status: Former    Packs/day: 1.50    Years: 30.00    Pack years: 45.00    Types: Cigarettes    Quit date: 2006    Years since quitting: 16.8   Smokeless tobacco: Former    Types: Nurse, children's Use: Never used  Substance and Sexual Activity   Alcohol use: Yes    Comment: rare   Drug use: No   Sexual activity: Not on file  Other Topics Concern   Not on file  Social History Narrative   ** Merged History Encounter **       Lives with wife in a one story home.  Has 6  children.  Retired Nature conservation officer.  Education: college.   Social Determinants of Health   Financial Resource Strain: Not on file  Food Insecurity: Not on file  Transportation Needs: Not on file  Physical Activity: Not on file  Stress: Not on file  Social Connections: Not on file     Family History: The patient's family history includes Asthma in his sister; Cancer in his father and mother; Colon cancer in his mother; Emphysema in an other family member; Esophageal cancer in his father; Hypertension in his mother; Kidney cancer in his mother; Stroke in his father. ROS:   Please see the history of present illness.    All 14 point review of systems negative except as described per history of present illness  EKGs/Labs/Other Studies Reviewed:      Recent Labs: 09/21/2021: TSH 3.218 10/17/2021: ALT 18; BUN 46; Creatinine, Ser 2.21; Hemoglobin 9.7; NT-Pro BNP 15,794; Platelets 114; Potassium 4.3; Sodium 139  Recent Lipid Panel No results found for: CHOL, TRIG, HDL, CHOLHDL, VLDL, LDLCALC, LDLDIRECT  Physical Exam:    VS:  BP (!) 114/52   Pulse 76   Ht 6' (1.829 m)   Wt 216 lb (98 kg)   SpO2 96%   BMI 29.29 kg/m     Wt Readings from Last 3 Encounters:  11/01/21 216 lb (98 kg)  10/27/21 214 lb 12 oz (97.4 kg)  10/23/21 221 lb (100.2 kg)     GEN:  Well nourished, well developed in no acute distress HEENT: Normal NECK: No JVD; No carotid bruits LYMPHATICS: No lymphadenopathy CARDIAC: RRR, no murmurs, no rubs, no gallops RESPIRATORY:  Clear to auscultation without rales, wheezing or rhonchi  ABDOMEN: Soft, non-tender, non-distended MUSCULOSKELETAL:  No edema; No deformity  SKIN: Warm and dry LOWER EXTREMITIES: no swelling NEUROLOGIC:  Alert and oriented x 3 PSYCHIATRIC:  Normal affect   ASSESSMENT:  1. CHF (congestive heart failure), NYHA class II, chronic, diastolic (HCC)   2. Paroxysmal atrial fibrillation ([place])   3. Peripheral vascular disease, unspecified ([place])   4.  Coronary artery disease involving native coronary artery of native heart without angina pectoris   5. Dyslipidemia    PLAN:    In order of problems listed above:  Congestive heart failure seems to be compensated today on appropriate medications which I will continue. Paroxysmal atrial fibrillation he is anticoagulated but there is history of bleeding as well as anemia requiring frequent iron infusions.  He did have discussion with our EP team about potential implantable watchman device.  He is willing to proceed.  We are awaiting appointment to discuss that in details. Peripheral vascular disease appears to be stable Coronary disease no recent problems.   Medication Adjustments/Labs and Tests Ordered: Current medicines are reviewed at length with the patient today.  Concerns regarding medicines are outlined above.  No orders of the defined types were placed in this encounter.  Medication changes: No orders of the defined types were placed in this encounter.   Signed, Park Liter, MD, [facility] 11/01/2021 10:49 AM    [place]

## 2021-11-01 NOTE — Patient Instructions (Signed)
Medication Instructions:  Your physician recommends that you continue on your current medications as directed. Please refer to the Current Medication list given to you today.  *If you need a refill on your cardiac medications before your next appointment, please call your pharmacy*   Lab Work: None ordered If you have labs (blood work) drawn today and your tests are completely normal, you will receive your results only by: [place] (if you have MyChart) OR A paper copy in the mail If you have any lab test that is abnormal or we need to change your treatment, we will call you to review the results.   Testing/Procedures: None ordered   Follow-Up: At [facility], you and your health needs are our priority.  As part of our continuing mission to provide you with exceptional heart care, we have created designated Provider Care Teams.  These Care Teams include your primary Cardiologist (physician) and Advanced Practice Providers (APPs -  Physician Assistants and Nurse Practitioners) who all work together to provide you with the care you need, when you need it.  We recommend signing up for the patient portal called "MyChart".  Sign up information is provided on this After Visit Summary.  MyChart is used to connect with patients for Virtual Visits (Telemedicine).  Patients are able to view lab/test results, encounter notes, upcoming appointments, etc.  Non-urgent messages can be sent to your provider as well.   To learn more about what you can do with MyChart, go to [email or web address].    Your next appointment:   2 month(s)  The format for your next appointment:   In Person  Provider:   [name], MD   Other Instructions NA

## 2021-11-02 ENCOUNTER — Encounter: Payer: Self-pay | Admitting: Oncology

## 2021-11-08 DIAGNOSIS — N400 Enlarged prostate without lower urinary tract symptoms: Secondary | ICD-10-CM | POA: Diagnosis not present

## 2021-11-11 ENCOUNTER — Encounter: Payer: Self-pay | Admitting: Cardiology

## 2021-11-21 NOTE — Telephone Encounter (Signed)
Informed wife that I am not sure why referral to Dr. Quentin Ore for [facility] consult has not been scheduled yet. Aware I will forward to our [facility] scheduler to arrange consult. Wife is agreeable to plan

## 2021-11-22 ENCOUNTER — Telehealth: Payer: Self-pay

## 2021-11-22 NOTE — Telephone Encounter (Signed)
Per Dr. Curt Bears, scheduled the patient for [facility] consult with Dr. Quentin Ore on 12/29. The patient's wife was grateful for call and agrees with plan.

## 2021-11-22 NOTE — Telephone Encounter (Signed)
Alycia Rossetti!   Will you call this man to schedule with [facility] for watchman?

## 2021-11-23 DIAGNOSIS — I129 Hypertensive chronic kidney disease with stage 1 through stage 4 chronic kidney disease, or unspecified chronic kidney disease: Secondary | ICD-10-CM | POA: Diagnosis not present

## 2021-11-23 DIAGNOSIS — N179 Acute kidney failure, unspecified: Secondary | ICD-10-CM | POA: Diagnosis present

## 2021-11-23 DIAGNOSIS — E1122 Type 2 diabetes mellitus with diabetic chronic kidney disease: Secondary | ICD-10-CM | POA: Diagnosis present

## 2021-11-23 DIAGNOSIS — Z950 Presence of cardiac pacemaker: Secondary | ICD-10-CM | POA: Diagnosis not present

## 2021-11-23 DIAGNOSIS — I493 Ventricular premature depolarization: Secondary | ICD-10-CM | POA: Diagnosis not present

## 2021-11-23 DIAGNOSIS — E876 Hypokalemia: Secondary | ICD-10-CM | POA: Diagnosis present

## 2021-11-23 DIAGNOSIS — R531 Weakness: Secondary | ICD-10-CM | POA: Diagnosis present

## 2021-11-23 DIAGNOSIS — R1310 Dysphagia, unspecified: Secondary | ICD-10-CM | POA: Diagnosis present

## 2021-11-23 DIAGNOSIS — E860 Dehydration: Secondary | ICD-10-CM | POA: Diagnosis present

## 2021-11-23 DIAGNOSIS — Z20822 Contact with and (suspected) exposure to covid-19: Secondary | ICD-10-CM | POA: Diagnosis present

## 2021-11-23 DIAGNOSIS — M1990 Unspecified osteoarthritis, unspecified site: Secondary | ICD-10-CM | POA: Diagnosis present

## 2021-11-23 DIAGNOSIS — R945 Abnormal results of liver function studies: Secondary | ICD-10-CM | POA: Diagnosis present

## 2021-11-23 DIAGNOSIS — M109 Gout, unspecified: Secondary | ICD-10-CM | POA: Diagnosis present

## 2021-11-23 DIAGNOSIS — G8929 Other chronic pain: Secondary | ICD-10-CM | POA: Diagnosis present

## 2021-11-23 DIAGNOSIS — G4733 Obstructive sleep apnea (adult) (pediatric): Secondary | ICD-10-CM | POA: Diagnosis present

## 2021-11-23 DIAGNOSIS — R059 Cough, unspecified: Secondary | ICD-10-CM | POA: Diagnosis not present

## 2021-11-23 DIAGNOSIS — K7460 Unspecified cirrhosis of liver: Secondary | ICD-10-CM | POA: Diagnosis present

## 2021-11-23 DIAGNOSIS — R2689 Other abnormalities of gait and mobility: Secondary | ICD-10-CM | POA: Diagnosis present

## 2021-11-23 DIAGNOSIS — K5520 Angiodysplasia of colon without hemorrhage: Secondary | ICD-10-CM | POA: Diagnosis not present

## 2021-11-23 DIAGNOSIS — I130 Hypertensive heart and chronic kidney disease with heart failure and stage 1 through stage 4 chronic kidney disease, or unspecified chronic kidney disease: Secondary | ICD-10-CM | POA: Diagnosis present

## 2021-11-23 DIAGNOSIS — R4182 Altered mental status, unspecified: Secondary | ICD-10-CM | POA: Diagnosis not present

## 2021-11-23 DIAGNOSIS — I495 Sick sinus syndrome: Secondary | ICD-10-CM | POA: Diagnosis not present

## 2021-11-23 DIAGNOSIS — D509 Iron deficiency anemia, unspecified: Secondary | ICD-10-CM | POA: Diagnosis present

## 2021-11-23 DIAGNOSIS — J449 Chronic obstructive pulmonary disease, unspecified: Secondary | ICD-10-CM | POA: Diagnosis not present

## 2021-11-23 DIAGNOSIS — R410 Disorientation, unspecified: Secondary | ICD-10-CM | POA: Diagnosis not present

## 2021-11-23 DIAGNOSIS — E7800 Pure hypercholesterolemia, unspecified: Secondary | ICD-10-CM | POA: Diagnosis present

## 2021-11-23 DIAGNOSIS — I480 Paroxysmal atrial fibrillation: Secondary | ICD-10-CM | POA: Diagnosis present

## 2021-11-23 DIAGNOSIS — N183 Chronic kidney disease, stage 3 unspecified: Secondary | ICD-10-CM | POA: Diagnosis present

## 2021-11-23 DIAGNOSIS — G9341 Metabolic encephalopathy: Secondary | ICD-10-CM | POA: Diagnosis present

## 2021-11-23 DIAGNOSIS — I4892 Unspecified atrial flutter: Secondary | ICD-10-CM | POA: Diagnosis not present

## 2021-11-23 DIAGNOSIS — T426X5A Adverse effect of other antiepileptic and sedative-hypnotic drugs, initial encounter: Secondary | ICD-10-CM | POA: Diagnosis present

## 2021-11-23 DIAGNOSIS — I2510 Atherosclerotic heart disease of native coronary artery without angina pectoris: Secondary | ICD-10-CM | POA: Diagnosis present

## 2021-11-23 DIAGNOSIS — N189 Chronic kidney disease, unspecified: Secondary | ICD-10-CM | POA: Diagnosis not present

## 2021-11-24 ENCOUNTER — Ambulatory Visit (INDEPENDENT_AMBULATORY_CARE_PROVIDER_SITE_OTHER): Payer: Medicare Other

## 2021-11-24 DIAGNOSIS — I495 Sick sinus syndrome: Secondary | ICD-10-CM | POA: Diagnosis not present

## 2021-11-27 ENCOUNTER — Telehealth: Payer: Self-pay | Admitting: Gastroenterology

## 2021-11-27 DIAGNOSIS — D519 Vitamin B12 deficiency anemia, unspecified: Secondary | ICD-10-CM | POA: Diagnosis not present

## 2021-11-27 DIAGNOSIS — Z6833 Body mass index (BMI) 33.0-33.9, adult: Secondary | ICD-10-CM | POA: Diagnosis not present

## 2021-11-27 DIAGNOSIS — D638 Anemia in other chronic diseases classified elsewhere: Secondary | ICD-10-CM | POA: Diagnosis not present

## 2021-11-27 DIAGNOSIS — I495 Sick sinus syndrome: Secondary | ICD-10-CM

## 2021-11-27 DIAGNOSIS — R5383 Other fatigue: Secondary | ICD-10-CM | POA: Diagnosis not present

## 2021-11-27 DIAGNOSIS — G4733 Obstructive sleep apnea (adult) (pediatric): Secondary | ICD-10-CM | POA: Diagnosis not present

## 2021-11-27 DIAGNOSIS — R600 Localized edema: Secondary | ICD-10-CM | POA: Diagnosis not present

## 2021-11-27 DIAGNOSIS — I509 Heart failure, unspecified: Secondary | ICD-10-CM | POA: Diagnosis not present

## 2021-11-27 DIAGNOSIS — I10 Essential (primary) hypertension: Secondary | ICD-10-CM | POA: Diagnosis not present

## 2021-11-27 DIAGNOSIS — G4700 Insomnia, unspecified: Secondary | ICD-10-CM | POA: Diagnosis not present

## 2021-11-27 LAB — CUP PACEART REMOTE DEVICE CHECK
Battery Remaining Longevity: 52 mo
Battery Remaining Percentage: 54 %
Battery Voltage: 2.99 V
Brady Statistic AP VP Percent: 71 %
Brady Statistic AP VS Percent: 20 %
Brady Statistic AS VP Percent: 5.8 %
Brady Statistic AS VS Percent: 3.7 %
Brady Statistic RA Percent Paced: 86 %
Brady Statistic RV Percent Paced: 77 %
Date Time Interrogation Session: 20221203175009
Implantable Lead Implant Date: 20180830
Implantable Lead Implant Date: 20180830
Implantable Lead Location: 753859
Implantable Lead Location: 753860
Implantable Pulse Generator Implant Date: 20180830
Lead Channel Impedance Value: 380 Ohm
Lead Channel Impedance Value: 380 Ohm
Lead Channel Pacing Threshold Amplitude: 1 V
Lead Channel Pacing Threshold Amplitude: 1.25 V
Lead Channel Pacing Threshold Pulse Width: 0.5 ms
Lead Channel Pacing Threshold Pulse Width: 0.5 ms
Lead Channel Sensing Intrinsic Amplitude: 1.8 mV
Lead Channel Sensing Intrinsic Amplitude: 7.2 mV
Lead Channel Setting Pacing Amplitude: 2.25 V
Lead Channel Setting Pacing Amplitude: 2.5 V
Lead Channel Setting Pacing Pulse Width: 0.5 ms
Lead Channel Setting Sensing Sensitivity: 2 mV
Pulse Gen Model: 2272
Pulse Gen Serial Number: 8939815

## 2021-11-27 NOTE — Telephone Encounter (Signed)
Left message for pt to call back  °

## 2021-11-28 NOTE — Telephone Encounter (Signed)
Spoke with pt wife Braxen Dobek. Pt wife stated that the Pt was recently in the ED and the ER dr stated that the pt needed to have an Barium Swallow test done. Pt spouse stated that he wanted Dr. Lyndel Safe to do the test. Pt was notified that Dr. Lyndel Safe does not actually do the Barium Swallow test and that he just orders it.Pt wife stated that they will just reach out to there PCP to get it ordered.  Pt wife verbalized understanding with all questions answered.

## 2021-11-28 NOTE — Telephone Encounter (Signed)
Left message for pt to call back  °

## 2021-11-28 NOTE — Telephone Encounter (Signed)
Patient's wife called you back and asked that you call her when you can.  Thank you.

## 2021-12-01 DIAGNOSIS — E441 Mild protein-calorie malnutrition: Secondary | ICD-10-CM | POA: Diagnosis not present

## 2021-12-01 DIAGNOSIS — E1122 Type 2 diabetes mellitus with diabetic chronic kidney disease: Secondary | ICD-10-CM | POA: Diagnosis not present

## 2021-12-01 DIAGNOSIS — G4733 Obstructive sleep apnea (adult) (pediatric): Secondary | ICD-10-CM | POA: Diagnosis not present

## 2021-12-01 DIAGNOSIS — M109 Gout, unspecified: Secondary | ICD-10-CM | POA: Diagnosis not present

## 2021-12-01 DIAGNOSIS — Z8719 Personal history of other diseases of the digestive system: Secondary | ICD-10-CM | POA: Diagnosis not present

## 2021-12-01 DIAGNOSIS — K7460 Unspecified cirrhosis of liver: Secondary | ICD-10-CM | POA: Diagnosis not present

## 2021-12-01 DIAGNOSIS — I130 Hypertensive heart and chronic kidney disease with heart failure and stage 1 through stage 4 chronic kidney disease, or unspecified chronic kidney disease: Secondary | ICD-10-CM | POA: Diagnosis not present

## 2021-12-01 DIAGNOSIS — Z87891 Personal history of nicotine dependence: Secondary | ICD-10-CM | POA: Diagnosis not present

## 2021-12-01 DIAGNOSIS — N400 Enlarged prostate without lower urinary tract symptoms: Secondary | ICD-10-CM | POA: Diagnosis not present

## 2021-12-01 DIAGNOSIS — I4891 Unspecified atrial fibrillation: Secondary | ICD-10-CM | POA: Diagnosis not present

## 2021-12-01 DIAGNOSIS — M1990 Unspecified osteoarthritis, unspecified site: Secondary | ICD-10-CM | POA: Diagnosis not present

## 2021-12-01 DIAGNOSIS — K7581 Nonalcoholic steatohepatitis (NASH): Secondary | ICD-10-CM | POA: Diagnosis not present

## 2021-12-01 DIAGNOSIS — I5032 Chronic diastolic (congestive) heart failure: Secondary | ICD-10-CM | POA: Diagnosis not present

## 2021-12-01 DIAGNOSIS — N1832 Chronic kidney disease, stage 3b: Secondary | ICD-10-CM | POA: Diagnosis not present

## 2021-12-01 DIAGNOSIS — K7210 Chronic hepatic failure without coma: Secondary | ICD-10-CM | POA: Diagnosis not present

## 2021-12-01 DIAGNOSIS — I2510 Atherosclerotic heart disease of native coronary artery without angina pectoris: Secondary | ICD-10-CM | POA: Diagnosis not present

## 2021-12-01 DIAGNOSIS — M810 Age-related osteoporosis without current pathological fracture: Secondary | ICD-10-CM | POA: Diagnosis not present

## 2021-12-01 DIAGNOSIS — Z9989 Dependence on other enabling machines and devices: Secondary | ICD-10-CM | POA: Diagnosis not present

## 2021-12-01 DIAGNOSIS — E1151 Type 2 diabetes mellitus with diabetic peripheral angiopathy without gangrene: Secondary | ICD-10-CM | POA: Diagnosis not present

## 2021-12-03 DIAGNOSIS — I130 Hypertensive heart and chronic kidney disease with heart failure and stage 1 through stage 4 chronic kidney disease, or unspecified chronic kidney disease: Secondary | ICD-10-CM | POA: Diagnosis not present

## 2021-12-03 DIAGNOSIS — K7581 Nonalcoholic steatohepatitis (NASH): Secondary | ICD-10-CM | POA: Diagnosis not present

## 2021-12-03 DIAGNOSIS — K7210 Chronic hepatic failure without coma: Secondary | ICD-10-CM | POA: Diagnosis not present

## 2021-12-03 DIAGNOSIS — E1122 Type 2 diabetes mellitus with diabetic chronic kidney disease: Secondary | ICD-10-CM | POA: Diagnosis not present

## 2021-12-03 DIAGNOSIS — I5032 Chronic diastolic (congestive) heart failure: Secondary | ICD-10-CM | POA: Diagnosis not present

## 2021-12-03 DIAGNOSIS — K7460 Unspecified cirrhosis of liver: Secondary | ICD-10-CM | POA: Diagnosis not present

## 2021-12-04 ENCOUNTER — Other Ambulatory Visit: Payer: Self-pay | Admitting: Pharmacist

## 2021-12-04 DIAGNOSIS — K7581 Nonalcoholic steatohepatitis (NASH): Secondary | ICD-10-CM | POA: Diagnosis not present

## 2021-12-04 DIAGNOSIS — K7210 Chronic hepatic failure without coma: Secondary | ICD-10-CM | POA: Diagnosis not present

## 2021-12-04 DIAGNOSIS — I5032 Chronic diastolic (congestive) heart failure: Secondary | ICD-10-CM | POA: Diagnosis not present

## 2021-12-04 DIAGNOSIS — I130 Hypertensive heart and chronic kidney disease with heart failure and stage 1 through stage 4 chronic kidney disease, or unspecified chronic kidney disease: Secondary | ICD-10-CM | POA: Diagnosis not present

## 2021-12-04 DIAGNOSIS — K7460 Unspecified cirrhosis of liver: Secondary | ICD-10-CM | POA: Diagnosis not present

## 2021-12-04 DIAGNOSIS — E1122 Type 2 diabetes mellitus with diabetic chronic kidney disease: Secondary | ICD-10-CM | POA: Diagnosis not present

## 2021-12-05 DIAGNOSIS — K7210 Chronic hepatic failure without coma: Secondary | ICD-10-CM | POA: Diagnosis not present

## 2021-12-05 DIAGNOSIS — K7460 Unspecified cirrhosis of liver: Secondary | ICD-10-CM | POA: Diagnosis not present

## 2021-12-05 DIAGNOSIS — I130 Hypertensive heart and chronic kidney disease with heart failure and stage 1 through stage 4 chronic kidney disease, or unspecified chronic kidney disease: Secondary | ICD-10-CM | POA: Diagnosis not present

## 2021-12-05 DIAGNOSIS — E1122 Type 2 diabetes mellitus with diabetic chronic kidney disease: Secondary | ICD-10-CM | POA: Diagnosis not present

## 2021-12-05 DIAGNOSIS — K7581 Nonalcoholic steatohepatitis (NASH): Secondary | ICD-10-CM | POA: Diagnosis not present

## 2021-12-05 DIAGNOSIS — I5032 Chronic diastolic (congestive) heart failure: Secondary | ICD-10-CM | POA: Diagnosis not present

## 2021-12-05 NOTE — Progress Notes (Signed)
Remote pacemaker transmission.   

## 2021-12-06 DIAGNOSIS — I5032 Chronic diastolic (congestive) heart failure: Secondary | ICD-10-CM | POA: Diagnosis not present

## 2021-12-06 DIAGNOSIS — K7460 Unspecified cirrhosis of liver: Secondary | ICD-10-CM | POA: Diagnosis not present

## 2021-12-06 DIAGNOSIS — E1122 Type 2 diabetes mellitus with diabetic chronic kidney disease: Secondary | ICD-10-CM | POA: Diagnosis not present

## 2021-12-06 DIAGNOSIS — K7581 Nonalcoholic steatohepatitis (NASH): Secondary | ICD-10-CM | POA: Diagnosis not present

## 2021-12-06 DIAGNOSIS — K7210 Chronic hepatic failure without coma: Secondary | ICD-10-CM | POA: Diagnosis not present

## 2021-12-06 DIAGNOSIS — I130 Hypertensive heart and chronic kidney disease with heart failure and stage 1 through stage 4 chronic kidney disease, or unspecified chronic kidney disease: Secondary | ICD-10-CM | POA: Diagnosis not present

## 2021-12-07 DIAGNOSIS — E1122 Type 2 diabetes mellitus with diabetic chronic kidney disease: Secondary | ICD-10-CM | POA: Diagnosis not present

## 2021-12-07 DIAGNOSIS — I130 Hypertensive heart and chronic kidney disease with heart failure and stage 1 through stage 4 chronic kidney disease, or unspecified chronic kidney disease: Secondary | ICD-10-CM | POA: Diagnosis not present

## 2021-12-07 DIAGNOSIS — K7581 Nonalcoholic steatohepatitis (NASH): Secondary | ICD-10-CM | POA: Diagnosis not present

## 2021-12-07 DIAGNOSIS — K7210 Chronic hepatic failure without coma: Secondary | ICD-10-CM | POA: Diagnosis not present

## 2021-12-07 DIAGNOSIS — K7460 Unspecified cirrhosis of liver: Secondary | ICD-10-CM | POA: Diagnosis not present

## 2021-12-07 DIAGNOSIS — I5032 Chronic diastolic (congestive) heart failure: Secondary | ICD-10-CM | POA: Diagnosis not present

## 2021-12-08 DIAGNOSIS — K7210 Chronic hepatic failure without coma: Secondary | ICD-10-CM | POA: Diagnosis not present

## 2021-12-08 DIAGNOSIS — E1122 Type 2 diabetes mellitus with diabetic chronic kidney disease: Secondary | ICD-10-CM | POA: Diagnosis not present

## 2021-12-08 DIAGNOSIS — I130 Hypertensive heart and chronic kidney disease with heart failure and stage 1 through stage 4 chronic kidney disease, or unspecified chronic kidney disease: Secondary | ICD-10-CM | POA: Diagnosis not present

## 2021-12-08 DIAGNOSIS — K7581 Nonalcoholic steatohepatitis (NASH): Secondary | ICD-10-CM | POA: Diagnosis not present

## 2021-12-08 DIAGNOSIS — K7460 Unspecified cirrhosis of liver: Secondary | ICD-10-CM | POA: Diagnosis not present

## 2021-12-08 DIAGNOSIS — I5032 Chronic diastolic (congestive) heart failure: Secondary | ICD-10-CM | POA: Diagnosis not present

## 2021-12-09 DIAGNOSIS — E1122 Type 2 diabetes mellitus with diabetic chronic kidney disease: Secondary | ICD-10-CM | POA: Diagnosis not present

## 2021-12-09 DIAGNOSIS — K7210 Chronic hepatic failure without coma: Secondary | ICD-10-CM | POA: Diagnosis not present

## 2021-12-09 DIAGNOSIS — I5032 Chronic diastolic (congestive) heart failure: Secondary | ICD-10-CM | POA: Diagnosis not present

## 2021-12-09 DIAGNOSIS — K7460 Unspecified cirrhosis of liver: Secondary | ICD-10-CM | POA: Diagnosis not present

## 2021-12-09 DIAGNOSIS — I130 Hypertensive heart and chronic kidney disease with heart failure and stage 1 through stage 4 chronic kidney disease, or unspecified chronic kidney disease: Secondary | ICD-10-CM | POA: Diagnosis not present

## 2021-12-09 DIAGNOSIS — K7581 Nonalcoholic steatohepatitis (NASH): Secondary | ICD-10-CM | POA: Diagnosis not present

## 2021-12-10 DIAGNOSIS — E1122 Type 2 diabetes mellitus with diabetic chronic kidney disease: Secondary | ICD-10-CM | POA: Diagnosis not present

## 2021-12-10 DIAGNOSIS — I5032 Chronic diastolic (congestive) heart failure: Secondary | ICD-10-CM | POA: Diagnosis not present

## 2021-12-10 DIAGNOSIS — I130 Hypertensive heart and chronic kidney disease with heart failure and stage 1 through stage 4 chronic kidney disease, or unspecified chronic kidney disease: Secondary | ICD-10-CM | POA: Diagnosis not present

## 2021-12-10 DIAGNOSIS — K7581 Nonalcoholic steatohepatitis (NASH): Secondary | ICD-10-CM | POA: Diagnosis not present

## 2021-12-10 DIAGNOSIS — K7460 Unspecified cirrhosis of liver: Secondary | ICD-10-CM | POA: Diagnosis not present

## 2021-12-10 DIAGNOSIS — K7210 Chronic hepatic failure without coma: Secondary | ICD-10-CM | POA: Diagnosis not present

## 2021-12-11 DIAGNOSIS — I130 Hypertensive heart and chronic kidney disease with heart failure and stage 1 through stage 4 chronic kidney disease, or unspecified chronic kidney disease: Secondary | ICD-10-CM | POA: Diagnosis not present

## 2021-12-11 DIAGNOSIS — E1122 Type 2 diabetes mellitus with diabetic chronic kidney disease: Secondary | ICD-10-CM | POA: Diagnosis not present

## 2021-12-11 DIAGNOSIS — K7210 Chronic hepatic failure without coma: Secondary | ICD-10-CM | POA: Diagnosis not present

## 2021-12-11 DIAGNOSIS — I5032 Chronic diastolic (congestive) heart failure: Secondary | ICD-10-CM | POA: Diagnosis not present

## 2021-12-11 DIAGNOSIS — K7581 Nonalcoholic steatohepatitis (NASH): Secondary | ICD-10-CM | POA: Diagnosis not present

## 2021-12-11 DIAGNOSIS — K7460 Unspecified cirrhosis of liver: Secondary | ICD-10-CM | POA: Diagnosis not present

## 2021-12-21 ENCOUNTER — Ambulatory Visit: Payer: Medicare Other | Admitting: Cardiology

## 2021-12-24 DEATH — deceased

## 2022-01-01 ENCOUNTER — Ambulatory Visit: Payer: Medicare Other | Admitting: Cardiology

## 2022-01-02 ENCOUNTER — Ambulatory Visit: Payer: Medicare Other | Admitting: Oncology

## 2022-01-02 ENCOUNTER — Other Ambulatory Visit: Payer: Medicare Other
